# Patient Record
Sex: Female | Born: 1945 | Race: White | Hispanic: No | Smoking: Never smoker
Health system: Southern US, Community
[De-identification: ages and names within clinical notes are randomized; demographics above are authoritative.]

## PROBLEM LIST (undated history)

## (undated) DIAGNOSIS — M545 Low back pain, unspecified: Secondary | ICD-10-CM

## (undated) DIAGNOSIS — K219 Gastro-esophageal reflux disease without esophagitis: Secondary | ICD-10-CM

## (undated) DIAGNOSIS — M1990 Unspecified osteoarthritis, unspecified site: Secondary | ICD-10-CM

## (undated) DIAGNOSIS — F419 Anxiety disorder, unspecified: Secondary | ICD-10-CM

## (undated) DIAGNOSIS — F32A Depression, unspecified: Secondary | ICD-10-CM

## (undated) DIAGNOSIS — I499 Cardiac arrhythmia, unspecified: Secondary | ICD-10-CM

## (undated) DIAGNOSIS — G8929 Other chronic pain: Secondary | ICD-10-CM

## (undated) DIAGNOSIS — J189 Pneumonia, unspecified organism: Secondary | ICD-10-CM

## (undated) DIAGNOSIS — Z8489 Family history of other specified conditions: Secondary | ICD-10-CM

## (undated) DIAGNOSIS — E039 Hypothyroidism, unspecified: Secondary | ICD-10-CM

## (undated) DIAGNOSIS — I4891 Unspecified atrial fibrillation: Secondary | ICD-10-CM

## (undated) DIAGNOSIS — Z8719 Personal history of other diseases of the digestive system: Secondary | ICD-10-CM

## (undated) DIAGNOSIS — Z8711 Personal history of peptic ulcer disease: Secondary | ICD-10-CM

## (undated) DIAGNOSIS — I2510 Atherosclerotic heart disease of native coronary artery without angina pectoris: Secondary | ICD-10-CM

## (undated) DIAGNOSIS — R0600 Dyspnea, unspecified: Secondary | ICD-10-CM

## (undated) DIAGNOSIS — I10 Essential (primary) hypertension: Secondary | ICD-10-CM

## (undated) DIAGNOSIS — J42 Unspecified chronic bronchitis: Secondary | ICD-10-CM

## (undated) DIAGNOSIS — G43909 Migraine, unspecified, not intractable, without status migrainosus: Secondary | ICD-10-CM

## (undated) DIAGNOSIS — F329 Major depressive disorder, single episode, unspecified: Secondary | ICD-10-CM

## (undated) DIAGNOSIS — E7800 Pure hypercholesterolemia, unspecified: Secondary | ICD-10-CM

## (undated) HISTORY — PX: CARPAL TUNNEL RELEASE: SHX101

## (undated) HISTORY — PX: TUBAL LIGATION: SHX77

## (undated) HISTORY — PX: TONSILLECTOMY: SUR1361

## (undated) HISTORY — PX: KNEE ARTHROSCOPY: SHX127

## (undated) HISTORY — PX: ANTERIOR CERVICAL DECOMP/DISCECTOMY FUSION: SHX1161

## (undated) HISTORY — PX: LUMBAR DISC SURGERY: SHX700

## (undated) HISTORY — PX: BACK SURGERY: SHX140

## (undated) HISTORY — PX: BLEPHAROPLASTY: SUR158

## (undated) HISTORY — PX: CATARACT EXTRACTION, BILATERAL: SHX1313

---

## 2004-10-30 ENCOUNTER — Ambulatory Visit (HOSPITAL_COMMUNITY): Admission: RE | Admit: 2004-10-30 | Discharge: 2004-10-31 | Payer: Self-pay | Admitting: Neurosurgery

## 2005-01-31 ENCOUNTER — Encounter: Admission: RE | Admit: 2005-01-31 | Discharge: 2005-01-31 | Payer: Self-pay | Admitting: Neurosurgery

## 2005-02-23 ENCOUNTER — Encounter: Payer: Self-pay | Admitting: Neurosurgery

## 2005-02-23 ENCOUNTER — Ambulatory Visit (HOSPITAL_COMMUNITY): Admission: RE | Admit: 2005-02-23 | Discharge: 2005-02-24 | Payer: Self-pay | Admitting: Neurosurgery

## 2005-06-07 ENCOUNTER — Encounter: Admission: RE | Admit: 2005-06-07 | Discharge: 2005-06-07 | Payer: Self-pay | Admitting: Neurosurgery

## 2005-10-23 ENCOUNTER — Encounter: Admission: RE | Admit: 2005-10-23 | Discharge: 2005-10-23 | Payer: Self-pay | Admitting: Neurosurgery

## 2006-05-31 ENCOUNTER — Encounter: Admission: RE | Admit: 2006-05-31 | Discharge: 2006-05-31 | Payer: Self-pay | Admitting: Neurosurgery

## 2006-09-24 ENCOUNTER — Encounter: Admission: RE | Admit: 2006-09-24 | Discharge: 2006-09-24 | Payer: Self-pay | Admitting: Neurosurgery

## 2007-09-11 ENCOUNTER — Ambulatory Visit (HOSPITAL_COMMUNITY): Admission: RE | Admit: 2007-09-11 | Discharge: 2007-09-12 | Payer: Self-pay | Admitting: Neurosurgery

## 2008-01-20 ENCOUNTER — Encounter: Admission: RE | Admit: 2008-01-20 | Discharge: 2008-01-20 | Payer: Self-pay | Admitting: Neurosurgery

## 2010-07-04 NOTE — Op Note (Signed)
NAMEKAPRICE, KAGE                 ACCOUNT NO.:  [id number]   MEDICAL RECORD NO.:  [id number]          PATIENT TYPE:  INP   LOCATION:  3534                         FACILITY:  [facility]   PHYSICIAN:  Donalee Citrin, M.D.        DATE OF BIRTH:  10-29-1945   DATE OF PROCEDURE:  09/11/2007  DATE OF DISCHARGE:                               OPERATIVE REPORT   PREOPERATIVE DIAGNOSES:  Cervical spondylosis with spondylotic  radiculopathy C6-C7, right greater than left.   PROCEDURE:  Anterior cervical diskectomies and fusion at C5-C6, C6-C7  using 8-mm allograft wedge at C5-C6 and a 7-mm allograft wedge at C6-C7,  and a 40-mm Wenger plate with six 84-XL variable angle screws.   SURGEON:  Donalee Citrin, MD   ANESTHESIA:  General endotracheal.   INDICATIONS FOR PROCEDURE:  The patient is a very pleasant 65 year old  female who has had progressive worsening neck and predominantly right  arm and triceps pain has been refractory to all forms of conservative  treatment with physical therapy, antiinflammatories, and Prednisone over  the last several weeks and months.  MRI scan shows severe spondylitic  disease with spondylitic compression in spinal cord at C5-C6 and C6-C7.  The patient failed all conservative treatment.  MRI finding on physical  exam showed the patient to have weakness in her triceps.  The patient  was recommended cervical diskectomy and fusion.  Risks and benefits of  the operation were explained to the patient.  She understood and agreed  to proceed forward.   PROCEDURE IN DETAIL:  The patient was brought to the OR, was induced  under general anesthesia and positioned supine.  The neck was placed in  slight extension.  She was placed in the 5 pounds of Halter traction.  The right side of her neck was prepped and draped in the usual sterile  fashion.  Pressure localized at the appropriate level and a curvilinear  incision was made just off the midline to the anterior border of  sternocleidomastoid.  The superficial layer of the platysma was  dissected out and divided longitudinally.  The avascular plane at the  sternocleidomastoid and strap muscle was developed down to the  prevertebral fascia.  The prevertebral fascia was then dissected with  the Kitners.  Intraoperative x-ray confirmed localization at the  appropriate level.  There was a very large anterior osteophyte  immediately identified at the C5-C6 and C6-C7.  These  were bitten off  with a Leksell rongeur to contour the anterior margin of the vertebral  bodies.  Then, annulotomy with a 15-mm scalpel to mark the disk spaces,  longus colli was reflected laterally, and self-retaining retractor was  placed.  At this point, the anterior osteophytes were further bitten off  with the tooth,  high drill was used to drill down both interspaces  through the posterior annulus and osteophytic complex.  First, working  at C6-C7 under microscopic illumination, the posterior aspect was  further drilled down to the posterior annulus and longitudinal ligaments  using 1-mm Kerrison punch at undersurface of both C6 and C7.  The  vertebral bodies were underbitten further identifying the PLL, which was  teased away with a black nerve hook and removed in piecemeal fashion  with 1- and 2-mm Kerrison punch.  The thecal sac was then marked at  stenosis from a large posterior osteophyte displacing the spinal cord.  These were all very carefully underbitten across with both on the  inferior endplate of C6 and superior endplate of C7 marching across to  both C7 pedicle.  Both of these pedicles were then palpated.  Both the  C6-C7 neuroforamen were decompressed and both C7 nerve roots were widely  patent at the end of diskectomy.  After adequate decompression of the  endplates and underbiting had been achieved, the wound was packed with  Gelfoam and attention taken to C5-6.  In a similar fashion, the disk  space was drilled down to  the posterior annulus and again another  __________osteophyte was encountered in the posterior aspect of C6  vertebral body as well as C5 was then debrided aggressively.  The PLL  was removed in a piecemeal fashion decompressing the central canal and  over proximal C6 nerve root.  Both of these pedicles were identified.  Neuroforamen were decompressed and scored with angled nerve hook and  noted to be widely decompressed.  Then, after copious irrigation and  excellent hemostasis being maintained.  Endplates were scraped.  A size  8 graft was inserted at C5-C6 and size 7 graft was inserted at C6-C7.  A  40-mm Wenger plate was then placed.  All 6 variable angled screws were  placed, it had excellent purchase and locking mechanism was engaged.  The wound was then copiously irrigated, meticulous hemostasis was  maintained.  __________ Vicryl.  The skin was closed with a running 4-0  subcuticular.  Benzoin and Steri-strips were applied.  The patient went  to the recovery room in stable condition.  At the end of the case,  sponge, instruments, and needle counts were correct.           ______________________________  Donalee Citrin, M.D.     GC/MEDQ  D:  09/11/2007  T:  09/12/2007  Job:  [id number]

## 2010-07-07 NOTE — Op Note (Signed)
[name], [name]                 ACCOUNT NO.:  [id number]   MEDICAL RECORD NO.:  [id number]          PATIENT TYPE:  AMB   LOCATION:  SDS                          FACILITY:  [facility]   PHYSICIAN:  Donalee Citrin, M.D.        DATE OF BIRTH:  16-Dec-1945   DATE OF PROCEDURE:  10/30/2004  DATE OF DISCHARGE:                                 OPERATIVE REPORT   PREOPERATIVE DIAGNOSIS:  Right S1 radiculopathy from large ruptured disk, L5-  S1 right.   POSTOPERATIVE DIAGNOSIS:  Right S1 radiculopathy from large ruptured disk,  L5-S1 right.   OPERATION PERFORMED:  Lumbar laminectomy with microdiskectomy, L5-S1, right  with microscopic dissection of the right S1 nerve root and microscopic  diskectomy.   SURGEON:  Donalee Citrin, M.D.   ASSISTANT:  Kathaleen Maser. Pool, M.D.   ANESTHESIA:  General endotracheal.   INDICATIONS FOR PROCEDURE:  Patient is a very pleasant 65 year old female  who has had progressively worsening back and right leg pain radiating down  the outside of the foot and bottom of foot with numbness and tingling in the  same distribution.  The patient had weakness on plantar flexion  preoperatively and imaging showed large ruptured disk with free fragment  migrating caudally behind the S1 vertebral body compressing  the right S1  nerve root.  The patient was recommended laminectomy and microdiskectomy.  The risks and benefits were extensively discussed with the patient who  agreed to proceed forward.   DESCRIPTION OF PROCEDURE:  The patient was brought to the operating room and  was induced under general endotracheal anesthesia, placed prone on the  Wilson frame.  Back was prepped and draped in the usual sterile fashion.  Preop x-ray localized the L5-S1 disk space.  After infiltration of 10 mL  lidocaine with epinephrine, a midline incision was made.  Bovie  electrocautery was used to take down subcutaneous tissue.  Subperiosteal  dissection carried out lamina of L5 and S1 on the right  side.  Intraoperative x-ray confirmed localization of L5-S1 disk space and the  inferior aspect of lamina of L5 and medial facet complex and superior aspect  of S1 was removed in piecemeal fashion with 3 mm Kerrison punch.  Then the  ligament was noted to be markedly hypertrophied.  This was also removed in  piecemeal fashion exposing the thecal sac.  Then the operating microscope  was draped and brought in the field.  Under microscopic illumination,  __________ lateral thecal sac was identified and teased away from the  lateral ligament with a 4 Penfield and a blunt nerve root.  This was then  also underbitten with a 2 and 3 mm Kerrison punch and while I was  underbiting the lateral facet complex and lateral gutter, a small synovial  cyst was identified and removed in piecemeal fashion.  Then the S1 nerve  root was identified.  Then using a blunt nerve root and a 4 Penfield, it was  teased away and off of the disk space and free fragment was immediately  expressed and teased away with a blunt  nerve hook and pituitary rongeurs.  After the free fragment was removed, the disk space was identified. Epidural  veins coagulated.  Annulotomy was extended with 11 blade scalpel.  Pituitary  rongeurs were used to adequately clean out disk space using down going  Epstein curette, upbiting pituitaries and __________  Disk space was again  cleaned out both centrally and laterally.  Then the superior and inferior  aspects of the disk space were explored. No further stenosis or fragments  were appreciated. The lateral gutter was underbitten to create more of an  area for the S1 nerve root to rest.  Then the wound was copiously irrigated  and meticulous hemostasis was maintained.  Gelfoam was overlaid atop the  dura.  Muscle and fascia reapproximated in layers with interrupted  Vicryl.  __________  4-0 subcuticular placed on the skin. Benzoin and Steri-  Strips applied.  The patient was then transferred to  the recovery room in  stable condition. At end of case all sponge, needle and instrument counts  were correct.           ______________________________  Donalee Citrin, M.D.     GC/MEDQ  D:  10/30/2004  T:  10/30/2004  Job:  [id number]

## 2010-07-07 NOTE — Op Note (Signed)
Erin [name], SILER                 ACCOUNT NO.:  [id number]   MEDICAL RECORD NO.:  [id number]          PATIENT TYPE:  OIB   LOCATION:  3007                         FACILITY:  [facility]   PHYSICIAN:  Donalee Citrin, M.D.        DATE OF BIRTH:  October 05, 1945   DATE OF PROCEDURE:  02/23/2005  DATE OF DISCHARGE:  02/24/2005                                 OPERATIVE REPORT   PREOPERATIVE DIAGNOSIS:  Recurrent S1 radiculopathy from a large ruptured  disk at L5-S1 on the right.   PROCEDURE:  Redo right-sided L5-S1 laminectomy and microdiskectomy with  microdissection of the right S1 nerve root.   SURGEON:  Donalee Citrin, M.D.   ANESTHESIA:  General endotracheal.   HISTORY OF PRESENT ILLNESS:  The patient is a very pleasant 65 year old  female who underwent lumbar laminectomy approximately three months ago and  over the last few weeks has had recurrent severe right-sided recurrent  radiculopathy down the outside __________ and bottom of the foot with  numbness and tingling in the same distribution.  Preoperative x-ray showed a  very large recurrent disk herniation causing severe spinal stenosis on the  right side at L5-S1.  The patient was recommended a reexploration and redo  laminectomy and diskectomy, the risks, benefits and limitations, she  understands and agrees to proceed forward.   The patient was brought into the OR, was induced under general anesthesia,  was positioned prone and placed on the Wilson frame.  The back was prepped  and draped in the usual sterile fashion.  No preoperative x-ray was taken.  The old incision was opened up and Bovie electrocautery was used to dissect  through the scar tissue.  Subperiosteal dissection was carried out with the  scar tissue dissected off the residual lamina of the L5 and S1 disk space.  Intraoperative x-ray confirmed localization of the probe one disk space  above this at L4-5, so attention was taken to the scar tissue at the disk  space below.  The  plane underneath the L5 lamina was developed with a 4  Penfield.  Using a 2 and 3 mm Kerrison punch, some additional L5 laminectomy  was performed to identify normal dura.  Then the lateral facet complex was  dissected free with general dissectors and a 4 Penfield as well as the  superior aspect of the S1 lamina.  Again, the S1 laminotomy was extended and  the medial facetectomy was also extended.  This exposed normal dura at the  S1 level as well as the L5 level and then the operating microscope was  draped, brought in to provide __________ illumination and the S1 pedicle was  identified and the S1 nerve was identified and dissected off the pedicle.  A  very large disk recurrent disk herniation densely stuck to the undersurface  of the dura was identified and under microscopic illumination, the plane on  the undersurface of the dura was developed over this recurrent disk  herniation and was very difficult to dissect free, so the lateral disk space  was entered with an 11 blade scalpel  and pituitary rongeurs to get  underneath the disk space, underneath the scar tissue, and several large  fragments of disk were removed.  Several free fragments were dissected off  of the undersurface of the thecal sac at this time and the disk space was  cleaned out.  The undersurface of the S1 nerve root was cleaned out at the  end of the diskectomy and some scar tissue was freed up and the S1 nerve was  easily mobilizable and the thecal sac was well-decompressed.  The wound was  then copiously irrigated and meticulous hemostasis was maintained.  Gelfoam  was overload on top  of the dura.  The muscle and fascia were reapproximated with interrupted  Vicryls and the skin was closed with a running 4-0 subcuticular and Benzoin  and Steri-Strips applied.  The patient went to the recovery room in stable  condition.  At the end of the case all needle and sponge counts were  correct.            ______________________________  Donalee Citrin, M.D.     GC/MEDQ  D:  02/23/2005  T:  02/24/2005  Job:  [id number]

## 2010-11-17 LAB — CBC
HCT: 39
Hemoglobin: 13.5
MCHC: 34.5
MCV: 97.7
Platelets: 138 — ABNORMAL LOW
RBC: 4
RDW: 13.3
WBC: 4.3

## 2010-11-17 LAB — BASIC METABOLIC PANEL
BUN: 17
CO2: 30
Calcium: 9.1
Chloride: 102
Creatinine, Ser: 0.75
GFR calc Af Amer: >60
GFR calc non Af Amer: >60
Glucose, Bld: 85
Potassium: 3.8
Sodium: 138

## 2010-11-17 LAB — DIFFERENTIAL
Basophils Absolute: 0
Basophils Relative: 1
Eosinophils Absolute: 0.3
Eosinophils Relative: 7 — ABNORMAL HIGH
Lymphocytes Relative: 34
Lymphs Abs: 1.5
Monocytes Absolute: 0.5
Monocytes Relative: 11
Neutro Abs: 2.1
Neutrophils Relative %: 48

## 2016-04-18 ENCOUNTER — Other Ambulatory Visit: Payer: Self-pay | Admitting: Neurosurgery

## 2016-04-19 HISTORY — PX: POSTERIOR LUMBAR FUSION: SHX6036

## 2016-04-23 ENCOUNTER — Encounter (HOSPITAL_COMMUNITY)
Admission: RE | Admit: 2016-04-23 | Discharge: 2016-04-23 | Disposition: A | Discharge status: Home / Self Care | Payer: Medicare HMO | Source: Ambulatory Visit | Attending: Neurosurgery | Admitting: Neurosurgery

## 2016-04-23 ENCOUNTER — Encounter (HOSPITAL_COMMUNITY): Payer: Self-pay

## 2016-04-23 ENCOUNTER — Other Ambulatory Visit (HOSPITAL_COMMUNITY): Payer: Self-pay | Admitting: *Deleted

## 2016-04-23 HISTORY — DX: Hypothyroidism, unspecified: E03.9

## 2016-04-23 HISTORY — DX: Essential (primary) hypertension: I10

## 2016-04-23 HISTORY — DX: Gastro-esophageal reflux disease without esophagitis: K21.9

## 2016-04-23 LAB — CBC
HCT: 40.9 % (ref 36.0–46.0)
Hemoglobin: 13.7 g/dL (ref 12.0–15.0)
MCH: 29.1 pg (ref 26.0–34.0)
MCHC: 33.5 g/dL (ref 30.0–36.0)
MCV: 87 fL (ref 78.0–100.0)
Platelets: 178 10*3/uL (ref 150–400)
RBC: 4.7 MIL/uL (ref 3.87–5.11)
RDW: 13.8 % (ref 11.5–15.5)
WBC: 9.7 10*3/uL (ref 4.0–10.5)

## 2016-04-23 LAB — SURGICAL PCR SCREEN
MRSA, PCR: NEGATIVE
Staphylococcus aureus: POSITIVE — AB

## 2016-04-23 LAB — BASIC METABOLIC PANEL
Anion gap: 8 (ref 5–15)
BUN: 16 mg/dL (ref 6–20)
CO2: 31 mmol/L (ref 22–32)
Calcium: 9.9 mg/dL (ref 8.9–10.3)
Chloride: 94 mmol/L — ABNORMAL LOW (ref 101–111)
Creatinine, Ser: 0.77 mg/dL (ref 0.44–1.00)
GFR calc Af Amer: >60 mL/min (ref >60)
GFR calc non Af Amer: >60 mL/min (ref >60)
Glucose, Bld: 106 mg/dL — ABNORMAL HIGH (ref 65–99)
Potassium: 3.8 mmol/L (ref 3.5–5.1)
Sodium: 133 mmol/L — ABNORMAL LOW (ref 135–145)

## 2016-04-23 LAB — TYPE AND SCREEN
ABO/RH(D): O POS
Antibody Screen: NEGATIVE

## 2016-04-23 LAB — ABO/RH: ABO/RH(D): O POS

## 2016-04-23 NOTE — Pre-Procedure Instructions (Addendum)
Erin [name]  04/23/2016    Your procedure is scheduled on Wednesday, April 25, 2016    Report to [facility] Entrance "A" Admitting Office.   Call [phone] morning of surgery  between 7-8 am for time to arrive if dr has not instructed   Call this number if you have problems the morning of surgery: [phone]   Questions prior to day of surgery, please call [phone] between 8 & 4 PM.   Remember:  Do not eat food or drink liquids after midnight Tuesday, 04/24/16.  Take these medicines the morning of surgery with A SIP OF WATER: Levothyroxine (Synthroid), Rabeprazole (Aciphex) - if needed, Hydrocodone - if needed, may use eye drops  Stop Multivitamins/supplements as of today.also Do not use Aspirin products or NSAIDS (Ibuprofen, Aleve, etc.) starting NOW   Do not wear jewelry, make-up or nail polish.  Do not wear lotions, powders, perfumes or deodorant.  Do not shave 48 hours prior to surgery.    Do not bring valuables to the hospital.  [facility] is not responsible for any belongings or valuables.  Contacts, dentures or bridgework may not be worn into surgery.  Leave your suitcase in the car.  After surgery it may be brought to your room.  For patients admitted to the hospital, discharge time will be determined by your treatment team.  Special instructions:  [place] - Preparing for Surgery  Before surgery, you can play an important role.  Because skin is not sterile, your skin needs to be as free of germs as possible.  You can reduce the number of germs on you skin by washing with CHG (chlorahexidine gluconate) soap before surgery.  CHG is an antiseptic cleaner which kills germs and bonds with the skin to continue killing germs even after washing.  Please DO NOT use if you have an allergy to CHG or antibacterial soaps.  If your skin becomes reddened/irritated stop using the CHG and inform your nurse when you arrive at Short Stay.  Do not shave (including  legs and underarms) for at least 48 hours prior to the first CHG shower.  You may shave your face.  Please follow these instructions carefully:   1.  Shower with CHG Soap the night before surgery and the                    morning of Surgery.  2.  If you choose to wash your hair, wash your hair first as usual with your       normal shampoo.  3.  After you shampoo, rinse your hair and body thoroughly to remove the shampoo.  4.  Use CHG as you would any other liquid soap.  You can apply chg directly       to the skin and wash gently with scrungie or a clean washcloth.  5.  Apply the CHG Soap to your body ONLY FROM THE NECK DOWN.        Do not use on open wounds or open sores.  Avoid contact with your eyes, ears, mouth and genitals (private parts).  Wash genitals (private parts) with your normal soap.  6.  Wash thoroughly, paying special attention to the area where your surgery        will be performed.  7.  Thoroughly rinse your body with warm water from the neck down.  8.  DO NOT shower/wash with your normal soap after using and rinsing off  the CHG Soap.  9.  Pat yourself dry with a clean towel.            10.  Wear clean pajamas.            11.  Place clean sheets on your bed the night of your first shower and do not        sleep with pets.  Day of Surgery  Do not apply any lotions/deodorants the morning of surgery.  Please wear clean clothes to the hospital.   Please read over the fact sheets that you were given.

## 2016-04-24 NOTE — Progress Notes (Signed)
Anesthesia Chart Review: Patient is a 71 year old female scheduled for L4-5, L5-S1 PLIF on 04/25/16 by Dr. Wynetta Emery.  History includes never smoker, hypertension, GERD, migraines, hypothyroidism, cervical discectomy 2009, back surgery 2007, tonsillectomy. BMI is consistent with mild obesity.   PCP is listed as Dr. Caffie Damme at [facility].  BP (!) 132/96 Comment: taken manually Purnell Shoemaker RN  Pulse 89   Temp 36.5 C   Resp 20   Ht 5\' 3"  (1.6 m)   Wt 171 lb 11.2 oz (77.9 kg)   SpO2 96%   BMI 30.42 kg/m   Meds include Fioricet, levothyroxine, magnesium, quinapril, AcipHex, Zocor, Maxide-25.  EKG 04/23/16: SR with PACs, possible left atrial enlargement, inferior infarct, age undetermined. There is no comparison tracing in [employer] or [employer]. [facility] faxed over a tracing from 06/09/10, but if is dark and lead aVF is difficult to read, otherwise I don't think the other leads have significantly changes. PACs are new.   Nuclear stress test 12/20/11 [facility]): Impression: Myocardial perfusion imaging is normal. Overall left ventricular systolic function was normal without regional wall motion abnormalities. The LVEF was 71%. Resting ECG SR, incomplete RBBB.  Echo 01/10/2012 [facility]): Conclusion: Normal study. Normal cardiac chamber sizes and function. LVEF 65-70%. Normal valve anatomy and function. No pericardial effusion or intracardiac mass. Normal thoracic aorta and aortic arch.   Preoperative labs noted. Cr 0.77. CBC WNL. Glucose 106. T&S done.  No CV symptoms documented at PAT Unremarkable cardiac testing in late 2013. No known CAD/MI/CHF history. No known DM. If no acute changes then I anticipate that she can proceed as planned. Discussed with anesthesiologist Dr. Aleene Davidson.  Velna Ochs [facility] Short Stay Center/Anesthesiology Phone [phone] 04/24/2016 2:08 PM

## 2016-04-25 ENCOUNTER — Encounter (HOSPITAL_COMMUNITY)
Admission: RE | Disposition: A | Discharge status: Home Health | Payer: Self-pay | Source: Ambulatory Visit | Attending: Neurosurgery

## 2016-04-25 ENCOUNTER — Inpatient Hospital Stay (HOSPITAL_COMMUNITY): Payer: Medicare HMO | Admitting: Vascular Surgery

## 2016-04-25 ENCOUNTER — Encounter (HOSPITAL_COMMUNITY): Payer: Self-pay | Admitting: *Deleted

## 2016-04-25 ENCOUNTER — Inpatient Hospital Stay (HOSPITAL_COMMUNITY)
Admission: RE | Admit: 2016-04-25 | Discharge: 2016-04-27 | DRG: 455 | Disposition: A | Discharge status: Home Health | Payer: Medicare HMO | Source: Ambulatory Visit | Attending: Neurosurgery | Admitting: Neurosurgery

## 2016-04-25 ENCOUNTER — Inpatient Hospital Stay (HOSPITAL_COMMUNITY): Payer: Medicare HMO | Admitting: Certified Registered"

## 2016-04-25 ENCOUNTER — Inpatient Hospital Stay (HOSPITAL_COMMUNITY): Payer: Medicare HMO

## 2016-04-25 DIAGNOSIS — K219 Gastro-esophageal reflux disease without esophagitis: Secondary | ICD-10-CM | POA: Diagnosis present

## 2016-04-25 DIAGNOSIS — M48062 Spinal stenosis, lumbar region with neurogenic claudication: Secondary | ICD-10-CM | POA: Diagnosis present

## 2016-04-25 DIAGNOSIS — R402143 Coma scale, eyes open, spontaneous, at hospital admission: Secondary | ICD-10-CM | POA: Diagnosis present

## 2016-04-25 DIAGNOSIS — M4807 Spinal stenosis, lumbosacral region: Secondary | ICD-10-CM | POA: Diagnosis present

## 2016-04-25 DIAGNOSIS — I10 Essential (primary) hypertension: Secondary | ICD-10-CM | POA: Diagnosis present

## 2016-04-25 DIAGNOSIS — M549 Dorsalgia, unspecified: Secondary | ICD-10-CM | POA: Diagnosis present

## 2016-04-25 DIAGNOSIS — R402253 Coma scale, best verbal response, oriented, at hospital admission: Secondary | ICD-10-CM | POA: Diagnosis present

## 2016-04-25 DIAGNOSIS — R402363 Coma scale, best motor response, obeys commands, at hospital admission: Secondary | ICD-10-CM | POA: Diagnosis present

## 2016-04-25 DIAGNOSIS — Z885 Allergy status to narcotic agent status: Secondary | ICD-10-CM | POA: Diagnosis not present

## 2016-04-25 DIAGNOSIS — M48061 Spinal stenosis, lumbar region without neurogenic claudication: Secondary | ICD-10-CM | POA: Diagnosis present

## 2016-04-25 DIAGNOSIS — E039 Hypothyroidism, unspecified: Secondary | ICD-10-CM | POA: Diagnosis present

## 2016-04-25 DIAGNOSIS — M5136 Other intervertebral disc degeneration, lumbar region: Secondary | ICD-10-CM | POA: Diagnosis present

## 2016-04-25 DIAGNOSIS — M5127 Other intervertebral disc displacement, lumbosacral region: Secondary | ICD-10-CM | POA: Diagnosis present

## 2016-04-25 DIAGNOSIS — Z419 Encounter for procedure for purposes other than remedying health state, unspecified: Secondary | ICD-10-CM

## 2016-04-25 SURGERY — POSTERIOR LUMBAR FUSION 2 LEVEL
Anesthesia: General | Site: Back

## 2016-04-25 MED ORDER — FENTANYL CITRATE (PF) 100 MCG/2ML IJ SOLN
INTRAMUSCULAR | Status: AC
  Filled 2016-04-25: qty 4

## 2016-04-25 MED ORDER — PROPOFOL 10 MG/ML IV BOLUS
INTRAVENOUS | Status: DC | PRN
  Administered 2016-04-25: 150 mg via INTRAVENOUS

## 2016-04-25 MED ORDER — LIDOCAINE-EPINEPHRINE 2 %-1:100000 IJ SOLN
INTRAMUSCULAR | Status: DC | PRN
  Administered 2016-04-25: 10 mL via INTRADERMAL

## 2016-04-25 MED ORDER — LIDOCAINE-EPINEPHRINE 2 %-1:100000 IJ SOLN
INTRAMUSCULAR | Status: AC
  Filled 2016-04-25: qty 1

## 2016-04-25 MED ORDER — DOCUSATE SODIUM 100 MG PO CAPS
100.0000 mg | ORAL_CAPSULE | Freq: Two times a day (BID) | ORAL | Status: DC
  Administered 2016-04-25 – 2016-04-27 (×4): 100 mg via ORAL
  Filled 2016-04-25 (×4): qty 1

## 2016-04-25 MED ORDER — ONDANSETRON HCL 4 MG/2ML IJ SOLN: 4.0000 mg | Freq: Once | INTRAMUSCULAR | Status: DC | PRN

## 2016-04-25 MED ORDER — LIDOCAINE 2% (20 MG/ML) 5 ML SYRINGE
INTRAMUSCULAR | Status: DC | PRN
  Administered 2016-04-25: 60 mg via INTRAVENOUS

## 2016-04-25 MED ORDER — TRIAMTERENE-HCTZ 37.5-25 MG PO TABS
1.0000 | ORAL_TABLET | Freq: Every day | ORAL | Status: DC
  Administered 2016-04-25 – 2016-04-27 (×3): 1 via ORAL
  Filled 2016-04-25 (×3): qty 1

## 2016-04-25 MED ORDER — PANTOPRAZOLE SODIUM 40 MG IV SOLR
40.0000 mg | Freq: Every day | INTRAVENOUS | Status: DC
  Administered 2016-04-25: 40 mg via INTRAVENOUS
  Filled 2016-04-25: qty 40

## 2016-04-25 MED ORDER — BUTALBITAL-APAP-CAFFEINE 50-325-40 MG PO TABS: 1.0000 | ORAL_TABLET | Freq: Two times a day (BID) | ORAL | Status: DC | PRN

## 2016-04-25 MED ORDER — PHENOL 1.4 % MT LIQD: 1.0000 | OROMUCOSAL | Status: DC | PRN

## 2016-04-25 MED ORDER — PROPOFOL 10 MG/ML IV BOLUS
INTRAVENOUS | Status: AC
  Filled 2016-04-25: qty 20

## 2016-04-25 MED ORDER — CEFAZOLIN SODIUM 1 G IJ SOLR
INTRAMUSCULAR | Status: AC
  Filled 2016-04-25: qty 20

## 2016-04-25 MED ORDER — ONDANSETRON HCL 4 MG/2ML IJ SOLN
INTRAMUSCULAR | Status: DC | PRN
  Administered 2016-04-25: 4 mg via INTRAVENOUS

## 2016-04-25 MED ORDER — MENTHOL 3 MG MT LOZG: 1.0000 | LOZENGE | OROMUCOSAL | Status: DC | PRN

## 2016-04-25 MED ORDER — THROMBIN 5000 UNITS EX SOLR
CUTANEOUS | Status: AC
  Filled 2016-04-25: qty 5000

## 2016-04-25 MED ORDER — FENTANYL CITRATE (PF) 100 MCG/2ML IJ SOLN
25.0000 ug | INTRAMUSCULAR | Status: DC | PRN
  Administered 2016-04-25 (×5): 25 ug via INTRAVENOUS

## 2016-04-25 MED ORDER — BUPIVACAINE LIPOSOME 1.3 % IJ SUSP
20.0000 mL | INTRAMUSCULAR | Status: AC
  Administered 2016-04-25: 20 mL
  Filled 2016-04-25: qty 20

## 2016-04-25 MED ORDER — SODIUM CHLORIDE 0.9% FLUSH: 3.0000 mL | INTRAVENOUS | Status: DC | PRN

## 2016-04-25 MED ORDER — MIDAZOLAM HCL 2 MG/2ML IJ SOLN
INTRAMUSCULAR | Status: AC
  Filled 2016-04-25: qty 2

## 2016-04-25 MED ORDER — CYCLOSPORINE 0.05 % OP EMUL
1.0000 [drp] | Freq: Two times a day (BID) | OPHTHALMIC | Status: DC
  Administered 2016-04-25 – 2016-04-27 (×4): 1 [drp] via OPHTHALMIC
  Filled 2016-04-25 (×5): qty 1

## 2016-04-25 MED ORDER — HYDROMORPHONE HCL 1 MG/ML IJ SOLN
1.0000 mg | INTRAMUSCULAR | Status: DC | PRN
  Administered 2016-04-25 – 2016-04-26 (×6): 1 mg via INTRAVENOUS
  Filled 2016-04-25 (×6): qty 1

## 2016-04-25 MED ORDER — SODIUM CHLORIDE 0.9 % IR SOLN
Status: DC | PRN
  Administered 2016-04-25: 14:00:00

## 2016-04-25 MED ORDER — SODIUM CHLORIDE 0.9% FLUSH
3.0000 mL | Freq: Two times a day (BID) | INTRAVENOUS | Status: DC
  Administered 2016-04-26 (×2): 3 mL via INTRAVENOUS

## 2016-04-25 MED ORDER — PHENYLEPHRINE 40 MCG/ML (10ML) SYRINGE FOR IV PUSH (FOR BLOOD PRESSURE SUPPORT)
PREFILLED_SYRINGE | INTRAVENOUS | Status: AC
  Filled 2016-04-25: qty 10

## 2016-04-25 MED ORDER — HYDROCODONE-ACETAMINOPHEN 10-325 MG PO TABS: 1.0000 | ORAL_TABLET | Freq: Four times a day (QID) | ORAL | Status: DC | PRN

## 2016-04-25 MED ORDER — MIDAZOLAM HCL 5 MG/5ML IJ SOLN
INTRAMUSCULAR | Status: DC | PRN
  Administered 2016-04-25: 2 mg via INTRAVENOUS

## 2016-04-25 MED ORDER — ACETAMINOPHEN 325 MG PO TABS
650.0000 mg | ORAL_TABLET | ORAL | Status: DC | PRN
  Administered 2016-04-27: 650 mg via ORAL
  Filled 2016-04-25: qty 2

## 2016-04-25 MED ORDER — SODIUM CHLORIDE 0.9 % IV SOLN
250.0000 mL | INTRAVENOUS | Status: DC
  Administered 2016-04-27: 250 mL via INTRAVENOUS

## 2016-04-25 MED ORDER — CEFAZOLIN SODIUM 1 G IJ SOLR
INTRAMUSCULAR | Status: DC | PRN
  Administered 2016-04-25 (×2): 2 g via INTRAMUSCULAR

## 2016-04-25 MED ORDER — LISINOPRIL 5 MG PO TABS
5.0000 mg | ORAL_TABLET | Freq: Every day | ORAL | Status: DC
  Administered 2016-04-25 – 2016-04-27 (×3): 5 mg via ORAL
  Filled 2016-04-25 (×3): qty 1

## 2016-04-25 MED ORDER — VITAMIN D 50 MCG (2000 UT) PO CAPS: 2000.0000 [IU] | ORAL_CAPSULE | Freq: Every day | ORAL | Status: DC

## 2016-04-25 MED ORDER — PHENYLEPHRINE HCL 10 MG/ML IJ SOLN
INTRAVENOUS | Status: DC | PRN
  Administered 2016-04-25: 50 ug/min via INTRAVENOUS

## 2016-04-25 MED ORDER — VITAMIN D 1000 UNITS PO TABS
2000.0000 [IU] | ORAL_TABLET | Freq: Every day | ORAL | Status: DC
  Administered 2016-04-26 – 2016-04-27 (×2): 2000 [IU] via ORAL
  Filled 2016-04-25 (×2): qty 2

## 2016-04-25 MED ORDER — ALUM & MAG HYDROXIDE-SIMETH 200-200-20 MG/5ML PO SUSP: 30.0000 mL | Freq: Four times a day (QID) | ORAL | Status: DC | PRN

## 2016-04-25 MED ORDER — ONDANSETRON HCL 4 MG/2ML IJ SOLN
4.0000 mg | Freq: Four times a day (QID) | INTRAMUSCULAR | Status: DC | PRN
  Administered 2016-04-26: 4 mg via INTRAVENOUS
  Filled 2016-04-25: qty 2

## 2016-04-25 MED ORDER — EPHEDRINE 5 MG/ML INJ
INTRAVENOUS | Status: AC
  Filled 2016-04-25: qty 10

## 2016-04-25 MED ORDER — MAGNESIUM 250 MG PO TABS: 250.0000 mg | ORAL_TABLET | Freq: Every day | ORAL | Status: DC

## 2016-04-25 MED ORDER — LEVOTHYROXINE SODIUM 112 MCG PO TABS
112.0000 ug | ORAL_TABLET | Freq: Every day | ORAL | Status: DC
  Administered 2016-04-26 – 2016-04-27 (×2): 112 ug via ORAL
  Filled 2016-04-25 (×2): qty 1

## 2016-04-25 MED ORDER — FENTANYL CITRATE (PF) 100 MCG/2ML IJ SOLN
50.0000 ug | INTRAMUSCULAR | Status: DC | PRN
  Administered 2016-04-25 (×2): 50 ug via INTRAVENOUS
  Filled 2016-04-25: qty 2

## 2016-04-25 MED ORDER — ONDANSETRON HCL 4 MG/2ML IJ SOLN
INTRAMUSCULAR | Status: AC
  Filled 2016-04-25: qty 2

## 2016-04-25 MED ORDER — FENTANYL CITRATE (PF) 100 MCG/2ML IJ SOLN
INTRAMUSCULAR | Status: AC
  Filled 2016-04-25: qty 2

## 2016-04-25 MED ORDER — OXYCODONE HCL 5 MG PO TABS
5.0000 mg | ORAL_TABLET | ORAL | Status: DC | PRN
  Administered 2016-04-25: 5 mg via ORAL
  Administered 2016-04-26 (×2): 10 mg via ORAL
  Filled 2016-04-25: qty 2
  Filled 2016-04-25: qty 1
  Filled 2016-04-25: qty 2

## 2016-04-25 MED ORDER — MAGNESIUM OXIDE 400 (241.3 MG) MG PO TABS
200.0000 mg | ORAL_TABLET | Freq: Every day | ORAL | Status: DC
  Administered 2016-04-25 – 2016-04-27 (×3): 200 mg via ORAL
  Filled 2016-04-25 (×3): qty 1

## 2016-04-25 MED ORDER — MIDAZOLAM HCL 2 MG/2ML IJ SOLN
0.5000 mg | INTRAMUSCULAR | Status: DC | PRN
  Administered 2016-04-25 (×2): 1 mg via INTRAVENOUS
  Filled 2016-04-25: qty 2

## 2016-04-25 MED ORDER — CEFAZOLIN SODIUM-DEXTROSE 2-4 GM/100ML-% IV SOLN
2.0000 g | Freq: Three times a day (TID) | INTRAVENOUS | Status: DC
  Administered 2016-04-25 – 2016-04-27 (×6): 2 g via INTRAVENOUS
  Filled 2016-04-25 (×8): qty 100

## 2016-04-25 MED ORDER — ROCURONIUM BROMIDE 50 MG/5ML IV SOSY
PREFILLED_SYRINGE | INTRAVENOUS | Status: AC
  Filled 2016-04-25: qty 5

## 2016-04-25 MED ORDER — SUGAMMADEX SODIUM 200 MG/2ML IV SOLN
INTRAVENOUS | Status: DC | PRN
  Administered 2016-04-25: 100 mg via INTRAVENOUS

## 2016-04-25 MED ORDER — VANCOMYCIN HCL 1000 MG IV SOLR
INTRAVENOUS | Status: DC | PRN
  Administered 2016-04-25: 1000 mg via TOPICAL

## 2016-04-25 MED ORDER — THROMBIN 20000 UNITS EX SOLR
CUTANEOUS | Status: AC
  Filled 2016-04-25: qty 20000

## 2016-04-25 MED ORDER — 0.9 % SODIUM CHLORIDE (POUR BTL) OPTIME
TOPICAL | Status: DC | PRN
  Administered 2016-04-25: 1000 mL

## 2016-04-25 MED ORDER — SUGAMMADEX SODIUM 500 MG/5ML IV SOLN
INTRAVENOUS | Status: AC
  Filled 2016-04-25: qty 5

## 2016-04-25 MED ORDER — PANTOPRAZOLE SODIUM 40 MG PO TBEC: 40.0000 mg | DELAYED_RELEASE_TABLET | Freq: Every day | ORAL | Status: DC

## 2016-04-25 MED ORDER — ACETAMINOPHEN 650 MG RE SUPP: 650.0000 mg | RECTAL | Status: DC | PRN

## 2016-04-25 MED ORDER — FENTANYL CITRATE (PF) 100 MCG/2ML IJ SOLN
INTRAMUSCULAR | Status: DC | PRN
Start: 2016-04-25 — End: 2016-04-25
  Administered 2016-04-25 (×2): 50 ug via INTRAVENOUS
  Administered 2016-04-25 (×2): 100 ug via INTRAVENOUS

## 2016-04-25 MED ORDER — ADULT MULTIVITAMIN W/MINERALS CH
1.0000 | ORAL_TABLET | Freq: Every day | ORAL | Status: DC
  Administered 2016-04-26 – 2016-04-27 (×2): 1 via ORAL
  Filled 2016-04-25 (×3): qty 1

## 2016-04-25 MED ORDER — THROMBIN 20000 UNITS EX SOLR
CUTANEOUS | Status: DC | PRN
  Administered 2016-04-25 (×2): via TOPICAL

## 2016-04-25 MED ORDER — ROCURONIUM BROMIDE 10 MG/ML (PF) SYRINGE
PREFILLED_SYRINGE | INTRAVENOUS | Status: DC | PRN
  Administered 2016-04-25: 50 mg via INTRAVENOUS
  Administered 2016-04-25: 20 mg via INTRAVENOUS

## 2016-04-25 MED ORDER — SIMVASTATIN 40 MG PO TABS
40.0000 mg | ORAL_TABLET | Freq: Every day | ORAL | Status: DC
  Administered 2016-04-25 – 2016-04-26 (×2): 40 mg via ORAL
  Filled 2016-04-25 (×2): qty 1

## 2016-04-25 MED ORDER — CYCLOBENZAPRINE HCL 10 MG PO TABS
10.0000 mg | ORAL_TABLET | Freq: Three times a day (TID) | ORAL | Status: DC | PRN
  Administered 2016-04-25 – 2016-04-26 (×2): 10 mg via ORAL
  Filled 2016-04-25 (×2): qty 1

## 2016-04-25 MED ORDER — LACTATED RINGERS IV SOLN
INTRAVENOUS | Status: DC
  Administered 2016-04-25 (×3): via INTRAVENOUS

## 2016-04-25 MED ORDER — LIDOCAINE 2% (20 MG/ML) 5 ML SYRINGE
INTRAMUSCULAR | Status: AC
  Filled 2016-04-25: qty 5

## 2016-04-25 MED ORDER — VANCOMYCIN HCL 1000 MG IV SOLR
INTRAVENOUS | Status: AC
  Filled 2016-04-25: qty 1000

## 2016-04-25 MED ORDER — BUPIVACAINE-EPINEPHRINE (PF) 0.5% -1:200000 IJ SOLN
INTRAMUSCULAR | Status: AC
  Filled 2016-04-25: qty 30

## 2016-04-25 MED ORDER — ONDANSETRON HCL 4 MG PO TABS: 4.0000 mg | ORAL_TABLET | Freq: Four times a day (QID) | ORAL | Status: DC | PRN

## 2016-04-25 SURGICAL SUPPLY — 85 items
ADH SKN CLS APL DERMABOND .7 (GAUZE/BANDAGES/DRESSINGS) ×1
APL SKNCLS STERI-STRIP NONHPOA (GAUZE/BANDAGES/DRESSINGS) ×1
BAG DECANTER FOR FLEXI CONT (MISCELLANEOUS) ×3 IMPLANT
BENZOIN TINCTURE PRP APPL 2/3 (GAUZE/BANDAGES/DRESSINGS) ×3 IMPLANT
BLADE CLIPPER SURG (BLADE) IMPLANT
BLADE SURG 11 STRL SS (BLADE) ×3 IMPLANT
BONE VIVIGEN FORMABLE 5.4CC (Bone Implant) ×3 IMPLANT
BUR CUTTER 7.0 ROUND (BURR) ×3 IMPLANT
BUR MATCHSTICK NEURO 3.0 LAGG (BURR) ×3 IMPLANT
CAGE RISE 11-17-15 10X22 (Cage) ×4 IMPLANT
CANISTER SUCT 3000ML PPV (MISCELLANEOUS) ×3 IMPLANT
CAP LOCKING THREADED (Cap) ×12 IMPLANT
CARTRIDGE OIL MAESTRO DRILL (MISCELLANEOUS) ×1 IMPLANT
CLOSURE WOUND 1/2 X4 (GAUZE/BANDAGES/DRESSINGS) ×1
CONT SPEC 4OZ CLIKSEAL STRL BL (MISCELLANEOUS) ×3 IMPLANT
COVER BACK TABLE 60X90IN (DRAPES) ×3 IMPLANT
CROSSLINK SPINAL FUSION (Cage) ×2 IMPLANT
DECANTER SPIKE VIAL GLASS SM (MISCELLANEOUS) ×3 IMPLANT
DERMABOND ADVANCED (GAUZE/BANDAGES/DRESSINGS) ×2
DERMABOND ADVANCED .7 DNX12 (GAUZE/BANDAGES/DRESSINGS) ×1 IMPLANT
DIFFUSER DRILL AIR PNEUMATIC (MISCELLANEOUS) ×5 IMPLANT
DRAPE C-ARM 42X72 X-RAY (DRAPES) ×3 IMPLANT
DRAPE C-ARMOR (DRAPES) ×3 IMPLANT
DRAPE HALF SHEET 40X57 (DRAPES) IMPLANT
DRAPE LAPAROTOMY 100X72X124 (DRAPES) ×3 IMPLANT
DRAPE POUCH INSTRU U-SHP 10X18 (DRAPES) ×3 IMPLANT
DRAPE SURG 17X23 STRL (DRAPES) ×3 IMPLANT
DRSG OPSITE POSTOP 3X4 (GAUZE/BANDAGES/DRESSINGS) ×2 IMPLANT
DRSG OPSITE POSTOP 4X8 (GAUZE/BANDAGES/DRESSINGS) ×2 IMPLANT
DURAPREP 26ML APPLICATOR (WOUND CARE) ×3 IMPLANT
ELECT BLADE 4.0 EZ CLEAN MEGAD (MISCELLANEOUS) ×3
ELECT REM PT RETURN 9FT ADLT (ELECTROSURGICAL) ×3
ELECTRODE BLDE 4.0 EZ CLN MEGD (MISCELLANEOUS) IMPLANT
ELECTRODE REM PT RTRN 9FT ADLT (ELECTROSURGICAL) ×1 IMPLANT
EVACUATOR 3/16  PVC DRAIN (DRAIN) ×2
EVACUATOR 3/16 PVC DRAIN (DRAIN) ×1 IMPLANT
GAUZE SPONGE 4X4 12PLY STRL (GAUZE/BANDAGES/DRESSINGS) ×3 IMPLANT
GAUZE SPONGE 4X4 16PLY XRAY LF (GAUZE/BANDAGES/DRESSINGS) ×2 IMPLANT
GLOVE BIO SURGEON STRL SZ8 (GLOVE) ×8 IMPLANT
GLOVE BIO SURGEON STRL SZ8.5 (GLOVE) ×2 IMPLANT
GLOVE BIOGEL PI IND STRL 7.0 (GLOVE) IMPLANT
GLOVE BIOGEL PI INDICATOR 7.0 (GLOVE) ×6
GLOVE ECLIPSE 7.0 STRL STRAW (GLOVE) ×2 IMPLANT
GLOVE EXAM NITRILE LRG STRL (GLOVE) IMPLANT
GLOVE EXAM NITRILE XL STR (GLOVE) IMPLANT
GLOVE EXAM NITRILE XS STR PU (GLOVE) IMPLANT
GLOVE INDICATOR 8.5 STRL (GLOVE) ×6 IMPLANT
GOWN STRL REUS W/ TWL LRG LVL3 (GOWN DISPOSABLE) IMPLANT
GOWN STRL REUS W/ TWL XL LVL3 (GOWN DISPOSABLE) ×2 IMPLANT
GOWN STRL REUS W/TWL 2XL LVL3 (GOWN DISPOSABLE) IMPLANT
GOWN STRL REUS W/TWL LRG LVL3 (GOWN DISPOSABLE) ×6
GOWN STRL REUS W/TWL XL LVL3 (GOWN DISPOSABLE) ×6
GRAFT BNE MATRIX VG FRMBL MD 5 (Bone Implant) IMPLANT
IMPL SPINE RISE 8-15-10-26M (Neuro Prosthesis/Implant) IMPLANT
IMPLANT SPINE RISE 8-15-10-26M (Neuro Prosthesis/Implant) ×6 IMPLANT
KIT BASIN OR (CUSTOM PROCEDURE TRAY) ×3 IMPLANT
KIT INFUSE SMALL (Orthopedic Implant) ×2 IMPLANT
KIT ROOM TURNOVER OR (KITS) ×3 IMPLANT
NDL HYPO 21X1.5 SAFETY (NEEDLE) ×1 IMPLANT
NDL HYPO 25X1 1.5 SAFETY (NEEDLE) ×1 IMPLANT
NEEDLE HYPO 21X1.5 SAFETY (NEEDLE) ×3 IMPLANT
NEEDLE HYPO 25X1 1.5 SAFETY (NEEDLE) ×3 IMPLANT
NS IRRIG 1000ML POUR BTL (IV SOLUTION) ×3 IMPLANT
OIL CARTRIDGE MAESTRO DRILL (MISCELLANEOUS) ×6
PACK LAMINECTOMY NEURO (CUSTOM PROCEDURE TRAY) ×3 IMPLANT
PAD ARMBOARD 7.5X6 YLW CONV (MISCELLANEOUS) ×9 IMPLANT
ROD 65MM SPINAL (Rod) ×4 IMPLANT
ROD SPNL 5.5 CREO TI 65 (Rod) IMPLANT
SCREW AMP MODULAR CREO 6.5X45 (Screw) ×4 IMPLANT
SCREW CREO 6.5X40 (Screw) ×8 IMPLANT
SCREW PA THRD CREO TULIP 5.5X4 (Head) ×12 IMPLANT
SPONGE LAP 4X18 X RAY DECT (DISPOSABLE) IMPLANT
SPONGE SURGIFOAM ABS GEL 100 (HEMOSTASIS) ×5 IMPLANT
STRIP BIOACTIVE 20CC 25X100X8 (Miscellaneous) ×2 IMPLANT
STRIP CLOSURE SKIN 1/2X4 (GAUZE/BANDAGES/DRESSINGS) ×3 IMPLANT
SUT VIC AB 0 CT1 18XCR BRD8 (SUTURE) ×1 IMPLANT
SUT VIC AB 0 CT1 8-18 (SUTURE) ×3
SUT VIC AB 2-0 CT1 18 (SUTURE) ×3 IMPLANT
SUT VIC AB 4-0 PS2 27 (SUTURE) ×3 IMPLANT
SYR 20CC LL (SYRINGE) ×3 IMPLANT
SYR CONTROL 10ML LL (SYRINGE) ×2 IMPLANT
TOWEL GREEN STERILE (TOWEL DISPOSABLE) ×2 IMPLANT
TOWEL GREEN STERILE FF (TOWEL DISPOSABLE) ×3 IMPLANT
TRAY FOLEY W/METER SILVER 16FR (SET/KITS/TRAYS/PACK) ×3 IMPLANT
WATER STERILE IRR 1000ML POUR (IV SOLUTION) ×3 IMPLANT

## 2016-04-25 NOTE — Progress Notes (Signed)
Pt requesting pain meds, understands she will not be able to sign consent once pain/sedation meds received.  Pt requesting that son, Tawanna Cooler, will sign consent for her so she can receive pain meds.  Pt does verbalize understanding of surgical procedure at this time, awaiting for Dr. Wynetta Emery to sign orders.

## 2016-04-25 NOTE — Anesthesia Preprocedure Evaluation (Signed)
Anesthesia Evaluation  Patient identified by MRN, date of birth, ID band Patient awake    Reviewed: Allergy & Precautions, NPO status , Patient's Chart, lab work & pertinent test results  Airway Mallampati: II  TM Distance: >3 FB Neck ROM: Full    Dental  (+) Teeth Intact, Dental Advisory Given   Pulmonary    breath sounds clear to auscultation       Cardiovascular hypertension,  Rhythm:Regular Rate:Normal     Neuro/Psych    GI/Hepatic   Endo/Other    Renal/GU      Musculoskeletal   Abdominal   Peds  Hematology   Anesthesia Other Findings   Reproductive/Obstetrics                             Anesthesia Physical Anesthesia Plan  ASA: III  Anesthesia Plan: General   Post-op Pain Management:    Induction: Intravenous  Airway Management Planned: Oral ETT  Additional Equipment:   Intra-op Plan:   Post-operative Plan: Extubation in OR  Informed Consent: I have reviewed the patients History and Physical, chart, labs and discussed the procedure including the risks, benefits and alternatives for the proposed anesthesia with the patient or authorized representative who has indicated his/her understanding and acceptance.   Dental advisory given  Plan Discussed with: CRNA and Anesthesiologist  Anesthesia Plan Comments:         Anesthesia Quick Evaluation  

## 2016-04-25 NOTE — Progress Notes (Signed)
Dr Wynetta Emery in to see pt. Ms Krizan is able to verbalize type of surgery. Son at bedside is signing consent with pt voicing ok for him to sign.

## 2016-04-25 NOTE — Transfer of Care (Signed)
Immediate Anesthesia Transfer of Care Note  Patient: Erin [name]  Procedure(s) Performed: Procedure(s) with comments: POSTERIOR LUMBAR INTERBODY FUSION - LUMBAR FOUR-LUMBAR FOUR - LUMBAR FIVE-SACRAL ONE (N/A) - POSTERIOR LUMBAR INTERBODY FUSION - LUMBAR FOUR-LUMBAR FOUR - LUMBAR FIVE-SACRAL ONE  Patient Location: PACU  Anesthesia Type:General  Level of Consciousness: awake, oriented and patient cooperative  Airway & Oxygen Therapy: Patient Spontanous Breathing and Patient connected to nasal cannula oxygen  Post-op Assessment: Report given to RN, Post -op Vital signs reviewed and stable and Patient moving all extremities  Post vital signs: Reviewed and stable  Last Vitals:  Vitals:   04/25/16 1255 04/25/16 1300  BP:  (!) 167/90  Pulse: 90 96  Resp: 19 17  Temp:      Last Pain:  Vitals:   04/25/16 1300  TempSrc:   PainSc: 7       Patients Stated Pain Goal: 5 (04/25/16 1222)  Complications: No apparent anesthesia complications

## 2016-04-25 NOTE — H&P (Signed)
Erin [name] is an 71 y.o. female.   Chief Complaint: Back pain Bilateral leg pain HPI: Patient is very pleasant 71 year old female seen back and bilateral leg pain with neurogenic claudication. Scattered progressively worse over the last several weeks and months. Radius in both legs only able to walk very short distances. Workup revealed severe spinal stenosis at L4-5 and L5-S1 due to patient's failure conservative treatment imaging findings and progression of clinical syndrome I recommended decompressive laminectomy and fusion L4-5 L5-S1. I extensively reviewed the risks and benefits of the operation with the patient as well as perioperative course expectations of outcome and alternatives of surgery and she understands and agrees to proceed forward.  Past Medical History:  Diagnosis Date  . GERD (gastroesophageal reflux disease)    occ  . Headache    migraines hx  . Hypertension   . Hypothyroidism     Past Surgical History:  Procedure Laterality Date  . BACK SURGERY  07/2005   x2  . CARPAL TUNNEL RELEASE Bilateral 80's  . CERVICAL DISCECTOMY  2009  . TONSILLECTOMY      History reviewed. No pertinent family history. Social History:  reports that she has never smoked. She has never used smokeless tobacco. She reports that she does not drink alcohol or use drugs.  Allergies:  Allergies  Allergen Reactions  . Codeine Nausea And Vomiting    In large quantities     Medications Prior to Admission  Medication Sig Dispense Refill  . Cholecalciferol (VITAMIN D) 2000 units CAPS Take 2,000 Units by mouth daily after breakfast.    . cycloSPORINE (RESTASIS) 0.05 % ophthalmic emulsion Place 1 drop into both eyes 2 (two) times daily.    Marland Kitchen HYDROcodone-acetaminophen (NORCO) 10-325 MG tablet Take 1 tablet by mouth every 6 (six) hours as needed (for pain.).    Marland Kitchen levothyroxine (SYNTHROID, LEVOTHROID) 112 MCG tablet Take 112 mcg by mouth daily before breakfast.    . Magnesium 250 MG TABS Take 250  mg by mouth daily after breakfast.    . Multiple Vitamin (MULTIVITAMIN WITH MINERALS) TABS tablet Take 1 tablet by mouth daily.    . quinapril (ACCUPRIL) 10 MG tablet Take 10 mg by mouth daily.    . RABEprazole (ACIPHEX) 20 MG tablet Take 20 mg by mouth 2 (two) times daily as needed (scheduled every morning & at night if needed).    . simvastatin (ZOCOR) 40 MG tablet Take 40 mg by mouth at bedtime.    . triamterene-hydrochlorothiazide (MAXZIDE-25) 37.5-25 MG tablet Take 1 tablet by mouth daily.    . butalbital-acetaminophen-caffeine (FIORICET, ESGIC) 50-325-40 MG tablet Take 1 tablet by mouth 2 (two) times daily as needed for migraine.      No results found for this or any previous visit (from the past 48 hour(s)). No results found.  Review of Systems  Musculoskeletal: Positive for back pain, joint pain and myalgias.  Neurological: Positive for tingling and sensory change.    Blood pressure (!) 167/90, pulse 96, temperature 97.9 F (36.6 C), temperature source Oral, resp. rate 17, height 5\' 3"  (1.6 m), weight 77.9 kg (171 lb 11.2 oz), SpO2 100 %. Physical Exam  Constitutional: She is oriented to person, place, and time. She appears well-developed.  HENT:  Head: Normocephalic.  Eyes: Pupils are equal, round, and reactive to light.  Neck: Normal range of motion.  Respiratory: Effort normal.  GI: Soft.  Neurological: She is alert and oriented to person, place, and time. She has normal strength. GCS  eye subscore is 4. GCS verbal subscore is 5. GCS motor subscore is 6.  Iliopsoas, quads hamstrings EHL, AT,PF  Skin: Skin is warm and dry.     Assessment/Plan 71 year old female presents for an L4 To S1 fusion  Rutger Salton P, MD 04/25/2016, 1:08 PM

## 2016-04-25 NOTE — Op Note (Signed)
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   postoperative diagnosis: Same  Procedure: #1 decompressive lumbar laminectomy L4-5 and excess and requiring more work to would be needed with a standard interbody fusion with complete facetectomies radical foraminotomies of the L4 and L5 nerve roots. Redo decompressive lumbar laminectomy L5-S1 in excess and requiring more work to would be needed with a standard interbody fusion.  #2 posterior lumbar interbody fusion L4-5 L5-S1 utilizing the globus expandable cage system packed with locally harvested autograft mixed with vivgen and BMP  #3 posterior lateral arthrodesis L4-S1 utilizing locally harvested autograft mixed with vivgen and BMP  #4 pedicle screw fixation L4-S1 using the globus Creole amp pedicle screw set  Surgeon: Jillyn Hidden Yerachmiel Spinney  Asst.: Tressie Stalker  Anesthesia: Gen.  BL: Mental  History of present illness: Patient is very pleasant 71 year female whose had long-standing issues with her back in the previous 2 discectomies at L5-S1 on the right and did very well for many years however over the last several weeks and months of progress worsening back and bilateral leg pain and neurogenic claudication and significant progressive disability. Workup has revealed severe spinal stenosis at L4-5 with virtual complete block of CSF flow and marked joint disease L5-S1 and recurrent disc herniation. Due to patient's failure conservative treatment imaging findings and progressive clinical syndrome I recommended decompression stabilization procedure at L4-5 and L5-S1. I extensively went over the risks and benefits of the operation with the patient as well as perioperative course expectations of outcome and alternatives of surgery and she understood and agreed to proceed forward.  Operative procedure: Patient brought into the or was induced under general anesthesia  positioned prone the Wilson frame her back was prepped and draped in routine sterile fashion her old incision was opened up and extended slightly cephalad caudally Bovie light car was used to 10 subcutaneous tissues and subperiosteal dissections care lamina of L4, L5, and S1 bilaterally. TPs at L4-5 and 1 were also dissected free. Interoperative x-ray confirmed good condition. Level so sent decompression was initiated with removal of the spinous process at L4 scar tissues dissected off of the residual laminotomy defect on the right at L5 and the spinous process was removed facet joints were drilled down a high-speed drill laminotomy was begun with a 3 and 4 mm Kerrison punch. L4-5 in areas where never been operated before performed complete medial facetectomies radical foraminotomies of the L4 and L5 nerve roots. There was marked hourglass compression of thecal sac at this level due to marked facet arthropathy diastases of the facet joints marked overgrowth of the ligament and ligamentous hypertrophy. All this was freed up to gain access to the lateral margins disc space aggressively under biting the superior tickling facet at L5. Then I marched inferiorly first on the left side where she had not had surgery performed complete medial facetectomies radical foraminotomy under biting the superior tickling facet of S1 gain access to the lateral aspect the disc space. Then working the scar tissue free the scar tissue removed the residual of the lamina and facet complex on the right at L5-S1. Expose the disc space and then working at L4-5 initially with sequential distraction with 12 distractor inserted a cleaned of disc space on the left with panel shavers not curettes and then selected and 1117 expandable peek cage packed with the autograft mix and expanded opening up the disc space decompressing the L4 foramen even more. Then cleaning out  the right side and a similar fashion also discussed was removed endplates were  prepared local autograft BMP in the mix was packed centrally and the contralateral cage was inserted. S1 this disc was noted markedly collapsed C utilizing sequential distraction starting and 50 Worked My Way up to 9 and selected an 8-15 expandable 10 lordotic cage. The operation been achieved and cleaned out inserted at the lateral cage packed the autograft mix and BMP centrally and inserted the lateral lateral cage. After all the cages been confirmed to be in good position. I then drilled pilot holes and utilizing all cannulated pedicles at L4-5 and S1 in routine sterile fashion. Probed the pedicles tapped with a 55 6 5 x 45 screws inserted L4 6 540s at L5 and S1. All screws purchased. The wound scope was irrigated meticulous in space was maintained I aggressively decorticated the TPs lateral gutters or lace the local autograft mixed with BMP posterior laterally I also added somekinex posterior laterally. Then sized up a couple rods anchored everything in place and placed a cross-link injected X Burrell in the fascia speckled vancomycin the wound inspected the foramina to confirm patency no migration of graft material closed in layers with after Vicryl and a running 4 subcuticular. Dermabond benzo and Steri-Strips and sterile dressing was applied patient recovered in stable condition. At the end the case all needle counts sponge counts were correct.

## 2016-04-25 NOTE — Anesthesia Procedure Notes (Signed)
Procedure Name: Intubation Date/Time: 04/25/2016 1:26 PM Performed by: Melina Copa, Dayn Barich R Pre-anesthesia Checklist: Patient identified, Emergency Drugs available, Suction available and Patient being monitored Patient Re-evaluated:Patient Re-evaluated prior to inductionOxygen Delivery Method: Circle System Utilized Preoxygenation: Pre-oxygenation with 100% oxygen Intubation Type: IV induction Ventilation: Mask ventilation without difficulty Laryngoscope Size: Mac and 3 Grade View: Grade I Tube type: Oral Tube size: 7.0 mm Number of attempts: 1 Airway Equipment and Method: Stylet Placement Confirmation: ETT inserted through vocal cords under direct vision,  positive ETCO2 and breath sounds checked- equal and bilateral Secured at: 20 cm Tube secured with: Tape Dental Injury: Teeth and Oropharynx as per pre-operative assessment

## 2016-04-25 NOTE — Progress Notes (Signed)
Paged Dr Wynetta Emery to second sign orders.

## 2016-04-25 NOTE — Progress Notes (Signed)
Dr Wynetta Emery paged to second sign orders

## 2016-04-26 MED ORDER — METHOCARBAMOL 1000 MG/10ML IJ SOLN
1000.0000 mg | Freq: Four times a day (QID) | INTRAVENOUS | Status: DC | PRN
  Administered 2016-04-26 – 2016-04-27 (×2): 1000 mg via INTRAVENOUS
  Filled 2016-04-26 (×5): qty 10

## 2016-04-26 MED ORDER — OXYCODONE HCL 5 MG PO TABS
15.0000 mg | ORAL_TABLET | ORAL | Status: DC | PRN
  Administered 2016-04-26 – 2016-04-27 (×4): 15 mg via ORAL
  Administered 2016-04-27: 10 mg via ORAL
  Filled 2016-04-26 (×5): qty 3

## 2016-04-26 MED ORDER — PANTOPRAZOLE SODIUM 40 MG PO TBEC
40.0000 mg | DELAYED_RELEASE_TABLET | Freq: Every day | ORAL | Status: DC
  Administered 2016-04-26: 40 mg via ORAL
  Filled 2016-04-26: qty 1

## 2016-04-26 MED ORDER — DEXAMETHASONE SODIUM PHOSPHATE 10 MG/ML IJ SOLN
10.0000 mg | Freq: Four times a day (QID) | INTRAMUSCULAR | Status: AC
  Administered 2016-04-26 – 2016-04-27 (×4): 10 mg via INTRAVENOUS
  Filled 2016-04-26 (×4): qty 1

## 2016-04-26 MED ORDER — SENNA 8.6 MG PO TABS
2.0000 | ORAL_TABLET | Freq: Every day | ORAL | Status: DC
  Administered 2016-04-26: 17.2 mg via ORAL
  Filled 2016-04-26: qty 2

## 2016-04-26 MED ORDER — SENNOSIDES-DOCUSATE SODIUM 8.6-50 MG PO TABS: 2.0000 | ORAL_TABLET | Freq: Every day | ORAL | Status: DC

## 2016-04-26 MED FILL — Thrombin For Soln 20000 Unit: CUTANEOUS | Qty: 1 | Status: AC

## 2016-04-26 NOTE — Progress Notes (Signed)
Brief OT Note  Patient Details Name: Erin [name] MRN: [id number] DOB: 04/12/1945   OT evaluation completed. OT recommending SNF. Full OT note to follow.   Evern Bio Demeshia Sherburne 04/26/2016, 5:37 PM  Sherryl Manges OTR/L [phone]

## 2016-04-26 NOTE — Progress Notes (Signed)
Subjective: Patient reports Pain a lot of muscle spasm uncontrolled by medications overnight  Objective: Vital signs in last 24 hours: Temp:  [97.6 F (36.4 C)-99.2 F (37.3 C)] 98.5 F (36.9 C) (03/08 0535) Pulse Rate:  [78-104] 101 (03/08 0535) Resp:  [12-23] 18 (03/08 0535) BP: (105-170)/(57-111) 105/57 (03/08 0535) SpO2:  [94 %-100 %] 100 % (03/08 0535) Weight:  [77.9 kg (171 lb 11.2 oz)-84.1 kg (185 lb 6.4 oz)] 84.1 kg (185 lb 6.4 oz) (03/07 1954)  Intake/Output from previous day: 03/07 0701 - 03/08 0700 In: 2100 [I.V.:2100] Out: 1225 [Urine:525; Drains:450; Blood:250] Intake/Output this shift: No intake/output data recorded.  Strength 5 out of 5 wound clean dry and intact  Lab Results:  Recent Labs  04/23/16 0945  WBC 9.7  HGB 13.7  HCT 40.9  PLT 178   BMET  Recent Labs  04/23/16 0945  NA 133*  K 3.8  CL 94*  CO2 31  GLUCOSE 106*  BUN 16  CREATININE 0.77  CALCIUM 9.9    Studies/Results: Dg Lumbar Spine 2-3 Views  Result Date: 04/25/2016 CLINICAL DATA:  Posterior lumbar interbody fusion at L4-5 EXAM: LUMBAR SPINE - 2-3 VIEW COMPARISON:  None FLUOROSCOPY TIME:  37 seconds FINDINGS: Three intraoperative fluoroscopic spot images are provided. Posterior tissue retractors. Bilateral pedicle screws at L4, L5 and S1 with interbody disc spacers at L4-5 and L5-S1. IMPRESSION: Posterior lumbar interbody fusion at L4-S1. Electronically Signed   By: Elige Ko   On: 04/25/2016 17:08   Dg C-arm 1-60 Min  Result Date: 04/25/2016 CLINICAL DATA:  Postop lumbar spine fusion EXAM: DG C-ARM 61-120 MIN COMPARISON:  Lumbar spine films of 04/17/2016 FINDINGS: C-arm fluoroscopy was provided during posterior fusion from L4-S1. Fluoroscopy time of 37 seconds was recorded. IMPRESSION: C-arm fluoroscopy provided during posterior lumbar fusion from L4-S1. Electronically Signed   By: Dwyane Dee M.D.   On: 04/25/2016 17:07    Assessment/Plan:  mobilize today with physical  occupational therapy I had IV Robaxin and a couple doses of Decadron   LOS: 1 day     Erin [name] P 04/26/2016, 9:00 AM

## 2016-04-26 NOTE — Evaluation (Signed)
Physical Therapy Evaluation Patient Details Name: Erin [name] MRN: [id number] DOB: [birth year] Today's Date: 04/26/2016   History of Present Illness  Pt is a 71 y.o. female s/p a L4-5, L5-S1 PLIF on 04/25/16. PMH significant for HTN, hypothyroidism, and migraines. PSH significant for previous back surgery,   Clinical Impression  Pt presents with decreased strength in B LE which is inhibiting her from being able to independently get out of bed and ambulate. Previously used a cane for ambulation, now requires rolling walker and min assist for ambulation and standing balance. Pt required cueing for maintaining back precautions throughout treatment session. Continued skilled PT is recommended to address above impairments. Pt is requesting d/c to [facility] in [place]; PT recommends d/c to SNF to help patient reach previous level of function and increase safety and independence.     Follow Up Recommendations SNF (Pt request [facility] in [place]. )    Engineer, agricultural with 5" wheels    Recommendations for Other Services       Precautions / Restrictions Precautions Precautions: Back Precaution Booklet Issued: Yes (comment) Restrictions Weight Bearing Restrictions: No      Mobility  Bed Mobility Overal bed mobility: Needs Assistance Bed Mobility: Rolling;Sidelying to Sit;Sit to Sidelying Rolling: Supervision (cues for maintaining back precautions) Sidelying to sit: Min assist (for trunk support and cues to maintain back precautions)     Sit to sidelying: Min assist (pt required assistance to bring legs onto bed. ) General bed mobility comments: Pt required cues for maintaining back precautions   Transfers Overall transfer level: Needs assistance Equipment used: Rolling walker (2 wheeled) Transfers: Sit to/from Stand Sit to Stand: Min assist         General transfer comment: pt required cues for proper hand placement as well as maintaining  back precautions by not bending. Was able to initiate movement but required min assist for the last 50% of the movement.   Ambulation/Gait Ambulation/Gait assistance: Min guard Ambulation Distance (Feet): 80 Feet Assistive device: Rolling walker (2 wheeled) Gait Pattern/deviations: Step-through pattern;Decreased step length - right;Decreased step length - left;Decreased stride length     General Gait Details: overall decreased gait speed   Stairs            Wheelchair Mobility    Modified Rankin (Stroke Patients Only)       Balance Overall balance assessment: Needs assistance Sitting-balance support: Bilateral upper extremity supported;Feet supported Sitting balance-Leahy Scale: Fair Sitting balance - Comments: pt required UE support during LE strength testing in order to maintain balance   Standing balance support: Bilateral upper extremity supported;During functional activity Standing balance-Leahy Scale: Fair                               Pertinent Vitals/Pain Pain Assessment: 0-10 Pain Score: 6  Pain Location: operative site  Pain Descriptors / Indicators: Operative site guarding Pain Intervention(s): Limited activity within patient's tolerance;Monitored during session    Home Living Family/patient expects to be discharged to:: Skilled nursing facility Living Arrangements: Alone                    Prior Function Level of Independence: Independent with assistive device(s)         Comments: patient used a cane for ambulation previous to admission.      Hand Dominance        Extremity/Trunk Assessment   Upper Extremity  Assessment Upper Extremity Assessment: Defer to OT evaluation    Lower Extremity Assessment Lower Extremity Assessment: RLE deficits/detail;LLE deficits/detail RLE Deficits / Details: Decreased strength: knee extensors 4/5, knee flexors 4/5, DF 4+/5  LLE Deficits / Details: Decreased strength: knee extensors 4/5,  knee flexors 4/5, DF 4+/5     Cervical / Trunk Assessment Cervical / Trunk Assessment: Kyphotic  Communication   Communication: No difficulties  Cognition Arousal/Alertness: Awake/alert Behavior During Therapy: WFL for tasks assessed/performed Overall Cognitive Status: Within Functional Limits for tasks assessed                      General Comments      Exercises     Assessment/Plan    PT Assessment Patient needs continued PT services  PT Problem List Decreased strength;Decreased balance;Decreased mobility;Decreased activity tolerance;Decreased knowledge of use of DME;Decreased knowledge of precautions       PT Treatment Interventions DME instruction;Gait training;Functional mobility training;Therapeutic exercise;Balance training;Patient/family education    PT Goals (Current goals can be found in the Care Plan section)  Acute Rehab PT Goals Patient Stated Goal: to go to rehab and then go back home. PT Goal Formulation: With patient Time For Goal Achievement: 05/02/16 Potential to Achieve Goals: Good    Frequency Min 5X/week   Barriers to discharge Decreased caregiver support      Co-evaluation               End of Session Equipment Utilized During Treatment: Gait belt Activity Tolerance: Patient tolerated treatment well Patient left: in bed;with call bell/phone within reach;with bed alarm set   PT Visit Diagnosis: Other abnormalities of gait and mobility (R26.89)         Time: 9604-5409 PT Time Calculation (min) (ACUTE ONLY): 25 min   Charges:   PT Evaluation $PT Eval Moderate Complexity: 1 Procedure PT Treatments $Gait Training: 8-22 mins   PT G CodesMilana Kidney, SPT 04/26/2016

## 2016-04-26 NOTE — Care Management Note (Signed)
Case Management Note  Patient Details  Name: Erin [name] MRN: [id number] Date of Birth: 1945-03-02  Subjective/Objective:            Patient was admitted for a PLIF. Lives at home alone. CM will follow for discharge needs pending PT/OT evals and physician orders.         Action/Plan:   Expected Discharge Date:                  Expected Discharge Plan:     In-House Referral:     Discharge planning Services     Post Acute Care Choice:    Choice offered to:     DME Arranged:    DME Agency:     HH Arranged:    HH Agency:     Status of Service:     If discussed at [employer] of Stay Meetings, dates discussed:    Additional Comments:  Anda Kraft, RN 04/26/2016, 9:08 AM

## 2016-04-26 NOTE — Progress Notes (Signed)
Patient arrived to unit 5 c bed 18 from [facility]. Assisted to bed by nursing staff.Patient alert and oriented  X 4 .Oriented to nursing unit ,call bell ,and fall risk policy.Patient complaining of pain 10/10 in back had pain medication last in [facility] at 1928. Explained to patient and family that this nurse would get patient pain medication as soon as orders were reviewed by pharmacy. Patient daughter at bedside upset due to having to wait for pharmacy to review medications.Explained to patient and family for safety reasons pharmacist must check to make sure medications ordered can be given safely.

## 2016-04-27 MED ORDER — OXYCODONE HCL 15 MG PO TABS: 15.0000 mg | ORAL_TABLET | ORAL | 0 refills | Status: DC | PRN

## 2016-04-27 MED FILL — Sodium Chloride IV Soln 0.9%: INTRAVENOUS | Qty: 1000 | Status: AC

## 2016-04-27 MED FILL — Heparin Sodium (Porcine) Inj 1000 Unit/ML: INTRAMUSCULAR | Qty: 30 | Status: AC

## 2016-04-27 NOTE — Clinical Social Work Placement (Signed)
   CLINICAL SOCIAL WORK PLACEMENT  NOTE  Date:  04/27/2016  Patient Details  Name: Erin [name] MRN: [id number] Date of Birth: 10-19-1945  Clinical Social Work is seeking post-discharge placement for this patient at the Skilled  Nursing Facility level of care (*CSW will initial, date and re-position this form in  chart as items are completed):  Yes   Patient/family provided with [facility] Clinical Social Work Department's list of facilities offering this level of care within the geographic area requested by the patient (or if unable, by the patient's family).  Yes   Patient/family informed of their freedom to choose among providers that offer the needed level of care, that participate in Medicare, Medicaid or managed care program needed by the patient, have an available bed and are willing to accept the patient.  Yes   Patient/family informed of [facility]'s ownership interest in [facility] and [facility], as well as of the fact that they are under no obligation to receive care at these facilities.  PASRR submitted to EDS on 04/27/16     PASRR number received on 04/27/16     Existing PASRR number confirmed on       FL2 transmitted to all facilities in geographic area requested by pt/family on 04/27/16     FL2 transmitted to all facilities within larger geographic area on       Patient informed that his/her managed care company has contracts with or will negotiate with certain facilities, including the following:        Yes   Patient/family informed of bed offers received.  Patient chooses bed at [facility] and [facility]     Physician recommends and patient chooses bed at      Patient to be transferred to   on  .  Patient to be transferred to facility by       Patient family notified on   of transfer.  Name of family member notified:        PHYSICIAN       Additional Comment:    _______________________________________________ Dominic Pea,  LCSW 04/27/2016, 3:07 PM

## 2016-04-27 NOTE — Evaluation (Signed)
Occupational Therapy Evaluation - Late Entry Patient Details Name: Erin [name] MRN: [id number] DOB: [birth year] Today's Date: 04/27/2016    History of Present Illness Pt is a 71 y.o. female s/p a L4-5, L5-S1 PLIF on 04/25/16. PMH significant for HTN, hypothyroidism, and migraines. PSH significant for previous back surgery,    Clinical Impression   PTA Pt independent in ADL and mobility with cane. Pt currently mod A for LB ADL and min guard for mobility with RW. Pt educated on back precautions and verbally began education for AE. Pt will require SNF level therapy upon dc to return to PLOF and maximize safety and independence in ADL while maintaining back precautions. OT needs can be met at next venue.     Follow Up Recommendations  SNF;Supervision/Assistance - 24 hour    Equipment Recommendations  Other (comment) (defer to next venue)    Recommendations for Other Services       Precautions / Restrictions Precautions Precautions: Back Precaution Booklet Issued: Yes (comment) Precaution Comments: Pt educated in BLT precautions using acronym Restrictions Weight Bearing Restrictions: No      Mobility Bed Mobility Overal bed mobility: Needs Assistance Bed Mobility: Rolling;Sit to Sidelying Rolling: Supervision       Sit to sidelying: Min assist (assist to bring BLE into bed) General bed mobility comments: Pt required cues for maintaining back precautions   Transfers Overall transfer level: Needs assistance Equipment used: Rolling walker (2 wheeled) Transfers: Sit to/from Stand Sit to Stand: Min guard         General transfer comment: vc for safe hand placement    Balance Overall balance assessment: Needs assistance Sitting-balance support: Bilateral upper extremity supported;Feet supported Sitting balance-Leahy Scale: Fair Sitting balance - Comments: sitting EOB   Standing balance support: No upper extremity supported;During functional activity Standing  balance-Leahy Scale: Fair Standing balance comment: sink level grooming                            ADL Overall ADL's : Needs assistance/impaired Eating/Feeding: Modified independent;Sitting   Grooming: Wash/dry face;Oral care;Wash/dry hands;Min guard;Cueing for compensatory techniques Grooming Details (indicate cue type and reason): educated on cup method and setting up on right side to prevent twisting Upper Body Bathing: Minimal assistance   Lower Body Bathing: Moderate assistance   Upper Body Dressing : Moderate assistance   Lower Body Dressing: Moderate assistance   Toilet Transfer: Min guard;BSC;Ambulation (BSC over toilet) Toilet Transfer Details (indicate cue type and reason): vc for hand placement, and educated on standing to flush instead of twisting   Toileting - Clothing Manipulation Details (indicate cue type and reason): hospital gown     Functional mobility during ADLs: Min guard;Rolling walker General ADL Comments: Pt breifly educated verbally on AE  to assist with LB ADL     Vision Patient Visual Report: No change from baseline Vision Assessment?: No apparent visual deficits     Perception     Praxis      Pertinent Vitals/Pain Pain Assessment: 0-10 Pain Score: 6  Pain Location: operative site  Pain Descriptors / Indicators: Operative site guarding Pain Intervention(s): Monitored during session;Repositioned     Hand Dominance Right   Extremity/Trunk Assessment Upper Extremity Assessment Upper Extremity Assessment: Overall WFL for tasks assessed   Lower Extremity Assessment Lower Extremity Assessment: Defer to PT evaluation   Cervical / Trunk Assessment Cervical / Trunk Assessment: Other exceptions Cervical / Trunk Exceptions: s/p surgery   Communication Communication  Communication: No difficulties   Cognition Arousal/Alertness: Awake/alert Behavior During Therapy: WFL for tasks assessed/performed Overall Cognitive Status: Within  Functional Limits for tasks assessed                     General Comments       Exercises       Shoulder Instructions      Home Living Family/patient expects to be discharged to:: Skilled nursing facility Living Arrangements: Alone                                      Prior Functioning/Environment Level of Independence: Independent with assistive device(s)        Comments: patient used a cane for ambulation previous to admission.         OT Problem List: Decreased range of motion;Decreased activity tolerance;Impaired balance (sitting and/or standing);Decreased safety awareness;Decreased knowledge of use of DME or AE;Decreased knowledge of precautions;Pain      OT Treatment/Interventions:      OT Goals(Current goals can be found in the care plan section) Acute Rehab OT Goals Patient Stated Goal: to go to rehab and then go back home. OT Goal Formulation: With patient Time For Goal Achievement: 05/04/16 Potential to Achieve Goals: Good  OT Frequency:     Barriers to D/C:            Co-evaluation              End of Session Equipment Utilized During Treatment: Gait belt;Rolling walker Nurse Communication: Mobility status;Precautions  Activity Tolerance: Patient tolerated treatment well Patient left: in bed;with call bell/phone within reach;Other (comment) (with PT coming in for session)  OT Visit Diagnosis: Unsteadiness on feet (R26.81);Pain Pain - Right/Left:  (central) Pain - part of body:  (back)                ADL either performed or assessed with clinical judgement  Time: 1107-1140 OT Time Calculation (min): 33 min Charges:    G-Codes:     Hulda Humphrey OTR/L [place] 04/27/2016, 7:34 AM

## 2016-04-27 NOTE — Care Management Note (Signed)
Case Management Note  Patient Details  Name: Erin [name] MRN: [id number] Date of Birth: Dec 09, 1945  Subjective/Objective:  Pt admitted on 04/25/16 s/p L4-5, L5-S1 PLIF.  PTA, pt independent and lives at home.                    Action/Plan: PT recommending HHPT; OT recommends SNF.  Pt anticipates dc to SNF, as her MD does not want her navigating stairs at home.  She has no bathroom or bedroom on ground floor.  CSW consulted to facilitate dc to SNF; hopeful for insurance authorization.  Pt states she does not have a backup plan, as son also has stairs, and sister unable to assist her.    Expected Discharge Date:  04/27/16               Expected Discharge Plan:  Skilled Nursing Facility  In-House Referral:  Clinical Social Work  Discharge planning Services  CM Consult  Post Acute Care Choice:    Choice offered to:     DME Arranged:    DME Agency:     HH Arranged:    HH Agency:     Status of Service:  Completed, signed off  If discussed at [employer], dates discussed:    Additional Comments: 04/27/16 1615 Notified by CSW that insurance has approved SNF stay for pt.  Plan dc to SNF later via PTAR transport, per CSW arrangements.    Glennon Mac, RN 04/27/2016, 4:16 PM

## 2016-04-27 NOTE — Progress Notes (Signed)
Physical Therapy Treatment Patient Details Name: Erin [name] MRN: [id number] DOB: 1946/01/18 Today's Date: 04/27/2016    History of Present Illness Pt is a 71 y.o. female s/p a L4-5, L5-S1 PLIF on 04/25/16. PMH significant for HTN, hypothyroidism, and migraines. PSH significant for previous back surgery,     PT Comments    Pt progressing well with mobility, she ambulated 250' with RW with supervision. She is independent with bed mobility and transfers. She demonstrates good understanding of HEP. She declined stair training, stated her surgeon doesn't want her to do stairs due to fall risk. From a mobility standpoint, she is at a supervision level for mobility.    Follow Up Recommendations  HHPT with supervision for mobility.  Pt is expecting DC to SNF. Pt request [facility] in [place].  Pt lives alone, but stated she can bring in help.  She stated surgeon doesn't want her going up steps to her bedroom at home bc he doesn't want her to fall.      Equipment Recommendations  Rolling walker with 5" wheels    Recommendations for Other Services       Precautions / Restrictions Precautions Precautions: Back Precaution Booklet Issued: Yes (comment) Precaution Comments: reviewed precautions Restrictions Weight Bearing Restrictions: No    Mobility  Bed Mobility Overal bed mobility: Modified Independent Bed Mobility: Rolling;Sidelying to Sit Rolling: Modified independent (Device/Increase time) Sidelying to sit: Modified independent (Device/Increase time)     Sit to sidelying:  (assist to bring BLE into bed) General bed mobility comments: used bedrail, good log roll technique  Transfers Overall transfer level: Modified independent Equipment used: Rolling walker (2 wheeled) Transfers: Sit to/from Stand Sit to Stand: Modified independent (Device/Increase time)         General transfer comment: good hand placement  Ambulation/Gait Ambulation/Gait assistance:  Supervision Ambulation Distance (Feet): 250 Feet Assistive device: Rolling walker (2 wheeled) Gait Pattern/deviations: Step-through pattern;Decreased stride length   Gait velocity interpretation: Below normal speed for age/gender General Gait Details: steady, no LOB   Stairs- pt declined stair training, stated surgeon doesn't want her to do stairs 2* risk of falls            Wheelchair Mobility    Modified Rankin (Stroke Patients Only)       Balance Overall balance assessment: Needs assistance Sitting-balance support: Bilateral upper extremity supported;Feet supported Sitting balance-Leahy Scale: Fair Sitting balance - Comments: sitting EOB   Standing balance support: No upper extremity supported;During functional activity Standing balance-Leahy Scale: Fair Standing balance comment: sink level grooming                    Cognition Arousal/Alertness: Awake/alert Behavior During Therapy: WFL for tasks assessed/performed Overall Cognitive Status: Within Functional Limits for tasks assessed                      Exercises      General Comments        Pertinent Vitals/Pain Pain Assessment: 0-10 Pain Score: 3  Pain Location: operative site  Pain Descriptors / Indicators: Sore Pain Intervention(s): Limited activity within patient's tolerance;Monitored during session;Premedicated before session    Home Living Family/patient expects to be discharged to:: Skilled nursing facility Living Arrangements: Alone                  Prior Function Level of Independence: Independent with assistive device(s)      Comments: patient used a cane for ambulation previous to admission.  PT Goals (current goals can now be found in the care plan section) Acute Rehab PT Goals Patient Stated Goal: to work in her garden PT Goal Formulation: With patient Time For Goal Achievement: 05/02/16 Potential to Achieve Goals: Good Progress towards PT goals: Progressing  toward goals;Goals met and updated - see care plan    Frequency    Min 5X/week      PT Plan Current plan remains appropriate    Co-evaluation             End of Session Equipment Utilized During Treatment: Gait belt Activity Tolerance: Patient tolerated treatment well Patient left: in bed;with call bell/phone within reach Nurse Communication: Mobility status PT Visit Diagnosis: Other abnormalities of gait and mobility (R26.89);Pain Pain - part of body:  (back)     Time: 3888-2800 PT Time Calculation (min) (ACUTE ONLY): 21 min  Charges:  $Gait Training: 8-22 mins                    G Codes:       Erin [name] 04/27/2016, 9:46 AM [phone]

## 2016-04-27 NOTE — Clinical Social Work Note (Signed)
Clinical Social Work Assessment  Patient Details  Name: Erin [name] MRN: [id number] Date of Birth: [birth year]  Date of referral:  04/27/16               Reason for consult:  Discharge Planning                Permission sought to share information with:  Family Supports, Customer service manager Permission granted to share information::  Yes, Verbal Permission Granted  Name::     Quinita Kostelecky  Agency::     Relationship::  Son  Contact Information:  [phone]  Housing/Transportation Living arrangements for the past 2 months:  [place] of Information:    Patient Interpreter Needed:  None Criminal Activity/Legal Involvement Pertinent to Current Situation/Hospitalization:  No - Comment as needed Significant Relationships:  Adult Children Lives with:  Self Do you feel safe going back to the place where you live?  No Need for family participation in patient care:  No (Coment)  Care giving concerns:  No caregiving concerns identified.    Social Worker assessment / plan:  CSW and RNCM met with pt to address consult for new SNF. CSW introduced herself and explained role of social work. P/T is recommending HHPT. However, pt stated MD recommends SNF due to pt stairs in the home. CSW explained discharging to SNF with a managed Medicare. CSW also discussed other options if insurance auth for [facility] is not approved. Pt agreeable to SNF for STR and has already spoke with and confirmed a bed with [facility] in [place].   CSW sent clinicals to [facility] and accepted offer in the hub. Spoke with Crystal at facility who requested insurance authorization for [facility]. CSW spoke with Alyse Low at [facility], who stated she would have a [employer] worker call back for the status. CSW will continue to follow.   Employment status:  Retired Forensic scientist:   [employer]) PT Recommendations:  [place] / Referral to community  resources:  [place]  Patient/Family's Response to care:  Pt thankful and appreciative of CSW support.  Patient/Family's Understanding of and Emotional Response to Diagnosis, Current Treatment, and Prognosis:  Pt is looking forward to STR at [facility] and feels she will benefit from rehab before returning home.   Emotional Assessment Appearance:  Appears stated age Attitude/Demeanor/Rapport:   (Appropriate) Affect (typically observed):  Pleasant Orientation:  Oriented to Situation, Oriented to  Time, Oriented to Place, Oriented to Self Alcohol / Substance use:  Other Psych involvement (Current and /or in the community):  No (Comment)  Discharge Needs  Concerns to be addressed:  Care Coordination Readmission within the last 30 days:  No Current discharge risk:  Dependent with Mobility Barriers to Discharge:  Continued Medical Work up   [employer], LCSW 04/27/2016, 2:54 PM

## 2016-04-27 NOTE — NC FL2 (Signed)
[place] MEDICAID FL2 LEVEL OF CARE SCREENING TOOL     IDENTIFICATION  Patient Name: DEANNA HRITZ Birthdate: [birth year] Sex: female Admission Date (Current Location): 04/25/2016  [facility] and [place] Number:  Producer, television/film/video and Address:  [facility] [place], [address] [place], [place] [address]      Provider Number: [id number]  Attending Physician Name and Address:  Donalee Citrin, MD  Relative Name and Phone Number:       Current Level of Care: Hospital Recommended Level of Care: Skilled Nursing Facility Prior Approval Number:    Date Approved/Denied:   PASRR Number:    Discharge Plan: SNF    Current Diagnoses: Patient Active Problem List   Diagnosis Date Noted  . Spinal stenosis at L4-L5 level 04/25/2016    Orientation RESPIRATION BLADDER Height & Weight     Self, Time, Situation, Place  Normal Continent Weight: 185 lb 6.4 oz (84.1 kg) Height:  5\' 3"  (160 cm)  BEHAVIORAL SYMPTOMS/MOOD NEUROLOGICAL BOWEL NUTRITION STATUS      Continent Diet (Regular; thin fluids)  AMBULATORY STATUS COMMUNICATION OF NEEDS Skin   Limited Assist Verbally Normal (Surgical incision)                       Personal Care Assistance Level of Assistance  Bathing, Feeding, Dressing Bathing Assistance: Limited assistance Feeding assistance: Limited assistance Dressing Assistance: Limited assistance     Functional Limitations Info  Sight, Hearing, Speech Sight Info: Adequate Hearing Info: Adequate Speech Info: Adequate    SPECIAL CARE FACTORS FREQUENCY  PT (By licensed PT), OT (By licensed OT)     PT Frequency: 5 OT Frequency: 5            Contractures Contractures Info: Not present    Additional Factors Info  Code Status, Allergies Code Status Info: Full Allergies Info: Codeine           Current Medications (04/27/2016):  This is the current hospital active medication list Current Facility-Administered Medications  Medication Dose  Route Frequency Provider Last Rate Last Dose  . 0.9 %  sodium chloride infusion  250 mL Intravenous Continuous Donalee Citrin, MD 10 mL/hr at 04/27/16 0644 250 mL at 04/27/16 0644  . acetaminophen (TYLENOL) tablet 650 mg  650 mg Oral Q4H PRN Donalee Citrin, MD       Or  . acetaminophen (TYLENOL) suppository 650 mg  650 mg Rectal Q4H PRN Donalee Citrin, MD      . alum & mag hydroxide-simeth (MAALOX/MYLANTA) 200-200-20 MG/5ML suspension 30 mL  30 mL Oral Q6H PRN Donalee Citrin, MD      . butalbital-acetaminophen-caffeine (FIORICET, ESGIC) [phone] MG per tablet 1 tablet  1 tablet Oral BID PRN Donalee Citrin, MD      . ceFAZolin (ANCEF) IVPB 2g/100 mL premix  2 g Intravenous Q8H Donalee Citrin, MD   2 g at 04/27/16 2330  . cholecalciferol (VITAMIN D) tablet 2,000 Units  2,000 Units Oral QPC breakfast Donalee Citrin, MD   2,000 Units at 04/27/16 0900  . cyclobenzaprine (FLEXERIL) tablet 10 mg  10 mg Oral TID PRN Donalee Citrin, MD   10 mg at 04/26/16 0519  . cycloSPORINE (RESTASIS) 0.05 % ophthalmic emulsion 1 drop  1 drop Both Eyes BID Donalee Citrin, MD   1 drop at 04/27/16 1000  . docusate sodium (COLACE) capsule 100 mg  100 mg Oral BID Donalee Citrin, MD   100 mg at 04/27/16 1000  .  HYDROmorphone (DILAUDID) injection 1 mg  1 mg Intravenous Q2H PRN Donalee Citrin, MD   1 mg at 04/26/16 1610  . lactated ringers infusion   Intravenous Continuous Kipp Brood, MD 10 mL/hr at 04/27/16 0001    . levothyroxine (SYNTHROID, LEVOTHROID) tablet 112 mcg  112 mcg Oral QAC breakfast Donalee Citrin, MD   112 mcg at 04/27/16 0800  . lisinopril (PRINIVIL,ZESTRIL) tablet 5 mg  5 mg Oral Daily Donalee Citrin, MD   5 mg at 04/27/16 1000  . magnesium oxide (MAG-OX) tablet 200 mg  200 mg Oral Daily Donalee Citrin, MD   200 mg at 04/27/16 1001  . menthol-cetylpyridinium (CEPACOL) lozenge 3 mg  1 lozenge Oral PRN Donalee Citrin, MD       Or  . phenol (CHLORASEPTIC) mouth spray 1 spray  1 spray Mouth/Throat PRN Donalee Citrin, MD      . methocarbamol (ROBAXIN) 1,000 mg in dextrose 5 % 50 mL IVPB   1,000 mg Intravenous Q6H PRN Donalee Citrin, MD   1,000 mg at 04/27/16 1047  . multivitamin with minerals tablet 1 tablet  1 tablet Oral Daily Donalee Citrin, MD   1 tablet at 04/27/16 1000  . ondansetron (ZOFRAN) tablet 4 mg  4 mg Oral Q6H PRN Donalee Citrin, MD       Or  . ondansetron [facility]) injection 4 mg  4 mg Intravenous Q6H PRN Donalee Citrin, MD   4 mg at 04/26/16 0716  . oxyCODONE (Oxy IR/ROXICODONE) immediate release tablet 15 mg  15 mg Oral Q3H PRN Donalee Citrin, MD   10 mg at 04/27/16 0552  . pantoprazole (PROTONIX) EC tablet 40 mg  40 mg Oral QHS Donalee Citrin, MD   40 mg at 04/26/16 2134  . senna (SENOKOT) tablet 17.2 mg  2 tablet Oral QHS Donalee Citrin, MD   17.2 mg at 04/26/16 2248  . simvastatin (ZOCOR) tablet 40 mg  40 mg Oral QHS Donalee Citrin, MD   40 mg at 04/26/16 2134  . sodium chloride flush (NS) 0.9 % injection 3 mL  3 mL Intravenous Q12H Donalee Citrin, MD   3 mL at 04/26/16 2200  . sodium chloride flush (NS) 0.9 % injection 3 mL  3 mL Intravenous PRN Donalee Citrin, MD      . triamterene-hydrochlorothiazide [facility]) 37.5-25 MG per tablet 1 tablet  1 tablet Oral Daily Donalee Citrin, MD   1 tablet at 04/27/16 1000     Discharge Medications: Please see discharge summary for a list of discharge medications.  Relevant Imaging Results:  Relevant Lab Results:   Additional Information SSN: [id number]  Dominic Pea, LCSW

## 2016-04-27 NOTE — Clinical Social Work Placement (Signed)
   CLINICAL SOCIAL WORK PLACEMENT  NOTE  Date:  04/27/2016  Patient Details  Name: Erin [name] MRN: [id number] Date of Birth: [birth year]  Clinical Social Work is seeking post-discharge placement for this patient at the Skilled  Nursing Facility level of care (*CSW will initial, date and re-position this form in  chart as items are completed):  Yes   Patient/family provided with [place] Clinical Social Work Department's list of facilities offering this level of care within the geographic area requested by the patient (or if unable, by the patient's family).  Yes   Patient/family informed of their freedom to choose among providers that offer the needed level of care, that participate in Medicare, Medicaid or managed care program needed by the patient, have an available bed and are willing to accept the patient.  Yes   Patient/family informed of [facility]'s ownership interest in [facility] and [facility], as well as of the fact that they are under no obligation to receive care at these facilities.  PASRR submitted to EDS on 04/27/16     PASRR number received on 04/27/16     Existing PASRR number confirmed on       FL2 transmitted to all facilities in geographic area requested by pt/family on 04/27/16     FL2 transmitted to all facilities within larger geographic area on       Patient informed that his/her managed care company has contracts with or will negotiate with certain facilities, including the following:        Yes   Patient/family informed of bed offers received.  Patient [facility] bed at [facility] and [facility]     Physician recommends and patient chooses bed at      Patient to be [facility] to [facility] on 04/27/16.  Patient to be transferred to facility by PTAR     Patient family notified on 04/27/16 of transfer.  Name of family member notified:  Bevelyn Buckles     PHYSICIAN Please sign FL2     Additional Comment:   Pt is ready for discharge today and will go to [facility]. Pt and son-[name] are aware and agreeable to discharge plan. PASRR is [id number]. Insurance auth# is [id number]. Facility aware of auth and provided room and report. CSW sent clinicals and confirmed insurance auth with Crystal (admissions) at [facility]. Room and report put in treatment team sticky note. PTAR called for transportation. Pt, RN, facility, and son aware of d/c today. CSW is signing off as no further needs identified.  _______________________________________________ Dominic Pea, LCSW 04/27/2016, 5:14 PM

## 2016-04-27 NOTE — Discharge Summary (Signed)
  Physician Discharge Summary  Patient ID: Erin [name] MRN: [id number] DOB/AGE: 73/29/1947 73 y.o.  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 At the posterior to 2 people patient is stable to be transferred to rehabilitation facility she is awake alert neurologically intact incision clean dry and intact.  Consults: Significant Diagnostic Studies: Treatments: L4-5 L5 1 plIF Discharge Exam: Blood pressure (!) 102/58, pulse 67, temperature 97.9 F (36.6 C), temperature source Oral, resp. rate 18, height 5\' 3"  (1.6 m), weight 84.1 kg (185 lb 6.4 oz), SpO2 94 %. Strength out of 5 wound clean dry and intact  Disposition: Skilled nursing facility  Allergies as of 04/27/2016      Reactions   Codeine Nausea And Vomiting   In large quantities       Medication List    TAKE these medications   butalbital-acetaminophen-caffeine 50-325-40 MG tablet Commonly known as:  FIORICET, ESGIC Take 1 tablet by mouth 2 (two) times daily as needed for migraine.   cycloSPORINE 0.05 % ophthalmic emulsion Commonly known as:  RESTASIS Place 1 drop into both eyes 2 (two) times daily.   HYDROcodone-acetaminophen 10-325 MG tablet Commonly known as:  NORCO Take 1 tablet by mouth every 6 (six) hours as needed (for pain.).   levothyroxine 112 MCG tablet Commonly known as:  SYNTHROID, LEVOTHROID Take 112 mcg by mouth daily before breakfast.   Magnesium 250 MG Tabs Take 250 mg by mouth daily after breakfast.    multivitamin with minerals Tabs tablet Take 1 tablet by mouth daily.   oxyCODONE 15 MG immediate release tablet Commonly known as:  ROXICODONE Take 1 tablet (15 mg total) by mouth every 3 (three) hours as needed for moderate pain.   quinapril 10 MG tablet Commonly known as:  ACCUPRIL Take 10 mg by mouth daily.   RABEprazole 20 MG tablet Commonly known as:  ACIPHEX Take 20 mg by mouth 2 (two) times daily as needed (scheduled every morning & at night if needed).   simvastatin 40 MG tablet Commonly known as:  ZOCOR Take 40 mg by mouth at bedtime.   triamterene-hydrochlorothiazide 37.5-25 MG tablet Commonly known as:  MAXZIDE-25 Take 1 tablet by mouth daily.   Vitamin D 2000 units Caps Take 2,000 Units by mouth daily after breakfast.      Follow-up Information    Kwasi Joung P, MD Follow up in 2 day(s).   Specialty:  Neurosurgery Contact information: [address] [place] [address] [phone]           Signed: Mariam Dollar 04/27/2016, 6:18 AM

## 2016-04-27 NOTE — Progress Notes (Signed)
Patient ID: Erin [name], female   DOB: Nov 10, 1945, 71 y.o.   MRN: [id number] Patient feels much better this morning significant improvement back and leg pain very well managed at this point.  Strength 5 out of 5 wound clean dry and intact  Will immobilize throughout the day work with physical therapy but I do think she is stable to transfer to rehabilitation assessment should a bed Available.

## 2016-04-27 NOTE — Progress Notes (Signed)
Patient discharged to rehabilitation facility.  IV access discontinued. Discharge information given and report called to facility. Patient to transfer from unit via stretcher with PTAR. Patient questions asked and answered.  Lawson Radar

## 2016-05-07 NOTE — Anesthesia Postprocedure Evaluation (Addendum)
Anesthesia Post Note  Patient: Erin [name]  Procedure(s) Performed: Procedure(s) (LRB): POSTERIOR LUMBAR INTERBODY FUSION - LUMBAR FOUR-LUMBAR FOUR - LUMBAR FIVE-SACRAL ONE (N/A)  Patient location during evaluation: PACU Anesthesia Type: General Level of consciousness: awake, awake and alert and oriented Pain management: pain level controlled Vital Signs Assessment: post-procedure vital signs reviewed and stable Respiratory status: spontaneous breathing, nonlabored ventilation and respiratory function stable Cardiovascular status: blood pressure returned to baseline Postop Assessment: no headache Anesthetic complications: no       Last Vitals:  Vitals:   04/27/16 1437 04/27/16 1748  BP: 130/68 (!) 142/67  Pulse: 88 (!) 112  Resp: 18 18  Temp: 36.8 C     Last Pain:  Vitals:   04/27/16 1748  TempSrc: Oral  PainSc:                  Sadi Arave COKER

## 2016-05-12 ENCOUNTER — Emergency Department (HOSPITAL_BASED_OUTPATIENT_CLINIC_OR_DEPARTMENT_OTHER)
Admission: EM | Admit: 2016-05-12 | Discharge: 2016-05-12 | Disposition: A | Discharge status: Home / Self Care | Payer: Medicare HMO | Attending: Emergency Medicine | Admitting: Emergency Medicine

## 2016-05-12 ENCOUNTER — Encounter (HOSPITAL_BASED_OUTPATIENT_CLINIC_OR_DEPARTMENT_OTHER): Payer: Self-pay | Admitting: Emergency Medicine

## 2016-05-12 DIAGNOSIS — T7840XA Allergy, unspecified, initial encounter: Secondary | ICD-10-CM | POA: Insufficient documentation

## 2016-05-12 DIAGNOSIS — Z79899 Other long term (current) drug therapy: Secondary | ICD-10-CM | POA: Diagnosis not present

## 2016-05-12 DIAGNOSIS — E039 Hypothyroidism, unspecified: Secondary | ICD-10-CM | POA: Insufficient documentation

## 2016-05-12 DIAGNOSIS — I10 Essential (primary) hypertension: Secondary | ICD-10-CM | POA: Insufficient documentation

## 2016-05-12 MED ORDER — FAMOTIDINE 20 MG PO TABS
40.0000 mg | ORAL_TABLET | Freq: Once | ORAL | Status: AC
  Administered 2016-05-12: 40 mg via ORAL
  Filled 2016-05-12: qty 2

## 2016-05-12 NOTE — ED Notes (Signed)
Patient is resting comfortably. 

## 2016-05-12 NOTE — ED Provider Notes (Addendum)
[facility] Note   CSN: [id number] Arrival date & time: 05/12/16  1227     History   Chief Complaint Chief Complaint  Patient presents with  . Allergic Reaction    HPI Erin [name] is a 71 y.o. female.  71 yo F with a chief complaint of an allergic reaction. Family is unsure what the cause of this was. She has easily started tramadol last night. Otherwise denies any new medications. Recently had surgery about a month ago. Patient woke up this morning with severe facial swelling diffuse hives. Denied shortness of breath denied sensation that her throat was closing up on her denied vomiting or diarrhea denied feeling should pass out. She went to her family physician's office who gave her a 8 mg shot of Decadron and a 50 mg shot of Benadryl. Wrote her a prescription for a steroid taper as well as a prescription for Zantac.  She had some resolution and was sent here for further observation.   The history is provided by the patient.  Allergic Reaction  Presenting symptoms: rash   Presenting symptoms: no wheezing   Severity:  Severe Duration:  2 days Prior allergic episodes:  No prior episodes Relieved by:  Antihistamines and steroids Worsened by:  Nothing Ineffective treatments:  None tried Illness  This is a new problem. The current episode started 3 to 5 hours ago. The problem occurs constantly. The problem has been rapidly improving. Associated symptoms include shortness of breath. Pertinent negatives include no chest pain and no headaches. Nothing aggravates the symptoms. Nothing relieves the symptoms. She has tried nothing for the symptoms. The treatment provided no relief.    Past Medical History:  Diagnosis Date  . GERD (gastroesophageal reflux disease)    occ  . Headache    migraines hx  . Hypertension   . Hypothyroidism     Patient Active Problem List   Diagnosis Date Noted  . Spinal stenosis at L4-L5 level 04/25/2016    Past Surgical  History:  Procedure Laterality Date  . BACK SURGERY  07/2005   x2  . CARPAL TUNNEL RELEASE Bilateral 80's  . CERVICAL DISCECTOMY  2009  . TONSILLECTOMY      OB History    No data available       Home Medications    Prior to Admission medications   Medication Sig Start Date End Date Taking? Authorizing Provider  butalbital-acetaminophen-caffeine (FIORICET, ESGIC) 50-325-40 MG tablet Take 1 tablet by mouth 2 (two) times daily as needed for migraine.   Yes Historical Provider, MD  Cholecalciferol (VITAMIN D) 2000 units CAPS Take 2,000 Units by mouth daily after breakfast.   Yes Historical Provider, MD  cycloSPORINE (RESTASIS) 0.05 % ophthalmic emulsion Place 1 drop into both eyes 2 (two) times daily.   Yes Historical Provider, MD  HYDROcodone-acetaminophen (NORCO) 10-325 MG tablet Take 1 tablet by mouth every 6 (six) hours as needed (for pain.).   Yes Historical Provider, MD  levothyroxine (SYNTHROID, LEVOTHROID) 112 MCG tablet Take 112 mcg by mouth daily before breakfast.   Yes Historical Provider, MD  Magnesium 250 MG TABS Take 250 mg by mouth daily after breakfast.   Yes Historical Provider, MD  Multiple Vitamin (MULTIVITAMIN WITH MINERALS) TABS tablet Take 1 tablet by mouth daily.   Yes Historical Provider, MD  quinapril (ACCUPRIL) 10 MG tablet Take 10 mg by mouth daily.   Yes Historical Provider, MD  RABEprazole (ACIPHEX) 20 MG tablet Take 20 mg by mouth 2 (  two) times daily as needed (scheduled every morning & at night if needed).   Yes Historical Provider, MD  simvastatin (ZOCOR) 40 MG tablet Take 40 mg by mouth at bedtime.   Yes Historical Provider, MD  triamterene-hydrochlorothiazide (MAXZIDE-25) 37.5-25 MG tablet Take 1 tablet by mouth daily.   Yes Historical Provider, MD  oxyCODONE (ROXICODONE) 15 MG immediate release tablet Take 1 tablet (15 mg total) by mouth every 3 (three) hours as needed for moderate pain. 04/27/16   Donalee Citrin, MD    Family History No family history on  file.  Social History Social History  Substance Use Topics  . Smoking status: Never Smoker  . Smokeless tobacco: Never Used  . Alcohol use No     Allergies   Codeine   Review of Systems Review of Systems  Constitutional: Negative for chills and fever.  HENT: Positive for facial swelling. Negative for congestion and rhinorrhea.   Eyes: Negative for redness and visual disturbance.  Respiratory: Positive for shortness of breath. Negative for wheezing.   Cardiovascular: Negative for chest pain and palpitations.  Gastrointestinal: Negative for nausea and vomiting.  Genitourinary: Negative for dysuria and urgency.  Musculoskeletal: Negative for arthralgias and myalgias.  Skin: Positive for rash. Negative for pallor and wound.  Neurological: Negative for dizziness and headaches.     Physical Exam Updated Vital Signs BP (!) 149/88 (BP Location: Left Arm)   Pulse 94   Temp 98.7 F (37.1 C) (Oral)   Resp (!) 24   Ht 5\' 3"  (1.6 m)   Wt 170 lb (77.1 kg)   SpO2 96%   BMI 30.11 kg/m   Physical Exam  Constitutional: She is oriented to person, place, and time. She appears well-developed and well-nourished. No distress.  HENT:  Head: Normocephalic and atraumatic.  Marked facial swelling. Trace hives to the left anterior neck.  Eyes: EOM are normal. Pupils are equal, round, and reactive to light.  Neck: Normal range of motion. Neck supple.  Cardiovascular: Normal rate and regular rhythm.  Exam reveals no gallop and no friction rub.   No murmur heard. Pulmonary/Chest: Effort normal. She has no wheezes. She has no rales.  Abdominal: Soft. She exhibits no distension and no mass. There is no tenderness. There is no guarding.  Musculoskeletal: She exhibits no edema or tenderness.  Neurological: She is alert and oriented to person, place, and time.  Skin: Skin is warm and dry. She is not diaphoretic.  Psychiatric: She has a normal mood and affect. Her behavior is normal.  Nursing  note and vitals reviewed.    ED Treatments / Results  Labs (all labs ordered are listed, but only abnormal results are displayed) Labs Reviewed - No data to display  EKG  EKG Interpretation None       Radiology No results found.  Procedures Procedures (including critical care time)  Medications Ordered in ED Medications  famotidine (PEPCID) tablet 40 mg (40 mg Oral Given 05/12/16 1327)     Initial Impression / Assessment and Plan / ED Course  I have reviewed the triage vital signs and the nursing notes.  Pertinent labs & imaging results that were available during my care of the patient were reviewed by me and considered in my medical decision making (see chart for details).     71 yo F With a chief complaint of an allergic reaction. Per the family this has severely improved over the past couple hours. I will continue the observed the patient in the  ED. Give Pepcid.  Continuing to improve while in the ED.  Will d/c home.    3:00 PM:  I have discussed the diagnosis/risks/treatment options with the patient and family and believe the pt to be eligible for discharge home to follow-up with PCP. We also discussed returning to the ED immediately if new or worsening sx occur. We discussed the sx which are most concerning (e.g., sudden worsening sob, weakness, throat swelling) that necessitate immediate return. Medications administered to the patient during their visit and any new prescriptions provided to the patient are listed below.  Medications given during this visit Medications  famotidine (PEPCID) tablet 40 mg (40 mg Oral Given 05/12/16 1327)     The patient appears reasonably screen and/or stabilized for discharge and I doubt any other medical condition or other [facility] requiring further screening, evaluation, or treatment in the ED at this time prior to discharge.    Final Clinical Impressions(s) / ED Diagnoses   Final diagnoses:  Allergic reaction, initial encounter     New Prescriptions New Prescriptions   No medications on file     Melene Plan, DO 05/12/16 1500    Melene Plan, DO 05/12/16 1501

## 2016-05-12 NOTE — ED Notes (Signed)
ED Provider at bedside. 

## 2016-05-12 NOTE — ED Notes (Signed)
Family at bedside. 

## 2016-05-12 NOTE — ED Triage Notes (Signed)
Pt present s with allergive reaction to unknown agent upon waking this morning.  Pt has severe facial edema and hives.  Pt seen at [facility] medical this morning and tx with IM sterioids and benadryl.  Children of pt state pt's facial swelling "is about half of what it was this morning" and that this morning, the pt was so swollen she could hardly talk.  Pt denies SOB at present.

## 2016-05-12 NOTE — Discharge Instructions (Signed)
Take benadryl and zantac regularly for the next couple of days.  Return for sudden worsening.  If you have trouble breathing use you epi pen.  Come to the ED if you have to use your pen.

## 2016-05-24 ENCOUNTER — Encounter (HOSPITAL_BASED_OUTPATIENT_CLINIC_OR_DEPARTMENT_OTHER): Payer: Self-pay | Admitting: Emergency Medicine

## 2016-05-24 ENCOUNTER — Emergency Department (HOSPITAL_BASED_OUTPATIENT_CLINIC_OR_DEPARTMENT_OTHER): Payer: Medicare HMO

## 2016-05-24 ENCOUNTER — Inpatient Hospital Stay (HOSPITAL_BASED_OUTPATIENT_CLINIC_OR_DEPARTMENT_OTHER)
Admission: EM | Admit: 2016-05-24 | Discharge: 2016-06-07 | DRG: 233 | Disposition: A | Discharge status: Skilled Nursing Facility | Payer: Medicare HMO | Attending: Thoracic Surgery (Cardiothoracic Vascular Surgery) | Admitting: Thoracic Surgery (Cardiothoracic Vascular Surgery)

## 2016-05-24 DIAGNOSIS — Z8249 Family history of ischemic heart disease and other diseases of the circulatory system: Secondary | ICD-10-CM | POA: Diagnosis not present

## 2016-05-24 DIAGNOSIS — G8929 Other chronic pain: Secondary | ICD-10-CM | POA: Diagnosis not present

## 2016-05-24 DIAGNOSIS — I248 Other forms of acute ischemic heart disease: Secondary | ICD-10-CM | POA: Diagnosis not present

## 2016-05-24 DIAGNOSIS — D696 Thrombocytopenia, unspecified: Secondary | ICD-10-CM | POA: Diagnosis not present

## 2016-05-24 DIAGNOSIS — I480 Paroxysmal atrial fibrillation: Secondary | ICD-10-CM | POA: Diagnosis not present

## 2016-05-24 DIAGNOSIS — Y92410 Unspecified street and highway as the place of occurrence of the external cause: Secondary | ICD-10-CM | POA: Diagnosis not present

## 2016-05-24 DIAGNOSIS — I4891 Unspecified atrial fibrillation: Secondary | ICD-10-CM | POA: Diagnosis present

## 2016-05-24 DIAGNOSIS — I2584 Coronary atherosclerosis due to calcified coronary lesion: Secondary | ICD-10-CM | POA: Diagnosis present

## 2016-05-24 DIAGNOSIS — E871 Hypo-osmolality and hyponatremia: Secondary | ICD-10-CM | POA: Diagnosis not present

## 2016-05-24 DIAGNOSIS — Z981 Arthrodesis status: Secondary | ICD-10-CM

## 2016-05-24 DIAGNOSIS — IMO0001 Reserved for inherently not codable concepts without codable children: Secondary | ICD-10-CM | POA: Diagnosis present

## 2016-05-24 DIAGNOSIS — M545 Low back pain, unspecified: Secondary | ICD-10-CM

## 2016-05-24 DIAGNOSIS — J9811 Atelectasis: Secondary | ICD-10-CM | POA: Diagnosis not present

## 2016-05-24 DIAGNOSIS — R202 Paresthesia of skin: Secondary | ICD-10-CM | POA: Diagnosis present

## 2016-05-24 DIAGNOSIS — R7989 Other specified abnormal findings of blood chemistry: Secondary | ICD-10-CM | POA: Diagnosis not present

## 2016-05-24 DIAGNOSIS — J939 Pneumothorax, unspecified: Secondary | ICD-10-CM

## 2016-05-24 DIAGNOSIS — I2510 Atherosclerotic heart disease of native coronary artery without angina pectoris: Principal | ICD-10-CM | POA: Diagnosis present

## 2016-05-24 DIAGNOSIS — I10 Essential (primary) hypertension: Secondary | ICD-10-CM | POA: Diagnosis present

## 2016-05-24 DIAGNOSIS — Z888 Allergy status to other drugs, medicaments and biological substances status: Secondary | ICD-10-CM

## 2016-05-24 DIAGNOSIS — G4730 Sleep apnea, unspecified: Secondary | ICD-10-CM | POA: Diagnosis present

## 2016-05-24 DIAGNOSIS — M978XXA Periprosthetic fracture around other internal prosthetic joint, initial encounter: Secondary | ICD-10-CM | POA: Diagnosis not present

## 2016-05-24 DIAGNOSIS — I7100 Dissection of unspecified site of aorta: Secondary | ICD-10-CM | POA: Diagnosis not present

## 2016-05-24 DIAGNOSIS — R000 Tachycardia, unspecified: Secondary | ICD-10-CM | POA: Diagnosis present

## 2016-05-24 DIAGNOSIS — M48061 Spinal stenosis, lumbar region without neurogenic claudication: Secondary | ICD-10-CM

## 2016-05-24 DIAGNOSIS — Z8711 Personal history of peptic ulcer disease: Secondary | ICD-10-CM

## 2016-05-24 DIAGNOSIS — J42 Unspecified chronic bronchitis: Secondary | ICD-10-CM | POA: Diagnosis not present

## 2016-05-24 DIAGNOSIS — I340 Nonrheumatic mitral (valve) insufficiency: Secondary | ICD-10-CM | POA: Diagnosis present

## 2016-05-24 DIAGNOSIS — R001 Bradycardia, unspecified: Secondary | ICD-10-CM | POA: Diagnosis not present

## 2016-05-24 DIAGNOSIS — Y792 Prosthetic and other implants, materials and accessory orthopedic devices associated with adverse incidents: Secondary | ICD-10-CM | POA: Diagnosis present

## 2016-05-24 DIAGNOSIS — Z951 Presence of aortocoronary bypass graft: Secondary | ICD-10-CM

## 2016-05-24 DIAGNOSIS — E663 Overweight: Secondary | ICD-10-CM | POA: Diagnosis present

## 2016-05-24 DIAGNOSIS — I459 Conduction disorder, unspecified: Secondary | ICD-10-CM | POA: Diagnosis present

## 2016-05-24 DIAGNOSIS — K219 Gastro-esophageal reflux disease without esophagitis: Secondary | ICD-10-CM | POA: Diagnosis not present

## 2016-05-24 DIAGNOSIS — G43909 Migraine, unspecified, not intractable, without status migrainosus: Secondary | ICD-10-CM | POA: Diagnosis present

## 2016-05-24 DIAGNOSIS — E669 Obesity, unspecified: Secondary | ICD-10-CM | POA: Diagnosis present

## 2016-05-24 DIAGNOSIS — F419 Anxiety disorder, unspecified: Secondary | ICD-10-CM | POA: Diagnosis present

## 2016-05-24 DIAGNOSIS — D62 Acute posthemorrhagic anemia: Secondary | ICD-10-CM | POA: Diagnosis not present

## 2016-05-24 DIAGNOSIS — E785 Hyperlipidemia, unspecified: Secondary | ICD-10-CM | POA: Diagnosis not present

## 2016-05-24 DIAGNOSIS — E8770 Fluid overload, unspecified: Secondary | ICD-10-CM | POA: Diagnosis not present

## 2016-05-24 DIAGNOSIS — E039 Hypothyroidism, unspecified: Secondary | ICD-10-CM | POA: Diagnosis present

## 2016-05-24 DIAGNOSIS — Z6829 Body mass index (BMI) 29.0-29.9, adult: Secondary | ICD-10-CM

## 2016-05-24 DIAGNOSIS — T849XXA Unspecified complication of internal orthopedic prosthetic device, implant and graft, initial encounter: Secondary | ICD-10-CM | POA: Diagnosis not present

## 2016-05-24 DIAGNOSIS — I700 Atherosclerosis of aorta: Secondary | ICD-10-CM | POA: Diagnosis present

## 2016-05-24 DIAGNOSIS — K5900 Constipation, unspecified: Secondary | ICD-10-CM | POA: Diagnosis not present

## 2016-05-24 DIAGNOSIS — I482 Chronic atrial fibrillation: Secondary | ICD-10-CM | POA: Diagnosis not present

## 2016-05-24 DIAGNOSIS — Z885 Allergy status to narcotic agent status: Secondary | ICD-10-CM

## 2016-05-24 DIAGNOSIS — E876 Hypokalemia: Secondary | ICD-10-CM

## 2016-05-24 DIAGNOSIS — E7800 Pure hypercholesterolemia, unspecified: Secondary | ICD-10-CM | POA: Diagnosis present

## 2016-05-24 DIAGNOSIS — M479 Spondylosis, unspecified: Secondary | ICD-10-CM | POA: Diagnosis present

## 2016-05-24 DIAGNOSIS — M170 Bilateral primary osteoarthritis of knee: Secondary | ICD-10-CM | POA: Diagnosis present

## 2016-05-24 DIAGNOSIS — I25110 Atherosclerotic heart disease of native coronary artery with unstable angina pectoris: Secondary | ICD-10-CM | POA: Diagnosis not present

## 2016-05-24 DIAGNOSIS — I25119 Atherosclerotic heart disease of native coronary artery with unspecified angina pectoris: Secondary | ICD-10-CM | POA: Diagnosis not present

## 2016-05-24 DIAGNOSIS — Z79899 Other long term (current) drug therapy: Secondary | ICD-10-CM

## 2016-05-24 HISTORY — DX: Unspecified osteoarthritis, unspecified site: M19.90

## 2016-05-24 HISTORY — DX: Migraine, unspecified, not intractable, without status migrainosus: G43.909

## 2016-05-24 HISTORY — DX: Family history of other specified conditions: Z84.89

## 2016-05-24 HISTORY — DX: Personal history of other diseases of the digestive system: Z87.19

## 2016-05-24 HISTORY — DX: Pure hypercholesterolemia, unspecified: E78.00

## 2016-05-24 HISTORY — DX: Personal history of peptic ulcer disease: Z87.11

## 2016-05-24 HISTORY — DX: Pneumonia, unspecified organism: J18.9

## 2016-05-24 HISTORY — DX: Dyspnea, unspecified: R06.00

## 2016-05-24 HISTORY — DX: Unspecified chronic bronchitis: J42

## 2016-05-24 HISTORY — DX: Low back pain: M54.5

## 2016-05-24 HISTORY — DX: Low back pain, unspecified: M54.50

## 2016-05-24 HISTORY — DX: Other chronic pain: G89.29

## 2016-05-24 LAB — CBC WITH DIFFERENTIAL/PLATELET
Basophils Absolute: 0 10*3/uL (ref 0.0–0.1)
Basophils Relative: 0 %
Eosinophils Absolute: 0.1 10*3/uL (ref 0.0–0.7)
Eosinophils Relative: 1 %
HCT: 31 % — ABNORMAL LOW (ref 36.0–46.0)
Hemoglobin: 9.8 g/dL — ABNORMAL LOW (ref 12.0–15.0)
Lymphocytes Relative: 19 %
Lymphs Abs: 2.1 10*3/uL (ref 0.7–4.0)
MCH: 27.1 pg (ref 26.0–34.0)
MCHC: 31.6 g/dL (ref 30.0–36.0)
MCV: 85.9 fL (ref 78.0–100.0)
Monocytes Absolute: 0.8 10*3/uL (ref 0.1–1.0)
Monocytes Relative: 7 %
Neutro Abs: 7.9 10*3/uL — ABNORMAL HIGH (ref 1.7–7.7)
Neutrophils Relative %: 73 %
Platelets: 327 10*3/uL (ref 150–400)
RBC: 3.61 MIL/uL — ABNORMAL LOW (ref 3.87–5.11)
RDW: 15.8 % — ABNORMAL HIGH (ref 11.5–15.5)
WBC: 10.9 10*3/uL — ABNORMAL HIGH (ref 4.0–10.5)

## 2016-05-24 LAB — COMPREHENSIVE METABOLIC PANEL
ALT: 16 U/L (ref 14–54)
AST: 22 U/L (ref 15–41)
Albumin: 3.4 g/dL — ABNORMAL LOW (ref 3.5–5.0)
Alkaline Phosphatase: 129 U/L — ABNORMAL HIGH (ref 38–126)
Anion gap: 12 (ref 5–15)
BUN: 18 mg/dL (ref 6–20)
CO2: 24 mmol/L (ref 22–32)
Calcium: 8.8 mg/dL — ABNORMAL LOW (ref 8.9–10.3)
Chloride: 98 mmol/L — ABNORMAL LOW (ref 101–111)
Creatinine, Ser: 1 mg/dL (ref 0.44–1.00)
GFR calc Af Amer: >60 mL/min (ref >60)
GFR calc non Af Amer: 55 mL/min — ABNORMAL LOW (ref >60)
Glucose, Bld: 191 mg/dL — ABNORMAL HIGH (ref 65–99)
Potassium: 2.7 mmol/L — CL (ref 3.5–5.1)
Sodium: 134 mmol/L — ABNORMAL LOW (ref 135–145)
Total Bilirubin: 0.3 mg/dL (ref 0.3–1.2)
Total Protein: 6.3 g/dL — ABNORMAL LOW (ref 6.5–8.1)

## 2016-05-24 LAB — URINALYSIS, MICROSCOPIC (REFLEX): RBC / HPF: NONE SEEN RBC/hpf (ref 0–5)

## 2016-05-24 LAB — URINALYSIS, ROUTINE W REFLEX MICROSCOPIC
Bilirubin Urine: NEGATIVE
Glucose, UA: NEGATIVE mg/dL
Hgb urine dipstick: NEGATIVE
Ketones, ur: NEGATIVE mg/dL
Nitrite: NEGATIVE
Protein, ur: NEGATIVE mg/dL
Specific Gravity, Urine: 1.009 (ref 1.005–1.030)
pH: 7 (ref 5.0–8.0)

## 2016-05-24 LAB — TROPONIN I
Troponin I: 0.06 ng/mL (ref <0.03)
Troponin I: 0.07 ng/mL (ref <0.03)
Troponin I: 0.06 ng/mL (ref <0.03)

## 2016-05-24 LAB — MRSA PCR SCREENING: MRSA by PCR: NEGATIVE

## 2016-05-24 LAB — TSH: TSH: 2.748 u[IU]/mL (ref 0.350–4.500)

## 2016-05-24 LAB — MAGNESIUM: Magnesium: 1.9 mg/dL (ref 1.7–2.4)

## 2016-05-24 MED ORDER — LEVOTHYROXINE SODIUM 112 MCG PO TABS
112.0000 ug | ORAL_TABLET | Freq: Every day | ORAL | Status: DC
  Administered 2016-05-25 – 2016-06-07 (×13): 112 ug via ORAL
  Filled 2016-05-24 (×14): qty 1

## 2016-05-24 MED ORDER — MAGNESIUM OXIDE 400 (241.3 MG) MG PO TABS
200.0000 mg | ORAL_TABLET | Freq: Every day | ORAL | Status: DC
  Administered 2016-05-25 – 2016-06-01 (×7): 200 mg via ORAL
  Administered 2016-06-02: 11:00:00 via ORAL
  Administered 2016-06-03 – 2016-06-07 (×5): 200 mg via ORAL
  Filled 2016-05-24 (×14): qty 1

## 2016-05-24 MED ORDER — CYCLOSPORINE 0.05 % OP EMUL
1.0000 [drp] | Freq: Two times a day (BID) | OPHTHALMIC | Status: DC
Start: 2016-05-24 — End: 2016-06-07
  Administered 2016-05-25 – 2016-06-02 (×15): 1 [drp] via OPHTHALMIC
  Administered 2016-06-02: 16:00:00 via OPHTHALMIC
  Administered 2016-06-03 – 2016-06-07 (×9): 1 [drp] via OPHTHALMIC
  Filled 2016-05-24 (×29): qty 1

## 2016-05-24 MED ORDER — POTASSIUM CHLORIDE CRYS ER 20 MEQ PO TBCR
40.0000 meq | EXTENDED_RELEASE_TABLET | Freq: Once | ORAL | Status: AC
  Administered 2016-05-24: 40 meq via ORAL
  Filled 2016-05-24: qty 2

## 2016-05-24 MED ORDER — SODIUM CHLORIDE 0.9 % IV BOLUS (SEPSIS)
500.0000 mL | Freq: Once | INTRAVENOUS | Status: AC
  Administered 2016-05-24: 500 mL via INTRAVENOUS

## 2016-05-24 MED ORDER — DILTIAZEM HCL 25 MG/5ML IV SOLN
10.0000 mg | Freq: Once | INTRAVENOUS | Status: AC
  Administered 2016-05-24: 10 mg via INTRAVENOUS
  Filled 2016-05-24: qty 5

## 2016-05-24 MED ORDER — FENTANYL CITRATE (PF) 100 MCG/2ML IJ SOLN
25.0000 ug | Freq: Once | INTRAMUSCULAR | Status: AC
Start: 2016-05-24 — End: 2016-05-24
  Administered 2016-05-24: 25 ug via INTRAVENOUS
  Filled 2016-05-24: qty 2

## 2016-05-24 MED ORDER — PANTOPRAZOLE SODIUM 40 MG PO TBEC
40.0000 mg | DELAYED_RELEASE_TABLET | Freq: Every day | ORAL | Status: DC
  Administered 2016-05-24 – 2016-05-29 (×6): 40 mg via ORAL
  Filled 2016-05-24 (×7): qty 1

## 2016-05-24 MED ORDER — SIMVASTATIN 40 MG PO TABS
40.0000 mg | ORAL_TABLET | Freq: Every day | ORAL | Status: DC
  Administered 2016-05-24: 40 mg via ORAL
  Filled 2016-05-24: qty 1

## 2016-05-24 MED ORDER — FENTANYL CITRATE (PF) 100 MCG/2ML IJ SOLN
25.0000 ug | Freq: Once | INTRAMUSCULAR | Status: AC
  Administered 2016-05-24: 25 ug via INTRAVENOUS

## 2016-05-24 MED ORDER — POTASSIUM CHLORIDE CRYS ER 20 MEQ PO TBCR: 40.0000 meq | EXTENDED_RELEASE_TABLET | Freq: Once | ORAL | Status: DC

## 2016-05-24 MED ORDER — MAGNESIUM 200 MG PO TABS
250.0000 mg | ORAL_TABLET | Freq: Every day | ORAL | Status: DC
  Filled 2016-05-24: qty 2

## 2016-05-24 MED ORDER — FENTANYL CITRATE (PF) 100 MCG/2ML IJ SOLN
25.0000 ug | Freq: Once | INTRAMUSCULAR | Status: AC
  Administered 2016-05-24: 25 ug via INTRAVENOUS
  Filled 2016-05-24: qty 2

## 2016-05-24 MED ORDER — FENTANYL CITRATE (PF) 100 MCG/2ML IJ SOLN
25.0000 ug | INTRAMUSCULAR | Status: DC | PRN
  Administered 2016-05-25 – 2016-05-28 (×20): 25 ug via INTRAVENOUS
  Filled 2016-05-24 (×19): qty 2

## 2016-05-24 MED ORDER — TRIAMTERENE-HCTZ 37.5-25 MG PO TABS
1.0000 | ORAL_TABLET | Freq: Every day | ORAL | Status: DC
  Administered 2016-05-25 – 2016-05-26 (×2): 1 via ORAL
  Filled 2016-05-24 (×2): qty 1

## 2016-05-24 MED ORDER — ADULT MULTIVITAMIN W/MINERALS CH
1.0000 | ORAL_TABLET | Freq: Every day | ORAL | Status: DC
  Administered 2016-05-27 – 2016-06-06 (×3): 1 via ORAL
  Filled 2016-05-24 (×12): qty 1

## 2016-05-24 MED ORDER — FENTANYL CITRATE (PF) 100 MCG/2ML IJ SOLN
INTRAMUSCULAR | Status: AC
  Filled 2016-05-24: qty 2

## 2016-05-24 MED ORDER — DILTIAZEM HCL 100 MG IV SOLR
5.0000 mg/h | INTRAVENOUS | Status: DC
  Administered 2016-05-24: 5 mg/h via INTRAVENOUS
  Filled 2016-05-24 (×2): qty 100

## 2016-05-24 MED ORDER — POTASSIUM CHLORIDE 10 MEQ/100ML IV SOLN
10.0000 meq | INTRAVENOUS | Status: AC
  Administered 2016-05-24 (×2): 10 meq via INTRAVENOUS
  Filled 2016-05-24 (×2): qty 100
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
## 2016-05-24 NOTE — ED Notes (Signed)
Pt on monitor 

## 2016-05-25 ENCOUNTER — Inpatient Hospital Stay (HOSPITAL_COMMUNITY): Payer: Medicare HMO

## 2016-05-25 ENCOUNTER — Encounter (HOSPITAL_COMMUNITY): Payer: Self-pay | Admitting: Cardiology

## 2016-05-25 DIAGNOSIS — M545 Low back pain, unspecified: Secondary | ICD-10-CM

## 2016-05-25 DIAGNOSIS — R7989 Other specified abnormal findings of blood chemistry: Secondary | ICD-10-CM

## 2016-05-25 DIAGNOSIS — I10 Essential (primary) hypertension: Secondary | ICD-10-CM

## 2016-05-25 DIAGNOSIS — E785 Hyperlipidemia, unspecified: Secondary | ICD-10-CM

## 2016-05-25 DIAGNOSIS — E876 Hypokalemia: Secondary | ICD-10-CM

## 2016-05-25 DIAGNOSIS — I4891 Unspecified atrial fibrillation: Secondary | ICD-10-CM

## 2016-05-25 LAB — D-DIMER, QUANTITATIVE: D-Dimer, Quant: 1.31 ug/mL-FEU — ABNORMAL HIGH (ref 0.00–0.50)

## 2016-05-25 LAB — PROTIME-INR
INR: 1.06
Prothrombin Time: 13.8 seconds (ref 11.4–15.2)

## 2016-05-25 LAB — BASIC METABOLIC PANEL
Anion gap: 10 (ref 5–15)
BUN: 10 mg/dL (ref 6–20)
CO2: 28 mmol/L (ref 22–32)
Calcium: 8.6 mg/dL — ABNORMAL LOW (ref 8.9–10.3)
Chloride: 100 mmol/L — ABNORMAL LOW (ref 101–111)
Creatinine, Ser: 0.79 mg/dL (ref 0.44–1.00)
GFR calc Af Amer: >60 mL/min (ref >60)
GFR calc non Af Amer: >60 mL/min (ref >60)
Glucose, Bld: 108 mg/dL — ABNORMAL HIGH (ref 65–99)
Potassium: 3.5 mmol/L (ref 3.5–5.1)
Sodium: 138 mmol/L (ref 135–145)

## 2016-05-25 LAB — TROPONIN I
Troponin I: 0.06 ng/mL (ref <0.03)
Troponin I: 0.05 ng/mL (ref <0.03)

## 2016-05-25 LAB — HEPARIN LEVEL (UNFRACTIONATED): Heparin Unfractionated: 0.58 IU/mL (ref 0.30–0.70)

## 2016-05-25 MED ORDER — SIMVASTATIN 40 MG PO TABS: 40.0000 mg | ORAL_TABLET | Freq: Every day | ORAL | Status: DC

## 2016-05-25 MED ORDER — METOPROLOL TARTRATE 25 MG PO TABS
25.0000 mg | ORAL_TABLET | Freq: Two times a day (BID) | ORAL | Status: DC
  Administered 2016-05-25: 25 mg via ORAL
  Filled 2016-05-25: qty 1

## 2016-05-25 MED ORDER — HEPARIN (PORCINE) IN NACL 100-0.45 UNIT/ML-% IJ SOLN
1150.0000 [IU]/h | INTRAMUSCULAR | Status: DC
  Administered 2016-05-25 – 2016-05-28 (×3): 1000 [IU]/h via INTRAVENOUS
  Filled 2016-05-25 (×4): qty 250

## 2016-05-25 MED ORDER — DILTIAZEM HCL ER COATED BEADS 120 MG PO CP24
120.0000 mg | ORAL_CAPSULE | Freq: Every day | ORAL | Status: DC
  Administered 2016-05-25 – 2016-05-29 (×5): 120 mg via ORAL
  Filled 2016-05-25 (×7): qty 1

## 2016-05-25 MED ORDER — ACETAMINOPHEN 650 MG RE SUPP: 650.0000 mg | Freq: Four times a day (QID) | RECTAL | Status: DC | PRN

## 2016-05-25 MED ORDER — ONDANSETRON HCL 4 MG/2ML IJ SOLN
4.0000 mg | Freq: Four times a day (QID) | INTRAMUSCULAR | Status: DC | PRN
  Administered 2016-05-25 – 2016-05-27 (×3): 4 mg via INTRAVENOUS
  Filled 2016-05-25 (×3): qty 2

## 2016-05-25 MED ORDER — ATORVASTATIN CALCIUM 20 MG PO TABS
20.0000 mg | ORAL_TABLET | Freq: Every day | ORAL | Status: DC
  Administered 2016-05-25 – 2016-05-28 (×4): 20 mg via ORAL
  Filled 2016-05-25 (×4): qty 1

## 2016-05-25 MED ORDER — DILTIAZEM HCL-DEXTROSE 100-5 MG/100ML-% IV SOLN (PREMIX)
5.0000 mg/h | INTRAVENOUS | Status: DC
  Administered 2016-05-25: 15 mg/h via INTRAVENOUS
  Administered 2016-05-25: 5 mg/h via INTRAVENOUS
  Administered 2016-05-25: 10 mg/h via INTRAVENOUS
  Filled 2016-05-25: qty 100

## 2016-05-25 MED ORDER — IOPAMIDOL (ISOVUE-370) INJECTION 76%
INTRAVENOUS | Status: AC
  Administered 2016-05-25: 80 mL
  Filled 2016-05-25: qty 100

## 2016-05-25 MED ORDER — METOPROLOL TARTRATE 12.5 MG HALF TABLET
12.5000 mg | ORAL_TABLET | Freq: Two times a day (BID) | ORAL | Status: DC
  Administered 2016-05-25 – 2016-05-26 (×2): 12.5 mg via ORAL
  Filled 2016-05-25 (×2): qty 1

## 2016-05-25 MED ORDER — HEPARIN BOLUS VIA INFUSION
4000.0000 [IU] | Freq: Once | INTRAVENOUS | Status: AC
  Administered 2016-05-25: 4000 [IU] via INTRAVENOUS
  Filled 2016-05-25: qty 4000

## 2016-05-25 MED ORDER — POTASSIUM CHLORIDE CRYS ER 20 MEQ PO TBCR
40.0000 meq | EXTENDED_RELEASE_TABLET | Freq: Once | ORAL | Status: AC
Start: 2016-05-25 — End: 2016-05-25
  Administered 2016-05-25: 40 meq via ORAL
  Filled 2016-05-25: qty 2

## 2016-05-25 MED ORDER — ACETAMINOPHEN 325 MG PO TABS
650.0000 mg | ORAL_TABLET | Freq: Four times a day (QID) | ORAL | Status: DC | PRN
  Administered 2016-05-25 – 2016-05-28 (×8): 650 mg via ORAL
  Filled 2016-05-25 (×10): qty 2
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
## 2016-05-25 NOTE — Progress Notes (Signed)
CCMD notified RN that patient converted to NSR at 0016.
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
## 2016-05-26 ENCOUNTER — Inpatient Hospital Stay (HOSPITAL_COMMUNITY): Payer: Medicare HMO

## 2016-05-26 DIAGNOSIS — R7989 Other specified abnormal findings of blood chemistry: Secondary | ICD-10-CM

## 2016-05-26 LAB — CBC
HCT: 28.2 % — ABNORMAL LOW (ref 36.0–46.0)
Hemoglobin: 8.5 g/dL — ABNORMAL LOW (ref 12.0–15.0)
MCH: 25.9 pg — ABNORMAL LOW (ref 26.0–34.0)
MCHC: 30.1 g/dL (ref 30.0–36.0)
MCV: 86 fL (ref 78.0–100.0)
Platelets: 219 10*3/uL (ref 150–400)
RBC: 3.28 MIL/uL — ABNORMAL LOW (ref 3.87–5.11)
RDW: 16.3 % — ABNORMAL HIGH (ref 11.5–15.5)
WBC: 8.2 10*3/uL (ref 4.0–10.5)

## 2016-05-26 LAB — BASIC METABOLIC PANEL
Anion gap: 9 (ref 5–15)
BUN: 8 mg/dL (ref 6–20)
CO2: 27 mmol/L (ref 22–32)
Calcium: 8.8 mg/dL — ABNORMAL LOW (ref 8.9–10.3)
Chloride: 99 mmol/L — ABNORMAL LOW (ref 101–111)
Creatinine, Ser: 0.78 mg/dL (ref 0.44–1.00)
GFR calc Af Amer: >60 mL/min (ref >60)
GFR calc non Af Amer: >60 mL/min (ref >60)
Glucose, Bld: 86 mg/dL (ref 65–99)
Potassium: 3.9 mmol/L (ref 3.5–5.1)
Sodium: 135 mmol/L (ref 135–145)

## 2016-05-26 LAB — HEPARIN LEVEL (UNFRACTIONATED): Heparin Unfractionated: 0.58 IU/mL (ref 0.30–0.70)

## 2016-05-26 MED ORDER — AMIODARONE HCL 200 MG PO TABS
200.0000 mg | ORAL_TABLET | Freq: Two times a day (BID) | ORAL | Status: DC
  Administered 2016-05-26 – 2016-05-29 (×8): 200 mg via ORAL
  Filled 2016-05-26 (×8): qty 1

## 2016-05-26 MED ORDER — DOCUSATE SODIUM 100 MG PO CAPS
100.0000 mg | ORAL_CAPSULE | Freq: Every day | ORAL | Status: DC | PRN
Start: 2016-05-26 — End: 2016-05-28
  Administered 2016-05-26 – 2016-05-27 (×2): 100 mg via ORAL
  Filled 2016-05-26 (×2): qty 1

## 2016-05-26 MED ORDER — ALUM & MAG HYDROXIDE-SIMETH 200-200-20 MG/5ML PO SUSP
15.0000 mL | ORAL | Status: DC | PRN
  Administered 2016-05-26 – 2016-05-28 (×2): 15 mL via ORAL
  Filled 2016-05-26 (×2): qty 30

## 2016-05-26 MED ORDER — METOPROLOL TARTRATE 25 MG PO TABS
25.0000 mg | ORAL_TABLET | Freq: Two times a day (BID) | ORAL | Status: DC
  Administered 2016-05-26 – 2016-05-29 (×7): 25 mg via ORAL
  Filled 2016-05-26 (×7): qty 1

## 2016-05-26 MED ORDER — BUTALBITAL-APAP-CAFFEINE 50-325-40 MG PO TABS: 1.0000 | ORAL_TABLET | Freq: Four times a day (QID) | ORAL | Status: DC | PRN

## 2016-05-26 NOTE — Progress Notes (Signed)
*  PRELIMINARY RESULTS* Vascular Ultrasound Lower extremity venous duplex has been completed.  Preliminary findings: No evidence of DVT or baker's cyst.  Farrel Demark, RDMS, RVT  05/26/2016, 10:36 AM
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
## 2016-05-26 NOTE — Progress Notes (Signed)
Pt seen and examined.  No issues overnight. Back pain well controlled Denies new or worsening of sx Denies bowel or bladder dysfunction  EXAM: Temp:  [97.7 F (36.5 C)-100.1 F (37.8 C)] 98.7 F (37.1 C) (04/07 0740) Pulse Rate:  [62-92] 74 (04/07 0459) Resp:  [15-32] 20 (04/06 2337) BP: (101-133)/(70-83) 118/70 (04/07 0740) SpO2:  [96 %-98 %] 96 % (04/07 0459) Weight:  [77.1 kg (169 lb 14.4 oz)] 77.1 kg (169 lb 14.4 oz) (04/07 0459) Intake/Output      04/06 0701 - 04/07 0700 04/07 0701 - 04/08 0700   P.O. 420    I.V. (mL/kg) 284.7 (3.7)    Total Intake(mL/kg) 704.7 (9.1)    Urine (mL/kg/hr) 1600 (0.9)    Stool 0 (0)    Total Output 1600     Net -895.3          Urine Occurrence 6 x    Stool Occurrence 1 x     Awake and alert Follows commands throughout MAE well Sensation grossly intact to LT  Stable Continue current care

## 2016-05-26 NOTE — Progress Notes (Addendum)
Subjective:  She had a CT angiogram yesterday that did not show any significant pulmonary emboli.  She does have significant calcification of all 3 of her coronary arteries as well as her aorta.  She has had some continued paroysmal atrial fibrillation noted on monitoring.    Objective:  Vital Signs in the last 24 hours: BP 118/70 (BP Location: Left Arm)   Pulse 74   Temp 98.7 F (37.1 C) (Oral)   Resp 20   Ht 5\' 4"  (1.626 m)   Wt 77.1 kg (169 lb 14.4 oz)   SpO2 96%   BMI 29.16 kg/m   Physical Exam: Obese white female currently in no acute distress Lungs:  Clear Cardiac:  Regular rhythm, normal S1 and S2, no S3 Abdomen:  Soft, nontender, no masses Extremities:  No edema present  Intake/Output from previous day: 04/06 0701 - 04/07 0700 In: 704.7 [P.O.:420; I.V.:284.7] Out: 1600 [Urine:1600]  Weight Filed Weights   05/24/16 1329 05/25/16 0438 05/26/16 0459  Weight: 77.1 kg (170 lb) 77.9 kg (171 lb 12.8 oz) 77.1 kg (169 lb 14.4 oz)    Lab Results: Basic Metabolic Panel:  Recent Labs  03/49/17 0408 05/26/16 0408  NA 138 135  K 3.5 3.9  CL 100* 99*  CO2 28 27  GLUCOSE 108* 86  BUN 10 8  CREATININE 0.79 0.78   CBC:  Recent Labs  05/24/16 1433 05/26/16 0408  WBC 10.9* 8.2  NEUTROABS 7.9*  --   HGB 9.8* 8.5*  HCT 31.0* 28.2*  MCV 85.9 86.0  PLT 327 219   Cardiac Panel (last 3 results)  Recent Labs  05/24/16 2141 05/25/16 0408 05/25/16 0847  TROPONINI 0.06* 0.06* 0.05*    Telemetry: Sinus rhythm with PACs, occasional episodes of paroxysmal atrial fibrillation are noted even today.    Assessment/Plan:  1.  Paroxysmal atrial fibrillation with recurrence 2.  Acute back fracture likely need of surgery 3.  Three-vessel coronary artery disease as manifested by coronary calcifications 4.  Recent onset of dyspnea 5.  Anemia  Recommendations:  She is continuing to have some paroxysms of atrial fibrillation.  I think he would be best to put her on  prophylactic amiodarone prior to surgery.  I will go ahead and initiate this.     Darden Palmer  MD [facility]  05/26/2016, 10:36 AM
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
## 2016-05-26 NOTE — Progress Notes (Signed)
CCMD notified RN that Pt had 7 beats of SVT. RN went to check on patient. Pt alert and denies symptoms. Will continue to monitor.

## 2016-05-27 ENCOUNTER — Inpatient Hospital Stay (HOSPITAL_COMMUNITY): Payer: Medicare HMO

## 2016-05-27 DIAGNOSIS — I4891 Unspecified atrial fibrillation: Secondary | ICD-10-CM

## 2016-05-27 LAB — CBC
HCT: 29 % — ABNORMAL LOW (ref 36.0–46.0)
Hemoglobin: 8.8 g/dL — ABNORMAL LOW (ref 12.0–15.0)
MCH: 25.7 pg — ABNORMAL LOW (ref 26.0–34.0)
MCHC: 30.3 g/dL (ref 30.0–36.0)
MCV: 84.5 fL (ref 78.0–100.0)
Platelets: 197 10*3/uL (ref 150–400)
RBC: 3.43 MIL/uL — ABNORMAL LOW (ref 3.87–5.11)
RDW: 16.1 % — ABNORMAL HIGH (ref 11.5–15.5)
WBC: 7.3 10*3/uL (ref 4.0–10.5)

## 2016-05-27 LAB — ECHOCARDIOGRAM COMPLETE
Height: 64 in
Weight: 2702.4 oz

## 2016-05-27 LAB — GLUCOSE, CAPILLARY: Glucose-Capillary: 103 mg/dL — ABNORMAL HIGH (ref 65–99)

## 2016-05-27 LAB — HEPARIN LEVEL (UNFRACTIONATED): Heparin Unfractionated: 0.46 IU/mL (ref 0.30–0.70)

## 2016-05-27 MED ORDER — BISACODYL 10 MG RE SUPP
10.0000 mg | Freq: Every day | RECTAL | Status: DC | PRN
  Filled 2016-05-27: qty 1
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
## 2016-05-27 NOTE — Progress Notes (Signed)
Pt seen and examined.  No issues overnight. Reports pain has been improving Tolerating po Denies new sx Feeling well this morning  EXAM: Temp:  [98.4 F (36.9 C)-99.5 F (37.5 C)] 99.5 F (37.5 C) (04/08 0258) Pulse Rate:  [71-91] 73 (04/08 0258) Resp:  [19-23] 23 (04/08 0258) BP: (118-141)/(62-83) 127/62 (04/08 0258) SpO2:  [95 %-100 %] 100 % (04/08 0258) Weight:  [76.6 kg (168 lb 14.4 oz)] 76.6 kg (168 lb 14.4 oz) (04/08 0258) Intake/Output      04/07 0701 - 04/08 0700 04/08 0701 - 04/09 0700   P.O. 720    I.V. (mL/kg) 40 (0.5)    Total Intake(mL/kg) 760 (9.9)    Urine (mL/kg/hr)     Stool     Total Output       Net +760          Urine Occurrence 2 x     Awake and alert Follows commands throughout Strength appropriate Sensation grossly intact to LT  Stable.  Continue current care Dr Wynetta Emery to see pt tomorrow to discuss surgery once given Cardiology clearance

## 2016-05-27 NOTE — Plan of Care (Signed)
Problem: Pain Managment: Goal: General experience of comfort will improve Outcome: Progressing Patient has PRN scheduled pain medication. Patient uses her call bell to let RN know when she is in need .

## 2016-05-28 DIAGNOSIS — I25119 Atherosclerotic heart disease of native coronary artery with unspecified angina pectoris: Secondary | ICD-10-CM

## 2016-05-28 LAB — CBC
HCT: 28.6 % — ABNORMAL LOW (ref 36.0–46.0)
Hemoglobin: 8.9 g/dL — ABNORMAL LOW (ref 12.0–15.0)
MCH: 26.4 pg (ref 26.0–34.0)
MCHC: 31.1 g/dL (ref 30.0–36.0)
MCV: 84.9 fL (ref 78.0–100.0)
Platelets: 176 10*3/uL (ref 150–400)
RBC: 3.37 MIL/uL — ABNORMAL LOW (ref 3.87–5.11)
RDW: 16.2 % — ABNORMAL HIGH (ref 11.5–15.5)
WBC: 7.5 10*3/uL (ref 4.0–10.5)

## 2016-05-28 LAB — MAGNESIUM: Magnesium: 1.9 mg/dL (ref 1.7–2.4)

## 2016-05-28 LAB — BASIC METABOLIC PANEL
Anion gap: 10 (ref 5–15)
BUN: 9 mg/dL (ref 6–20)
CO2: 27 mmol/L (ref 22–32)
Calcium: 9 mg/dL (ref 8.9–10.3)
Chloride: 97 mmol/L — ABNORMAL LOW (ref 101–111)
Creatinine, Ser: 0.84 mg/dL (ref 0.44–1.00)
GFR calc Af Amer: >60 mL/min (ref >60)
GFR calc non Af Amer: >60 mL/min (ref >60)
Glucose, Bld: 108 mg/dL — ABNORMAL HIGH (ref 65–99)
Potassium: 3.9 mmol/L (ref 3.5–5.1)
Sodium: 134 mmol/L — ABNORMAL LOW (ref 135–145)

## 2016-05-28 LAB — HEPARIN LEVEL (UNFRACTIONATED): Heparin Unfractionated: 0.34 IU/mL (ref 0.30–0.70)

## 2016-05-28 MED ORDER — ASPIRIN 81 MG PO CHEW
81.0000 mg | CHEWABLE_TABLET | ORAL | Status: AC
  Administered 2016-05-29: 81 mg via ORAL
  Filled 2016-05-28: qty 1

## 2016-05-28 MED ORDER — SODIUM CHLORIDE 0.9 % WEIGHT BASED INFUSION
1.0000 mL/kg/h | INTRAVENOUS | Status: DC
  Administered 2016-05-29: 1 mL/kg/h via INTRAVENOUS

## 2016-05-28 MED ORDER — DOCUSATE SODIUM 100 MG PO CAPS
100.0000 mg | ORAL_CAPSULE | Freq: Every day | ORAL | Status: DC
  Administered 2016-05-28 – 2016-05-29 (×2): 100 mg via ORAL
  Filled 2016-05-28 (×2): qty 1

## 2016-05-28 MED ORDER — SODIUM CHLORIDE 0.9% FLUSH: 3.0000 mL | INTRAVENOUS | Status: DC | PRN

## 2016-05-28 MED ORDER — SODIUM CHLORIDE 0.9 % IV SOLN: 250.0000 mL | INTRAVENOUS | Status: DC | PRN

## 2016-05-28 MED ORDER — HYDROCODONE-ACETAMINOPHEN 5-325 MG PO TABS
1.0000 | ORAL_TABLET | Freq: Four times a day (QID) | ORAL | Status: DC | PRN
  Administered 2016-05-28 – 2016-05-29 (×4): 2 via ORAL
  Filled 2016-05-28 (×4): qty 2

## 2016-05-28 MED ORDER — ZOLPIDEM TARTRATE 5 MG PO TABS
5.0000 mg | ORAL_TABLET | Freq: Every evening | ORAL | Status: DC | PRN
  Administered 2016-05-28 – 2016-05-29 (×2): 5 mg via ORAL
  Filled 2016-05-28 (×2): qty 1

## 2016-05-28 MED ORDER — SODIUM CHLORIDE 0.9 % WEIGHT BASED INFUSION
3.0000 mL/kg/h | INTRAVENOUS | Status: DC
  Administered 2016-05-29: 3 mL/kg/h via INTRAVENOUS

## 2016-05-28 MED ORDER — SODIUM CHLORIDE 0.9% FLUSH
3.0000 mL | Freq: Two times a day (BID) | INTRAVENOUS | Status: DC
  Administered 2016-05-29: 3 mL via INTRAVENOUS
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
## 2016-05-28 NOTE — Progress Notes (Signed)
Patient ID: Erin [name], female   DOB: 06-10-1945, 71 y.o.   MRN: [id number] Patient well pain manageable  Awake alert neurologically nonfocal  Await determination on cardiology clearance plan possible surgical stabilization on Wednesday pending clearance from cardiology.

## 2016-05-28 NOTE — Progress Notes (Signed)
ANTICOAGULATION CONSULT NOTE  Pharmacy Consult for heparin Indication: atrial fibrillation  Allergies  Allergen Reactions  . Ace Inhibitors Swelling    ACE stopped after pt seen in ED with facial swelling- allergy testing pending  . Codeine Nausea And Vomiting    In large quantities     Patient Measurements: Height: 5\' 4"  (162.6 cm) Weight: 168 lb 6.4 oz (76.4 kg) IBW/kg (Calculated) : 54.7 Heparin Dosing Weight: 71.2 kg  Vital Signs: Temp: 99.1 F (37.3 C) (04/09 0804) Temp Source: Oral (04/09 0804) BP: 112/74 (04/09 0804) Pulse Rate: 77 (04/09 0804)  Labs:  Recent Labs  05/25/16 1035  05/26/16 0408 05/27/16 0504 05/28/16 0424  HGB  --   < > 8.5* 8.8* 8.9*  HCT  --   --  28.2* 29.0* 28.6*  PLT  --   --  219 197 176  LABPROT 13.8  --   --   --   --   INR 1.06  --   --   --   --   HEPARINUNFRC  --   < > 0.58 0.46 0.34  CREATININE  --   --  0.78  --  0.84  < > = values in this interval not displayed.  Estimated Creatinine Clearance: 60.6 mL/min (by C-G formula based on SCr of 0.84 mg/dL).   Medical History: Past Medical History:  Diagnosis Date  . Arthritis    "all my back is eat up w/it; knees too" (05/24/2016)  . Chronic bronchitis (HCC)   . Chronic lower back pain   . Dyspnea    "since OR 04/2016" (05/24/2016)  . Family history of adverse reaction to anesthesia    "daughter gets PONV" (05/24/2016)  . GERD (gastroesophageal reflux disease)    occ  . High cholesterol   . History of stomach ulcers   . Hypertension   . Hypothyroidism   . Migraine    "usually have one monthly; nothing since 04/25/2016)  . Pneumonia ~ 2002    Assessment: 71 yo female on IV heparin gtt for new onset AFib > converted on own to NSR.  -HL 0.34 on 1000 units/hr, cath 4/10 prior to her back surgery 4/11. hgb 8.9, pltc 176K, stable. No bleeding noted per chart.  Goal of Therapy:  Heparin level 0.3-0.7 units/ml Monitor platelets by anticoagulation protocol: Yes  Plan:  -Heparin  at 1000 units/hr -Daily heparin level, CBC -Monitor for s/sx bleeding  Bayard Hugger, PharmD, BCPS  Clinical Pharmacist  Pager: [phone]   05/28/16 8:49 AM

## 2016-05-28 NOTE — Care Management Important Message (Signed)
Important Message  Patient Details  Name: Erin [name] MRN: [id number] Date of Birth: 08-19-1945   Medicare Important Message Given:  Yes    [name] 05/28/2016, 4:38 PM
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
## 2016-05-29 ENCOUNTER — Encounter (HOSPITAL_COMMUNITY): Payer: Self-pay | Admitting: Cardiovascular Disease

## 2016-05-29 ENCOUNTER — Other Ambulatory Visit: Payer: Self-pay | Admitting: *Deleted

## 2016-05-29 ENCOUNTER — Encounter (HOSPITAL_COMMUNITY)
Admission: EM | Disposition: A | Discharge status: Skilled Nursing Facility | Payer: Self-pay | Source: Home / Self Care | Attending: Thoracic Surgery (Cardiothoracic Vascular Surgery)

## 2016-05-29 ENCOUNTER — Other Ambulatory Visit: Payer: Self-pay | Admitting: Neurosurgery

## 2016-05-29 DIAGNOSIS — I25110 Atherosclerotic heart disease of native coronary artery with unstable angina pectoris: Secondary | ICD-10-CM

## 2016-05-29 DIAGNOSIS — I2510 Atherosclerotic heart disease of native coronary artery without angina pectoris: Principal | ICD-10-CM

## 2016-05-29 DIAGNOSIS — I482 Chronic atrial fibrillation: Secondary | ICD-10-CM

## 2016-05-29 HISTORY — PX: LEFT HEART CATH AND CORONARY ANGIOGRAPHY: CATH118249

## 2016-05-29 LAB — CBC
HCT: 27.3 % — ABNORMAL LOW (ref 36.0–46.0)
Hemoglobin: 8.4 g/dL — ABNORMAL LOW (ref 12.0–15.0)
MCH: 26 pg (ref 26.0–34.0)
MCHC: 30.8 g/dL (ref 30.0–36.0)
MCV: 84.5 fL (ref 78.0–100.0)
Platelets: 162 10*3/uL (ref 150–400)
RBC: 3.23 MIL/uL — ABNORMAL LOW (ref 3.87–5.11)
RDW: 16.1 % — ABNORMAL HIGH (ref 11.5–15.5)
WBC: 6.3 10*3/uL (ref 4.0–10.5)

## 2016-05-29 LAB — BASIC METABOLIC PANEL
Anion gap: 9 (ref 5–15)
BUN: 7 mg/dL (ref 6–20)
CO2: 25 mmol/L (ref 22–32)
Calcium: 8.8 mg/dL — ABNORMAL LOW (ref 8.9–10.3)
Chloride: 99 mmol/L — ABNORMAL LOW (ref 101–111)
Creatinine, Ser: 0.74 mg/dL (ref 0.44–1.00)
GFR calc Af Amer: >60 mL/min (ref >60)
GFR calc non Af Amer: >60 mL/min (ref >60)
Glucose, Bld: 104 mg/dL — ABNORMAL HIGH (ref 65–99)
Potassium: 4.2 mmol/L (ref 3.5–5.1)
Sodium: 133 mmol/L — ABNORMAL LOW (ref 135–145)

## 2016-05-29 LAB — HEPARIN LEVEL (UNFRACTIONATED): Heparin Unfractionated: 0.29 IU/mL — ABNORMAL LOW (ref 0.30–0.70)

## 2016-05-29 SURGERY — LEFT HEART CATH AND CORONARY ANGIOGRAPHY
Anesthesia: LOCAL

## 2016-05-29 MED ORDER — NITROGLYCERIN IN D5W 200-5 MCG/ML-% IV SOLN
2.0000 ug/min | INTRAVENOUS | Status: AC
Start: 2016-05-30 — End: 2016-05-30
  Administered 2016-05-30: 10 ug/min via INTRAVENOUS
  Filled 2016-05-29: qty 250

## 2016-05-29 MED ORDER — SODIUM CHLORIDE 0.9 % IV SOLN
INTRAVENOUS | Status: AC
  Administered 2016-05-30: .8 [IU]/h via INTRAVENOUS
  Filled 2016-05-29: qty 2.5

## 2016-05-29 MED ORDER — EPINEPHRINE PF 1 MG/ML IJ SOLN
0.0000 ug/min | INTRAVENOUS | Status: DC
  Filled 2016-05-29: qty 4

## 2016-05-29 MED ORDER — HEPARIN SODIUM (PORCINE) 1000 UNIT/ML IJ SOLN
INTRAMUSCULAR | Status: AC
  Filled 2016-05-29: qty 1

## 2016-05-29 MED ORDER — MIDAZOLAM HCL 2 MG/2ML IJ SOLN
INTRAMUSCULAR | Status: DC | PRN
  Administered 2016-05-29: 2 mg via INTRAVENOUS

## 2016-05-29 MED ORDER — HEPARIN (PORCINE) IN NACL 2-0.9 UNIT/ML-% IJ SOLN
INTRAMUSCULAR | Status: DC | PRN
Start: 2016-05-29 — End: 2016-05-29
  Administered 2016-05-29: 1000 mL via INTRA_ARTERIAL

## 2016-05-29 MED ORDER — SODIUM CHLORIDE 0.9 % IV SOLN
INTRAVENOUS | Status: DC
  Filled 2016-05-29: qty 30

## 2016-05-29 MED ORDER — DEXMEDETOMIDINE HCL IN NACL 400 MCG/100ML IV SOLN
0.1000 ug/kg/h | INTRAVENOUS | Status: AC
  Administered 2016-05-30: .3 ug/kg/h via INTRAVENOUS
  Filled 2016-05-29: qty 100

## 2016-05-29 MED ORDER — BISACODYL 5 MG PO TBEC: 5.0000 mg | DELAYED_RELEASE_TABLET | Freq: Once | ORAL | Status: DC

## 2016-05-29 MED ORDER — CHLORHEXIDINE GLUCONATE CLOTH 2 % EX PADS
6.0000 | MEDICATED_PAD | Freq: Once | CUTANEOUS | Status: AC
Start: 2016-05-29 — End: 2016-05-30
  Administered 2016-05-30: 6 via TOPICAL

## 2016-05-29 MED ORDER — DIAZEPAM 2 MG PO TABS
2.0000 mg | ORAL_TABLET | Freq: Once | ORAL | Status: AC
  Administered 2016-05-30: 2 mg via ORAL
  Filled 2016-05-29: qty 1

## 2016-05-29 MED ORDER — DIAZEPAM 5 MG PO TABS: 5.0000 mg | ORAL_TABLET | Freq: Four times a day (QID) | ORAL | Status: DC | PRN

## 2016-05-29 MED ORDER — TEMAZEPAM 15 MG PO CAPS: 15.0000 mg | ORAL_CAPSULE | Freq: Once | ORAL | Status: DC | PRN

## 2016-05-29 MED ORDER — FENTANYL CITRATE (PF) 100 MCG/2ML IJ SOLN
INTRAMUSCULAR | Status: DC | PRN
  Administered 2016-05-29: 50 ug via INTRAVENOUS

## 2016-05-29 MED ORDER — SODIUM CHLORIDE 0.9% FLUSH: 3.0000 mL | Freq: Two times a day (BID) | INTRAVENOUS | Status: DC

## 2016-05-29 MED ORDER — DEXTROSE 5 % IV SOLN
1.5000 g | INTRAVENOUS | Status: AC
  Administered 2016-05-30: 1500 mg via INTRAVENOUS
  Administered 2016-05-30: 750 mg via INTRAVENOUS
  Filled 2016-05-29: qty 1.5

## 2016-05-29 MED ORDER — OXYCODONE HCL ER 10 MG PO T12A
10.0000 mg | EXTENDED_RELEASE_TABLET | Freq: Two times a day (BID) | ORAL | Status: DC
  Administered 2016-05-29: 10 mg via ORAL
  Filled 2016-05-29: qty 1

## 2016-05-29 MED ORDER — HEPARIN (PORCINE) IN NACL 100-0.45 UNIT/ML-% IJ SOLN
1150.0000 [IU]/h | INTRAMUSCULAR | Status: DC
  Administered 2016-05-29: 1150 [IU]/h via INTRAVENOUS
  Filled 2016-05-29: qty 250

## 2016-05-29 MED ORDER — ACETAMINOPHEN 325 MG PO TABS: 650.0000 mg | ORAL_TABLET | ORAL | Status: DC | PRN

## 2016-05-29 MED ORDER — ATORVASTATIN CALCIUM 80 MG PO TABS
80.0000 mg | ORAL_TABLET | Freq: Every day | ORAL | Status: DC
  Administered 2016-05-29 – 2016-06-06 (×8): 80 mg via ORAL
  Filled 2016-05-29 (×8): qty 1

## 2016-05-29 MED ORDER — SODIUM CHLORIDE 0.9 % IV SOLN
INTRAVENOUS | Status: DC
  Administered 2016-05-29: 21:00:00 via INTRAVENOUS

## 2016-05-29 MED ORDER — VERAPAMIL HCL 2.5 MG/ML IV SOLN
INTRAVENOUS | Status: DC | PRN
  Administered 2016-05-29: 10 mL via INTRA_ARTERIAL

## 2016-05-29 MED ORDER — CHLORHEXIDINE GLUCONATE 0.12 % MT SOLN
15.0000 mL | Freq: Once | OROMUCOSAL | Status: AC
  Administered 2016-05-30: 15 mL via OROMUCOSAL
  Filled 2016-05-29: qty 15

## 2016-05-29 MED ORDER — SODIUM CHLORIDE 0.9 % IV SOLN
30.0000 ug/min | INTRAVENOUS | Status: AC
  Administered 2016-05-30: 20 ug/min via INTRAVENOUS
  Filled 2016-05-29: qty 2

## 2016-05-29 MED ORDER — TRANEXAMIC ACID (OHS) PUMP PRIME SOLUTION
2.0000 mg/kg | INTRAVENOUS | Status: DC
  Filled 2016-05-29: qty 1.55

## 2016-05-29 MED ORDER — IOPAMIDOL (ISOVUE-370) INJECTION 76%
INTRAVENOUS | Status: AC
  Filled 2016-05-29: qty 100

## 2016-05-29 MED ORDER — POTASSIUM CHLORIDE 2 MEQ/ML IV SOLN
80.0000 meq | INTRAVENOUS | Status: DC
  Filled 2016-05-29: qty 40

## 2016-05-29 MED ORDER — VERAPAMIL HCL 2.5 MG/ML IV SOLN
INTRAVENOUS | Status: AC
  Filled 2016-05-29: qty 2

## 2016-05-29 MED ORDER — SODIUM CHLORIDE 0.9 % IV SOLN
1250.0000 mg | INTRAVENOUS | Status: AC
  Administered 2016-05-30: 1250 mg via INTRAVENOUS
  Filled 2016-05-29: qty 1250

## 2016-05-29 MED ORDER — TRANEXAMIC ACID (OHS) BOLUS VIA INFUSION
15.0000 mg/kg | INTRAVENOUS | Status: AC
  Administered 2016-05-30: 1159.5 mg via INTRAVENOUS
  Filled 2016-05-29: qty 1160

## 2016-05-29 MED ORDER — TRANEXAMIC ACID 1000 MG/10ML IV SOLN
1.5000 mg/kg/h | INTRAVENOUS | Status: AC
  Administered 2016-05-30: 1.5 mg/kg/h via INTRAVENOUS
  Filled 2016-05-29: qty 25

## 2016-05-29 MED ORDER — ONDANSETRON HCL 4 MG/2ML IJ SOLN: 4.0000 mg | Freq: Four times a day (QID) | INTRAMUSCULAR | Status: DC | PRN

## 2016-05-29 MED ORDER — CHLORHEXIDINE GLUCONATE CLOTH 2 % EX PADS
6.0000 | MEDICATED_PAD | Freq: Once | CUTANEOUS | Status: AC
  Administered 2016-05-29: 6 via TOPICAL

## 2016-05-29 MED ORDER — FENTANYL CITRATE (PF) 100 MCG/2ML IJ SOLN
INTRAMUSCULAR | Status: AC
  Filled 2016-05-29: qty 2

## 2016-05-29 MED ORDER — SODIUM CHLORIDE 0.9 % IV SOLN
INTRAVENOUS | Status: DC
  Administered 2016-05-29: 14:00:00 via INTRAVENOUS

## 2016-05-29 MED ORDER — METOPROLOL TARTRATE 12.5 MG HALF TABLET
12.5000 mg | ORAL_TABLET | Freq: Once | ORAL | Status: AC
  Administered 2016-05-30: 12.5 mg via ORAL
  Filled 2016-05-29: qty 1

## 2016-05-29 MED ORDER — LIDOCAINE HCL (PF) 1 % IJ SOLN
INTRAMUSCULAR | Status: AC
  Filled 2016-05-29: qty 30

## 2016-05-29 MED ORDER — DEXTROSE 5 % IV SOLN
750.0000 mg | INTRAVENOUS | Status: DC
  Filled 2016-05-29: qty 750

## 2016-05-29 MED ORDER — IOPAMIDOL (ISOVUE-370) INJECTION 76%
INTRAVENOUS | Status: DC | PRN
  Administered 2016-05-29: 85 mL via INTRA_ARTERIAL

## 2016-05-29 MED ORDER — PLASMA-LYTE 148 IV SOLN
INTRAVENOUS | Status: AC
  Administered 2016-05-30: 500 mL
  Filled 2016-05-29: qty 2.5

## 2016-05-29 MED ORDER — ALPRAZOLAM 0.25 MG PO TABS: 0.2500 mg | ORAL_TABLET | ORAL | Status: DC | PRN

## 2016-05-29 MED ORDER — SODIUM CHLORIDE 0.9 % IV SOLN: INTRAVENOUS | Status: AC

## 2016-05-29 MED ORDER — MIDAZOLAM HCL 2 MG/2ML IJ SOLN
INTRAMUSCULAR | Status: AC
  Filled 2016-05-29: qty 2

## 2016-05-29 MED ORDER — DOPAMINE-DEXTROSE 3.2-5 MG/ML-% IV SOLN
0.0000 ug/kg/min | INTRAVENOUS | Status: DC
  Filled 2016-05-29: qty 250

## 2016-05-29 MED ORDER — HEPARIN (PORCINE) IN NACL 2-0.9 UNIT/ML-% IJ SOLN
INTRAMUSCULAR | Status: AC
  Filled 2016-05-29: qty 1000

## 2016-05-29 MED ORDER — SODIUM CHLORIDE 0.9% FLUSH: 3.0000 mL | INTRAVENOUS | Status: DC | PRN

## 2016-05-29 MED ORDER — MAGNESIUM SULFATE 50 % IJ SOLN
40.0000 meq | INTRAMUSCULAR | Status: DC
Start: 2016-05-30 — End: 2016-05-30
  Filled 2016-05-29: qty 10

## 2016-05-29 MED ORDER — ISOSORBIDE MONONITRATE ER 30 MG PO TB24
30.0000 mg | ORAL_TABLET | Freq: Every day | ORAL | Status: DC
  Administered 2016-05-29: 30 mg via ORAL
  Filled 2016-05-29: qty 1

## 2016-05-29 MED ORDER — HEPARIN SODIUM (PORCINE) 1000 UNIT/ML IJ SOLN
INTRAMUSCULAR | Status: DC | PRN
  Administered 2016-05-29: 3500 [IU] via INTRAVENOUS

## 2016-05-29 MED ORDER — LIDOCAINE HCL (PF) 1 % IJ SOLN
INTRAMUSCULAR | Status: DC | PRN
  Administered 2016-05-29: 2 mL via INTRADERMAL

## 2016-05-29 MED ORDER — SODIUM CHLORIDE 0.9 % IV SOLN: 250.0000 mL | INTRAVENOUS | Status: DC | PRN

## 2016-05-29 SURGICAL SUPPLY — 11 items
CATH INFINITI 5FR ANG PIGTAIL (CATHETERS) ×1 IMPLANT
CATH OPTITORQUE TIG 4.0 5F (CATHETERS) ×1 IMPLANT
DEVICE RAD COMP TR BAND LRG (VASCULAR PRODUCTS) ×1 IMPLANT
GLIDESHEATH SLEND SS 6F .021 (SHEATH) ×1 IMPLANT
GUIDEWIRE INQWIRE 1.5J.035X260 (WIRE) IMPLANT
INQWIRE 1.5J .035X260CM (WIRE) ×2
KIT HEART LEFT (KITS) ×2 IMPLANT
PACK CARDIAC CATHETERIZATION (CUSTOM PROCEDURE TRAY) ×2 IMPLANT
SYR MEDRAD MARK V 150ML (SYRINGE) ×2 IMPLANT
TRANSDUCER W/STOPCOCK (MISCELLANEOUS) ×2 IMPLANT
TUBING CIL FLEX 10 FLL-RA (TUBING) ×2 IMPLANT
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
## 2016-05-29 NOTE — Progress Notes (Signed)
ANTICOAGULATION CONSULT NOTE  Pharmacy Consult for heparin Indication: atrial fibrillation  Allergies  Allergen Reactions  . Ace Inhibitors Swelling    ACE stopped after pt seen in ED with facial swelling- allergy testing pending  . Codeine Nausea And Vomiting    In large quantities     Patient Measurements: Height: 5\' 4"  (162.6 cm) Weight: 170 lb 8 oz (77.3 kg) IBW/kg (Calculated) : 54.7 Heparin Dosing Weight: 71.2 kg  Vital Signs: Temp: 98.9 F (37.2 C) (04/10 0434) Temp Source: Oral (04/10 0434) BP: 139/65 (04/10 0434) Pulse Rate: 71 (04/10 0434)  Labs:  Recent Labs  05/27/16 0504 05/28/16 0424 05/29/16 0509  HGB 8.8* 8.9* 8.4*  HCT 29.0* 28.6* 27.3*  PLT 197 176 162  HEPARINUNFRC 0.46 0.34 0.29*  CREATININE  --  0.84  --     Estimated Creatinine Clearance: 60.9 mL/min (by C-G formula based on SCr of 0.84 mg/dL).   Assessment: 71 yo female on IV heparin gtt for new onset AFib > converted on own to NSR.  Heparin level down to slightly subtherapeutic (0.29) on 1000 units/hr, plan for cath today. Hgb low but stable. No issues with line or bleeding reported per RN.  Goal of Therapy:  Heparin level 0.3-0.7 units/ml Monitor platelets by anticoagulation protocol: Yes  Plan:  Increase heparin to 1150 units/hr F/u post cath  Christoper Fabian, PharmD, BCPS Clinical pharmacist, pager [phone] 05/29/16 6:12 AM
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
## 2016-05-29 NOTE — Progress Notes (Signed)
Paged Dr. Dorris Fetch. Unable to reach regarding doppler prior to CABG.
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
## 2016-05-29 NOTE — Progress Notes (Signed)
Received call from Dr Dorris Fetch (CT surg). Pt has 3-vessel disease requiring CABG. Surgery with Dr Wynetta Emery cancelled for tomorrow. Dr Wynetta Emery and OR notified.

## 2016-05-29 NOTE — Progress Notes (Signed)
ANTICOAGULATION CONSULT NOTE  Pharmacy Consult for heparin Indication: atrial fibrillation  Allergies  Allergen Reactions  . Ace Inhibitors Swelling    ACE stopped after pt seen in ED with facial swelling- allergy testing pending  . Codeine Nausea And Vomiting    Patient Measurements: Height: 5\' 4"  (162.6 cm) Weight: 170 lb 8 oz (77.3 kg) IBW/kg (Calculated) : 54.7 Heparin Dosing Weight: 71.2 kg  Vital Signs: Temp: 98.2 F (36.8 C) (04/10 1413) Temp Source: Oral (04/10 1413) BP: 141/71 (04/10 1413) Pulse Rate: 60 (04/10 1413)  Labs:  Recent Labs  05/27/16 0504 05/28/16 0424 05/29/16 0509  HGB 8.8* 8.9* 8.4*  HCT 29.0* 28.6* 27.3*  PLT 197 176 162  HEPARINUNFRC 0.46 0.34 0.29*  CREATININE  --  0.84 0.74    Estimated Creatinine Clearance: 63.9 mL/min (by C-G formula based on SCr of 0.74 mg/dL).   Medical History: Past Medical History:  Diagnosis Date  . Arthritis    "all my back is eat up w/it; knees too" (05/24/2016)  . Chronic bronchitis (HCC)   . Chronic lower back pain   . Dyspnea    "since OR 04/2016" (05/24/2016)  . Family history of adverse reaction to anesthesia    "daughter gets PONV" (05/24/2016)  . GERD (gastroesophageal reflux disease)    occ  . High cholesterol   . History of stomach ulcers   . Hypertension   . Hypothyroidism   . Migraine    "usually have one monthly; nothing since 04/25/2016)  . Pneumonia ~ 2002    Assessment: 71 yo female on IV heparin gtt for ACS and new onset AFib. Pending cardiac clearance for spinal surgery 4/11. Unfortunately Cath today revealed severe multivessel CAD, cardiology recommended CVTS consult for CABG prior to back surgery. Plan to restart heparin 8 hrs after sheath removal (1330). Previously on 1150 units/hr  Originally neurosurgery ordered heparin to stop at 0300 on 4/11 for back surgery, which likely need to be rescheduled now.  Goal of Therapy:  Heparin level 0.3-0.7 units/ml Monitor platelets by  anticoagulation protocol: Yes  Plan:  -restart IV heparin 1150 units/hr at 2130 with no bolus -f/u AM heparin level -Monitor for s/sx bleeding -f/u plan for spinal surgery   Bayard Hugger, PharmD, BCPS  Clinical Pharmacist  Pager: [phone]   05/29/16 3:08 PM

## 2016-05-29 NOTE — Progress Notes (Signed)
Patient ID: Erin [name], female   DOB: [birth year], 71 y.o.   MRN: [id number] Patient doing well scheduled for preoperative cardiac catheterization we'll plan surgery tomorrow we'll need to stop heparin at 3 AM for possible 11:00 start time patient will need to be made nothing by mouth after midnight.

## 2016-05-29 NOTE — Anesthesia Preprocedure Evaluation (Addendum)
Anesthesia Evaluation  Patient identified by MRN, date of birth, ID band Patient awake    Reviewed: Allergy & Precautions, NPO status , Patient's Chart, lab work & pertinent test results  Airway Mallampati: II  TM Distance: >3 FB Neck ROM: Full    Dental  (+) Teeth Intact, Dental Advisory Given   Pulmonary shortness of breath, pneumonia,    breath sounds clear to auscultation       Cardiovascular hypertension, Pt. on medications + CAD  + dysrhythmias Atrial Fibrillation  Rhythm:Regular Rate:Normal     Neuro/Psych    GI/Hepatic GERD  ,  Endo/Other  Hypothyroidism   Renal/GU      Musculoskeletal  (+) Arthritis ,   Abdominal   Peds  Hematology   Anesthesia Other Findings   Reproductive/Obstetrics                          Anesthesia Physical Anesthesia Plan  ASA: III  Anesthesia Plan: General   Post-op Pain Management:    Induction: Intravenous  Airway Management Planned: Oral ETT  Additional Equipment: PA Cath, Ultrasound Guidance Line Placement, TEE, Arterial line and CVP  Intra-op Plan:   Post-operative Plan: Post-operative intubation/ventilation  Informed Consent: I have reviewed the patients History and Physical, chart, labs and discussed the procedure including the risks, benefits and alternatives for the proposed anesthesia with the patient or authorized representative who has indicated his/her understanding and acceptance.   Dental advisory given  Plan Discussed with: CRNA and Surgeon  Anesthesia Plan Comments:       Anesthesia Quick Evaluation
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
## 2016-05-30 ENCOUNTER — Encounter (HOSPITAL_COMMUNITY)
Admission: EM | Disposition: A | Discharge status: Skilled Nursing Facility | Payer: Self-pay | Source: Home / Self Care | Attending: Thoracic Surgery (Cardiothoracic Vascular Surgery)

## 2016-05-30 ENCOUNTER — Encounter (HOSPITAL_COMMUNITY): Payer: Self-pay | Admitting: Certified Registered Nurse Anesthetist

## 2016-05-30 ENCOUNTER — Inpatient Hospital Stay (HOSPITAL_COMMUNITY): Payer: Medicare HMO | Admitting: Certified Registered Nurse Anesthetist

## 2016-05-30 ENCOUNTER — Inpatient Hospital Stay (HOSPITAL_COMMUNITY): Payer: Medicare HMO

## 2016-05-30 ENCOUNTER — Inpatient Hospital Stay (HOSPITAL_COMMUNITY)
Admission: EM | Disposition: A | Discharge status: Skilled Nursing Facility | Payer: Self-pay | Source: Home / Self Care | Attending: Thoracic Surgery (Cardiothoracic Vascular Surgery)

## 2016-05-30 DIAGNOSIS — I2510 Atherosclerotic heart disease of native coronary artery without angina pectoris: Secondary | ICD-10-CM | POA: Diagnosis present

## 2016-05-30 HISTORY — PX: CORONARY ARTERY BYPASS GRAFT: SHX141

## 2016-05-30 LAB — POCT I-STAT 3, ART BLOOD GAS (G3+)
Acid-Base Excess: 6 mmol/L — ABNORMAL HIGH (ref 0.0–2.0)
Acid-Base Excess: 3 mmol/L — ABNORMAL HIGH (ref 0.0–2.0)
Acid-Base Excess: 1 mmol/L (ref 0.0–2.0)
Acid-Base Excess: 1 mmol/L (ref 0.0–2.0)
Acid-base deficit: 3 mmol/L — ABNORMAL HIGH (ref 0.0–2.0)
Acid-base deficit: 4 mmol/L — ABNORMAL HIGH (ref 0.0–2.0)
Bicarbonate: 29.8 mmol/L — ABNORMAL HIGH (ref 20.0–28.0)
Bicarbonate: 26.5 mmol/L (ref 20.0–28.0)
Bicarbonate: 24.6 mmol/L (ref 20.0–28.0)
Bicarbonate: 26.4 mmol/L (ref 20.0–28.0)
Bicarbonate: 22.7 mmol/L (ref 20.0–28.0)
Bicarbonate: 22 mmol/L (ref 20.0–28.0)
O2 Saturation: 100 %
O2 Saturation: 100 %
O2 Saturation: 99 %
O2 Saturation: 99 %
O2 Saturation: 95 %
O2 Saturation: 96 %
Patient temperature: 37.6
Patient temperature: 37.3
Patient temperature: 36.7
TCO2: 31 mmol/L (ref 0–100)
TCO2: 27 mmol/L (ref 0–100)
TCO2: 26 mmol/L (ref 0–100)
TCO2: 28 mmol/L (ref 0–100)
TCO2: 24 mmol/L (ref 0–100)
TCO2: 23 mmol/L (ref 0–100)
pCO2 arterial: 41.1 mmHg (ref 32.0–48.0)
pCO2 arterial: 33.2 mmHg (ref 32.0–48.0)
pCO2 arterial: 33.4 mmHg (ref 32.0–48.0)
pCO2 arterial: 44.5 mmHg (ref 32.0–48.0)
pCO2 arterial: 43.5 mmHg (ref 32.0–48.0)
pCO2 arterial: 44.4 mmHg (ref 32.0–48.0)
pH, Arterial: 7.468 — ABNORMAL HIGH (ref 7.350–7.450)
pH, Arterial: 7.51 — ABNORMAL HIGH (ref 7.350–7.450)
pH, Arterial: 7.476 — ABNORMAL HIGH (ref 7.350–7.450)
pH, Arterial: 7.383 (ref 7.350–7.450)
pH, Arterial: 7.326 — ABNORMAL LOW (ref 7.350–7.450)
pH, Arterial: 7.301 — ABNORMAL LOW (ref 7.350–7.450)
pO2, Arterial: 388 mmHg — ABNORMAL HIGH (ref 83.0–108.0)
pO2, Arterial: 511 mmHg — ABNORMAL HIGH (ref 83.0–108.0)
pO2, Arterial: 112 mmHg — ABNORMAL HIGH (ref 83.0–108.0)
pO2, Arterial: 140 mmHg — ABNORMAL HIGH (ref 83.0–108.0)
pO2, Arterial: 82 mmHg — ABNORMAL LOW (ref 83.0–108.0)
pO2, Arterial: 92 mmHg (ref 83.0–108.0)

## 2016-05-30 LAB — BASIC METABOLIC PANEL
Anion gap: 8 (ref 5–15)
BUN: 8 mg/dL (ref 6–20)
CO2: 25 mmol/L (ref 22–32)
Calcium: 8.5 mg/dL — ABNORMAL LOW (ref 8.9–10.3)
Chloride: 101 mmol/L (ref 101–111)
Creatinine, Ser: 0.7 mg/dL (ref 0.44–1.00)
GFR calc Af Amer: >60 mL/min (ref >60)
GFR calc non Af Amer: >60 mL/min (ref >60)
Glucose, Bld: 101 mg/dL — ABNORMAL HIGH (ref 65–99)
Potassium: 4 mmol/L (ref 3.5–5.1)
Sodium: 134 mmol/L — ABNORMAL LOW (ref 135–145)

## 2016-05-30 LAB — URINALYSIS, ROUTINE W REFLEX MICROSCOPIC
Bilirubin Urine: NEGATIVE
Glucose, UA: NEGATIVE mg/dL
Hgb urine dipstick: NEGATIVE
Ketones, ur: NEGATIVE mg/dL
Leukocytes, UA: NEGATIVE
Nitrite: NEGATIVE
Protein, ur: NEGATIVE mg/dL
Specific Gravity, Urine: 1.02 (ref 1.005–1.030)
pH: 6 (ref 5.0–8.0)

## 2016-05-30 LAB — CBC
HCT: 24.4 % — ABNORMAL LOW (ref 36.0–46.0)
HCT: 30.9 % — ABNORMAL LOW (ref 36.0–46.0)
HCT: 31.7 % — ABNORMAL LOW (ref 36.0–46.0)
Hemoglobin: 7.5 g/dL — ABNORMAL LOW (ref 12.0–15.0)
Hemoglobin: 10.4 g/dL — ABNORMAL LOW (ref 12.0–15.0)
Hemoglobin: 10.4 g/dL — ABNORMAL LOW (ref 12.0–15.0)
MCH: 26 pg (ref 26.0–34.0)
MCH: 28.2 pg (ref 26.0–34.0)
MCH: 27.4 pg (ref 26.0–34.0)
MCHC: 30.7 g/dL (ref 30.0–36.0)
MCHC: 33.7 g/dL (ref 30.0–36.0)
MCHC: 32.8 g/dL (ref 30.0–36.0)
MCV: 84.4 fL (ref 78.0–100.0)
MCV: 83.7 fL (ref 78.0–100.0)
MCV: 83.4 fL (ref 78.0–100.0)
Platelets: 149 10*3/uL — ABNORMAL LOW (ref 150–400)
Platelets: 79 10*3/uL — ABNORMAL LOW (ref 150–400)
Platelets: 81 10*3/uL — ABNORMAL LOW (ref 150–400)
RBC: 2.89 MIL/uL — ABNORMAL LOW (ref 3.87–5.11)
RBC: 3.69 MIL/uL — ABNORMAL LOW (ref 3.87–5.11)
RBC: 3.8 MIL/uL — ABNORMAL LOW (ref 3.87–5.11)
RDW: 16.4 % — ABNORMAL HIGH (ref 11.5–15.5)
RDW: 15.4 % (ref 11.5–15.5)
RDW: 15.6 % — ABNORMAL HIGH (ref 11.5–15.5)
WBC: 6 10*3/uL (ref 4.0–10.5)
WBC: 11.7 10*3/uL — ABNORMAL HIGH (ref 4.0–10.5)
WBC: 15.3 10*3/uL — ABNORMAL HIGH (ref 4.0–10.5)

## 2016-05-30 LAB — GLUCOSE, CAPILLARY
Glucose-Capillary: 84 mg/dL (ref 65–99)
Glucose-Capillary: 113 mg/dL — ABNORMAL HIGH (ref 65–99)
Glucose-Capillary: 121 mg/dL — ABNORMAL HIGH (ref 65–99)
Glucose-Capillary: 131 mg/dL — ABNORMAL HIGH (ref 65–99)
Glucose-Capillary: 137 mg/dL — ABNORMAL HIGH (ref 65–99)
Glucose-Capillary: 143 mg/dL — ABNORMAL HIGH (ref 65–99)
Glucose-Capillary: 133 mg/dL — ABNORMAL HIGH (ref 65–99)

## 2016-05-30 LAB — APTT
aPTT: 72 seconds — ABNORMAL HIGH (ref 24–36)
aPTT: 30 seconds (ref 24–36)

## 2016-05-30 LAB — POCT I-STAT, CHEM 8
BUN: 9 mg/dL (ref 6–20)
BUN: 7 mg/dL (ref 6–20)
BUN: 9 mg/dL (ref 6–20)
BUN: 7 mg/dL (ref 6–20)
BUN: 8 mg/dL (ref 6–20)
BUN: 8 mg/dL (ref 6–20)
BUN: 12 mg/dL (ref 6–20)
Calcium, Ion: 1.25 mmol/L (ref 1.15–1.40)
Calcium, Ion: 0.98 mmol/L — ABNORMAL LOW (ref 1.15–1.40)
Calcium, Ion: 1.2 mmol/L (ref 1.15–1.40)
Calcium, Ion: 1.1 mmol/L — ABNORMAL LOW (ref 1.15–1.40)
Calcium, Ion: 1.08 mmol/L — ABNORMAL LOW (ref 1.15–1.40)
Calcium, Ion: 1.1 mmol/L — ABNORMAL LOW (ref 1.15–1.40)
Calcium, Ion: 1.18 mmol/L (ref 1.15–1.40)
Chloride: 98 mmol/L — ABNORMAL LOW (ref 101–111)
Chloride: 98 mmol/L — ABNORMAL LOW (ref 101–111)
Chloride: 99 mmol/L — ABNORMAL LOW (ref 101–111)
Chloride: 98 mmol/L — ABNORMAL LOW (ref 101–111)
Chloride: 98 mmol/L — ABNORMAL LOW (ref 101–111)
Chloride: 99 mmol/L — ABNORMAL LOW (ref 101–111)
Chloride: 101 mmol/L (ref 101–111)
Creatinine, Ser: 0.5 mg/dL (ref 0.44–1.00)
Creatinine, Ser: 0.4 mg/dL — ABNORMAL LOW (ref 0.44–1.00)
Creatinine, Ser: 0.5 mg/dL (ref 0.44–1.00)
Creatinine, Ser: 0.4 mg/dL — ABNORMAL LOW (ref 0.44–1.00)
Creatinine, Ser: 0.4 mg/dL — ABNORMAL LOW (ref 0.44–1.00)
Creatinine, Ser: 0.6 mg/dL (ref 0.44–1.00)
Creatinine, Ser: 0.7 mg/dL (ref 0.44–1.00)
Glucose, Bld: 99 mg/dL (ref 65–99)
Glucose, Bld: 108 mg/dL — ABNORMAL HIGH (ref 65–99)
Glucose, Bld: 125 mg/dL — ABNORMAL HIGH (ref 65–99)
Glucose, Bld: 122 mg/dL — ABNORMAL HIGH (ref 65–99)
Glucose, Bld: 155 mg/dL — ABNORMAL HIGH (ref 65–99)
Glucose, Bld: 155 mg/dL — ABNORMAL HIGH (ref 65–99)
Glucose, Bld: 152 mg/dL — ABNORMAL HIGH (ref 65–99)
HCT: 20 % — ABNORMAL LOW (ref 36.0–46.0)
HCT: 17 % — ABNORMAL LOW (ref 36.0–46.0)
HCT: 20 % — ABNORMAL LOW (ref 36.0–46.0)
HCT: 18 % — ABNORMAL LOW (ref 36.0–46.0)
HCT: 20 % — ABNORMAL LOW (ref 36.0–46.0)
HCT: 21 % — ABNORMAL LOW (ref 36.0–46.0)
HCT: 30 % — ABNORMAL LOW (ref 36.0–46.0)
Hemoglobin: 6.8 g/dL — CL (ref 12.0–15.0)
Hemoglobin: 5.8 g/dL — CL (ref 12.0–15.0)
Hemoglobin: 6.8 g/dL — CL (ref 12.0–15.0)
Hemoglobin: 6.1 g/dL — CL (ref 12.0–15.0)
Hemoglobin: 6.8 g/dL — CL (ref 12.0–15.0)
Hemoglobin: 7.1 g/dL — ABNORMAL LOW (ref 12.0–15.0)
Hemoglobin: 10.2 g/dL — ABNORMAL LOW (ref 12.0–15.0)
Potassium: 4 mmol/L (ref 3.5–5.1)
Potassium: 4.4 mmol/L (ref 3.5–5.1)
Potassium: 4.7 mmol/L (ref 3.5–5.1)
Potassium: 5.4 mmol/L — ABNORMAL HIGH (ref 3.5–5.1)
Potassium: 4.9 mmol/L (ref 3.5–5.1)
Potassium: 5 mmol/L (ref 3.5–5.1)
Potassium: 5.1 mmol/L (ref 3.5–5.1)
Sodium: 134 mmol/L — ABNORMAL LOW (ref 135–145)
Sodium: 133 mmol/L — ABNORMAL LOW (ref 135–145)
Sodium: 134 mmol/L — ABNORMAL LOW (ref 135–145)
Sodium: 131 mmol/L — ABNORMAL LOW (ref 135–145)
Sodium: 132 mmol/L — ABNORMAL LOW (ref 135–145)
Sodium: 135 mmol/L (ref 135–145)
Sodium: 136 mmol/L (ref 135–145)
TCO2: 30 mmol/L (ref 0–100)
TCO2: 28 mmol/L (ref 0–100)
TCO2: 28 mmol/L (ref 0–100)
TCO2: 28 mmol/L (ref 0–100)
TCO2: 30 mmol/L (ref 0–100)
TCO2: 25 mmol/L (ref 0–100)
TCO2: 27 mmol/L (ref 0–100)

## 2016-05-30 LAB — POCT I-STAT 4, (NA,K, GLUC, HGB,HCT)
Glucose, Bld: 115 mg/dL — ABNORMAL HIGH (ref 65–99)
HCT: 26 % — ABNORMAL LOW (ref 36.0–46.0)
Hemoglobin: 8.8 g/dL — ABNORMAL LOW (ref 12.0–15.0)
Potassium: 4.8 mmol/L (ref 3.5–5.1)
Sodium: 137 mmol/L (ref 135–145)

## 2016-05-30 LAB — BLOOD GAS, ARTERIAL
Acid-Base Excess: 0.1 mmol/L (ref 0.0–2.0)
Bicarbonate: 23.9 mmol/L (ref 20.0–28.0)
Drawn by: 365271
FIO2: 21
O2 Saturation: 94.1 %
Patient temperature: 98.6
pCO2 arterial: 36.5 mmHg (ref 32.0–48.0)
pH, Arterial: 7.431 (ref 7.350–7.450)
pO2, Arterial: 71.1 mmHg — ABNORMAL LOW (ref 83.0–108.0)

## 2016-05-30 LAB — PLATELET COUNT: Platelets: 91 10*3/uL — ABNORMAL LOW (ref 150–400)

## 2016-05-30 LAB — PROTIME-INR
INR: 1.07
INR: 1.28
Prothrombin Time: 14 seconds (ref 11.4–15.2)
Prothrombin Time: 16.1 seconds — ABNORMAL HIGH (ref 11.4–15.2)

## 2016-05-30 LAB — CREATININE, SERUM
Creatinine, Ser: 0.77 mg/dL (ref 0.44–1.00)
GFR calc Af Amer: >60 mL/min (ref >60)
GFR calc non Af Amer: >60 mL/min (ref >60)

## 2016-05-30 LAB — SURGICAL PCR SCREEN
MRSA, PCR: NEGATIVE
Staphylococcus aureus: NEGATIVE

## 2016-05-30 LAB — PREPARE RBC (CROSSMATCH)

## 2016-05-30 LAB — MAGNESIUM: Magnesium: 2.8 mg/dL — ABNORMAL HIGH (ref 1.7–2.4)

## 2016-05-30 LAB — HEPARIN LEVEL (UNFRACTIONATED): Heparin Unfractionated: 0.26 IU/mL — ABNORMAL LOW (ref 0.30–0.70)

## 2016-05-30 LAB — HEMOGLOBIN AND HEMATOCRIT, BLOOD
HCT: 21.5 % — ABNORMAL LOW (ref 36.0–46.0)
Hemoglobin: 6.9 g/dL — CL (ref 12.0–15.0)

## 2016-05-30 SURGERY — POSTERIOR LUMBAR FUSION 2 WITH HARDWARE REMOVAL
Anesthesia: General | Site: Back

## 2016-05-30 SURGERY — CORONARY ARTERY BYPASS GRAFTING (CABG)
Anesthesia: General | Site: Chest

## 2016-05-30 MED ORDER — PROPOFOL 10 MG/ML IV BOLUS
INTRAVENOUS | Status: DC | PRN
  Administered 2016-05-30 (×2): 20 mg via INTRAVENOUS
  Administered 2016-05-30: 80 mg via INTRAVENOUS

## 2016-05-30 MED ORDER — SODIUM CHLORIDE 0.9 % IV SOLN
INTRAVENOUS | Status: DC | PRN
  Administered 2016-05-30: 14:00:00 via INTRAVENOUS

## 2016-05-30 MED ORDER — LACTATED RINGERS IV SOLN
INTRAVENOUS | Status: DC | PRN
  Administered 2016-05-30 (×2): via INTRAVENOUS

## 2016-05-30 MED ORDER — LIDOCAINE 2% (20 MG/ML) 5 ML SYRINGE
INTRAMUSCULAR | Status: DC | PRN
Start: 2016-05-30 — End: 2016-05-30
  Administered 2016-05-30: 100 mg via INTRAVENOUS

## 2016-05-30 MED ORDER — ALBUMIN HUMAN 5 % IV SOLN
12.5000 g | Freq: Once | INTRAVENOUS | Status: AC
  Administered 2016-05-30: 12.5 g via INTRAVENOUS

## 2016-05-30 MED ORDER — MIDAZOLAM HCL 10 MG/2ML IJ SOLN
INTRAMUSCULAR | Status: AC
  Filled 2016-05-30: qty 2

## 2016-05-30 MED ORDER — METOPROLOL TARTRATE 12.5 MG HALF TABLET
12.5000 mg | ORAL_TABLET | Freq: Two times a day (BID) | ORAL | Status: DC
  Administered 2016-05-31 – 2016-06-01 (×4): 12.5 mg via ORAL
  Filled 2016-05-30 (×5): qty 1

## 2016-05-30 MED ORDER — SODIUM CHLORIDE 0.9% FLUSH
3.0000 mL | Freq: Two times a day (BID) | INTRAVENOUS | Status: DC
Start: 2016-05-31 — End: 2016-06-03
  Administered 2016-05-31 – 2016-06-03 (×4): 3 mL via INTRAVENOUS

## 2016-05-30 MED ORDER — PHENYLEPHRINE 40 MCG/ML (10ML) SYRINGE FOR IV PUSH (FOR BLOOD PRESSURE SUPPORT)
PREFILLED_SYRINGE | INTRAVENOUS | Status: AC
  Filled 2016-05-30: qty 10

## 2016-05-30 MED ORDER — HEPARIN SODIUM (PORCINE) 1000 UNIT/ML IJ SOLN
INTRAMUSCULAR | Status: AC
  Filled 2016-05-30: qty 1

## 2016-05-30 MED ORDER — LACTATED RINGERS IV SOLN
INTRAVENOUS | Status: DC
  Administered 2016-05-30: 15:00:00 via INTRAVENOUS

## 2016-05-30 MED ORDER — ARTIFICIAL TEARS OP OINT
TOPICAL_OINTMENT | OPHTHALMIC | Status: AC
  Filled 2016-05-30: qty 3.5

## 2016-05-30 MED ORDER — LIDOCAINE 2% (20 MG/ML) 5 ML SYRINGE
INTRAMUSCULAR | Status: AC
  Filled 2016-05-30: qty 5

## 2016-05-30 MED ORDER — NITROGLYCERIN IN D5W 200-5 MCG/ML-% IV SOLN
0.0000 ug/min | INTRAVENOUS | Status: DC
  Administered 2016-05-30: 20 ug/min via INTRAVENOUS
  Administered 2016-05-31: 100 ug/min via INTRAVENOUS
  Filled 2016-05-30: qty 250

## 2016-05-30 MED ORDER — FENTANYL CITRATE (PF) 250 MCG/5ML IJ SOLN
INTRAMUSCULAR | Status: AC
  Filled 2016-05-30: qty 25

## 2016-05-30 MED ORDER — LACTATED RINGERS IV SOLN
INTRAVENOUS | Status: DC | PRN
  Administered 2016-05-30 (×2): via INTRAVENOUS

## 2016-05-30 MED ORDER — LACTATED RINGERS IV SOLN
INTRAVENOUS | Status: DC | PRN
  Administered 2016-05-30: 08:00:00 via INTRAVENOUS

## 2016-05-30 MED ORDER — MORPHINE SULFATE (PF) 4 MG/ML IV SOLN
2.0000 mg | INTRAVENOUS | Status: DC | PRN
  Administered 2016-05-30 (×2): 2 mg via INTRAVENOUS
  Administered 2016-05-30 – 2016-05-31 (×3): 4 mg via INTRAVENOUS
  Administered 2016-05-31 – 2016-06-01 (×3): 2 mg via INTRAVENOUS
  Filled 2016-05-30 (×7): qty 1

## 2016-05-30 MED ORDER — ROCURONIUM BROMIDE 50 MG/5ML IV SOSY
PREFILLED_SYRINGE | INTRAVENOUS | Status: AC
  Filled 2016-05-30: qty 5

## 2016-05-30 MED ORDER — ACETAMINOPHEN 160 MG/5ML PO SOLN: 650.0000 mg | Freq: Once | ORAL | Status: AC

## 2016-05-30 MED ORDER — LACTATED RINGERS IV SOLN: 500.0000 mL | Freq: Once | INTRAVENOUS | Status: DC | PRN

## 2016-05-30 MED ORDER — FENTANYL CITRATE (PF) 250 MCG/5ML IJ SOLN
INTRAMUSCULAR | Status: DC | PRN
  Administered 2016-05-30 (×2): 100 ug via INTRAVENOUS
  Administered 2016-05-30: 150 ug via INTRAVENOUS
  Administered 2016-05-30: 50 ug via INTRAVENOUS
  Administered 2016-05-30 (×2): 100 ug via INTRAVENOUS
  Administered 2016-05-30 (×2): 150 ug via INTRAVENOUS
  Administered 2016-05-30: 250 ug via INTRAVENOUS
  Administered 2016-05-30: 150 ug via INTRAVENOUS
  Administered 2016-05-30: 50 ug via INTRAVENOUS
  Administered 2016-05-30: 150 ug via INTRAVENOUS

## 2016-05-30 MED ORDER — DOCUSATE SODIUM 100 MG PO CAPS
200.0000 mg | ORAL_CAPSULE | Freq: Every day | ORAL | Status: DC
  Administered 2016-05-31 – 2016-06-06 (×7): 200 mg via ORAL
  Filled 2016-05-30 (×8): qty 2

## 2016-05-30 MED ORDER — EPHEDRINE 5 MG/ML INJ
INTRAVENOUS | Status: AC
  Filled 2016-05-30: qty 10

## 2016-05-30 MED ORDER — MIDAZOLAM HCL 2 MG/2ML IJ SOLN: 2.0000 mg | INTRAMUSCULAR | Status: DC | PRN

## 2016-05-30 MED ORDER — PHENYLEPHRINE 40 MCG/ML (10ML) SYRINGE FOR IV PUSH (FOR BLOOD PRESSURE SUPPORT)
PREFILLED_SYRINGE | INTRAVENOUS | Status: DC | PRN
  Administered 2016-05-30: 40 ug via INTRAVENOUS
  Administered 2016-05-30: 80 ug via INTRAVENOUS
  Administered 2016-05-30: 40 ug via INTRAVENOUS

## 2016-05-30 MED ORDER — BISACODYL 10 MG RE SUPP: 10.0000 mg | Freq: Every day | RECTAL | Status: DC

## 2016-05-30 MED ORDER — ALBUMIN HUMAN 5 % IV SOLN
250.0000 mL | INTRAVENOUS | Status: AC | PRN
  Administered 2016-05-30: 250 mL via INTRAVENOUS

## 2016-05-30 MED ORDER — PROPOFOL 10 MG/ML IV BOLUS
INTRAVENOUS | Status: AC
  Filled 2016-05-30: qty 20

## 2016-05-30 MED ORDER — METOPROLOL TARTRATE 5 MG/5ML IV SOLN
2.5000 mg | INTRAVENOUS | Status: DC | PRN
  Administered 2016-05-30 (×2): 2.5 mg via INTRAVENOUS
  Administered 2016-06-02: 5 mg via INTRAVENOUS
  Filled 2016-05-30: qty 5

## 2016-05-30 MED ORDER — VANCOMYCIN HCL IN DEXTROSE 1-5 GM/200ML-% IV SOLN
1000.0000 mg | Freq: Once | INTRAVENOUS | Status: AC
  Administered 2016-05-30: 1000 mg via INTRAVENOUS
  Filled 2016-05-30: qty 200

## 2016-05-30 MED ORDER — ALBUMIN HUMAN 5 % IV SOLN
INTRAVENOUS | Status: DC | PRN
  Administered 2016-05-30: 14:00:00 via INTRAVENOUS

## 2016-05-30 MED ORDER — SODIUM CHLORIDE 0.9% FLUSH: 3.0000 mL | INTRAVENOUS | Status: DC | PRN

## 2016-05-30 MED ORDER — SODIUM CHLORIDE 0.9 % IV SOLN: 250.0000 mL | INTRAVENOUS | Status: DC

## 2016-05-30 MED ORDER — MAGNESIUM SULFATE 4 GM/100ML IV SOLN
4.0000 g | Freq: Once | INTRAVENOUS | Status: AC
  Administered 2016-05-30: 4 g via INTRAVENOUS
  Filled 2016-05-30: qty 100

## 2016-05-30 MED ORDER — MIDAZOLAM HCL 5 MG/5ML IJ SOLN
INTRAMUSCULAR | Status: DC | PRN
Start: 2016-05-30 — End: 2016-05-30
  Administered 2016-05-30: 1 mg via INTRAVENOUS
  Administered 2016-05-30: 6 mg via INTRAVENOUS
  Administered 2016-05-30: 3 mg via INTRAVENOUS

## 2016-05-30 MED ORDER — CHLORHEXIDINE GLUCONATE 0.12 % MT SOLN
15.0000 mL | OROMUCOSAL | Status: AC
  Administered 2016-05-30: 15 mL via OROMUCOSAL

## 2016-05-30 MED ORDER — PHENYLEPHRINE HCL 10 MG/ML IJ SOLN
INTRAVENOUS | Status: DC | PRN
  Administered 2016-05-30: 20 ug/min via INTRAVENOUS

## 2016-05-30 MED ORDER — SODIUM CHLORIDE 0.9 % IV SOLN
0.0000 ug/min | INTRAVENOUS | Status: DC
  Administered 2016-05-30: 10 ug/min via INTRAVENOUS
  Filled 2016-05-30: qty 2

## 2016-05-30 MED ORDER — KETOROLAC TROMETHAMINE 15 MG/ML IJ SOLN
15.0000 mg | Freq: Four times a day (QID) | INTRAMUSCULAR | Status: AC | PRN
  Administered 2016-05-30 – 2016-05-31 (×4): 15 mg via INTRAVENOUS
  Filled 2016-05-30 (×3): qty 1

## 2016-05-30 MED ORDER — ASPIRIN 81 MG PO CHEW: 324.0000 mg | CHEWABLE_TABLET | Freq: Every day | ORAL | Status: DC

## 2016-05-30 MED ORDER — DEXTROSE 5 % IV SOLN
1.5000 g | Freq: Two times a day (BID) | INTRAVENOUS | Status: AC
  Administered 2016-05-31 – 2016-06-01 (×4): 1.5 g via INTRAVENOUS
  Filled 2016-05-30 (×4): qty 1.5

## 2016-05-30 MED ORDER — SODIUM CHLORIDE 0.9 % IV SOLN
30.0000 meq | Freq: Once | INTRAVENOUS | Status: DC
  Filled 2016-05-30: qty 15

## 2016-05-30 MED ORDER — ACETAMINOPHEN 650 MG RE SUPP
650.0000 mg | Freq: Once | RECTAL | Status: AC
  Administered 2016-05-30: 650 mg via RECTAL

## 2016-05-30 MED ORDER — BISACODYL 5 MG PO TBEC
10.0000 mg | DELAYED_RELEASE_TABLET | Freq: Every day | ORAL | Status: DC
  Administered 2016-05-31 – 2016-06-06 (×7): 10 mg via ORAL
  Filled 2016-05-30 (×8): qty 2

## 2016-05-30 MED ORDER — SODIUM CHLORIDE 0.9 % IJ SOLN
OROMUCOSAL | Status: DC | PRN
  Administered 2016-05-30 (×3): 4 mL via TOPICAL

## 2016-05-30 MED ORDER — ORAL CARE MOUTH RINSE
15.0000 mL | Freq: Four times a day (QID) | OROMUCOSAL | Status: DC
  Administered 2016-05-31 (×2): 15 mL via OROMUCOSAL

## 2016-05-30 MED ORDER — ASPIRIN EC 325 MG PO TBEC
325.0000 mg | DELAYED_RELEASE_TABLET | Freq: Every day | ORAL | Status: DC
  Filled 2016-05-30: qty 1

## 2016-05-30 MED ORDER — ONDANSETRON HCL 4 MG/2ML IJ SOLN
4.0000 mg | Freq: Four times a day (QID) | INTRAMUSCULAR | Status: DC | PRN
  Administered 2016-05-31 – 2016-06-01 (×4): 4 mg via INTRAVENOUS
  Filled 2016-05-30 (×4): qty 2

## 2016-05-30 MED ORDER — CHLORHEXIDINE GLUCONATE 0.12% ORAL RINSE (MEDLINE KIT)
15.0000 mL | Freq: Two times a day (BID) | OROMUCOSAL | Status: DC
  Administered 2016-05-30 – 2016-05-31 (×2): 15 mL via OROMUCOSAL

## 2016-05-30 MED ORDER — PANTOPRAZOLE SODIUM 40 MG PO TBEC
40.0000 mg | DELAYED_RELEASE_TABLET | Freq: Every day | ORAL | Status: DC
  Administered 2016-06-01 – 2016-06-07 (×7): 40 mg via ORAL
  Filled 2016-05-30 (×7): qty 1

## 2016-05-30 MED ORDER — INSULIN REGULAR BOLUS VIA INFUSION
0.0000 [IU] | Freq: Three times a day (TID) | INTRAVENOUS | Status: DC
  Filled 2016-05-30: qty 10

## 2016-05-30 MED ORDER — SODIUM CHLORIDE 0.9 % IV SOLN
INTRAVENOUS | Status: DC
  Administered 2016-05-30: 15:00:00 via INTRAVENOUS

## 2016-05-30 MED ORDER — HEMOSTATIC AGENTS (NO CHARGE) OPTIME
TOPICAL | Status: DC | PRN
  Administered 2016-05-30: 1 via TOPICAL

## 2016-05-30 MED ORDER — ROCURONIUM BROMIDE 10 MG/ML (PF) SYRINGE
PREFILLED_SYRINGE | INTRAVENOUS | Status: DC | PRN
  Administered 2016-05-30 (×4): 50 mg via INTRAVENOUS

## 2016-05-30 MED ORDER — ACETAMINOPHEN 500 MG PO TABS
1000.0000 mg | ORAL_TABLET | Freq: Four times a day (QID) | ORAL | Status: AC
  Administered 2016-05-31 – 2016-06-04 (×18): 1000 mg via ORAL
  Filled 2016-05-30 (×19): qty 2

## 2016-05-30 MED ORDER — SODIUM CHLORIDE 0.9 % IV SOLN: Freq: Once | INTRAVENOUS | Status: DC

## 2016-05-30 MED ORDER — SODIUM CHLORIDE 0.9 % IV SOLN
INTRAVENOUS | Status: DC
  Administered 2016-05-30: 1.7 [IU]/h via INTRAVENOUS
  Administered 2016-05-30: 0.8 [IU]/h via INTRAVENOUS
  Filled 2016-05-30 (×2): qty 2.5

## 2016-05-30 MED ORDER — ARTIFICIAL TEARS OP OINT
TOPICAL_OINTMENT | OPHTHALMIC | Status: DC | PRN
  Administered 2016-05-30: 1 via OPHTHALMIC

## 2016-05-30 MED ORDER — PROTAMINE SULFATE 10 MG/ML IV SOLN
INTRAVENOUS | Status: DC | PRN
  Administered 2016-05-30: 220 mg via INTRAVENOUS

## 2016-05-30 MED ORDER — ACETAMINOPHEN 160 MG/5ML PO SOLN: 1000.0000 mg | Freq: Four times a day (QID) | ORAL | Status: DC

## 2016-05-30 MED ORDER — METOPROLOL TARTRATE 25 MG/10 ML ORAL SUSPENSION
12.5000 mg | Freq: Two times a day (BID) | ORAL | Status: DC
  Administered 2016-05-30: 12.5 mg
  Filled 2016-05-30: qty 5

## 2016-05-30 MED ORDER — PROTAMINE SULFATE 10 MG/ML IV SOLN
INTRAVENOUS | Status: AC
  Filled 2016-05-30: qty 25

## 2016-05-30 MED ORDER — TRAMADOL HCL 50 MG PO TABS
50.0000 mg | ORAL_TABLET | ORAL | Status: DC | PRN
  Administered 2016-06-01 – 2016-06-04 (×5): 50 mg via ORAL
  Filled 2016-05-30 (×5): qty 1

## 2016-05-30 MED ORDER — HEPARIN SODIUM (PORCINE) 1000 UNIT/ML IJ SOLN
INTRAMUSCULAR | Status: DC | PRN
  Administered 2016-05-30: 2000 [IU] via INTRAVENOUS
  Administered 2016-05-30: 22000 [IU] via INTRAVENOUS

## 2016-05-30 MED ORDER — 0.9 % SODIUM CHLORIDE (POUR BTL) OPTIME
TOPICAL | Status: DC | PRN
  Administered 2016-05-30: 6000 mL

## 2016-05-30 MED ORDER — MORPHINE SULFATE (PF) 4 MG/ML IV SOLN
1.0000 mg | INTRAVENOUS | Status: AC | PRN
  Administered 2016-05-30: 1 mg via INTRAVENOUS
  Filled 2016-05-30 (×2): qty 1

## 2016-05-30 MED ORDER — SODIUM CHLORIDE 0.9 % IV SOLN
0.0000 ug/kg/h | INTRAVENOUS | Status: DC
  Administered 2016-05-30: 0.1 ug/kg/h via INTRAVENOUS
  Administered 2016-05-30: 0.5 ug/kg/h via INTRAVENOUS
  Filled 2016-05-30: qty 2

## 2016-05-30 MED ORDER — KETOROLAC TROMETHAMINE 15 MG/ML IJ SOLN
INTRAMUSCULAR | Status: AC
  Filled 2016-05-30: qty 1

## 2016-05-30 MED ORDER — FENTANYL CITRATE (PF) 250 MCG/5ML IJ SOLN
INTRAMUSCULAR | Status: AC
  Filled 2016-05-30: qty 5

## 2016-05-30 MED ORDER — NITROGLYCERIN 0.2 MG/ML ON CALL CATH LAB
INTRAVENOUS | Status: DC | PRN
  Administered 2016-05-30: 20 ug via INTRAVENOUS
  Administered 2016-05-30: 40 ug via INTRAVENOUS
  Administered 2016-05-30: 20 ug via INTRAVENOUS

## 2016-05-30 MED ORDER — FAMOTIDINE IN NACL 20-0.9 MG/50ML-% IV SOLN
20.0000 mg | Freq: Two times a day (BID) | INTRAVENOUS | Status: AC
  Administered 2016-05-30: 20 mg via INTRAVENOUS

## 2016-05-30 MED ORDER — SODIUM CHLORIDE 0.45 % IV SOLN: INTRAVENOUS | Status: DC | PRN

## 2016-05-30 MED FILL — Heparin Sodium (Porcine) Inj 1000 Unit/ML: INTRAMUSCULAR | Qty: 30 | Status: AC

## 2016-05-30 MED FILL — Potassium Chloride Inj 2 mEq/ML: INTRAVENOUS | Qty: 40 | Status: AC

## 2016-05-30 MED FILL — Magnesium Sulfate Inj 50%: INTRAMUSCULAR | Qty: 10 | Status: AC

## 2016-05-30 SURGICAL SUPPLY — 95 items
ADH SKN CLS APL DERMABOND .7 (GAUZE/BANDAGES/DRESSINGS) ×1
ARTICLIP LAA PROCLIP II 40 (Clip) ×2 IMPLANT
ARTICLIP LAA PROCLIP II 40MM (Clip) ×1 IMPLANT
BAG DECANTER FOR FLEXI CONT (MISCELLANEOUS) ×3 IMPLANT
BANDAGE ACE 4X5 VEL STRL LF (GAUZE/BANDAGES/DRESSINGS) ×5 IMPLANT
BANDAGE ACE 6X5 VEL STRL LF (GAUZE/BANDAGES/DRESSINGS) ×5 IMPLANT
BASKET HEART  (ORDER IN 25'S) (MISCELLANEOUS) ×1
BASKET HEART (ORDER IN 25'S) (MISCELLANEOUS) ×1
BASKET HEART (ORDER IN 25S) (MISCELLANEOUS) ×1 IMPLANT
BLADE STERNUM SYSTEM 6 (BLADE) ×3 IMPLANT
BLADE SURG 11 STRL SS (BLADE) ×2 IMPLANT
BNDG GAUZE ELAST 4 BULKY (GAUZE/BANDAGES/DRESSINGS) ×5 IMPLANT
CANISTER SUCT 3000ML PPV (MISCELLANEOUS) ×3 IMPLANT
CANNULA EZ GLIDE AORTIC 21FR (CANNULA) ×3 IMPLANT
CANNULA VESSEL 3MM BLUNT TIP (CANNULA) ×2 IMPLANT
CATH CPB KIT HENDRICKSON (MISCELLANEOUS) ×3 IMPLANT
CATH ROBINSON RED A/P 18FR (CATHETERS) ×3 IMPLANT
CATH THORACIC 36FR (CATHETERS) ×3 IMPLANT
CATH THORACIC 36FR RT ANG (CATHETERS) ×3 IMPLANT
CLIP FOGARTY SPRING 6M (CLIP) ×2 IMPLANT
CLIP TI MEDIUM 24 (CLIP) IMPLANT
CLIP TI WIDE RED SMALL 24 (CLIP) ×4 IMPLANT
CRADLE DONUT ADULT HEAD (MISCELLANEOUS) ×3 IMPLANT
DERMABOND ADVANCED (GAUZE/BANDAGES/DRESSINGS) ×2
DERMABOND ADVANCED .7 DNX12 (GAUZE/BANDAGES/DRESSINGS) IMPLANT
DEVICE ATRICLIP LAA PRCLPII 40 (Clip) IMPLANT
DRAPE CARDIOVASCULAR INCISE (DRAPES) ×3
DRAPE SLUSH/WARMER DISC (DRAPES) ×3 IMPLANT
DRAPE SRG 135X102X78XABS (DRAPES) ×1 IMPLANT
DRSG COVADERM 4X14 (GAUZE/BANDAGES/DRESSINGS) ×3 IMPLANT
ELECT REM PT RETURN 9FT ADLT (ELECTROSURGICAL) ×6
ELECTRODE REM PT RTRN 9FT ADLT (ELECTROSURGICAL) ×2 IMPLANT
FELT TEFLON 1X6 (MISCELLANEOUS) ×4 IMPLANT
GAUZE SPONGE 4X4 12PLY STRL (GAUZE/BANDAGES/DRESSINGS) ×8 IMPLANT
GLOVE BIO SURGEON STRL SZ 6.5 (GLOVE) ×1 IMPLANT
GLOVE BIO SURGEONS STRL SZ 6.5 (GLOVE) ×1
GLOVE BIOGEL PI IND STRL 6.5 (GLOVE) IMPLANT
GLOVE BIOGEL PI INDICATOR 6.5 (GLOVE) ×10
GLOVE SURG SIGNA 7.5 PF LTX (GLOVE) ×9 IMPLANT
GOWN STRL REUS W/ TWL LRG LVL3 (GOWN DISPOSABLE) ×4 IMPLANT
GOWN STRL REUS W/ TWL XL LVL3 (GOWN DISPOSABLE) ×2 IMPLANT
GOWN STRL REUS W/TWL LRG LVL3 (GOWN DISPOSABLE) ×24
GOWN STRL REUS W/TWL XL LVL3 (GOWN DISPOSABLE) ×6
HEMOSTAT POWDER SURGIFOAM 1G (HEMOSTASIS) ×9 IMPLANT
HEMOSTAT SURGICEL 2X14 (HEMOSTASIS) ×3 IMPLANT
INSERT FOGARTY XLG (MISCELLANEOUS) IMPLANT
KIT BASIN OR (CUSTOM PROCEDURE TRAY) ×3 IMPLANT
KIT ROOM TURNOVER OR (KITS) ×3 IMPLANT
KIT SUCTION CATH 14FR (SUCTIONS) ×6 IMPLANT
KIT VASOVIEW HEMOPRO VH 3000 (KITS) ×3 IMPLANT
MARKER GRAFT CORONARY BYPASS (MISCELLANEOUS) ×11 IMPLANT
NS IRRIG 1000ML POUR BTL (IV SOLUTION) ×17 IMPLANT
PACK OPEN HEART (CUSTOM PROCEDURE TRAY) ×3 IMPLANT
PAD ARMBOARD 7.5X6 YLW CONV (MISCELLANEOUS) ×6 IMPLANT
PAD ELECT DEFIB RADIOL ZOLL (MISCELLANEOUS) ×3 IMPLANT
PENCIL BUTTON HOLSTER BLD 10FT (ELECTRODE) ×3 IMPLANT
PUNCH AORTIC ROTATE 4.0MM (MISCELLANEOUS) IMPLANT
PUNCH AORTIC ROTATE 4.5MM 8IN (MISCELLANEOUS) ×2 IMPLANT
PUNCH AORTIC ROTATE 5MM 8IN (MISCELLANEOUS) IMPLANT
SET CARDIOPLEGIA MPS 5001102 (MISCELLANEOUS) ×2 IMPLANT
SUT BONE WAX W31G (SUTURE) ×3 IMPLANT
SUT ETHILON 3 0 FSL (SUTURE) ×6 IMPLANT
SUT MNCRL AB 4-0 PS2 18 (SUTURE) ×6 IMPLANT
SUT PROLENE 3 0 SH DA (SUTURE) ×3 IMPLANT
SUT PROLENE 4 0 RB 1 (SUTURE)
SUT PROLENE 4 0 SH DA (SUTURE) IMPLANT
SUT PROLENE 4-0 RB1 .5 CRCL 36 (SUTURE) IMPLANT
SUT PROLENE 6 0 C 1 30 (SUTURE) ×12 IMPLANT
SUT PROLENE 7 0 BV1 MDA (SUTURE) ×7 IMPLANT
SUT PROLENE 8 0 BV175 6 (SUTURE) IMPLANT
SUT SILK  1 MH (SUTURE) ×4
SUT SILK 1 MH (SUTURE) IMPLANT
SUT STEEL 6MS V (SUTURE) ×3 IMPLANT
SUT STEEL STERNAL CCS#1 18IN (SUTURE) IMPLANT
SUT STEEL SZ 6 DBL 3X14 BALL (SUTURE) ×3 IMPLANT
SUT VIC AB 1 CTX 36 (SUTURE) ×6
SUT VIC AB 1 CTX36XBRD ANBCTR (SUTURE) ×2 IMPLANT
SUT VIC AB 2-0 CT1 27 (SUTURE) ×6
SUT VIC AB 2-0 CT1 TAPERPNT 27 (SUTURE) IMPLANT
SUT VIC AB 2-0 CTX 27 (SUTURE) IMPLANT
SUT VIC AB 3-0 SH 27 (SUTURE)
SUT VIC AB 3-0 SH 27X BRD (SUTURE) IMPLANT
SUT VIC AB 3-0 X1 27 (SUTURE) IMPLANT
SUT VICRYL 4-0 PS2 18IN ABS (SUTURE) IMPLANT
SUTURE E-PAK OPEN HEART (SUTURE) ×3 IMPLANT
SYSTEM SAHARA CHEST DRAIN ATS (WOUND CARE) ×3 IMPLANT
TOWEL GREEN STERILE (TOWEL DISPOSABLE) ×6 IMPLANT
TOWEL GREEN STERILE FF (TOWEL DISPOSABLE) ×6 IMPLANT
TOWEL OR 17X24 6PK STRL BLUE (TOWEL DISPOSABLE) ×4 IMPLANT
TOWEL OR 17X26 10 PK STRL BLUE (TOWEL DISPOSABLE) ×4 IMPLANT
TRAY FOLEY SILVER 16FR TEMP (SET/KITS/TRAYS/PACK) ×3 IMPLANT
TUBE FEEDING 8FR 16IN STR KANG (MISCELLANEOUS) ×3 IMPLANT
TUBING INSUFFLATION (TUBING) ×3 IMPLANT
UNDERPAD 30X30 (UNDERPADS AND DIAPERS) ×3 IMPLANT
WATER STERILE IRR 1000ML POUR (IV SOLUTION) ×6 IMPLANT

## 2016-05-30 NOTE — Anesthesia Procedure Notes (Addendum)
Central Venous Catheter Insertion Performed by: Lewie Loron, anesthesiologist Patient location: Pre-op. Preanesthetic checklist: patient identified, IV checked, site marked, risks and benefits discussed, surgical consent, monitors and equipment checked, pre-op evaluation, timeout performed and anesthesia consent Position: Trendelenburg Lidocaine 1% used for infiltration and patient sedated Hand hygiene performed , maximum sterile barriers used  and Seldinger technique used Catheter size: 9 Fr Total catheter length 10. Central line was placed.Sheath introducer Swan type:thermodilution PA Cath depth:45 Procedure performed using ultrasound guided technique. Ultrasound Notes:anatomy identified, needle tip was noted to be adjacent to the nerve/plexus identified, no ultrasound evidence of intravascular and/or intraneural injection and image(s) printed for medical record Attempts: 2 Following insertion, line sutured and dressing applied. Post procedure assessment: blood return through all ports, free fluid flow and no air  Patient tolerated the procedure well with no immediate complications.
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
## 2016-05-30 NOTE — Anesthesia Procedure Notes (Signed)
Procedure Name: Intubation Date/Time: 05/30/2016 8:50 AM Performed by: Rise Patience T Pre-anesthesia Checklist: Patient identified, Emergency Drugs available, Suction available and Patient being monitored Patient Re-evaluated:Patient Re-evaluated prior to inductionOxygen Delivery Method: Circle System Utilized Preoxygenation: Pre-oxygenation with 100% oxygen Intubation Type: IV induction Ventilation: Mask ventilation without difficulty and Oral airway inserted - appropriate to patient size Laryngoscope Size: Hyacinth Meeker and 2 Grade View: Grade I Tube type: Oral Tube size: 8.0 mm Number of attempts: 1 Airway Equipment and Method: Stylet and Oral airway Placement Confirmation: ETT inserted through vocal cords under direct vision,  positive ETCO2 and breath sounds checked- equal and bilateral Secured at: 22 cm Tube secured with: Tape Dental Injury: Teeth and Oropharynx as per pre-operative assessment

## 2016-05-30 NOTE — Anesthesia Postprocedure Evaluation (Signed)
Anesthesia Post Note  Patient: Erin [name]  Procedure(s) Performed: Procedure(s) (LRB): CORONARY ARTERY BYPASS GRAFTING (CABG), ON PUMP, TIMES FOUR, USING LEFT INTERNAL MAMMARY ARTERY AND ENDOSCOPICALLY HARVESTED BILATERAL GREATER SAPHENOUS VEINS WITH CLIPPING OF LEFT ATRIAL APPENDAGE (N/A)  Patient location during evaluation: SICU Anesthesia Type: General Level of consciousness: patient remains intubated per anesthesia plan Pain management: pain level controlled Vital Signs Assessment: post-procedure vital signs reviewed and stable Respiratory status: patient remains intubated per anesthesia plan Cardiovascular status: stable Anesthetic complications: no       Last Vitals:  Vitals:   05/30/16 1600 05/30/16 1700  BP:    Pulse: 80 80  Resp: 12 (!) 23  Temp: 37.7 C 37.6 C    Last Pain:  Vitals:   05/30/16 1500  TempSrc: Core (Comment)  PainSc:                  Erin [name]

## 2016-05-30 NOTE — Progress Notes (Signed)
  Echocardiogram Echocardiogram Transesophageal has been performed.  Erin [name] 05/30/2016, 9:44 AM

## 2016-05-30 NOTE — Brief Op Note (Addendum)
05/24/2016 - 05/30/2016  1:28 PM  PATIENT:  Erin [name]  70 y.o. female  PRE-OPERATIVE DIAGNOSIS: 1. ACS 2. CAD 3. Atrial fibrillation   POST-OPERATIVE DIAGNOSIS:   1. ACS 2. CAD 3. Atrial fibrillation    PROCEDURE:  MEDIAN STERNOTOMY for  CORONARY ARTERY BYPASS GRAFTING (CABG) x 4 (LIMA to LAD, SVG to RAMUS INTERMEDIATE, and SVG SEQUENTIALLY to CIRCUMFLEX and PDA)  USING LEFT INTERNAL MAMMARY ARTERY AND ENDOSCOPICALLY HARVESTED BILATERAL GREATER SAPHENOUS VEINS WITH CLIPPING OF LEFT ATRIAL APPENDAGE (size 40)  SURGEON:  Surgeon(s) and Role:    * Loreli Slot, MD - Primary  PHYSICIAN ASSISTANT: Doree Fudge PA-C  ANESTHESIA:   general  EBL:  Total I/O In: 1000 [I.V.:1000] Out: 830 [Urine:830]  BLOOD ADMINISTERED:Three CC PRBC  DRAINS: Chest tubes placed in the mediastinal and pleural spaces   COUNTS CORRECT:  YES  DICTATION: .Dragon Dictation  PLAN OF CARE: Admit to inpatient   PATIENT DISPOSITION:  ICU - intubated and hemodynamically stable.   Delay start of Pharmacological VTE agent (>24hrs) due to surgical blood loss or risk of bleeding: yes  BASELINE WEIGHT: 79 kg

## 2016-05-30 NOTE — OR Nursing (Signed)
Forty-five minute call to CVICU charge nurse at 1335. Spoke to [place].

## 2016-05-30 NOTE — Procedures (Signed)
Extubation Procedure Note  Patient Details:   Name: KIKUKO TRESNER DOB: 03/16/1945 MRN: [id number]   Airway Documentation:     Evaluation  O2 sats: stable throughout Complications: No apparent complications Patient did tolerate procedure well. Bilateral Breath Sounds: Clear, Diminished   Yes   Patient performed NIF -20 and FVC . Patient extubated to 4L [place] and was able to verbalize name.  Hiram Comber 05/30/2016, 8:46 PM

## 2016-05-30 NOTE — Interval H&P Note (Signed)
History and Physical Interval Note:  05/30/2016 7:54 AM  Erin [name]  has presented today for surgery, with the diagnosis of Three vessel disease  The various methods of treatment have been discussed with the patient and family. After consideration of risks, benefits and other options for treatment, the patient has consented to  Procedure(s): CORONARY ARTERY BYPASS GRAFTING (CABG) with left atrial clip (N/A) as a surgical intervention .  The patient's history has been reviewed, patient examined, no change in status, stable for surgery.  I have reviewed the patient's chart and labs.  Questions were answered to the patient's satisfaction.     Loreli Slot

## 2016-05-30 NOTE — Transfer of Care (Signed)
Immediate Anesthesia Transfer of Care Note  Patient: ADDINE MAURITZ  Procedure(s) Performed: Procedure(s) with comments: CORONARY ARTERY BYPASS GRAFTING (CABG), ON PUMP, TIMES FOUR, USING LEFT INTERNAL MAMMARY ARTERY AND ENDOSCOPICALLY HARVESTED BILATERAL GREATER SAPHENOUS VEINS WITH CLIPPING OF LEFT ATRIAL APPENDAGE (N/A) - LIMA to LAD, SVG to RAMUS INTERMEDIATE, and SVG SEQUENTIALLY to CIRCUMFLEX and PDA  Patient Location: SICU  Anesthesia Type:General  Level of Consciousness: sedated, unresponsive and Patient remains intubated per anesthesia plan  Airway & Oxygen Therapy: Patient remains intubated per anesthesia plan and Patient placed on Ventilator (see vital sign flow sheet for setting)  Post-op Assessment: Report given to RN and Post -op Vital signs reviewed and stable  Post vital signs: Reviewed and stable  Last Vitals:  Vitals:   05/30/16 0432 05/30/16 0437  BP:  130/81  Pulse: 81 72  Resp:  (!) 25  Temp:      Last Pain:  Vitals:   05/30/16 0011  TempSrc: Oral  PainSc:       Patients Stated Pain Goal: 2 (05/29/16 0428)  Complications: No apparent anesthesia complications

## 2016-05-30 NOTE — Progress Notes (Signed)
ANTICOAGULATION CONSULT NOTE - Follow Up Consult  Pharmacy Consult for heparin Indication: atrial fibrillation  Labs:  Recent Labs  05/28/16 0424 05/29/16 0509 05/30/16 0413  HGB 8.9* 8.4*  --   HCT 28.6* 27.3*  --   PLT 176 162  --   APTT  --   --  72*  LABPROT  --   --  14.0  INR  --   --  1.07  HEPARINUNFRC 0.34 0.29* 0.26*  CREATININE 0.84 0.74  --     Assessment/Plan:  71yo female slightly subtherapeutic on heparin after resumed though going to OR this am. Will continue gtt at current rate until off for OR and f/u after surgery.   Vernard Gambles, PharmD, BCPS  05/30/2016,5:17 AM

## 2016-05-30 NOTE — OR Nursing (Signed)
Twenty minute call to CVICU charge nurse at 1400.
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
## 2016-05-30 NOTE — Anesthesia Postprocedure Evaluation (Deleted)
Anesthesia Post Note  Patient: Erin [name]  Procedure(s) Performed: Procedure(s) (LRB): CORONARY ARTERY BYPASS GRAFTING (CABG), ON PUMP, TIMES FOUR, USING LEFT INTERNAL MAMMARY ARTERY AND ENDOSCOPICALLY HARVESTED BILATERAL GREATER SAPHENOUS VEINS WITH CLIPPING OF LEFT ATRIAL APPENDAGE (N/A)  Patient location during evaluation: PACU Anesthesia Type: General Level of consciousness: awake Pain management: pain level controlled Vital Signs Assessment: post-procedure vital signs reviewed and stable Respiratory status: spontaneous breathing Cardiovascular status: stable Anesthetic complications: no       Last Vitals:  Vitals:   05/30/16 1600 05/30/16 1700  BP:    Pulse: 80 80  Resp: 12 (!) 23  Temp: 37.7 C 37.6 C    Last Pain:  Vitals:   05/30/16 1500  TempSrc: Core (Comment)  PainSc:                  Anamari Galeas

## 2016-05-30 NOTE — Progress Notes (Signed)
Patient in OR 8 am for CABG, care will be taken over by CVTS team.

## 2016-05-30 NOTE — Progress Notes (Signed)
Patient ID: Erin [name], female   DOB: April 04, 1945, 71 y.o.   MRN: [id number] EVENING ROUNDS NOTE :     [address]       [place] [address]             [phone]                 Day of Surgery Procedure(s) (LRB): CORONARY ARTERY BYPASS GRAFTING (CABG), ON PUMP, TIMES FOUR, USING LEFT INTERNAL MAMMARY ARTERY AND ENDOSCOPICALLY HARVESTED BILATERAL GREATER SAPHENOUS VEINS WITH CLIPPING OF LEFT ATRIAL APPENDAGE (N/A)  Total Length of Stay:  LOS: 6 days  BP 135/90   Pulse 91   Temp 98.2 F (36.8 C)   Resp (!) 28   Ht 5\' 4"  (1.626 m)   Wt 175 lb (79.4 kg)   SpO2 98%   BMI 30.04 kg/m   .Intake/Output      04/11 0701 - 04/12 0700   P.O.    I.V. (mL/kg) 3199 (40.3)   Blood 611   NG/GT 60   IV Piggyback 900   Total Intake(mL/kg) 4770 (60.1)   Urine (mL/kg/hr) 1765 (1.6)   Blood 600 (0.5)   Chest Tube 270 (0.2)   Total Output 2635   Net +2135         . sodium chloride    . [START ON 05/31/2016] sodium chloride    . sodium chloride 10 mL/hr at 05/30/16 1600  . dexmedetomidine (PRECEDEX) IV infusion 0.101 mcg/kg/hr (05/30/16 2100)  . insulin (NOVOLIN-R) infusion 1.7 Units/hr (05/30/16 2107)  . lactated ringers 20 mL/hr at 05/30/16 2100  . lactated ringers 10 mL/hr at 05/30/16 2100  . nitroGLYCERIN 100 mcg/min (05/30/16 2100)  . phenylephrine (NEO-SYNEPHRINE) Adult infusion Stopped (05/30/16 1510)     Lab Results  Component Value Date   WBC 11.7 (H) 05/30/2016   HGB 10.4 (L) 05/30/2016   HCT 30.9 (L) 05/30/2016   PLT 79 (L) 05/30/2016   GLUCOSE 115 (H) 05/30/2016   ALT 16 05/24/2016   AST 22 05/24/2016   NA 137 05/30/2016   K 4.8 05/30/2016   CL 99 (L) 05/30/2016   CREATININE 0.60 05/30/2016   BUN 8 05/30/2016   CO2 25 05/30/2016   TSH 2.748 05/24/2016   INR 1.28 05/30/2016   Just extubated  Stable   Delight Ovens MD  Beeper [phone] Office [phone] 05/30/2016 9:16 PM

## 2016-05-31 ENCOUNTER — Inpatient Hospital Stay (HOSPITAL_COMMUNITY): Payer: Medicare HMO

## 2016-05-31 ENCOUNTER — Encounter (HOSPITAL_COMMUNITY): Payer: Self-pay | Admitting: Thoracic Surgery (Cardiothoracic Vascular Surgery)

## 2016-05-31 LAB — POCT I-STAT, CHEM 8
BUN: 11 mg/dL (ref 6–20)
BUN: 11 mg/dL (ref 6–20)
Calcium, Ion: 1.23 mmol/L (ref 1.15–1.40)
Calcium, Ion: 1.27 mmol/L (ref 1.15–1.40)
Chloride: 100 mmol/L — ABNORMAL LOW (ref 101–111)
Chloride: 98 mmol/L — ABNORMAL LOW (ref 101–111)
Creatinine, Ser: 0.8 mg/dL (ref 0.44–1.00)
Creatinine, Ser: 0.8 mg/dL (ref 0.44–1.00)
Glucose, Bld: 133 mg/dL — ABNORMAL HIGH (ref 65–99)
Glucose, Bld: 137 mg/dL — ABNORMAL HIGH (ref 65–99)
HCT: 27 % — ABNORMAL LOW (ref 36.0–46.0)
HCT: 28 % — ABNORMAL LOW (ref 36.0–46.0)
Hemoglobin: 9.2 g/dL — ABNORMAL LOW (ref 12.0–15.0)
Hemoglobin: 9.5 g/dL — ABNORMAL LOW (ref 12.0–15.0)
Potassium: 6.1 mmol/L — ABNORMAL HIGH (ref 3.5–5.1)
Potassium: 4.9 mmol/L (ref 3.5–5.1)
Sodium: 132 mmol/L — ABNORMAL LOW (ref 135–145)
Sodium: 131 mmol/L — ABNORMAL LOW (ref 135–145)
TCO2: 24 mmol/L (ref 0–100)
TCO2: 24 mmol/L (ref 0–100)

## 2016-05-31 LAB — CBC
HCT: 28.5 % — ABNORMAL LOW (ref 36.0–46.0)
HCT: 30.3 % — ABNORMAL LOW (ref 36.0–46.0)
Hemoglobin: 9.3 g/dL — ABNORMAL LOW (ref 12.0–15.0)
Hemoglobin: 9.7 g/dL — ABNORMAL LOW (ref 12.0–15.0)
MCH: 27.1 pg (ref 26.0–34.0)
MCH: 27.1 pg (ref 26.0–34.0)
MCHC: 32.6 g/dL (ref 30.0–36.0)
MCHC: 32 g/dL (ref 30.0–36.0)
MCV: 83.1 fL (ref 78.0–100.0)
MCV: 84.6 fL (ref 78.0–100.0)
Platelets: 77 10*3/uL — ABNORMAL LOW (ref 150–400)
Platelets: 80 10*3/uL — ABNORMAL LOW (ref 150–400)
RBC: 3.43 MIL/uL — ABNORMAL LOW (ref 3.87–5.11)
RBC: 3.58 MIL/uL — ABNORMAL LOW (ref 3.87–5.11)
RDW: 15.7 % — ABNORMAL HIGH (ref 11.5–15.5)
RDW: 16.2 % — ABNORMAL HIGH (ref 11.5–15.5)
WBC: 13.6 10*3/uL — ABNORMAL HIGH (ref 4.0–10.5)
WBC: 11.5 10*3/uL — ABNORMAL HIGH (ref 4.0–10.5)

## 2016-05-31 LAB — GLUCOSE, CAPILLARY
Glucose-Capillary: 102 mg/dL — ABNORMAL HIGH (ref 65–99)
Glucose-Capillary: 121 mg/dL — ABNORMAL HIGH (ref 65–99)
Glucose-Capillary: 121 mg/dL — ABNORMAL HIGH (ref 65–99)
Glucose-Capillary: 126 mg/dL — ABNORMAL HIGH (ref 65–99)
Glucose-Capillary: 140 mg/dL — ABNORMAL HIGH (ref 65–99)
Glucose-Capillary: 154 mg/dL — ABNORMAL HIGH (ref 65–99)
Glucose-Capillary: 154 mg/dL — ABNORMAL HIGH (ref 65–99)
Glucose-Capillary: 165 mg/dL — ABNORMAL HIGH (ref 65–99)
Glucose-Capillary: 183 mg/dL — ABNORMAL HIGH (ref 65–99)
Glucose-Capillary: 60 mg/dL — ABNORMAL LOW (ref 65–99)
Glucose-Capillary: 62 mg/dL — ABNORMAL LOW (ref 65–99)
Glucose-Capillary: 85 mg/dL (ref 65–99)
Glucose-Capillary: 86 mg/dL (ref 65–99)
Glucose-Capillary: 95 mg/dL (ref 65–99)
Glucose-Capillary: 99 mg/dL (ref 65–99)

## 2016-05-31 LAB — BASIC METABOLIC PANEL
Anion gap: 9 (ref 5–15)
BUN: 10 mg/dL (ref 6–20)
CO2: 23 mmol/L (ref 22–32)
Calcium: 8.1 mg/dL — ABNORMAL LOW (ref 8.9–10.3)
Chloride: 103 mmol/L (ref 101–111)
Creatinine, Ser: 0.74 mg/dL (ref 0.44–1.00)
GFR calc Af Amer: >60 mL/min (ref >60)
GFR calc non Af Amer: >60 mL/min (ref >60)
Glucose, Bld: 94 mg/dL (ref 65–99)
Potassium: 4.1 mmol/L (ref 3.5–5.1)
Sodium: 135 mmol/L (ref 135–145)

## 2016-05-31 LAB — HEMOGLOBIN A1C
Hgb A1c MFr Bld: 5.9 % — ABNORMAL HIGH (ref 4.8–5.6)
Mean Plasma Glucose: 123 mg/dL

## 2016-05-31 LAB — MAGNESIUM
Magnesium: 2.5 mg/dL — ABNORMAL HIGH (ref 1.7–2.4)
Magnesium: 2.2 mg/dL (ref 1.7–2.4)

## 2016-05-31 LAB — CREATININE, SERUM
Creatinine, Ser: 0.89 mg/dL (ref 0.44–1.00)
GFR calc Af Amer: >60 mL/min (ref >60)
GFR calc non Af Amer: >60 mL/min (ref >60)

## 2016-05-31 MED ORDER — ENOXAPARIN SODIUM 40 MG/0.4ML ~~LOC~~ SOLN: 40.0000 mg | Freq: Every day | SUBCUTANEOUS | Status: DC

## 2016-05-31 MED ORDER — FUROSEMIDE 10 MG/ML IJ SOLN
40.0000 mg | Freq: Once | INTRAMUSCULAR | Status: AC
  Administered 2016-05-31: 40 mg via INTRAVENOUS
  Filled 2016-05-31: qty 4

## 2016-05-31 MED ORDER — INSULIN ASPART 100 UNIT/ML ~~LOC~~ SOLN
0.0000 [IU] | SUBCUTANEOUS | Status: DC
  Administered 2016-05-31: 2 [IU] via SUBCUTANEOUS

## 2016-05-31 MED ORDER — SODIUM CHLORIDE 0.9 % IV SOLN
30.0000 meq | Freq: Once | INTRAVENOUS | Status: AC
  Administered 2016-05-31: 30 meq via INTRAVENOUS
  Filled 2016-05-31: qty 15

## 2016-05-31 MED ORDER — OXYCODONE HCL 5 MG PO TABS
5.0000 mg | ORAL_TABLET | ORAL | Status: DC | PRN
  Administered 2016-05-31 – 2016-06-02 (×11): 10 mg via ORAL
  Administered 2016-06-02: 5 mg via ORAL
  Administered 2016-06-02: 10 mg via ORAL
  Administered 2016-06-03 – 2016-06-05 (×4): 5 mg via ORAL
  Administered 2016-06-05: 10 mg via ORAL
  Administered 2016-06-05: 5 mg via ORAL
  Administered 2016-06-06 – 2016-06-07 (×3): 10 mg via ORAL
  Filled 2016-05-31 (×4): qty 2
  Filled 2016-05-31: qty 1
  Filled 2016-05-31 (×3): qty 2
  Filled 2016-05-31 (×4): qty 1
  Filled 2016-05-31 (×9): qty 2
  Filled 2016-05-31: qty 1

## 2016-05-31 MED ORDER — INSULIN ASPART 100 UNIT/ML ~~LOC~~ SOLN: 0.0000 [IU] | SUBCUTANEOUS | Status: DC

## 2016-05-31 MED FILL — Electrolyte-R (PH 7.4) Solution: INTRAVENOUS | Qty: 3000 | Status: AC

## 2016-05-31 MED FILL — Lidocaine HCl IV Inj 20 MG/ML: INTRAVENOUS | Qty: 10 | Status: AC

## 2016-05-31 MED FILL — Heparin Sodium (Porcine) Inj 1000 Unit/ML: INTRAMUSCULAR | Qty: 30 | Status: AC

## 2016-05-31 MED FILL — Mannitol IV Soln 20%: INTRAVENOUS | Qty: 500 | Status: AC

## 2016-05-31 MED FILL — Sodium Chloride IV Soln 0.9%: INTRAVENOUS | Qty: 2000 | Status: AC

## 2016-05-31 MED FILL — Sodium Bicarbonate IV Soln 8.4%: INTRAVENOUS | Qty: 100 | Status: AC

## 2016-05-31 NOTE — Plan of Care (Signed)
Problem: Activity: Goal: Risk for activity intolerance will decrease Outcome: Progressing Pt to be up OOB in AM  Problem: Bowel/Gastric: Goal: Gastrointestinal status for postoperative course will improve Outcome: Progressing Pt tolerating clears  Problem: Cardiac: Goal: Hemodynamic stability will improve Outcome: Progressing Within parameters Goal: Ability to maintain an adequate cardiac output will improve Outcome: Progressing Within parameters

## 2016-05-31 NOTE — Progress Notes (Signed)
T[facility] BRIEF SICU PROGRESS NOTE  1 Day Post-Op  S/P Procedure(s) (LRB): CORONARY ARTERY BYPASS GRAFTING (CABG), ON PUMP, TIMES FOUR, USING LEFT INTERNAL MAMMARY ARTERY AND ENDOSCOPICALLY HARVESTED BILATERAL GREATER SAPHENOUS VEINS WITH CLIPPING OF LEFT ATRIAL APPENDAGE (N/A)   Stable day NSR w/ stable BP Breathing comfortably w/ O2 sats 100% on 2 L/min UOP adequate  Plan: Continue current plan  Purcell Nails, MD 05/31/2016 7:47 PM
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
## 2016-05-31 NOTE — Progress Notes (Signed)
1 Day Post-Op Procedure(s) (LRB): CORONARY ARTERY BYPASS GRAFTING (CABG), ON PUMP, TIMES FOUR, USING LEFT INTERNAL MAMMARY ARTERY AND ENDOSCOPICALLY HARVESTED BILATERAL GREATER SAPHENOUS VEINS WITH CLIPPING OF LEFT ATRIAL APPENDAGE (N/A) Subjective: Some pain, denies nausea Asking fo rstronger pain med than tramadol, was taking Norco at home with only mild nausea  Objective: Vital signs in last 24 hours: Temp:  [97.2 F (36.2 C)-99.9 F (37.7 C)] 97.4 F (36.3 C) (04/12 0825) Pulse Rate:  [30-93] 80 (04/12 0800) Cardiac Rhythm: Normal sinus rhythm;Atrial paced (04/12 0800) Resp:  [12-30] 22 (04/12 0800) BP: (90-135)/(56-90) 117/70 (04/12 0800) SpO2:  [79 %-100 %] 100 % (04/12 0800) Arterial Line BP: (113-185)/(52-91) 162/66 (04/12 0800) FiO2 (%):  [40 %-50 %] 40 % (04/11 1915) Weight:  [184 lb 8.4 oz (83.7 kg)] 184 lb 8.4 oz (83.7 kg) (04/12 0500)  Hemodynamic parameters for last 24 hours: PAP: (28-49)/(13-27) 36/24 CO:  [2.6 L/min-5.9 L/min] 4.2 L/min CI:  [1.4 L/min/m2-3.2 L/min/m2] 2.3 L/min/m2  Intake/Output from previous day: 04/11 0701 - 04/12 0700 In: 5722.7 [P.O.:150; I.V.:3751.7; Blood:611; NG/GT:60; IV Piggyback:1150] Out: 3185 [Urine:2025; Blood:600; Chest Tube:560] Intake/Output this shift: Total I/O In: 30 [I.V.:30] Out: 45 [Urine:15; Chest Tube:30]  General appearance: alert, cooperative and no distress Neurologic: intact Heart: regular rate and rhythm Lungs: diminished breath sounds bibasilar Abdomen: normal findings: soft, non-tender  Lab Results:  Recent Labs  05/30/16 2110 05/31/16 0400  WBC 15.3* 13.6*  HGB 10.4* 9.3*  HCT 31.7* 28.5*  PLT 81* 77*   BMET:  Recent Labs  05/30/16 0413  05/30/16 2109 05/30/16 2110 05/31/16 0400  NA 134*  < > 136  --  135  K 4.0  < > 5.1  --  4.1  CL 101  < > 101  --  103  CO2 25  --   --   --  23  GLUCOSE 101*  < > 152*  --  94  BUN 8  < > 12  --  10  CREATININE 0.70  < > 0.70 0.77 0.74  CALCIUM 8.5*   --   --   --  8.1*  < > = values in this interval not displayed.  PT/INR:  Recent Labs  05/30/16 1513  LABPROT 16.1*  INR 1.28   ABG    Component Value Date/Time   PHART 7.301 (L) 05/30/2016 2139   HCO3 22.0 05/30/2016 2139   TCO2 23 05/30/2016 2139   ACIDBASEDEF 4.0 (H) 05/30/2016 2139   O2SAT 96.0 05/30/2016 2139   CBG (last 3)   Recent Labs  05/31/16 0509 05/31/16 0624 05/31/16 0824  GLUCAP 86 99 102*    Assessment/Plan: S/P Procedure(s) (LRB): CORONARY ARTERY BYPASS GRAFTING (CABG), ON PUMP, TIMES FOUR, USING LEFT INTERNAL MAMMARY ARTERY AND ENDOSCOPICALLY HARVESTED BILATERAL GREATER SAPHENOUS VEINS WITH CLIPPING OF LEFT ATRIAL APPENDAGE (N/A) -  CV- good hemodynamics- dc swan and a line  ASA, beta blocker, statin, restart ACE-I in a day or two  RESP_ atelectasis- IS  RENAL- creatinine and lytes OK, diurese  HEME- anemia required multiple units of PRBC intraoperatively, hgb OK this AM  Thrombocytopenia- no heparin or lovenox for now  ENDO- CBG well controlled, transition off insulin drip  Dc CT  SCD for DVT prophylaxis  Mobilize    LOS: 7 days    Loreli Slot 05/31/2016

## 2016-06-01 ENCOUNTER — Inpatient Hospital Stay (HOSPITAL_COMMUNITY): Payer: Medicare HMO

## 2016-06-01 LAB — CBC
HCT: 30.8 % — ABNORMAL LOW (ref 36.0–46.0)
Hemoglobin: 9.7 g/dL — ABNORMAL LOW (ref 12.0–15.0)
MCH: 26.7 pg (ref 26.0–34.0)
MCHC: 31.5 g/dL (ref 30.0–36.0)
MCV: 84.8 fL (ref 78.0–100.0)
Platelets: 93 10*3/uL — ABNORMAL LOW (ref 150–400)
RBC: 3.63 MIL/uL — ABNORMAL LOW (ref 3.87–5.11)
RDW: 16.4 % — ABNORMAL HIGH (ref 11.5–15.5)
WBC: 11.6 10*3/uL — ABNORMAL HIGH (ref 4.0–10.5)

## 2016-06-01 LAB — BASIC METABOLIC PANEL
Anion gap: 8 (ref 5–15)
BUN: 9 mg/dL (ref 6–20)
CO2: 23 mmol/L (ref 22–32)
Calcium: 8.4 mg/dL — ABNORMAL LOW (ref 8.9–10.3)
Chloride: 99 mmol/L — ABNORMAL LOW (ref 101–111)
Creatinine, Ser: 0.74 mg/dL (ref 0.44–1.00)
GFR calc Af Amer: >60 mL/min (ref >60)
GFR calc non Af Amer: >60 mL/min (ref >60)
Glucose, Bld: 118 mg/dL — ABNORMAL HIGH (ref 65–99)
Potassium: 4.6 mmol/L (ref 3.5–5.1)
Sodium: 130 mmol/L — ABNORMAL LOW (ref 135–145)

## 2016-06-01 LAB — GLUCOSE, CAPILLARY
Glucose-Capillary: 126 mg/dL — ABNORMAL HIGH (ref 65–99)
Glucose-Capillary: 107 mg/dL — ABNORMAL HIGH (ref 65–99)
Glucose-Capillary: 94 mg/dL (ref 65–99)
Glucose-Capillary: 110 mg/dL — ABNORMAL HIGH (ref 65–99)

## 2016-06-01 MED ORDER — INSULIN ASPART 100 UNIT/ML ~~LOC~~ SOLN
0.0000 [IU] | Freq: Three times a day (TID) | SUBCUTANEOUS | Status: DC
  Administered 2016-06-03: 3 [IU] via SUBCUTANEOUS

## 2016-06-01 MED ORDER — ASPIRIN EC 81 MG PO TBEC
81.0000 mg | DELAYED_RELEASE_TABLET | Freq: Every day | ORAL | Status: DC
Start: 2016-06-01 — End: 2016-06-07
  Administered 2016-06-01 – 2016-06-07 (×7): 81 mg via ORAL
  Filled 2016-06-01 (×7): qty 1

## 2016-06-01 MED ORDER — FUROSEMIDE 10 MG/ML IJ SOLN
40.0000 mg | Freq: Once | INTRAMUSCULAR | Status: AC
  Administered 2016-06-01: 40 mg via INTRAVENOUS
  Filled 2016-06-01: qty 4

## 2016-06-01 NOTE — Plan of Care (Signed)
Problem: Activity: Goal: Risk for activity intolerance will decrease Outcome: Progressing Pt up oob x2 4/12  Problem: Bowel/Gastric: Goal: Gastrointestinal status for postoperative course will improve Outcome: Progressing Passing flatus per pt  Problem: Cardiac: Goal: Hemodynamic stability will improve Outcome: Progressing Within parameters on no gtt's Goal: Ability to maintain an adequate cardiac output will improve Outcome: Progressing Within parameters
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
## 2016-06-01 NOTE — Progress Notes (Signed)
      [address]       [place] [address]             [phone]      Resting comfortably  Ambulated twice around unit  BP 111/72   Pulse 74   Temp 98.1 F (36.7 C) (Oral)   Resp (!) 21   Ht 5\' 4"  (1.626 m)   Wt 189 lb 9.5 oz (86 kg)   SpO2 99%   BMI 32.54 kg/m    Intake/Output Summary (Last 24 hours) at 06/01/16 1732 Last data filed at 06/01/16 1600  Gross per 24 hour  Intake              710 ml  Output             1500 ml  Net             -790 ml     Does not want to take ASA due to ulcers in past, will change to 81 mg enteric coated- should be able to tolerate that  Viviann Spare C. Dorris Fetch, MD [facility] [phone]
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
## 2016-06-01 NOTE — Progress Notes (Signed)
2 Days Post-Op Procedure(s) (LRB): CORONARY ARTERY BYPASS GRAFTING (CABG), ON PUMP, TIMES FOUR, USING LEFT INTERNAL MAMMARY ARTERY AND ENDOSCOPICALLY HARVESTED BILATERAL GREATER SAPHENOUS VEINS WITH CLIPPING OF LEFT ATRIAL APPENDAGE (N/A) Subjective: c/o back pain  Objective: Vital signs in last 24 hours: Temp:  [97.4 F (36.3 C)-98.5 F (36.9 C)] 98.5 F (36.9 C) (04/13 0300) Pulse Rate:  [69-83] 69 (04/13 0700) Cardiac Rhythm: Normal sinus rhythm;Atrial paced (04/13 0728) Resp:  [10-29] 15 (04/13 0700) BP: (110-168)/(62-113) 119/69 (04/13 0700) SpO2:  [97 %-100 %] 100 % (04/13 0700) Arterial Line BP: (162-164)/(66-76) 164/76 (04/12 0900) Weight:  [189 lb 9.5 oz (86 kg)] 189 lb 9.5 oz (86 kg) (04/13 0500)  Hemodynamic parameters for last 24 hours: PAP: (36-42)/(24-27) 42/27 CO:  [4.2 L/min] 4.2 L/min CI:  [2.3 L/min/m2] 2.3 L/min/m2  Intake/Output from previous day: 04/12 0701 - 04/13 0700 In: 1525 [P.O.:720; I.V.:440; IV Piggyback:365] Out: 1020 [Urine:900; Chest Tube:120] Intake/Output this shift: No intake/output data recorded.  General appearance: alert, cooperative and no distress Neurologic: intact Heart: regular rate and rhythm Lungs: diminished breath sounds bibasilar Abdomen: normal findings: soft, non-tender  Lab Results:  Recent Labs  05/31/16 1622  05/31/16 1802 06/01/16 0443  WBC 11.5*  --   --  11.6*  HGB 9.7*  < > 9.5* 9.7*  HCT 30.3*  < > 28.0* 30.8*  PLT 80*  --   --  93*  < > = values in this interval not displayed. BMET:  Recent Labs  05/31/16 0400  05/31/16 1802 06/01/16 0443  NA 135  < > 131* 130*  K 4.1  < > 4.9 4.6  CL 103  < > 98* 99*  CO2 23  --   --  23  GLUCOSE 94  < > 137* 118*  BUN 10  < > 11 9  CREATININE 0.74  < > 0.80 0.74  CALCIUM 8.1*  --   --  8.4*  < > = values in this interval not displayed.  PT/INR:  Recent Labs  05/30/16 1513  LABPROT 16.1*  INR 1.28   ABG    Component Value Date/Time   PHART 7.301 (L)  05/30/2016 2139   HCO3 22.0 05/30/2016 2139   TCO2 24 05/31/2016 1802   ACIDBASEDEF 4.0 (H) 05/30/2016 2139   O2SAT 96.0 05/30/2016 2139   CBG (last 3)   Recent Labs  05/31/16 2148 05/31/16 2321 06/01/16 0404  GLUCAP 165* 183* 126*    Assessment/Plan: S/P Procedure(s) (LRB): CORONARY ARTERY BYPASS GRAFTING (CABG), ON PUMP, TIMES FOUR, USING LEFT INTERNAL MAMMARY ARTERY AND ENDOSCOPICALLY HARVESTED BILATERAL GREATER SAPHENOUS VEINS WITH CLIPPING OF LEFT ATRIAL APPENDAGE (N/A) -  CV- stable, maintaining SR  RESP- IS for atelectasis  RENAL- creatinine Ok, hyponatremia  IV diuresis again today  ENDO- CBG low overnight- change to [facility] and HS  SCD for DVT prophylaxis  Mobility- ambulated 300' this AM!   LOS: 8 days    Loreli Slot 06/01/2016

## 2016-06-02 ENCOUNTER — Inpatient Hospital Stay (HOSPITAL_COMMUNITY): Payer: Medicare HMO

## 2016-06-02 LAB — TYPE AND SCREEN
ABO/RH(D): O POS
Antibody Screen: NEGATIVE
Unit division: 0
Unit division: 0
Unit division: 0
Unit division: 0
Unit division: 0
Unit division: 0

## 2016-06-02 LAB — CBC
HCT: 32.1 % — ABNORMAL LOW (ref 36.0–46.0)
Hemoglobin: 10 g/dL — ABNORMAL LOW (ref 12.0–15.0)
MCH: 26.8 pg (ref 26.0–34.0)
MCHC: 31.2 g/dL (ref 30.0–36.0)
MCV: 86.1 fL (ref 78.0–100.0)
Platelets: 112 10*3/uL — ABNORMAL LOW (ref 150–400)
RBC: 3.73 MIL/uL — ABNORMAL LOW (ref 3.87–5.11)
RDW: 16.4 % — ABNORMAL HIGH (ref 11.5–15.5)
WBC: 11.1 10*3/uL — ABNORMAL HIGH (ref 4.0–10.5)

## 2016-06-02 LAB — BPAM RBC
Blood Product Expiration Date: 201805012359
Blood Product Expiration Date: 201805032359
Blood Product Expiration Date: 201805032359
Blood Product Expiration Date: 201805032359
Blood Product Expiration Date: 201805032359
Blood Product Expiration Date: 201805032359
ISSUE DATE / TIME: 201804110800
ISSUE DATE / TIME: 201804110800
ISSUE DATE / TIME: 201804110800
ISSUE DATE / TIME: 201804110800
Unit Type and Rh: 5100
Unit Type and Rh: 5100
Unit Type and Rh: 5100
Unit Type and Rh: 5100
Unit Type and Rh: 5100
Unit Type and Rh: 5100

## 2016-06-02 LAB — GLUCOSE, CAPILLARY
Glucose-Capillary: 110 mg/dL — ABNORMAL HIGH (ref 65–99)
Glucose-Capillary: 106 mg/dL — ABNORMAL HIGH (ref 65–99)
Glucose-Capillary: 89 mg/dL (ref 65–99)
Glucose-Capillary: 127 mg/dL — ABNORMAL HIGH (ref 65–99)
Glucose-Capillary: 88 mg/dL (ref 65–99)

## 2016-06-02 LAB — BASIC METABOLIC PANEL
Anion gap: 8 (ref 5–15)
BUN: 9 mg/dL (ref 6–20)
CO2: 29 mmol/L (ref 22–32)
Calcium: 8.8 mg/dL — ABNORMAL LOW (ref 8.9–10.3)
Chloride: 96 mmol/L — ABNORMAL LOW (ref 101–111)
Creatinine, Ser: 0.76 mg/dL (ref 0.44–1.00)
GFR calc Af Amer: >60 mL/min (ref >60)
GFR calc non Af Amer: >60 mL/min (ref >60)
Glucose, Bld: 105 mg/dL — ABNORMAL HIGH (ref 65–99)
Potassium: 5 mmol/L (ref 3.5–5.1)
Sodium: 133 mmol/L — ABNORMAL LOW (ref 135–145)

## 2016-06-02 MED ORDER — METOPROLOL TARTRATE 25 MG/10 ML ORAL SUSPENSION
25.0000 mg | Freq: Two times a day (BID) | ORAL | Status: DC
  Filled 2016-06-02: qty 10

## 2016-06-02 MED ORDER — AMIODARONE HCL IN DEXTROSE 360-4.14 MG/200ML-% IV SOLN
INTRAVENOUS | Status: AC
  Filled 2016-06-02: qty 200

## 2016-06-02 MED ORDER — METOPROLOL TARTRATE 25 MG PO TABS
25.0000 mg | ORAL_TABLET | Freq: Two times a day (BID) | ORAL | Status: DC
  Administered 2016-06-02 – 2016-06-03 (×4): 25 mg via ORAL
  Filled 2016-06-02 (×4): qty 1

## 2016-06-02 MED ORDER — AMIODARONE HCL IN DEXTROSE 360-4.14 MG/200ML-% IV SOLN
30.0000 mg/h | INTRAVENOUS | Status: AC
  Administered 2016-06-02 – 2016-06-03 (×2): 30 mg/h via INTRAVENOUS
  Filled 2016-06-02 (×2): qty 200

## 2016-06-02 MED ORDER — AMIODARONE LOAD VIA INFUSION
150.0000 mg | Freq: Once | INTRAVENOUS | Status: AC
  Administered 2016-06-02: 150 mg via INTRAVENOUS
  Filled 2016-06-02: qty 83.34

## 2016-06-02 MED ORDER — FUROSEMIDE 40 MG PO TABS
40.0000 mg | ORAL_TABLET | Freq: Every day | ORAL | Status: DC
  Administered 2016-06-02 – 2016-06-03 (×2): 40 mg via ORAL
  Filled 2016-06-02 (×2): qty 1

## 2016-06-02 MED ORDER — AMIODARONE HCL IN DEXTROSE 360-4.14 MG/200ML-% IV SOLN
60.0000 mg/h | INTRAVENOUS | Status: AC
  Administered 2016-06-02 (×2): 60 mg/h via INTRAVENOUS
  Filled 2016-06-02: qty 200

## 2016-06-02 NOTE — Progress Notes (Signed)
3 Days Post-Op Procedure(s) (LRB): CORONARY ARTERY BYPASS GRAFTING (CABG), ON PUMP, TIMES FOUR, USING LEFT INTERNAL MAMMARY ARTERY AND ENDOSCOPICALLY HARVESTED BILATERAL GREATER SAPHENOUS VEINS WITH CLIPPING OF LEFT ATRIAL APPENDAGE (N/A) Subjective: Does not feel as well this AM Went into atrial fib while up Chest incision hurting more this AM  Objective: Vital signs in last 24 hours: Temp:  [96.9 F (36.1 C)-98.3 F (36.8 C)] 98.2 F (36.8 C) (04/14 0351) Pulse Rate:  [25-131] 102 (04/14 0724) Cardiac Rhythm: Normal sinus rhythm (04/14 0400) Resp:  [16-29] 16 (04/14 0724) BP: (102-154)/(59-96) 120/76 (04/14 0724) SpO2:  [91 %-100 %] 99 % (04/14 0724) Weight:  [193 lb 2 oz (87.6 kg)] 193 lb 2 oz (87.6 kg) (04/14 0500)  Hemodynamic parameters for last 24 hours:    Intake/Output from previous day: 04/13 0701 - 04/14 0700 In: 400 [P.O.:400] Out: 2050 [Urine:2050] Intake/Output this shift: No intake/output data recorded.  General appearance: alert, cooperative and anxious Neurologic: intact Heart: irregularly irregular rhythm Lungs: diminished breath sounds bibasilar Abdomen: normal findings: soft, non-tender  Lab Results:  Recent Labs  06/01/16 0443 06/02/16 0140  WBC 11.6* 11.1*  HGB 9.7* 10.0*  HCT 30.8* 32.1*  PLT 93* 112*   BMET:  Recent Labs  06/01/16 0443 06/02/16 0140  NA 130* 133*  K 4.6 5.0  CL 99* 96*  CO2 23 29  GLUCOSE 118* 105*  BUN 9 9  CREATININE 0.74 0.76  CALCIUM 8.4* 8.8*    PT/INR:  Recent Labs  05/30/16 1513  LABPROT 16.1*  INR 1.28   ABG    Component Value Date/Time   PHART 7.301 (L) 05/30/2016 2139   HCO3 22.0 05/30/2016 2139   TCO2 24 05/31/2016 1802   ACIDBASEDEF 4.0 (H) 05/30/2016 2139   O2SAT 96.0 05/30/2016 2139   CBG (last 3)   Recent Labs  06/01/16 0843 06/01/16 1211 06/01/16 2115  GLUCAP 107* 94 110*    Assessment/Plan: S/P Procedure(s) (LRB): CORONARY ARTERY BYPASS GRAFTING (CABG), ON PUMP, TIMES  FOUR, USING LEFT INTERNAL MAMMARY ARTERY AND ENDOSCOPICALLY HARVESTED BILATERAL GREATER SAPHENOUS VEINS WITH CLIPPING OF LEFT ATRIAL APPENDAGE (N/A) -  CV- in atrial fib, not surprising as she was in it on arrival to ED. Has left atrial clip in place  D/w Dr. Elease Hashimoto- will give IV amiodarone  Increase metoprolol  RESP- continue IS  RENAL- creatinine and lytes OK  PO lasix  ENDO- CBG well controlled  SCD + enoxaparin for DVT prophylaxis  Mobilize as tolerated   LOS: 9 days    Loreli Slot 06/02/2016

## 2016-06-02 NOTE — Progress Notes (Signed)
Patient in Afib/Flutter with HR fluctuating as high as 130's. BP 130-120's/76-80's. Metoprolol 5 mg given IV. Patient warm and dry. C/o chest incisional pain and shortness of breath. Patient medicated with Oxycodone 10 mg for pian. O2 sat 98% on O2 @ 2L [place].

## 2016-06-02 NOTE — Plan of Care (Signed)
Problem: Activity: Goal: Risk for activity intolerance will decrease Outcome: Progressing Pt walked x3  Problem: Bowel/Gastric: Goal: Gastrointestinal status for postoperative course will improve Outcome: Progressing Passing flatus per pt  Problem: Cardiac: Goal: Hemodynamic stability will improve Outcome: Progressing Within parameters Goal: Ability to maintain an adequate cardiac output will improve Outcome: Progressing Within parameters

## 2016-06-02 NOTE — Progress Notes (Signed)
      [address]       [place] [address]             [phone]      Still very anxious about atrial fib this AM  BP 121/71   Pulse 72   Temp 97.9 F (36.6 C) (Oral)   Resp 17   Ht 5\' 4"  (1.626 m)   Wt 193 lb 2 oz (87.6 kg)   SpO2 93%   BMI 33.15 kg/m   Back in SR on amiodarone  Jb Dulworth C. Dorris Fetch, MD [facility] [phone]

## 2016-06-02 NOTE — Progress Notes (Addendum)
Pt now in NSR.  Melodye Ped

## 2016-06-02 NOTE — Progress Notes (Signed)
   06/02/16 0530  Mobility  Activity Ambulate in hall  Level of Assistance Moderate assist, patient does 50-74%  Assistive Device Front wheel walker  Distance Ambulated (ft) 270 ft  Ambulation Response Tolerated fair  Range of Motion Active;All extremities  pt experienced shortness of breath while ambulating. She stopped to catch her breath twice during her walk. O2 sat as low as 91% on O2@ 2L [place].
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
## 2016-06-03 ENCOUNTER — Inpatient Hospital Stay (HOSPITAL_COMMUNITY): Payer: Medicare HMO

## 2016-06-03 LAB — BASIC METABOLIC PANEL
Anion gap: 5 (ref 5–15)
BUN: 11 mg/dL (ref 6–20)
CO2: 30 mmol/L (ref 22–32)
Calcium: 8.5 mg/dL — ABNORMAL LOW (ref 8.9–10.3)
Chloride: 96 mmol/L — ABNORMAL LOW (ref 101–111)
Creatinine, Ser: 0.78 mg/dL (ref 0.44–1.00)
GFR calc Af Amer: >60 mL/min (ref >60)
GFR calc non Af Amer: >60 mL/min (ref >60)
Glucose, Bld: 116 mg/dL — ABNORMAL HIGH (ref 65–99)
Potassium: 4.9 mmol/L (ref 3.5–5.1)
Sodium: 131 mmol/L — ABNORMAL LOW (ref 135–145)

## 2016-06-03 LAB — GLUCOSE, CAPILLARY
Glucose-Capillary: 120 mg/dL — ABNORMAL HIGH (ref 65–99)
Glucose-Capillary: 118 mg/dL — ABNORMAL HIGH (ref 65–99)
Glucose-Capillary: 151 mg/dL — ABNORMAL HIGH (ref 65–99)

## 2016-06-03 LAB — CBC
HCT: 31.8 % — ABNORMAL LOW (ref 36.0–46.0)
Hemoglobin: 9.9 g/dL — ABNORMAL LOW (ref 12.0–15.0)
MCH: 26.5 pg (ref 26.0–34.0)
MCHC: 31.1 g/dL (ref 30.0–36.0)
MCV: 85.3 fL (ref 78.0–100.0)
Platelets: 133 10*3/uL — ABNORMAL LOW (ref 150–400)
RBC: 3.73 MIL/uL — ABNORMAL LOW (ref 3.87–5.11)
RDW: 16.4 % — ABNORMAL HIGH (ref 11.5–15.5)
WBC: 9.5 10*3/uL (ref 4.0–10.5)

## 2016-06-03 MED ORDER — AMIODARONE HCL 200 MG PO TABS
400.0000 mg | ORAL_TABLET | Freq: Two times a day (BID) | ORAL | Status: DC
  Administered 2016-06-03 – 2016-06-04 (×3): 400 mg via ORAL
  Filled 2016-06-03 (×3): qty 2

## 2016-06-03 MED ORDER — ZOLPIDEM TARTRATE 5 MG PO TABS
5.0000 mg | ORAL_TABLET | Freq: Every evening | ORAL | Status: DC | PRN
  Administered 2016-06-04 – 2016-06-06 (×3): 5 mg via ORAL
  Filled 2016-06-03 (×2): qty 1

## 2016-06-03 MED ORDER — SODIUM CHLORIDE 0.9 % IV SOLN: 250.0000 mL | INTRAVENOUS | Status: DC | PRN

## 2016-06-03 MED ORDER — ALUM & MAG HYDROXIDE-SIMETH 200-200-20 MG/5ML PO SUSP: 15.0000 mL | ORAL | Status: DC | PRN

## 2016-06-03 MED ORDER — MAGNESIUM HYDROXIDE 400 MG/5ML PO SUSP: 30.0000 mL | Freq: Every day | ORAL | Status: DC | PRN

## 2016-06-03 MED ORDER — MOVING RIGHT ALONG BOOK
Freq: Once | Status: AC
  Administered 2016-06-03: 18:00:00
  Filled 2016-06-03: qty 1

## 2016-06-03 MED ORDER — GUAIFENESIN-DM 100-10 MG/5ML PO SYRP: 15.0000 mL | ORAL_SOLUTION | ORAL | Status: DC | PRN

## 2016-06-03 MED ORDER — SODIUM CHLORIDE 0.9% FLUSH
3.0000 mL | Freq: Two times a day (BID) | INTRAVENOUS | Status: DC
  Administered 2016-06-03 – 2016-06-06 (×5): 3 mL via INTRAVENOUS

## 2016-06-03 MED ORDER — SODIUM CHLORIDE 0.9% FLUSH: 3.0000 mL | INTRAVENOUS | Status: DC | PRN
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
## 2016-06-03 NOTE — Plan of Care (Signed)
Problem: Pain Management: Goal: Pain level will decrease Outcome: Progressing Pt complained of chest pain at incision site, pain described as an ache.  Pain medication requested and was effective in reducing pain to a tolerable level.  Pt demonstrated correct use of pillow for splinting.  Pt stated pain improving daily.  Progressing towards goal.

## 2016-06-03 NOTE — Progress Notes (Signed)
Patient w/o complaint since being transferred, sat in chair, walked with assistance from family and walker.  Patient and family reading post heart surgery book.

## 2016-06-03 NOTE — Progress Notes (Signed)
4 Days Post-Op Procedure(s) (LRB): CORONARY ARTERY BYPASS GRAFTING (CABG), ON PUMP, TIMES FOUR, USING LEFT INTERNAL MAMMARY ARTERY AND ENDOSCOPICALLY HARVESTED BILATERAL GREATER SAPHENOUS VEINS WITH CLIPPING OF LEFT ATRIAL APPENDAGE (N/A) Subjective: Feels much better today  Objective: Vital signs in last 24 hours: Temp:  [97.8 F (36.6 C)-98.1 F (36.7 C)] 97.8 F (36.6 C) (04/15 0734) Pulse Rate:  [59-100] 70 (04/15 0800) Cardiac Rhythm: Heart block (04/15 0800) Resp:  [17-18] 18 (04/14 1930) BP: (108-142)/(63-98) 123/76 (04/15 0800) SpO2:  [93 %-100 %] 95 % (04/15 0800) Weight:  [190 lb (86.2 kg)] 190 lb (86.2 kg) (04/15 0500)  Hemodynamic parameters for last 24 hours:    Intake/Output from previous day: 04/14 0701 - 04/15 0700 In: 1304.9 [P.O.:870; I.V.:434.9] Out: 1525 [Urine:1525] Intake/Output this shift: Total I/O In: 16.7 [I.V.:16.7] Out: -   General appearance: alert, cooperative and no distress Neurologic: intact Heart: regular rate and rhythm Lungs: diminished breath sounds bibasilar Abdomen: normal findings: soft, non-tender Wound: clean and dry  Lab Results:  Recent Labs  06/02/16 0140 06/03/16 0250  WBC 11.1* 9.5  HGB 10.0* 9.9*  HCT 32.1* 31.8*  PLT 112* 133*   BMET:  Recent Labs  06/02/16 0140 06/03/16 0250  NA 133* 131*  K 5.0 4.9  CL 96* 96*  CO2 29 30  GLUCOSE 105* 116*  BUN 9 11  CREATININE 0.76 0.78  CALCIUM 8.8* 8.5*    PT/INR: No results for input(s): LABPROT, INR in the last 72 hours. ABG    Component Value Date/Time   PHART 7.301 (L) 05/30/2016 2139   HCO3 22.0 05/30/2016 2139   TCO2 24 05/31/2016 1802   ACIDBASEDEF 4.0 (H) 05/30/2016 2139   O2SAT 96.0 05/30/2016 2139   CBG (last 3)   Recent Labs  06/02/16 1630 06/02/16 2110 06/03/16 0733  GLUCAP 127* 88 120*    Assessment/Plan: S/P Procedure(s) (LRB): CORONARY ARTERY BYPASS GRAFTING (CABG), ON PUMP, TIMES FOUR, USING LEFT INTERNAL MAMMARY ARTERY AND  ENDOSCOPICALLY HARVESTED BILATERAL GREATER SAPHENOUS VEINS WITH CLIPPING OF LEFT ATRIAL APPENDAGE (N/A) Plan for transfer to step-down: see transfer orders  CV- maintaining SR on IV amiodarone- transition to PO  Continue lopressor, statin, ASA  RESP- is for atelectasis  RENAL- mild hyponatremia- follow  ENDO_ CBG well controlled  Mobility- ambulated already this AM   LOS: 10 days    Loreli Slot 06/03/2016

## 2016-06-03 NOTE — Progress Notes (Signed)
Patient admitted to [facility] as transfer from [facility].  VSS, patient without complaint.

## 2016-06-04 LAB — GLUCOSE, CAPILLARY
Glucose-Capillary: 115 mg/dL — ABNORMAL HIGH (ref 65–99)
Glucose-Capillary: 100 mg/dL — ABNORMAL HIGH (ref 65–99)
Glucose-Capillary: 111 mg/dL — ABNORMAL HIGH (ref 65–99)
Glucose-Capillary: 148 mg/dL — ABNORMAL HIGH (ref 65–99)
Glucose-Capillary: 108 mg/dL — ABNORMAL HIGH (ref 65–99)

## 2016-06-04 LAB — CBC
HCT: 32.5 % — ABNORMAL LOW (ref 36.0–46.0)
Hemoglobin: 10.2 g/dL — ABNORMAL LOW (ref 12.0–15.0)
MCH: 26.5 pg (ref 26.0–34.0)
MCHC: 31.4 g/dL (ref 30.0–36.0)
MCV: 84.4 fL (ref 78.0–100.0)
Platelets: 192 10*3/uL (ref 150–400)
RBC: 3.85 MIL/uL — ABNORMAL LOW (ref 3.87–5.11)
RDW: 16.2 % — ABNORMAL HIGH (ref 11.5–15.5)
WBC: 7 10*3/uL (ref 4.0–10.5)

## 2016-06-04 LAB — BASIC METABOLIC PANEL
Anion gap: 10 (ref 5–15)
BUN: 12 mg/dL (ref 6–20)
CO2: 28 mmol/L (ref 22–32)
Calcium: 8.2 mg/dL — ABNORMAL LOW (ref 8.9–10.3)
Chloride: 91 mmol/L — ABNORMAL LOW (ref 101–111)
Creatinine, Ser: 0.67 mg/dL (ref 0.44–1.00)
GFR calc Af Amer: >60 mL/min (ref >60)
GFR calc non Af Amer: >60 mL/min (ref >60)
Glucose, Bld: 105 mg/dL — ABNORMAL HIGH (ref 65–99)
Potassium: 3.3 mmol/L — ABNORMAL LOW (ref 3.5–5.1)
Sodium: 129 mmol/L — ABNORMAL LOW (ref 135–145)

## 2016-06-04 MED ORDER — AMIODARONE LOAD VIA INFUSION
150.0000 mg | Freq: Once | INTRAVENOUS | Status: AC
  Administered 2016-06-04: 150 mg via INTRAVENOUS
  Filled 2016-06-04: qty 83.34

## 2016-06-04 MED ORDER — POTASSIUM CHLORIDE CRYS ER 20 MEQ PO TBCR
40.0000 meq | EXTENDED_RELEASE_TABLET | Freq: Two times a day (BID) | ORAL | Status: DC
  Administered 2016-06-04 – 2016-06-06 (×5): 40 meq via ORAL
  Filled 2016-06-04 (×6): qty 2

## 2016-06-04 MED ORDER — AMIODARONE HCL IN DEXTROSE 360-4.14 MG/200ML-% IV SOLN
30.0000 mg/h | INTRAVENOUS | Status: DC
  Administered 2016-06-04: 30 mg/h via INTRAVENOUS
  Filled 2016-06-04: qty 200

## 2016-06-04 MED ORDER — AMIODARONE HCL IN DEXTROSE 360-4.14 MG/200ML-% IV SOLN
60.0000 mg/h | INTRAVENOUS | Status: AC
  Administered 2016-06-04 (×2): 60 mg/h via INTRAVENOUS
  Filled 2016-06-04 (×2): qty 200

## 2016-06-04 MED ORDER — FUROSEMIDE 40 MG PO TABS
40.0000 mg | ORAL_TABLET | Freq: Two times a day (BID) | ORAL | Status: DC
  Administered 2016-06-04: 40 mg via ORAL
  Filled 2016-06-04 (×2): qty 1

## 2016-06-04 MED ORDER — AMIODARONE LOAD VIA INFUSION
150.0000 mg | Freq: Once | INTRAVENOUS | Status: DC
  Filled 2016-06-04: qty 83.34

## 2016-06-04 MED ORDER — METOPROLOL TARTRATE 25 MG PO TABS
37.5000 mg | ORAL_TABLET | Freq: Two times a day (BID) | ORAL | Status: DC
  Administered 2016-06-04 – 2016-06-07 (×7): 37.5 mg via ORAL
  Filled 2016-06-04 (×7): qty 1

## 2016-06-04 NOTE — Care Management Important Message (Signed)
Important Message  Patient Details  Name: Erin [name] MRN: [id number] Date of Birth: 02-22-1945   Medicare Important Message Given:  Yes    [name] 06/04/2016, 2:25 PM

## 2016-06-04 NOTE — Progress Notes (Signed)
CARDIAC REHAB PHASE I   PRE:  Rate/Rhythm: 118 a fib  BP:  Sitting: 129/85        SaO2: 97 RA  MODE:  Ambulation: 290 ft   POST:  Rate/Rhythm: 124 a fib  BP:  Sitting: 134/82         SaO2: 98 RA  Pt ambulated 290 ft on RA, rolling walker, slow, mostly steady gait, tolerated fairly well. Pt c/o "feeling my heart racing," denies any other complaints, standing rest x4. Pt more aware of postural cues/sternal precautions after working with PT. Encouraged IS, additional ambulation x1 today. Pt to recliner after walk, feet elevated, call bell within reach. Will follow.  [id number] Joylene Grapes, RN, BSN 06/04/2016 11:00 AM

## 2016-06-04 NOTE — Progress Notes (Signed)
Patient has a scant amount of drainage stemming from the top portion of chest incision. Gauze dressing was applied. Will continue to monitor
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
## 2016-06-04 NOTE — Progress Notes (Signed)
Progress Note  Patient Name: Erin [name] Date of Encounter: 06/04/2016  Primary Cardiologist: New   Subjective   Breathing is OK  Feels heart racing  Chest wall hurts    Inpatient Medications    Scheduled Meds: . acetaminophen  1,000 mg Oral Q6H  . amiodarone  400 mg Oral BID  . aspirin EC  81 mg Oral Daily  . atorvastatin  80 mg Oral q1800  . bisacodyl  10 mg Oral Daily   Or  . bisacodyl  10 mg Rectal Daily  . cycloSPORINE  1 drop Both Eyes BID  . docusate sodium  200 mg Oral Daily  . furosemide  40 mg Oral BID  . levothyroxine  112 mcg Oral QAC breakfast  . magnesium oxide  200 mg Oral QPC breakfast  . metoprolol tartrate  37.5 mg Oral BID  . multivitamin with minerals  1 tablet Oral Daily  . pantoprazole  40 mg Oral Daily  . potassium chloride  40 mEq Oral BID  . sodium chloride flush  3 mL Intravenous Q12H   Continuous Infusions:  PRN Meds: sodium chloride, alum & mag hydroxide-simeth, guaiFENesin-dextromethorphan, magnesium hydroxide, morphine injection, ondansetron (ZOFRAN) IV, oxyCODONE, sodium chloride flush, traMADol, zolpidem   Vital Signs    Vitals:   06/03/16 1157 06/03/16 2105 06/04/16 0540 06/04/16 0605  BP: (!) 147/82 123/72  127/84  Pulse: 70 (!) 102  99  Resp: 18 18  18   Temp: 98 F (36.7 C) 98 F (36.7 C)  97.6 F (36.4 C)  TempSrc: Oral Oral  Oral  SpO2: 100% 92%  94%  Weight:   186 lb 11.7 oz (84.7 kg)   Height:        Intake/Output Summary (Last 24 hours) at 06/04/16 0809 Last data filed at 06/03/16 1700  Gross per 24 hour  Intake           771.21 ml  Output                0 ml  Net           771.21 ml   Filed Weights   06/03/16 0500 06/03/16 1142 06/04/16 0540  Weight: 190 lb (86.2 kg) 195 lb 1.7 oz (88.5 kg) 186 lb 11.7 oz (84.7 kg)    Telemetry    Atrial fib with RVR   Hr 100s to 120   - Personally Reviewed  ECG      Physical Exam   GEN: No acute distress.   Neck: Positive  JVD Cardiac:  Irreg irreg  No S3  ,  no murmurs, rubs, or gallops.  Respiratory: Clear to auscultation bilaterally. GI: Soft, nontender, non-distended  MS: 1+ edema; No deformity. Neuro:  Nonfocal  Psych: Normal affect   Labs    Chemistry Recent Labs Lab 06/02/16 0140 06/03/16 0250 06/04/16 0203  NA 133* 131* 129*  K 5.0 4.9 3.3*  CL 96* 96* 91*  CO2 29 30 28   GLUCOSE 105* 116* 105*  BUN 9 11 12   CREATININE 0.76 0.78 0.67  CALCIUM 8.8* 8.5* 8.2*  GFRNONAA >60 >60 >60  GFRAA >60 >60 >60  ANIONGAP 8 5 10      Hematology Recent Labs Lab 06/02/16 0140 06/03/16 0250 06/04/16 0203  WBC 11.1* 9.5 7.0  RBC 3.73* 3.73* 3.85*  HGB 10.0* 9.9* 10.2*  HCT 32.1* 31.8* 32.5*  MCV 86.1 85.3 84.4  MCH 26.8 26.5 26.5  MCHC 31.2 31.1 31.4  RDW 16.4* 16.4* 16.2*  PLT 112* 133* 192    Cardiac EnzymesNo results for input(s): TROPONINI in the last 168 hours. No results for input(s): TROPIPOC in the last 168 hours.   BNPNo results for input(s): BNP, PROBNP in the last 168 hours.   DDimer No results for input(s): DDIMER in the last 168 hours.   Radiology    Dg Chest Port 1 View  Result Date: 06/03/2016 CLINICAL DATA:  Status post CABG and left atrial appendage clipping. EXAM: PORTABLE CHEST 1 VIEW COMPARISON:  06/02/2016 and prior exams FINDINGS: Cardiomegaly, CABG changes and left atrial appendage clip again noted. Mild pulmonary vascular congestion and left lower lung atelectasis have decreased. There is no evidence of pneumothorax. No other interval changes noted. IMPRESSION: Improved mild pulmonary vascular congestion and left lower lung atelectasis. Electronically Signed   By: Harmon Pier M.D.   On: 06/03/2016 07:44    Cardiac Studies     Patient Profile     71 y.o. female s/;p CABG with atrial clipping  Now Post Op with Atrial fib   Assessment & Plan    1  PAF  CHADSVASC 4  Pt back in atrial fibrillation with RVR  I would resume IV amio to get rate/rhythm control   Pt needs to load Pt s/p clpping of L  atrial appendage  Continue diuresis  As this should help maintain SR  FOllow I/O  May benefit from additional IV esp if HR elevated    2  CAD Post op Day # 5 CABG and L atrial appendage clipping    3  HTN  BP OK    4  HL  ON lipitor 80     Signed, Dietrich Pates, MD  06/04/2016, 8:09 AM

## 2016-06-04 NOTE — Progress Notes (Addendum)
[address]       [place] [address]             [phone]      5 Days Post-Op Procedure(s) (LRB): CORONARY ARTERY BYPASS GRAFTING (CABG), ON PUMP, TIMES FOUR, USING LEFT INTERNAL MAMMARY ARTERY AND ENDOSCOPICALLY HARVESTED BILATERAL GREATER SAPHENOUS VEINS WITH CLIPPING OF LEFT ATRIAL APPENDAGE (N/A) Subjective: In afib with RVR in 130's  Objective: Vital signs in last 24 hours: Temp:  [97.6 F (36.4 C)-98 F (36.7 C)] 97.6 F (36.4 C) (04/16 0605) Pulse Rate:  [66-102] 99 (04/16 0605) Cardiac Rhythm: Atrial fibrillation (04/15 2100) Resp:  [18] 18 (04/16 0605) BP: (123-150)/(72-84) 127/84 (04/16 0605) SpO2:  [92 %-100 %] 94 % (04/16 0605) Weight:  [186 lb 11.7 oz (84.7 kg)-195 lb 1.7 oz (88.5 kg)] 186 lb 11.7 oz (84.7 kg) (04/16 0540)  Hemodynamic parameters for last 24 hours:    Intake/Output from previous day: 04/15 0701 - 04/16 0700 In: 787.9 [P.O.:720; I.V.:67.9] Out: -  Intake/Output this shift: No intake/output data recorded.  General appearance: alert, cooperative, no distress and feels tired Heart: irregularly irregular rhythm Lungs: min dim in bases Abdomen: benign Extremities: + pitting edema bil LE's Wound: incis healing well  Lab Results:  Recent Labs  06/03/16 0250 06/04/16 0203  WBC 9.5 7.0  HGB 9.9* 10.2*  HCT 31.8* 32.5*  PLT 133* 192   BMET:  Recent Labs  06/03/16 0250 06/04/16 0203  NA 131* 129*  K 4.9 3.3*  CL 96* 91*  CO2 30 28  GLUCOSE 116* 105*  BUN 11 12  CREATININE 0.78 0.67  CALCIUM 8.5* 8.2*    PT/INR: No results for input(s): LABPROT, INR in the last 72 hours. ABG    Component Value Date/Time   PHART 7.301 (L) 05/30/2016 2139   HCO3 22.0 05/30/2016 2139   TCO2 24 05/31/2016 1802   ACIDBASEDEF 4.0 (H) 05/30/2016 2139   O2SAT 96.0 05/30/2016 2139   CBG (last 3)   Recent Labs  06/03/16 1636 06/03/16 2103 06/04/16 0603  GLUCAP 151* 115* 100*    Meds Scheduled Meds: .  acetaminophen  1,000 mg Oral Q6H  . amiodarone  400 mg Oral BID  . aspirin EC  81 mg Oral Daily  . atorvastatin  80 mg Oral q1800  . bisacodyl  10 mg Oral Daily   Or  . bisacodyl  10 mg Rectal Daily  . cycloSPORINE  1 drop Both Eyes BID  . docusate sodium  200 mg Oral Daily  . furosemide  40 mg Oral Daily  . insulin aspart  0-15 Units Subcutaneous TID WC  . levothyroxine  112 mcg Oral QAC breakfast  . magnesium oxide  200 mg Oral QPC breakfast  . metoprolol tartrate  25 mg Oral BID  . multivitamin with minerals  1 tablet Oral Daily  . pantoprazole  40 mg Oral Daily  . sodium chloride flush  3 mL Intravenous Q12H   Continuous Infusions: PRN Meds:.sodium chloride, alum & mag hydroxide-simeth, guaiFENesin-dextromethorphan, magnesium hydroxide, morphine injection, ondansetron (ZOFRAN) IV, oxyCODONE, sodium chloride flush, traMADol, zolpidem  Xrays Dg Chest Port 1 View  Result Date: 06/03/2016 CLINICAL DATA:  Status post CABG and left atrial appendage clipping. EXAM: PORTABLE CHEST 1 VIEW COMPARISON:  06/02/2016 and prior exams FINDINGS: Cardiomegaly, CABG changes and left atrial appendage clip again noted. Mild pulmonary vascular congestion and left lower lung atelectasis have decreased. There is no evidence of pneumothorax. No other interval  changes noted. IMPRESSION: Improved mild pulmonary vascular congestion and left lower lung atelectasis. Electronically Signed   By: Harmon Pier M.D.   On: 06/03/2016 07:44    Assessment/Plan: S/P Procedure(s) (LRB): CORONARY ARTERY BYPASS GRAFTING (CABG), ON PUMP, TIMES FOUR, USING LEFT INTERNAL MAMMARY ARTERY AND ENDOSCOPICALLY HARVESTED BILATERAL GREATER SAPHENOUS VEINS WITH CLIPPING OF LEFT ATRIAL APPENDAGE (N/A)  1 slow steady progress 2 afib with RVR, on PO , rate is up at least in part d/t deconditioning. Shoule tolerate an increase in beta blocker dose 3 sugars ok, d.c insulin at this time- encourage diet control of CHO's, A1C 5.9 4 I think  she would benefit from BID lasix, need K+ replacement- watch sodium 5 rehab and pulm toilet as able 6 anemia slightly improved-monitor  LOS: 11 days    GOLD,WAYNE E 06/04/2016 Patient seen and examined, agree with above Atrial fib earlier now back in SR  Milan C. Dorris Fetch, MD [facility] [phone]
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
## 2016-06-04 NOTE — Clinical Social Work Note (Signed)
Clinical Social Work Assessment  Patient Details  Name: Erin [name] MRN: [id number] Date of Birth: 1945-08-12  Date of referral:  06/04/16               Reason for consult:  Discharge Planning                Permission sought to share information with:  Family Supports Permission granted to share information::  Yes, Verbal Permission Granted  Name::     Emonee Winkowski  Agency::     Relationship::  son  Contact Information:  [phone]  Housing/Transportation Living arrangements for the past 2 months:  Single Family Home Source of Information:  Patient Patient Interpreter Needed:  None Criminal Activity/Legal Involvement Pertinent to Current Situation/Hospitalization:  No - Comment as needed Significant Relationships:  Adult Children Lives with:  Self Do you feel safe going back to the place where you live?  Yes Need for family participation in patient care:  Yes (Comment)  Care giving concerns:  Patient lives at home but has support from adult son    Facilities manager / plan:  CSW met patient at bedside to offer support and discuss patient discharge needs. Patient stated she has been at a SNF in the past and is agreeable to go back to SNF. Patients son (POA), stated he has reached out to [facility] in [place] for patient to be placed. Son stated he has already spoken to facility and they have bed ready for patient as well as reach out to insurance for authorization from insurance.CSW will follow up with facility to make sure bed is still available for patient.  Employment status:  Retired Forensic scientist:  Medicare PT Recommendations:  [place] / Referral to community resources:  [place]  Patient/Family's Response to care:  Patient verbalized appreciation and understanding for CSW role and involvement in care.  Patient/Family's Understanding of and Emotional Response to Diagnosis, Current Treatment, and Prognosis:   Patient with good understanding of current medical state and limitations around most recent hospitalization. Patient agreeable with SNF placement in hopes of transitioning to a more stable living environment.  Emotional Assessment Appearance:  Appears stated age Attitude/Demeanor/Rapport:  Unable to Assess Affect (typically observed):  Other Orientation:  Oriented to Situation, Oriented to  Time, Oriented to Place, Oriented to Self Alcohol / Substance use:  Not Applicable Psych involvement (Current and /or in the community):  No (Comment)  Discharge Needs  Concerns to be addressed:  No discharge needs identified Readmission within the last 30 days:  No Current discharge risk:  None Barriers to Discharge:  No Barriers Identified   Wende Neighbors, LCSW 06/04/2016, 7:46 PM

## 2016-06-04 NOTE — Progress Notes (Signed)
Patient ID: Erin [name], female   DOB: 10-08-1945, 71 y.o.   MRN: [id number] Patient seems to be doing well with very manageable back pain no leg pain   moves lower extremities well,  ambulating  Continue rehab in recovery from her bypass at some point will need to revise her lumbar fusion however this is non emergent as she is able to mobilize with manageable pain.  Will await determination from cardiology and cardiothoracic surgery with regard to best timing of when she is safe to take back for revision.

## 2016-06-04 NOTE — Clinical Social Work Placement (Signed)
   CLINICAL SOCIAL WORK PLACEMENT  NOTE  Date:  06/04/2016  Patient Details  Name: Erin [name] MRN: [id number] Date of Birth: May 04, 1945  Clinical Social Work is seeking post-discharge placement for this patient at the Skilled  Nursing Facility level of care (*CSW will initial, date and re-position this form in  chart as items are completed):  Yes   Patient/family provided with [place] Clinical Social Work Department's list of facilities offering this level of care within the geographic area requested by the patient (or if unable, by the patient's family).  Yes   Patient/family informed of their freedom to choose among providers that offer the needed level of care, that participate in Medicare, Medicaid or managed care program needed by the patient, have an available bed and are willing to accept the patient.  Yes   Patient/family informed of [place]'s ownership interest in [facility] and [facility], as well as of the fact that they are under no obligation to receive care at these facilities.  PASRR submitted to EDS on       PASRR number received on       Existing PASRR number confirmed on       FL2 transmitted to all facilities in geographic area requested by pt/family on       FL2 transmitted to all facilities within larger geographic area on       Patient informed that his/her managed care company has contracts with or will negotiate with certain facilities, including the following:            Patient/family informed of bed offers received.  Patient chooses bed at       Physician recommends and patient chooses bed at      Patient to be transferred to   on  .  Patient to be transferred to facility by       Patient family notified on   of transfer.  Name of family member notified:        PHYSICIAN Please sign FL2     Additional Comment:    _______________________________________________ Althea Charon, LCSW 06/04/2016, 8:01 PM

## 2016-06-04 NOTE — Progress Notes (Signed)
Orthopedic Tech Progress Note Patient Details:  Erin [name] [birth year] [id number]  Patient ID: Erin [name], female   DOB: 1945-12-16, 71 y.o.   MRN: [id number]   Erin [name] 06/04/2016, 11:22 AMcalled bio-tech for Lumbar brace.

## 2016-06-05 ENCOUNTER — Inpatient Hospital Stay (HOSPITAL_COMMUNITY): Payer: Medicare HMO

## 2016-06-05 LAB — BASIC METABOLIC PANEL
Anion gap: 9 (ref 5–15)
BUN: 9 mg/dL (ref 6–20)
CO2: 30 mmol/L (ref 22–32)
Calcium: 8.3 mg/dL — ABNORMAL LOW (ref 8.9–10.3)
Chloride: 93 mmol/L — ABNORMAL LOW (ref 101–111)
Creatinine, Ser: 0.74 mg/dL (ref 0.44–1.00)
GFR calc Af Amer: >60 mL/min (ref >60)
GFR calc non Af Amer: >60 mL/min (ref >60)
Glucose, Bld: 125 mg/dL — ABNORMAL HIGH (ref 65–99)
Potassium: 3.3 mmol/L — ABNORMAL LOW (ref 3.5–5.1)
Sodium: 132 mmol/L — ABNORMAL LOW (ref 135–145)

## 2016-06-05 LAB — GLUCOSE, CAPILLARY
Glucose-Capillary: 113 mg/dL — ABNORMAL HIGH (ref 65–99)
Glucose-Capillary: 119 mg/dL — ABNORMAL HIGH (ref 65–99)
Glucose-Capillary: 112 mg/dL — ABNORMAL HIGH (ref 65–99)
Glucose-Capillary: 118 mg/dL — ABNORMAL HIGH (ref 65–99)

## 2016-06-05 MED ORDER — FUROSEMIDE 10 MG/ML IJ SOLN
80.0000 mg | Freq: Once | INTRAMUSCULAR | Status: AC
  Administered 2016-06-05: 80 mg via INTRAVENOUS
  Filled 2016-06-05: qty 8

## 2016-06-05 MED ORDER — LISINOPRIL 10 MG PO TABS: 10.0000 mg | ORAL_TABLET | Freq: Every day | ORAL | Status: DC

## 2016-06-05 MED ORDER — TRIAMTERENE-HCTZ 37.5-25 MG PO TABS
1.0000 | ORAL_TABLET | Freq: Every day | ORAL | Status: DC
  Administered 2016-06-05 – 2016-06-07 (×3): 1 via ORAL
  Filled 2016-06-05 (×3): qty 1

## 2016-06-05 MED ORDER — AMIODARONE HCL 200 MG PO TABS
400.0000 mg | ORAL_TABLET | Freq: Two times a day (BID) | ORAL | Status: DC
  Administered 2016-06-05 – 2016-06-07 (×5): 400 mg via ORAL
  Filled 2016-06-05 (×5): qty 2

## 2016-06-05 MED ORDER — FUROSEMIDE 40 MG PO TABS
40.0000 mg | ORAL_TABLET | Freq: Every day | ORAL | Status: DC
  Administered 2016-06-06 – 2016-06-07 (×2): 40 mg via ORAL
  Filled 2016-06-05 (×2): qty 1

## 2016-06-05 MED ORDER — POTASSIUM CHLORIDE CRYS ER 20 MEQ PO TBCR
40.0000 meq | EXTENDED_RELEASE_TABLET | Freq: Once | ORAL | Status: AC
  Administered 2016-06-05: 40 meq via ORAL
  Filled 2016-06-05: qty 2

## 2016-06-05 NOTE — Progress Notes (Addendum)
      [address]       [place] [address]             [phone]        6 Days Post-Op Procedure(s) (LRB): CORONARY ARTERY BYPASS GRAFTING (CABG), ON PUMP, TIMES FOUR, USING LEFT INTERNAL MAMMARY ARTERY AND ENDOSCOPICALLY HARVESTED BILATERAL GREATER SAPHENOUS VEINS WITH CLIPPING OF LEFT ATRIAL APPENDAGE (N/A)  Subjective: Patient sitting in chair, eating breakfast. She has no specific complaints this am.  Objective: Vital signs in last 24 hours: Temp:  [97.5 F (36.4 C)-98.4 F (36.9 C)] 98.4 F (36.9 C) (04/17 0456) Pulse Rate:  [70-80] 80 (04/17 0456) Cardiac Rhythm: Normal sinus rhythm (04/17 0700) Resp:  [18] 18 (04/17 0456) BP: (140-160)/(78-84) 151/84 (04/17 0456) SpO2:  [95 %-97 %] 97 % (04/17 0456) Weight:  [83.5 kg (184 lb)] 83.5 kg (184 lb) (04/17 0504)  Pre op weight 79 kg Current Weight  06/05/16 83.5 kg (184 lb)      Intake/Output from previous day: 04/16 0701 - 04/17 0700 In: 600 [P.O.:600] Out: 2801 [Urine:2800; Stool:1]   Physical Exam:  Cardiovascular: RRR Pulmonary: Crackles Abdomen: Soft, non tender, bowel sounds present. Extremities: Bilateral lower extremity edema. Minor sero sanguinous drainage left thigh wound. Wounds: Clean and dry.  No erythema or signs of infection.  Lab Results: CBC: Recent Labs  06/03/16 0250 06/04/16 0203  WBC 9.5 7.0  HGB 9.9* 10.2*  HCT 31.8* 32.5*  PLT 133* 192   BMET:  Recent Labs  06/04/16 0203 06/05/16 0214  NA 129* 132*  K 3.3* 3.3*  CL 91* 93*  CO2 28 30  GLUCOSE 105* 125*  BUN 12 9  CREATININE 0.67 0.74  CALCIUM 8.2* 8.3*    PT/INR:  Lab Results  Component Value Date   INR 1.28 05/30/2016   INR 1.07 05/30/2016   INR 1.06 05/25/2016   ABG:  INR: Will add last result for INR, ABG once components are confirmed Will add last 4 CBG results once components are confirmed  Assessment/Plan:  1. CV - Previous a fib. SR this am. Hypertensive. On Amiodarone drip,  Lopressor 37.5 mg bid.  Will change Amiodarone to oral and discuss with Dr. Dorris Fetch if should restart Maxzide for better BP control. Had facial swelling and seen in ED so must avoid ACE. 2.  Pulmonary - On room air. No CXR so ordered PA/LAT. Encourage incentive spirometer. 3. Volume Overload - On Lasix 40 mg bid 4.  Acute blood loss anemia - H and H yesterday stable at 10.2 and 32.5 5. Supplement potassium 6. Hypothyroidism-TSH drawn in April 2.748. Continue Levothyroxine. 7. Remove EPW in am 8. Will need SNF when ready for discharge   ZIMMERMAN,DONIELLE MPA-C 06/05/2016,7:45 AM Patient seen and examined, agree with above Will decrease lasix to let us catch up to hypokalemia Restart maxzide Overall looks great  Viviann Spare C. Dorris Fetch, MD [facility] [phone]

## 2016-06-05 NOTE — Progress Notes (Signed)
Progress Note  Patient Name: Erin [name] Date of Encounter: 06/05/2016  Primary Cardiologist: New   Subjective   SOB with walking  Chest wall sore   Inpatient Medications    Scheduled Meds: . aspirin EC  81 mg Oral Daily  . atorvastatin  80 mg Oral q1800  . bisacodyl  10 mg Oral Daily   Or  . bisacodyl  10 mg Rectal Daily  . cycloSPORINE  1 drop Both Eyes BID  . docusate sodium  200 mg Oral Daily  . furosemide  40 mg Oral BID  . levothyroxine  112 mcg Oral QAC breakfast  . magnesium oxide  200 mg Oral QPC breakfast  . metoprolol tartrate  37.5 mg Oral BID  . multivitamin with minerals  1 tablet Oral Daily  . pantoprazole  40 mg Oral Daily  . potassium chloride  40 mEq Oral BID  . sodium chloride flush  3 mL Intravenous Q12H   Continuous Infusions: . amiodarone 30 mg/hr (06/05/16 0615)   PRN Meds: sodium chloride, alum & mag hydroxide-simeth, guaiFENesin-dextromethorphan, magnesium hydroxide, morphine injection, ondansetron (ZOFRAN) IV, oxyCODONE, sodium chloride flush, traMADol, zolpidem   Vital Signs    Vitals:   06/04/16 1330 06/04/16 2034 06/05/16 0456 06/05/16 0504  BP: (!) 160/78 140/78 (!) 151/84   Pulse: 70 74 80   Resp: 18 18 18    Temp: 97.5 F (36.4 C) 98.2 F (36.8 C) 98.4 F (36.9 C)   TempSrc: Oral Oral Oral   SpO2: 95% 95% 97%   Weight:    184 lb (83.5 kg)  Height:        Intake/Output Summary (Last 24 hours) at 06/05/16 0709 Last data filed at 06/05/16 0024  Gross per 24 hour  Intake              600 ml  Output             2801 ml  Net            -2201 ml   Filed Weights   06/03/16 1142 06/04/16 0540 06/05/16 0504  Weight: 195 lb 1.7 oz (88.5 kg) 186 lb 11.7 oz (84.7 kg) 184 lb (83.5 kg)    Telemetry    SR    - Personally Reviewed  ECG      Physical Exam   GEN: No acute distress.   Neck: Positive  JVD Cardiac:  RRR    No S3  , no murmurs, rubs, or gallops.  Respiratory: Clear to auscultation bilaterally. GI: Soft,  nontender, non-distended  MS: Tr+ edema; No deformity. Neuro:  Nonfocal  Psych: Normal affect   Labs    Chemistry  Recent Labs Lab 06/03/16 0250 06/04/16 0203 06/05/16 0214  NA 131* 129* 132*  K 4.9 3.3* 3.3*  CL 96* 91* 93*  CO2 30 28 30   GLUCOSE 116* 105* 125*  BUN 11 12 9   CREATININE 0.78 0.67 0.74  CALCIUM 8.5* 8.2* 8.3*  GFRNONAA >60 >60 >60  GFRAA >60 >60 >60  ANIONGAP 5 10 9      Hematology  Recent Labs Lab 06/02/16 0140 06/03/16 0250 06/04/16 0203  WBC 11.1* 9.5 7.0  RBC 3.73* 3.73* 3.85*  HGB 10.0* 9.9* 10.2*  HCT 32.1* 31.8* 32.5*  MCV 86.1 85.3 84.4  MCH 26.8 26.5 26.5  MCHC 31.2 31.1 31.4  RDW 16.4* 16.4* 16.2*  PLT 112* 133* 192    Cardiac EnzymesNo results for input(s): TROPONINI in the last 168 hours. No results  for input(s): TROPIPOC in the last 168 hours.   BNPNo results for input(s): BNP, PROBNP in the last 168 hours.   DDimer No results for input(s): DDIMER in the last 168 hours.   Radiology    No results found.  Cardiac Studies     Patient Profile     71 y.o. female s/;p CABG with atrial clipping  Now Post Op with Atrial fib   Assessment & Plan    1  PAF  CHADSVASC 4  Pt  Back in SR  Will switch to po amiodarone  Follow    2  CAD Post op Day # 6 CABG and L atrial appendage clipping  Would give IV lasix x 1 and follow output  Replete KCL    3  HTN BP up at times  Diuresis should help    4  HL  On statin        Signed, Dietrich Pates, MD  06/05/2016, 7:09 AM

## 2016-06-05 NOTE — Progress Notes (Signed)
CARDIAC REHAB PHASE I   PRE:  Rate/Rhythm: 87 SR few PACS  BP:  Supine: 142/76  Sitting:   Standing:    SaO2: 92%RA  MODE:  Ambulation: 290 ft   POST:  Rate/Rhythm: 87 SR  BP:  Supine: 159/81  Sitting:   Standing:    SaO2: 94%RA 1010-1042 Assisted to bathroom and then she walked 290 ft on RA with asst x 1. Put back brace on prior to walk. Pt stopped twice to rest. She stayed close to walker and stood upright. Assisted back to bed as pt stated she had been in recliner for hours earlier. Tolerated well. Call bell in reach.   Luetta Nutting, RN BSN  06/05/2016 10:38 AM

## 2016-06-06 LAB — BASIC METABOLIC PANEL
Anion gap: 11 (ref 5–15)
BUN: 6 mg/dL (ref 6–20)
CO2: 29 mmol/L (ref 22–32)
Calcium: 9.1 mg/dL (ref 8.9–10.3)
Chloride: 92 mmol/L — ABNORMAL LOW (ref 101–111)
Creatinine, Ser: 0.88 mg/dL (ref 0.44–1.00)
GFR calc Af Amer: >60 mL/min (ref >60)
GFR calc non Af Amer: >60 mL/min (ref >60)
Glucose, Bld: 180 mg/dL — ABNORMAL HIGH (ref 65–99)
Potassium: 4.3 mmol/L (ref 3.5–5.1)
Sodium: 132 mmol/L — ABNORMAL LOW (ref 135–145)

## 2016-06-06 LAB — GLUCOSE, CAPILLARY
Glucose-Capillary: 119 mg/dL — ABNORMAL HIGH (ref 65–99)
Glucose-Capillary: 134 mg/dL — ABNORMAL HIGH (ref 65–99)
Glucose-Capillary: 137 mg/dL — ABNORMAL HIGH (ref 65–99)
Glucose-Capillary: 139 mg/dL — ABNORMAL HIGH (ref 65–99)

## 2016-06-06 LAB — MAGNESIUM: Magnesium: 1.4 mg/dL — ABNORMAL LOW (ref 1.7–2.4)

## 2016-06-06 MED ORDER — POTASSIUM CHLORIDE CRYS ER 20 MEQ PO TBCR
40.0000 meq | EXTENDED_RELEASE_TABLET | Freq: Once | ORAL | Status: AC
  Administered 2016-06-06: 40 meq via ORAL
  Filled 2016-06-06: qty 2

## 2016-06-06 MED ORDER — FUROSEMIDE 10 MG/ML IJ SOLN
60.0000 mg | Freq: Once | INTRAMUSCULAR | Status: AC
  Administered 2016-06-06: 60 mg via INTRAVENOUS
  Filled 2016-06-06: qty 6

## 2016-06-06 MED ORDER — POTASSIUM CHLORIDE CRYS ER 20 MEQ PO TBCR: 20.0000 meq | EXTENDED_RELEASE_TABLET | Freq: Once | ORAL | Status: DC

## 2016-06-06 MED ORDER — FUROSEMIDE 10 MG/ML IJ SOLN: 80.0000 mg | Freq: Once | INTRAMUSCULAR | Status: DC

## 2016-06-06 MED ORDER — AMLODIPINE BESYLATE 5 MG PO TABS
5.0000 mg | ORAL_TABLET | Freq: Every day | ORAL | Status: DC
  Administered 2016-06-06 – 2016-06-07 (×2): 5 mg via ORAL
  Filled 2016-06-06: qty 1

## 2016-06-06 NOTE — Progress Notes (Signed)
CSW has reached out to [facility] (SNF) to see patients progressing being place. Facility stated they are still trying to get authorization through patient insurance company before being able to take patient. CSW still available for support and discharge need.  Marrianne Mood, MSW,  [employer] [phone]

## 2016-06-06 NOTE — Progress Notes (Signed)
Pacing wires removed per order. Patient instructed on VS q15 x4 and bedrest x 1 hr.

## 2016-06-06 NOTE — Progress Notes (Signed)
Progress Note  Patient Name: Erin [name] Date of Encounter: 06/06/2016  Primary Cardiologist: New   Subjective   Chest sore  Breathing OK at rest  Feeling better    Inpatient Medications    Scheduled Meds: . amiodarone  400 mg Oral BID  . aspirin EC  81 mg Oral Daily  . atorvastatin  80 mg Oral q1800  . bisacodyl  10 mg Oral Daily   Or  . bisacodyl  10 mg Rectal Daily  . cycloSPORINE  1 drop Both Eyes BID  . docusate sodium  200 mg Oral Daily  . furosemide  40 mg Oral Daily  . levothyroxine  112 mcg Oral QAC breakfast  . magnesium oxide  200 mg Oral QPC breakfast  . metoprolol tartrate  37.5 mg Oral BID  . multivitamin with minerals  1 tablet Oral Daily  . pantoprazole  40 mg Oral Daily  . potassium chloride  40 mEq Oral BID  . sodium chloride flush  3 mL Intravenous Q12H  . triamterene-hydrochlorothiazide  1 tablet Oral Daily   Continuous Infusions: . sodium chloride     PRN Meds: sodium chloride, alum & mag hydroxide-simeth, guaiFENesin-dextromethorphan, magnesium hydroxide, morphine injection, ondansetron (ZOFRAN) IV, oxyCODONE, sodium chloride flush, traMADol, zolpidem   Vital Signs    Vitals:   06/05/16 0504 06/05/16 1338 06/05/16 2053 06/06/16 0500  BP:  (!) 144/72 134/80 (!) 148/76  Pulse:  66 86 75  Resp:  18 18 18   Temp:  97.8 F (36.6 C) 99.3 F (37.4 C) 98.5 F (36.9 C)  TempSrc:  Oral Oral Oral  SpO2:  95% 95% 96%  Weight: 184 lb (83.5 kg)   172 lb 12.8 oz (78.4 kg)  Height:        Intake/Output Summary (Last 24 hours) at 06/06/16 0834 Last data filed at 06/06/16 0600  Gross per 24 hour  Intake              720 ml  Output             1800 ml  Net            -1080 ml   Net 2.35 Positive   Filed Weights   06/04/16 0540 06/05/16 0504 06/06/16 0500  Weight: 186 lb 11.7 oz (84.7 kg) 184 lb (83.5 kg) 172 lb 12.8 oz (78.4 kg)    Telemetry       SR   - Personally Reviewed  ECG      Physical Exam   GEN: No acute distress.   Neck:   Neck full   Cardiac:  RRR    No S3  , no murmurs, rubs, or gallops.  Respiratory: rhonchi bilat L greater than R   GI: Soft, nontender, non-distended  MS: 1+ edema; No deformity. Neuro:  Nonfocal  Psych: Normal affect   Labs    Chemistry  Recent Labs Lab 06/03/16 0250 06/04/16 0203 06/05/16 0214  NA 131* 129* 132*  K 4.9 3.3* 3.3*  CL 96* 91* 93*  CO2 30 28 30   GLUCOSE 116* 105* 125*  BUN 11 12 9   CREATININE 0.78 0.67 0.74  CALCIUM 8.5* 8.2* 8.3*  GFRNONAA >60 >60 >60  GFRAA >60 >60 >60  ANIONGAP 5 10 9      Hematology  Recent Labs Lab 06/02/16 0140 06/03/16 0250 06/04/16 0203  WBC 11.1* 9.5 7.0  RBC 3.73* 3.73* 3.85*  HGB 10.0* 9.9* 10.2*  HCT 32.1* 31.8* 32.5*  MCV 86.1 85.3  84.4  MCH 26.8 26.5 26.5  MCHC 31.2 31.1 31.4  RDW 16.4* 16.4* 16.2*  PLT 112* 133* 192    Cardiac EnzymesNo results for input(s): TROPONINI in the last 168 hours. No results for input(s): TROPIPOC in the last 168 hours.   BNPNo results for input(s): BNP, PROBNP in the last 168 hours.   DDimer No results for input(s): DDIMER in the last 168 hours.   Radiology    Dg Chest 2 View  Result Date: 06/05/2016 CLINICAL DATA:  Pneumothorax.  CABG. EXAM: CHEST  2 VIEW COMPARISON:  06/03/2016. FINDINGS: Cardiac enlargement persists. Lung volumes are increased. LEFT atrial appendage clip noted. LEFT effusion is unchanged. Mild LEFT basilar subsegmental atelectasis. No pneumothorax. Cervical fusion. IMPRESSION: Improved aeration.  No pneumothorax. Electronically Signed   By: Elsie Stain M.D.   On: 06/05/2016 09:11    Cardiac Studies     Patient Profile     71 y.o. female s/;p CABG with atrial clipping  Now Post Op with Atrial fib   Assessment & Plan    1  PAF  CHADSVASC 4  Pt  Maintaining SR on po amio  Would continue 400 bid for now  At D/C prob 400 bid for 1 wk then 200 bid.  F/U as outpt    Additional lasix prior to d/c   2  CAD Post op Day # 7 CABG and L atrial appendage  clipping  Will give additional IV lasix x1  later today Renal function is OK  Add additional K    3  HTN   Amlodipine started           Signed, Dietrich Pates, MD  06/06/2016, 8:34 AM
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
## 2016-06-06 NOTE — Progress Notes (Signed)
CARDIAC REHAB PHASE I   PRE:  Rate/Rhythm: 78 SR  BP:  Supine: 138/96  Sitting:   Standing:    SaO2: 97%RA  MODE:  Ambulation: 290 ft   POST:  Rate/Rhythm: 75 SR  BP:  Supine: 155/79  Sitting:   Standing:    SaO2: 97%RA 1330-1350 Pt walked 290 ft on RA with rolling walker with steady gait. Back brace put on prior to walk. Tolerated well. No bathroom and then to bed for pacing wire removal.  Luetta Nutting, RN BSN  06/06/2016 1:48 PM

## 2016-06-06 NOTE — Progress Notes (Addendum)
[address]       [place] [address]             [phone]      7 Days Post-Op Procedure(s) (LRB): CORONARY ARTERY BYPASS GRAFTING (CABG), ON PUMP, TIMES FOUR, USING LEFT INTERNAL MAMMARY ARTERY AND ENDOSCOPICALLY HARVESTED BILATERAL GREATER SAPHENOUS VEINS WITH CLIPPING OF LEFT ATRIAL APPENDAGE (N/A) Subjective: Feels pretty well , still fairly dyspneic with ambulation per the physical therapist  Objective: Vital signs in last 24 hours: Temp:  [97.8 F (36.6 C)-99.3 F (37.4 C)] 98.5 F (36.9 C) (04/18 0500) Pulse Rate:  [66-86] 75 (04/18 0500) Cardiac Rhythm: Normal sinus rhythm (04/18 0700) Resp:  [18] 18 (04/18 0500) BP: (134-148)/(72-80) 148/76 (04/18 0500) SpO2:  [95 %-96 %] 96 % (04/18 0500) Weight:  [172 lb 12.8 oz (78.4 kg)] 172 lb 12.8 oz (78.4 kg) (04/18 0500)  Hemodynamic parameters for last 24 hours:    Intake/Output from previous day: 04/17 0701 - 04/18 0700 In: 960 [P.O.:960] Out: 1800 [Urine:1800] Intake/Output this shift: No intake/output data recorded.  General appearance: alert, cooperative and no distress Heart: regular rate and rhythm Lungs: dim in bases Abdomen: benign Extremities: + LEedema Wound: incis healing well, no erethema  Lab Results:  Recent Labs  06/04/16 0203  WBC 7.0  HGB 10.2*  HCT 32.5*  PLT 192   BMET:  Recent Labs  06/04/16 0203 06/05/16 0214  NA 129* 132*  K 3.3* 3.3*  CL 91* 93*  CO2 28 30  GLUCOSE 105* 125*  BUN 12 9  CREATININE 0.67 0.74  CALCIUM 8.2* 8.3*    PT/INR: No results for input(s): LABPROT, INR in the last 72 hours. ABG    Component Value Date/Time   PHART 7.301 (L) 05/30/2016 2139   HCO3 22.0 05/30/2016 2139   TCO2 24 05/31/2016 1802   ACIDBASEDEF 4.0 (H) 05/30/2016 2139   O2SAT 96.0 05/30/2016 2139   CBG (last 3)   Recent Labs  06/05/16 1652 06/05/16 2047 06/06/16 0651  GLUCAP 112* 118* 119*    Meds Scheduled Meds: . amiodarone  400 mg Oral BID  .  aspirin EC  81 mg Oral Daily  . atorvastatin  80 mg Oral q1800  . bisacodyl  10 mg Oral Daily   Or  . bisacodyl  10 mg Rectal Daily  . cycloSPORINE  1 drop Both Eyes BID  . docusate sodium  200 mg Oral Daily  . furosemide  40 mg Oral Daily  . levothyroxine  112 mcg Oral QAC breakfast  . magnesium oxide  200 mg Oral QPC breakfast  . metoprolol tartrate  37.5 mg Oral BID  . multivitamin with minerals  1 tablet Oral Daily  . pantoprazole  40 mg Oral Daily  . potassium chloride  40 mEq Oral BID  . sodium chloride flush  3 mL Intravenous Q12H  . triamterene-hydrochlorothiazide  1 tablet Oral Daily   Continuous Infusions: . sodium chloride     PRN Meds:.sodium chloride, alum & mag hydroxide-simeth, guaiFENesin-dextromethorphan, magnesium hydroxide, morphine injection, ondansetron (ZOFRAN) IV, oxyCODONE, sodium chloride flush, traMADol, zolpidem  Xrays Dg Chest 2 View  Result Date: 06/05/2016 CLINICAL DATA:  Pneumothorax.  CABG. EXAM: CHEST  2 VIEW COMPARISON:  06/03/2016. FINDINGS: Cardiac enlargement persists. Lung volumes are increased. LEFT atrial appendage clip noted. LEFT effusion is unchanged. Mild LEFT basilar subsegmental atelectasis. No pneumothorax. Cervical fusion. IMPRESSION: Improved aeration.  No pneumothorax. Electronically Signed   By: Dale Brenas.D.  On: 06/05/2016 09:11    Assessment/Plan: S/P Procedure(s) (LRB): CORONARY ARTERY BYPASS GRAFTING (CABG), ON PUMP, TIMES FOUR, USING LEFT INTERNAL MAMMARY ARTERY AND ENDOSCOPICALLY HARVESTED BILATERAL GREATER SAPHENOUS VEINS WITH CLIPPING OF LEFT ATRIAL APPENDAGE (N/A)  1 Sinus rhythm with PAC's, no further afib 2 BP still elevated - cant restart accupril d/t allergy. Will add norvasc 3 no new labs 4 working with physical therapy 5 SW working on SNF placement 6 no new labs 7 sugars controlled 8 cont diuresis, recheck bmet   LOS: 13 days    GOLD,WAYNE E 06/06/2016 Patient seen and examined, agree with  above Continue lasix Ready for SNF when bed available

## 2016-06-07 LAB — GLUCOSE, CAPILLARY: Glucose-Capillary: 128 mg/dL — ABNORMAL HIGH (ref 65–99)

## 2016-06-07 MED ORDER — AMLODIPINE BESYLATE 5 MG PO TABS: 5.0000 mg | ORAL_TABLET | Freq: Every day | ORAL | Status: DC

## 2016-06-07 MED ORDER — HYDROCODONE-ACETAMINOPHEN 10-325 MG PO TABS
1.0000 | ORAL_TABLET | Freq: Four times a day (QID) | ORAL | 0 refills | Status: DC | PRN
Start: 2016-06-07 — End: 2016-08-01

## 2016-06-07 MED ORDER — FUROSEMIDE 40 MG PO TABS: 40.0000 mg | ORAL_TABLET | Freq: Every day | ORAL | Status: DC

## 2016-06-07 MED ORDER — ASPIRIN 81 MG PO TBEC: 81.0000 mg | DELAYED_RELEASE_TABLET | Freq: Every day | ORAL | Status: DC

## 2016-06-07 MED ORDER — METOPROLOL TARTRATE 5 MG/5ML IV SOLN
2.5000 mg | INTRAVENOUS | Status: AC
  Administered 2016-06-07: 2.5 mg via INTRAVENOUS
  Filled 2016-06-07: qty 5

## 2016-06-07 MED ORDER — METOPROLOL TARTRATE 37.5 MG PO TABS: 37.5000 mg | ORAL_TABLET | Freq: Two times a day (BID) | ORAL | Status: DC

## 2016-06-07 MED ORDER — ATORVASTATIN CALCIUM 80 MG PO TABS: 80.0000 mg | ORAL_TABLET | Freq: Every day | ORAL | Status: DC

## 2016-06-07 MED ORDER — APIXABAN 5 MG PO TABS
5.0000 mg | ORAL_TABLET | Freq: Two times a day (BID) | ORAL | Status: DC
  Administered 2016-06-07: 5 mg via ORAL
  Filled 2016-06-07: qty 1

## 2016-06-07 MED ORDER — POTASSIUM CHLORIDE ER 10 MEQ PO TBCR: 10.0000 meq | EXTENDED_RELEASE_TABLET | Freq: Every day | ORAL | Status: DC

## 2016-06-07 MED ORDER — APIXABAN 5 MG PO TABS: 5.0000 mg | ORAL_TABLET | Freq: Two times a day (BID) | ORAL | 1 refills | Status: DC

## 2016-06-07 MED ORDER — AMIODARONE HCL 200 MG PO TABS: 400.0000 mg | ORAL_TABLET | Freq: Two times a day (BID) | ORAL | Status: DC

## 2016-06-07 NOTE — Progress Notes (Addendum)
      [address]       [place] [address]             [phone]        8 Days Post-Op Procedure(s) (LRB): CORONARY ARTERY BYPASS GRAFTING (CABG), ON PUMP, TIMES FOUR, USING LEFT INTERNAL MAMMARY ARTERY AND ENDOSCOPICALLY HARVESTED BILATERAL GREATER SAPHENOUS VEINS WITH CLIPPING OF LEFT ATRIAL APPENDAGE (N/A)  Subjective: Patient without complaints.   Objective: Vital signs in last 24 hours: Temp:  [98.1 F (36.7 C)-98.6 F (37 C)] 98.3 F (36.8 C) (04/19 0217) Pulse Rate:  [70-124] 124 (04/19 0217) Cardiac Rhythm: Atrial fibrillation (04/19 0051) Resp:  [18-20] 20 (04/19 0217) BP: (116-155)/(68-96) 118/85 (04/19 0217) SpO2:  [94 %-97 %] 94 % (04/19 0217)  Pre op weight 79 kg Current Weight  06/06/16 78.4 kg (172 lb 12.8 oz)      Intake/Output from previous day: 04/18 0701 - 04/19 0700 In: 720 [P.O.:720] Out: 5001 [Urine:5000; Stool:1]   Physical Exam:  Cardiovascular: RRR Pulmonary: Slightly diminished at bases Abdomen: Soft, non tender, bowel sounds present. Extremities: Bilateral lower extremity edema.  Wounds: Clean and dry.  No erythema or signs of infection. Lower sternal wound with sero sanguinous oozing. No surrounding erythema.  Lab Results: CBC:No results for input(s): WBC, HGB, HCT, PLT in the last 72 hours. BMET:   Recent Labs  06/05/16 0214 06/06/16 0859  NA 132* 132*  K 3.3* 4.3  CL 93* 92*  CO2 30 29  GLUCOSE 125* 180*  BUN 9 6  CREATININE 0.74 0.88  CALCIUM 8.3* 9.1    PT/INR:  Lab Results  Component Value Date   INR 1.28 05/30/2016   INR 1.07 05/30/2016   INR 1.06 05/25/2016   ABG:  INR: Will add last result for INR, ABG once components are confirmed Will add last 4 CBG results once components are confirmed  Assessment/Plan:  1. CV - PAF (had a fib with RVR HR into 140's . SR this am in the 70's this am. On Amiodarone 400 mg bid, Lopressor 37.5 mg bid, Norvasc 5 mg daily, and Maxzide 37.5/25 mg daily.  May need to increase Lopressor.  Has LA clip but with PAF, will discuss with Dr. Dorris Fetch about anticoagulation. Had facial swelling and seen in ED so must avoid ACE. 2.  Pulmonary - On room air.  Encourage incentive spirometer. 3. Volume Overload - On Lasix 40 mg daily 4.  Acute blood loss anemia - Last H and H  stable at 10.2 and 32.5 5. Hyponatremia-sodium yesterday remained 132. Likely related to diuresis. 6. Hypothyroidism-TSH drawn in April 2.748. Continue Levothyroxine. 7. Regarding lower sternal drainage, no need for antibiotic at this time. Continue daily dressing changes and PRN. 8. Will need SNF when ready for discharge   ZIMMERMAN,DONIELLE MPA-C 06/07/2016,7:33 AM Patient seen and examined, agree with above Since she had a fib preop and continues to have it postop I think it would be safest to put her on apixaban. Can be held without issue to have back surgery  Viviann Spare C. Dorris Fetch, MD [facility] [phone]

## 2016-06-07 NOTE — Progress Notes (Signed)
Notified by Telemetry rhythm change from sinus to atrial fib with rapid ventricular rate, 120s to 140s. Dr. Dorris Fetch called and orders given will continue to monitor.

## 2016-06-07 NOTE — Progress Notes (Signed)
CSW spoke with facility([facility])  this morning and Marcelino Duster stated they have authorization and bed available for patient. Awaiting discharge order from MD once patient is medically ready.   Marrianne Mood, MSW,  [employer] [phone]
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
## 2016-06-07 NOTE — Progress Notes (Signed)
At 3:00 am patient self converted to sinus rhythm . I will hold intervention and continue to monitor.

## 2016-06-07 NOTE — Care Management Important Message (Signed)
Important Message  Patient Details  Name: Erin [name] MRN: [id number] Date of Birth: 19-Jul-1945   Medicare Important Message Given:  Yes    [name] 06/07/2016, 12:08 PM

## 2016-06-07 NOTE — Progress Notes (Addendum)
ANTICOAGULATION CONSULT NOTE  Pharmacy Consult for apixaban Indication: atrial fibrillation   Assessment: 70 yof with new-onset PAF - chads2vasc 4. Back surgery scheduled for 4/11 cancelled for now as patient required CABG. Pharmacy consulted to start apixaban. Not currently on any anticoagulation PTA or inpatient. SCr stable wnl, CBC stable on 4/16. No bleed documented.  Goal of Therapy:  Stroke prevention Monitor platelets by anticoagulation protocol: Yes   Plan:  Apixaban 5mg  PO BID Monitor CBC, s/sx bleeding F/u plans for back surgery, hold apixaban as appropriate   Babs Bertin, PharmD, BCPS Clinical Pharmacist 06/07/2016 11:27 AM

## 2016-06-07 NOTE — Progress Notes (Addendum)
Progress Note  Patient Name: Erin [name] Date of Encounter: 06/07/2016  Primary Cardiologist: New   Subjective   Breathing better  Mild soreness chest    Inpatient Medications    Scheduled Meds: . amiodarone  400 mg Oral BID  . amLODipine  5 mg Oral Daily  . aspirin EC  81 mg Oral Daily  . atorvastatin  80 mg Oral q1800  . bisacodyl  10 mg Oral Daily   Or  . bisacodyl  10 mg Rectal Daily  . cycloSPORINE  1 drop Both Eyes BID  . docusate sodium  200 mg Oral Daily  . furosemide  40 mg Oral Daily  . levothyroxine  112 mcg Oral QAC breakfast  . magnesium oxide  200 mg Oral QPC breakfast  . metoprolol tartrate  37.5 mg Oral BID  . multivitamin with minerals  1 tablet Oral Daily  . pantoprazole  40 mg Oral Daily  . sodium chloride flush  3 mL Intravenous Q12H  . triamterene-hydrochlorothiazide  1 tablet Oral Daily   Continuous Infusions: . sodium chloride     PRN Meds: sodium chloride, alum & mag hydroxide-simeth, guaiFENesin-dextromethorphan, magnesium hydroxide, morphine injection, ondansetron (ZOFRAN) IV, oxyCODONE, sodium chloride flush, traMADol, zolpidem   Vital Signs    Vitals:   06/06/16 1530 06/06/16 1545 06/06/16 2016 06/07/16 0217  BP: 116/78 131/68 132/79 118/85  Pulse: 74 76 86 (!) 124  Resp:   18 20  Temp:   98.6 F (37 C) 98.3 F (36.8 C)  TempSrc:   Oral Oral  SpO2:   96% 94%  Weight:      Height:        Intake/Output Summary (Last 24 hours) at 06/07/16 0744 Last data filed at 06/07/16 0650  Gross per 24 hour  Intake              720 ml  Output             5001 ml  Net            -4281 ml   Net 2.35 Positive   Filed Weights   06/04/16 0540 06/05/16 0504 06/06/16 0500  Weight: 186 lb 11.7 oz (84.7 kg) 184 lb (83.5 kg) 172 lb 12.8 oz (78.4 kg)    Telemetry      Atrial fib last evening  Now SR    - Personally Reviewed  ECG      Physical Exam   GEN: No acute distress.   Neck:  Neck full   Cardiac:  RRR    No S3  , no murmurs,  rubs, or gallops.  Respiratory: REl clear     GI: Soft, nontender, non-distended  MS:Tr   edema; No deformity. Neuro:  Nonfocal  Psych: Normal affect   Labs    Chemistry  Recent Labs Lab 06/04/16 0203 06/05/16 0214 06/06/16 0859  NA 129* 132* 132*  K 3.3* 3.3* 4.3  CL 91* 93* 92*  CO2 28 30 29   GLUCOSE 105* 125* 180*  BUN 12 9 6   CREATININE 0.67 0.74 0.88  CALCIUM 8.2* 8.3* 9.1  GFRNONAA >60 >60 >60  GFRAA >60 >60 >60  ANIONGAP 10 9 11      Hematology  Recent Labs Lab 06/02/16 0140 06/03/16 0250 06/04/16 0203  WBC 11.1* 9.5 7.0  RBC 3.73* 3.73* 3.85*  HGB 10.0* 9.9* 10.2*  HCT 32.1* 31.8* 32.5*  MCV 86.1 85.3 84.4  MCH 26.8 26.5 26.5  MCHC 31.2 31.1 31.4  RDW 16.4* 16.4* 16.2*  PLT 112* 133* 192    Cardiac EnzymesNo results for input(s): TROPONINI in the last 168 hours. No results for input(s): TROPIPOC in the last 168 hours.   BNPNo results for input(s): BNP, PROBNP in the last 168 hours.   DDimer No results for input(s): DDIMER in the last 168 hours.   Radiology    Dg Chest 2 View  Result Date: 06/05/2016 CLINICAL DATA:  Pneumothorax.  CABG. EXAM: CHEST  2 VIEW COMPARISON:  06/03/2016. FINDINGS: Cardiac enlargement persists. Lung volumes are increased. LEFT atrial appendage clip noted. LEFT effusion is unchanged. Mild LEFT basilar subsegmental atelectasis. No pneumothorax. Cervical fusion. IMPRESSION: Improved aeration.  No pneumothorax. Electronically Signed   By: Elsie Stain M.D.   On: 06/05/2016 09:11    Cardiac Studies     Patient Profile     71 y.o. female s/;p CABG with atrial clipping  Now Post Op with Atrial fib   Assessment & Plan    1  PAF  CHADSVASC 4  Pt  Had episode of afib last night  Back in SR  Continue amio 400 bid for 1 wk then 200 bid  Will address as outpt    Pt had clipping Not on anticoagulation    2  CAD Post op Day # 8CABG and L atrial appendage clipping  Pt has diuresed and looks better  Continue oral lasix as outpt      3  HTN  BP is better  4  HL  Continue statin  Will make sure she has appt in clinic  Dietrich Pates             Signed, Dietrich Pates, MD  06/07/2016, 7:44 AM

## 2016-06-07 NOTE — Progress Notes (Signed)
CARDIAC REHAB PHASE I   PRE:  Rate/Rhythm: 80 SR  BP:  Sitting: 141/75        SaO2: 96 RA  MODE:  Ambulation: 290 ft   POST:  Rate/Rhythm: 88 SR  BP:  Sitting: 133/84         SaO2: 98 RA  Pt ambulated 290 ft on RA, rolling walker, assist x1, slow, steady gait, tolerated fairly well with no specific complaints. Pt for discharge to SNF today. Cardiac surgery discharge education completed. Reviewed IS, sternal precautions, activity progression, exercise, heart healthy diet, daily weights, off the beat book, anticoagulation, and phase 2 cardiac rehab. Pt verbalized understanding. Pt agrees to phase 2 cardiac rehab referral, will send to [facility] per pt request. Pt to recliner after walk, feet elevated, call bell within reach.   [phone] Joylene Grapes, RN, BSN 06/07/2016 11:42 AM

## 2016-06-07 NOTE — Clinical Social Work Placement (Addendum)
   CLINICAL SOCIAL WORK PLACEMENT  NOTE  Date:  06/07/2016  Patient Details  Name: MAEBELLE DIMMICK MRN: [id number] Date of Birth: [birth year]  Clinical Social Work is seeking post-discharge placement for this patient at the Skilled  Nursing Facility level of care (*CSW will initial, date and re-position this form in  chart as items are completed):  Yes   Patient/family provided with [facility] Clinical Social Work Department's list of facilities offering this level of care within the geographic area requested by the patient (or if unable, by the patient's family).  Yes   Patient/family informed of their freedom to choose among providers that offer the needed level of care, that participate in Medicare, Medicaid or managed care program needed by the patient, have an available bed and are willing to accept the patient.  Yes   Patient/family informed of [facility]'s ownership interest in [facility] and [facility], as well as of the fact that they are under no obligation to receive care at these facilities.  PASRR submitted to EDS on 06/07/16     PASRR number received on 06/07/16     Existing PASRR number confirmed on       FL2 transmitted to all facilities in geographic area requested by pt/family on 06/07/16     FL2 transmitted to all facilities within larger geographic area on       Patient informed that his/her managed care company has contracts with or will negotiate with certain facilities, including the following:        Yes   Patient/family informed of bed offers received.  Patient chooses bed at [facility]     Physician recommends and patient chooses bed at      Patient to be transferred to [facility] on 06/07/16.  Patient to be transferred to facility by [facility]     Patient family notified on 06/07/16 of transfer.  Name of family member notified:  [employer]     PHYSICIAN       Additional Comment:  Pt is ready for discharge today and will go to  [facility]. Pt and daughter (at bedside) are aware and agreeable to discharge plan. CSW sent clinicals to [facility] and communicated with [facility] (admissions) to make aware of d/c today. Insurance auth rec'd by facility. Room and report put in treatment team sticky note and RN aware. Transportation arranged with [facility] for 2pm pick up, as requested by RN. Med Transportation form on chart. CSW is signing off as no further needs identified. _______________________________________________ Dominic Pea, LCSW 06/07/2016, 1:01 PM

## 2016-06-07 NOTE — Progress Notes (Signed)
Report called to [facility]. All questions answered.

## 2016-06-07 NOTE — Discharge Instructions (Signed)
We ask the SNF to please do the following: °1. Please obtain vital signs at least one time daily °2.Please weigh the patient daily. If he or she continues to gain weight or develops lower extremity edema, contact the office at [phone]. °3. Ambulate patient at least three times daily and please use sternal precautions. ° °Coronary Artery Bypass Grafting, Care After °This sheet gives you information about how to care for yourself after your procedure. Your health care provider may also give you more specific instructions. If you have problems or questions, contact your health care provider. °What can I expect after the procedure? °After the procedure, it is common to have: °· Nausea and a lack of appetite. °· Constipation. °· Weakness and fatigue. °· Depression or irritability. °· Pain or discomfort in your incision areas. °Follow these instructions at home: °Medicines  °· Take over-the-counter and prescription medicines only as told by your health care provider. Do not stop taking medicines or start any new medicines without approval from your health care provider. °· If you were prescribed an antibiotic medicine, take it as told by your health care provider. Do not stop taking the antibiotic even if you start to feel better. °· Do not drive or use heavy machinery while taking prescription pain medicine. °Incision care  °· Follow instructions from your health care provider about how to take care of your incisions. Make sure you: °¨ Wash your hands with soap and water before you change your bandage (dressing). If soap and water are not available, use hand sanitizer. °¨ Change your dressing as told by your health care provider. °¨ Leave stitches (sutures), skin glue, or adhesive strips in place. These skin closures may need to stay in place for 2 weeks or longer. If adhesive strip edges start to loosen and curl up, you may trim the loose edges. Do not remove adhesive strips completely unless your health care  provider tells you to do that. °· Keep incision areas clean, dry, and protected. °· Check your incision areas every day for signs of infection. Check for: °¨ More redness, swelling, or pain. °¨ More fluid or blood. °¨ Warmth. °¨ Pus or a bad smell. °· If incisions were made in your legs: °¨ Avoid crossing your legs. °¨ Avoid sitting for long periods of time. Change positions every 30 minutes. °¨ Raise (elevate) your legs when you are sitting. °Bathing  °· Do not take baths, swim, or use a hot tub until your health care provider approves. °· Only take sponge baths. Pat the incisions dry. Do not rub incisions with a washcloth or towel. °· Ask your health care provider when you can shower. °Eating and drinking  °· Eat foods that are high in fiber, such as raw fruits and vegetables, whole grains, beans, and nuts. Meats should be lean cut. Avoid canned, processed, and fried foods. This can help prevent constipation and is a recommended part of a heart-healthy diet. °· Drink enough fluid to keep your urine clear or pale yellow. °· Limit alcohol intake to no more than 1 drink a day for nonpregnant women and 2 drinks a day for men. One drink equals 12 oz of beer, 5 oz of wine, or 1½ oz of hard liquor. °Activity  °· Rest and limit your activity as told by your health care provider. You may be instructed to: °¨ Stop any activity right away if you have chest pain, shortness of breath, irregular heartbeats, or dizziness. Get help right away if you   have any of these symptoms.  Move around frequently for short periods or take short walks as directed by your health care provider. Gradually increase your activities. You may need physical therapy or cardiac rehabilitation to help strengthen your muscles and build your endurance.  Avoid lifting, pushing, or pulling anything that is heavier than 10 lb (4.5 kg) for at least 6 weeks or as told by your health care provider.  Do not drive until your health care provider  approves.  Ask your health care provider when you may return to work.  Ask your health care provider when you may resume sexual activity. General instructions   Do not use any products that contain nicotine or tobacco, such as cigarettes and e-cigarettes. If you need help quitting, ask your health care provider.  Take 2-3 deep breaths every few hours during the day, while you recover. This helps expand your lungs and prevent complications like pneumonia after surgery.  If you were given a device called an incentive spirometer, use it several times a day to practice deep breathing. Support your chest with a pillow or your arms when you take deep breaths or cough.  Wear compression stockings as told by your health care provider. These stockings help to prevent blood clots and reduce swelling in your legs.  Weigh yourself every day. This helps identify if your body is holding (retaining) fluid that may make your heart and lungs work harder.  Keep all follow-up visits as told by your health care provider. This is important. Contact a health care provider if:  You have more redness, swelling, or pain around any incision.  You have more fluid or blood coming from any incision.  Any incision feels warm to the touch.  You have pus or a bad smell coming from any incision  You have a fever.  You have swelling in your ankles or legs.  You have pain in your legs.  You gain 2 lb (0.9 kg) or more a day.  You are nauseous or you vomit.  You have diarrhea. Get help right away if:  You have chest pain that spreads to your jaw or arms.  You are short of breath.  You have a fast or irregular heartbeat.  You notice a "clicking" in your breastbone (sternum) when you move.  You have numbness or weakness in your arms or legs.  You feel dizzy or light-headed. Summary  After the procedure, it is common to have pain or discomfort in the incision areas.  Do not take baths, swim, or use a  hot tub until your health care provider approves.  Gradually increase your activities. You may need physical therapy or cardiac rehabilitation to help strengthen your muscles and build your endurance.  Weigh yourself every day. This helps identify if your body is holding (retaining) fluid that may make your heart and lungs work harder. This information is not intended to replace advice given to you by your health care provider. Make sure you discuss any questions you have with your health care provider. Document Released: 08/25/2004 Document Revised: 12/26/2015 Document Reviewed: 12/26/2015 Elsevier Interactive Patient Education  2017 [employer].  Information on my medicine - ELIQUIS (apixaban)  This medication education was reviewed with me or my healthcare representative as part of my discharge preparation.  Why was Eliquis prescribed for you? Eliquis was prescribed for you to reduce the risk of a blood clot forming that can cause a stroke if you have a medical condition called atrial  fibrillation (a type of irregular heartbeat).  What do You need to know about Eliquis ? Take your Eliquis TWICE DAILY - one tablet in the morning and one tablet in the evening with or without food. If you have difficulty swallowing the tablet whole please discuss with your pharmacist how to take the medication safely.  Take Eliquis exactly as prescribed by your doctor and DO NOT stop taking Eliquis without talking to the doctor who prescribed the medication.  Stopping may increase your risk of developing a stroke.  Refill your prescription before you run out.  After discharge, you should have regular check-up appointments with your healthcare provider that is prescribing your Eliquis.  In the future your dose may need to be changed if your kidney function or weight changes by a significant amount or as you get older.  What do you do if you miss a dose? If you miss a dose, take it as soon as you  remember on the same day and resume taking twice daily.  Do not take more than one dose of ELIQUIS at the same time to make up a missed dose.  Important Safety Information A possible side effect of Eliquis is bleeding. You should call your healthcare provider right away if you experience any of the following: ? Bleeding from an injury or your nose that does not stop. ? Unusual colored urine (red or dark [place]) or unusual colored stools (red or black). ? Unusual bruising for unknown reasons. ? A serious fall or if you hit your head (even if there is no bleeding).  Some medicines may interact with Eliquis and might increase your risk of bleeding or clotting while on Eliquis. To help avoid this, consult your healthcare provider or pharmacist prior to using any new prescription or non-prescription medications, including herbals, vitamins, non-steroidal anti-inflammatory drugs (NSAIDs) and supplements.  This website has more information on Eliquis (apixaban): http://www.eliquis.com/eliquis/home

## 2016-06-14 ENCOUNTER — Encounter (INDEPENDENT_AMBULATORY_CARE_PROVIDER_SITE_OTHER): Payer: Self-pay

## 2016-06-14 DIAGNOSIS — Z4802 Encounter for removal of sutures: Secondary | ICD-10-CM
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
## 2016-06-20 ENCOUNTER — Ambulatory Visit (INDEPENDENT_AMBULATORY_CARE_PROVIDER_SITE_OTHER): Payer: Medicare HMO | Admitting: Cardiology

## 2016-06-20 ENCOUNTER — Encounter: Payer: Self-pay | Admitting: Cardiology

## 2016-06-20 ENCOUNTER — Ambulatory Visit
Admission: RE | Admit: 2016-06-20 | Discharge: 2016-06-20 | Disposition: A | Discharge status: Home / Self Care | Payer: Medicare HMO | Source: Ambulatory Visit | Attending: Cardiology | Admitting: Cardiology

## 2016-06-20 VITALS — BP 124/84 | HR 75 | Temp 98.5°F | Ht 63.0 in | Wt 173.5 lb

## 2016-06-20 DIAGNOSIS — Z9229 Personal history of other drug therapy: Secondary | ICD-10-CM

## 2016-06-20 DIAGNOSIS — Z951 Presence of aortocoronary bypass graft: Secondary | ICD-10-CM | POA: Diagnosis not present

## 2016-06-20 DIAGNOSIS — R509 Fever, unspecified: Secondary | ICD-10-CM

## 2016-06-20 DIAGNOSIS — I2510 Atherosclerotic heart disease of native coronary artery without angina pectoris: Secondary | ICD-10-CM

## 2016-06-20 DIAGNOSIS — I4891 Unspecified atrial fibrillation: Secondary | ICD-10-CM | POA: Diagnosis not present

## 2016-06-20 DIAGNOSIS — E876 Hypokalemia: Secondary | ICD-10-CM

## 2016-06-20 LAB — CBC WITH DIFFERENTIAL/PLATELET
Basophils Absolute: 0 10*3/uL (ref 0.0–0.2)
Basos: 0 %
EOS (ABSOLUTE): 0 10*3/uL (ref 0.0–0.4)
Eos: 0 %
Hematocrit: 29 % — ABNORMAL LOW (ref 34.0–46.6)
Hemoglobin: 9.9 g/dL — ABNORMAL LOW (ref 11.1–15.9)
Immature Grans (Abs): 0.1 10*3/uL (ref 0.0–0.1)
Immature Granulocytes: 1 %
Lymphocytes Absolute: 0.5 10*3/uL — ABNORMAL LOW (ref 0.7–3.1)
Lymphs: 3 %
MCH: 27 pg (ref 26.6–33.0)
MCHC: 34.1 g/dL (ref 31.5–35.7)
MCV: 79 fL (ref 79–97)
Monocytes Absolute: 1.3 10*3/uL — ABNORMAL HIGH (ref 0.1–0.9)
Monocytes: 7 %
Neutrophils Absolute: 17.1 10*3/uL — ABNORMAL HIGH (ref 1.4–7.0)
Neutrophils: 89 %
Platelets: 147 10*3/uL — ABNORMAL LOW (ref 150–379)
RBC: 3.67 x10E6/uL — ABNORMAL LOW (ref 3.77–5.28)
RDW: 17.5 % — ABNORMAL HIGH (ref 12.3–15.4)
WBC: 19 10*3/uL — ABNORMAL HIGH (ref 3.4–10.8)

## 2016-06-20 LAB — TSH: TSH: 16.57 u[IU]/mL — ABNORMAL HIGH (ref 0.450–4.500)

## 2016-06-20 LAB — BASIC METABOLIC PANEL
BUN/Creatinine Ratio: 16 (ref 12–28)
BUN: 29 mg/dL — ABNORMAL HIGH (ref 8–27)
CO2: 25 mmol/L (ref 18–29)
Calcium: 8.5 mg/dL — ABNORMAL LOW (ref 8.7–10.3)
Chloride: 81 mmol/L — ABNORMAL LOW (ref 96–106)
Creatinine, Ser: 1.83 mg/dL — ABNORMAL HIGH (ref 0.57–1.00)
GFR calc Af Amer: 31 mL/min/{1.73_m2} — ABNORMAL LOW (ref >59)
GFR calc non Af Amer: 27 mL/min/{1.73_m2} — ABNORMAL LOW (ref >59)
Glucose: 146 mg/dL — ABNORMAL HIGH (ref 65–99)
Potassium: 3.3 mmol/L — ABNORMAL LOW (ref 3.5–5.2)
Sodium: 126 mmol/L — ABNORMAL LOW (ref 134–144)

## 2016-06-20 LAB — MAGNESIUM: Magnesium: 1.6 mg/dL (ref 1.6–2.3)

## 2016-06-20 LAB — HEPATIC FUNCTION PANEL
ALT: 18 IU/L (ref 0–32)
AST: 30 IU/L (ref 0–40)
Albumin: 3.1 g/dL — ABNORMAL LOW (ref 3.5–4.8)
Alkaline Phosphatase: 112 IU/L (ref 39–117)
Bilirubin Total: 0.9 mg/dL (ref 0.0–1.2)
Bilirubin, Direct: 0.46 mg/dL — ABNORMAL HIGH (ref 0.00–0.40)
Total Protein: 5.3 g/dL — ABNORMAL LOW (ref 6.0–8.5)

## 2016-06-20 MED ORDER — METOPROLOL TARTRATE 50 MG PO TABS: 50.0000 mg | ORAL_TABLET | Freq: Two times a day (BID) | ORAL | 3 refills | Status: DC

## 2016-06-20 NOTE — Patient Instructions (Addendum)
Medication Instructions:   START TAKING METOPROLOL  50 MG TWICE  A DAY    If you need a refill on your cardiac medications before your next appointment, please call your pharmacy.  Labwork: CBC/W DIFF BMP TSH MAG  U/A AND URINE CULTURE   Testing/Procedures: A chest x-ray takes a picture of the organs and structures inside the chest, including the heart, lungs, and blood vessels. This test can show several things, including, whether the heart is enlarges; whether fluid is building up in the lungs; and whether pacemaker / defibrillator leads are still in place.    Follow-Up: WITH 2 MONTHS DR Tresa Endo    Any Other Special Instructions Will Be Listed Below (If Applicable).                                                                                                                                                 ]

## 2016-06-21 ENCOUNTER — Inpatient Hospital Stay (HOSPITAL_COMMUNITY): Payer: Medicare HMO

## 2016-06-21 ENCOUNTER — Telehealth: Payer: Self-pay | Admitting: Cardiology

## 2016-06-21 ENCOUNTER — Ambulatory Visit (INDEPENDENT_AMBULATORY_CARE_PROVIDER_SITE_OTHER): Payer: Self-pay | Admitting: Surgical

## 2016-06-21 ENCOUNTER — Inpatient Hospital Stay (HOSPITAL_COMMUNITY)
Admission: AD | Admit: 2016-06-21 | Discharge: 2016-08-01 | DRG: 853 | Disposition: A | Discharge status: Skilled Nursing Facility | Payer: Medicare HMO | Source: Ambulatory Visit | Attending: Internal Medicine | Admitting: Internal Medicine

## 2016-06-21 VITALS — BP 126/67 | HR 81 | Temp 101.3°F | Resp 16 | Ht 63.0 in | Wt 173.0 lb

## 2016-06-21 DIAGNOSIS — M545 Low back pain, unspecified: Secondary | ICD-10-CM

## 2016-06-21 DIAGNOSIS — K219 Gastro-esophageal reflux disease without esophagitis: Secondary | ICD-10-CM | POA: Diagnosis present

## 2016-06-21 DIAGNOSIS — E785 Hyperlipidemia, unspecified: Secondary | ICD-10-CM | POA: Diagnosis present

## 2016-06-21 DIAGNOSIS — T40605A Adverse effect of unspecified narcotics, initial encounter: Secondary | ICD-10-CM | POA: Diagnosis not present

## 2016-06-21 DIAGNOSIS — M4626 Osteomyelitis of vertebra, lumbar region: Secondary | ICD-10-CM | POA: Diagnosis present

## 2016-06-21 DIAGNOSIS — R7989 Other specified abnormal findings of blood chemistry: Secondary | ICD-10-CM

## 2016-06-21 DIAGNOSIS — G062 Extradural and subdural abscess, unspecified: Secondary | ICD-10-CM | POA: Diagnosis not present

## 2016-06-21 DIAGNOSIS — Z951 Presence of aortocoronary bypass graft: Secondary | ICD-10-CM | POA: Diagnosis not present

## 2016-06-21 DIAGNOSIS — Z7901 Long term (current) use of anticoagulants: Secondary | ICD-10-CM

## 2016-06-21 DIAGNOSIS — Z8249 Family history of ischemic heart disease and other diseases of the circulatory system: Secondary | ICD-10-CM | POA: Diagnosis not present

## 2016-06-21 DIAGNOSIS — G8918 Other acute postprocedural pain: Secondary | ICD-10-CM | POA: Diagnosis not present

## 2016-06-21 DIAGNOSIS — D696 Thrombocytopenia, unspecified: Secondary | ICD-10-CM | POA: Diagnosis not present

## 2016-06-21 DIAGNOSIS — N12 Tubulo-interstitial nephritis, not specified as acute or chronic: Secondary | ICD-10-CM

## 2016-06-21 DIAGNOSIS — M4646 Discitis, unspecified, lumbar region: Secondary | ICD-10-CM | POA: Diagnosis not present

## 2016-06-21 DIAGNOSIS — I480 Paroxysmal atrial fibrillation: Secondary | ICD-10-CM

## 2016-06-21 DIAGNOSIS — I5033 Acute on chronic diastolic (congestive) heart failure: Secondary | ICD-10-CM | POA: Diagnosis present

## 2016-06-21 DIAGNOSIS — I110 Hypertensive heart disease with heart failure: Secondary | ICD-10-CM | POA: Diagnosis present

## 2016-06-21 DIAGNOSIS — Z885 Allergy status to narcotic agent status: Secondary | ICD-10-CM | POA: Diagnosis not present

## 2016-06-21 DIAGNOSIS — R0600 Dyspnea, unspecified: Secondary | ICD-10-CM

## 2016-06-21 DIAGNOSIS — M4620 Osteomyelitis of vertebra, site unspecified: Secondary | ICD-10-CM

## 2016-06-21 DIAGNOSIS — M4640 Discitis, unspecified, site unspecified: Secondary | ICD-10-CM | POA: Diagnosis present

## 2016-06-21 DIAGNOSIS — I4892 Unspecified atrial flutter: Secondary | ICD-10-CM | POA: Diagnosis not present

## 2016-06-21 DIAGNOSIS — Z978 Presence of other specified devices: Secondary | ICD-10-CM | POA: Diagnosis not present

## 2016-06-21 DIAGNOSIS — D638 Anemia in other chronic diseases classified elsewhere: Secondary | ICD-10-CM | POA: Diagnosis present

## 2016-06-21 DIAGNOSIS — R410 Disorientation, unspecified: Secondary | ICD-10-CM | POA: Diagnosis not present

## 2016-06-21 DIAGNOSIS — G061 Intraspinal abscess and granuloma: Secondary | ICD-10-CM | POA: Diagnosis present

## 2016-06-21 DIAGNOSIS — D62 Acute posthemorrhagic anemia: Secondary | ICD-10-CM

## 2016-06-21 DIAGNOSIS — I340 Nonrheumatic mitral (valve) insufficiency: Secondary | ICD-10-CM | POA: Diagnosis not present

## 2016-06-21 DIAGNOSIS — I248 Other forms of acute ischemic heart disease: Secondary | ICD-10-CM | POA: Diagnosis not present

## 2016-06-21 DIAGNOSIS — M170 Bilateral primary osteoarthritis of knee: Secondary | ICD-10-CM | POA: Diagnosis present

## 2016-06-21 DIAGNOSIS — Z888 Allergy status to other drugs, medicaments and biological substances status: Secondary | ICD-10-CM | POA: Diagnosis not present

## 2016-06-21 DIAGNOSIS — N390 Urinary tract infection, site not specified: Secondary | ICD-10-CM | POA: Diagnosis present

## 2016-06-21 DIAGNOSIS — G43909 Migraine, unspecified, not intractable, without status migrainosus: Secondary | ICD-10-CM | POA: Diagnosis present

## 2016-06-21 DIAGNOSIS — A419 Sepsis, unspecified organism: Secondary | ICD-10-CM

## 2016-06-21 DIAGNOSIS — R001 Bradycardia, unspecified: Secondary | ICD-10-CM | POA: Diagnosis not present

## 2016-06-21 DIAGNOSIS — A4151 Sepsis due to Escherichia coli [E. coli]: Principal | ICD-10-CM | POA: Diagnosis present

## 2016-06-21 DIAGNOSIS — M009 Pyogenic arthritis, unspecified: Secondary | ICD-10-CM | POA: Diagnosis present

## 2016-06-21 DIAGNOSIS — M48061 Spinal stenosis, lumbar region without neurogenic claudication: Secondary | ICD-10-CM | POA: Diagnosis not present

## 2016-06-21 DIAGNOSIS — I4891 Unspecified atrial fibrillation: Secondary | ICD-10-CM | POA: Diagnosis not present

## 2016-06-21 DIAGNOSIS — I2510 Atherosclerotic heart disease of native coronary artery without angina pectoris: Secondary | ICD-10-CM | POA: Diagnosis present

## 2016-06-21 DIAGNOSIS — E7800 Pure hypercholesterolemia, unspecified: Secondary | ICD-10-CM | POA: Diagnosis present

## 2016-06-21 DIAGNOSIS — G4730 Sleep apnea, unspecified: Secondary | ICD-10-CM | POA: Diagnosis present

## 2016-06-21 DIAGNOSIS — E860 Dehydration: Secondary | ICD-10-CM | POA: Diagnosis present

## 2016-06-21 DIAGNOSIS — Z419 Encounter for procedure for purposes other than remedying health state, unspecified: Secondary | ICD-10-CM

## 2016-06-21 DIAGNOSIS — M479 Spondylosis, unspecified: Secondary | ICD-10-CM | POA: Diagnosis present

## 2016-06-21 DIAGNOSIS — E871 Hypo-osmolality and hyponatremia: Secondary | ICD-10-CM | POA: Diagnosis present

## 2016-06-21 DIAGNOSIS — E875 Hyperkalemia: Secondary | ICD-10-CM | POA: Diagnosis not present

## 2016-06-21 DIAGNOSIS — T84038A Mechanical loosening of other internal prosthetic joint, initial encounter: Secondary | ICD-10-CM | POA: Diagnosis present

## 2016-06-21 DIAGNOSIS — E039 Hypothyroidism, unspecified: Secondary | ICD-10-CM | POA: Diagnosis present

## 2016-06-21 DIAGNOSIS — I484 Atypical atrial flutter: Secondary | ICD-10-CM | POA: Diagnosis not present

## 2016-06-21 DIAGNOSIS — G92 Toxic encephalopathy: Secondary | ICD-10-CM | POA: Diagnosis not present

## 2016-06-21 DIAGNOSIS — B001 Herpesviral vesicular dermatitis: Secondary | ICD-10-CM | POA: Diagnosis not present

## 2016-06-21 DIAGNOSIS — R8271 Bacteriuria: Secondary | ICD-10-CM | POA: Diagnosis not present

## 2016-06-21 DIAGNOSIS — M549 Dorsalgia, unspecified: Secondary | ICD-10-CM

## 2016-06-21 DIAGNOSIS — N179 Acute kidney failure, unspecified: Secondary | ICD-10-CM | POA: Diagnosis present

## 2016-06-21 DIAGNOSIS — Z981 Arthrodesis status: Secondary | ICD-10-CM | POA: Diagnosis not present

## 2016-06-21 DIAGNOSIS — R509 Fever, unspecified: Secondary | ICD-10-CM | POA: Diagnosis present

## 2016-06-21 DIAGNOSIS — E876 Hypokalemia: Secondary | ICD-10-CM | POA: Diagnosis not present

## 2016-06-21 DIAGNOSIS — R748 Abnormal levels of other serum enzymes: Secondary | ICD-10-CM | POA: Diagnosis not present

## 2016-06-21 DIAGNOSIS — Z9889 Other specified postprocedural states: Secondary | ICD-10-CM | POA: Diagnosis not present

## 2016-06-21 DIAGNOSIS — J42 Unspecified chronic bronchitis: Secondary | ICD-10-CM | POA: Diagnosis present

## 2016-06-21 DIAGNOSIS — R140 Abdominal distension (gaseous): Secondary | ICD-10-CM | POA: Diagnosis not present

## 2016-06-21 DIAGNOSIS — Z09 Encounter for follow-up examination after completed treatment for conditions other than malignant neoplasm: Secondary | ICD-10-CM

## 2016-06-21 DIAGNOSIS — Z9689 Presence of other specified functional implants: Secondary | ICD-10-CM | POA: Diagnosis not present

## 2016-06-21 DIAGNOSIS — R0602 Shortness of breath: Secondary | ICD-10-CM

## 2016-06-21 DIAGNOSIS — I4819 Other persistent atrial fibrillation: Secondary | ICD-10-CM | POA: Diagnosis present

## 2016-06-21 DIAGNOSIS — R946 Abnormal results of thyroid function studies: Secondary | ICD-10-CM | POA: Diagnosis not present

## 2016-06-21 DIAGNOSIS — T462X5A Adverse effect of other antidysrhythmic drugs, initial encounter: Secondary | ICD-10-CM | POA: Diagnosis not present

## 2016-06-21 DIAGNOSIS — I481 Persistent atrial fibrillation: Secondary | ICD-10-CM | POA: Diagnosis not present

## 2016-06-21 DIAGNOSIS — R58 Hemorrhage, not elsewhere classified: Secondary | ICD-10-CM

## 2016-06-21 DIAGNOSIS — K5900 Constipation, unspecified: Secondary | ICD-10-CM | POA: Diagnosis not present

## 2016-06-21 DIAGNOSIS — D649 Anemia, unspecified: Secondary | ICD-10-CM

## 2016-06-21 DIAGNOSIS — I255 Ischemic cardiomyopathy: Secondary | ICD-10-CM | POA: Diagnosis present

## 2016-06-21 DIAGNOSIS — I483 Typical atrial flutter: Secondary | ICD-10-CM | POA: Diagnosis not present

## 2016-06-21 DIAGNOSIS — T888XXA Other specified complications of surgical and medical care, not elsewhere classified, initial encounter: Secondary | ICD-10-CM

## 2016-06-21 DIAGNOSIS — T461X5A Adverse effect of calcium-channel blockers, initial encounter: Secondary | ICD-10-CM | POA: Diagnosis not present

## 2016-06-21 DIAGNOSIS — T888XXD Other specified complications of surgical and medical care, not elsewhere classified, subsequent encounter: Secondary | ICD-10-CM | POA: Diagnosis not present

## 2016-06-21 DIAGNOSIS — R7881 Bacteremia: Secondary | ICD-10-CM | POA: Diagnosis not present

## 2016-06-21 DIAGNOSIS — Z7982 Long term (current) use of aspirin: Secondary | ICD-10-CM

## 2016-06-21 HISTORY — DX: Atherosclerotic heart disease of native coronary artery without angina pectoris: I25.10

## 2016-06-21 LAB — CBC WITH DIFFERENTIAL/PLATELET
Basophils Absolute: 0 10*3/uL (ref 0.0–0.1)
Basophils Relative: 0 %
Eosinophils Absolute: 0 10*3/uL (ref 0.0–0.7)
Eosinophils Relative: 0 %
HCT: 26.3 % — ABNORMAL LOW (ref 36.0–46.0)
Hemoglobin: 8.6 g/dL — ABNORMAL LOW (ref 12.0–15.0)
Lymphocytes Relative: 3 %
Lymphs Abs: 0.3 10*3/uL — ABNORMAL LOW (ref 0.7–4.0)
MCH: 26.2 pg (ref 26.0–34.0)
MCHC: 32.7 g/dL (ref 30.0–36.0)
MCV: 80.2 fL (ref 78.0–100.0)
Monocytes Absolute: 0.8 10*3/uL (ref 0.1–1.0)
Monocytes Relative: 8 %
Neutro Abs: 9.1 10*3/uL — ABNORMAL HIGH (ref 1.7–7.7)
Neutrophils Relative %: 89 %
Platelets: 105 10*3/uL — ABNORMAL LOW (ref 150–400)
RBC: 3.28 MIL/uL — ABNORMAL LOW (ref 3.87–5.11)
RDW: 16.9 % — ABNORMAL HIGH (ref 11.5–15.5)
WBC: 10.2 10*3/uL (ref 4.0–10.5)

## 2016-06-21 LAB — APTT: aPTT: 42 seconds — ABNORMAL HIGH (ref 24–36)

## 2016-06-21 LAB — URINALYSIS, ROUTINE W REFLEX MICROSCOPIC
Bilirubin Urine: NEGATIVE
Glucose, UA: NEGATIVE mg/dL
Ketones, ur: NEGATIVE mg/dL
Nitrite: NEGATIVE
Protein, ur: 30 mg/dL — AB
Specific Gravity, Urine: 1.008 (ref 1.005–1.030)
Squamous Epithelial / LPF: NONE SEEN
pH: 6 (ref 5.0–8.0)

## 2016-06-21 LAB — COMPREHENSIVE METABOLIC PANEL
ALT: 19 U/L (ref 14–54)
AST: 31 U/L (ref 15–41)
Albumin: 2.1 g/dL — ABNORMAL LOW (ref 3.5–5.0)
Alkaline Phosphatase: 95 U/L (ref 38–126)
Anion gap: 11 (ref 5–15)
BUN: 33 mg/dL — ABNORMAL HIGH (ref 6–20)
CO2: 25 mmol/L (ref 22–32)
Calcium: 7.8 mg/dL — ABNORMAL LOW (ref 8.9–10.3)
Chloride: 89 mmol/L — ABNORMAL LOW (ref 101–111)
Creatinine, Ser: 1.6 mg/dL — ABNORMAL HIGH (ref 0.44–1.00)
GFR calc Af Amer: 36 mL/min — ABNORMAL LOW (ref >60)
GFR calc non Af Amer: 31 mL/min — ABNORMAL LOW (ref >60)
Glucose, Bld: 133 mg/dL — ABNORMAL HIGH (ref 65–99)
Potassium: 3.2 mmol/L — ABNORMAL LOW (ref 3.5–5.1)
Sodium: 125 mmol/L — ABNORMAL LOW (ref 135–145)
Total Bilirubin: 0.7 mg/dL (ref 0.3–1.2)
Total Protein: 5.2 g/dL — ABNORMAL LOW (ref 6.5–8.1)

## 2016-06-21 LAB — PROCALCITONIN: Procalcitonin: 72.01 ng/mL

## 2016-06-21 LAB — LACTIC ACID, PLASMA: Lactic Acid, Venous: 1.2 mmol/L (ref 0.5–1.9)

## 2016-06-21 LAB — PROTIME-INR
INR: 2.17
Prothrombin Time: 24.5 seconds — ABNORMAL HIGH (ref 11.4–15.2)

## 2016-06-21 LAB — T4, FREE: Free T4: 1.63 ng/dL — ABNORMAL HIGH (ref 0.61–1.12)

## 2016-06-21 MED ORDER — SODIUM CHLORIDE 0.9 % IV SOLN
1500.0000 mg | Freq: Once | INTRAVENOUS | Status: AC
  Administered 2016-06-21: 1500 mg via INTRAVENOUS
  Filled 2016-06-21: qty 1500

## 2016-06-21 MED ORDER — VITAMIN D 1000 UNITS PO TABS
2000.0000 [IU] | ORAL_TABLET | Freq: Every day | ORAL | Status: DC
  Administered 2016-06-22 – 2016-08-01 (×36): 2000 [IU] via ORAL
  Filled 2016-06-21 (×40): qty 2

## 2016-06-21 MED ORDER — ATORVASTATIN CALCIUM 80 MG PO TABS
80.0000 mg | ORAL_TABLET | Freq: Every day | ORAL | Status: DC
  Administered 2016-06-21 – 2016-07-31 (×38): 80 mg via ORAL
  Filled 2016-06-21 (×39): qty 1

## 2016-06-21 MED ORDER — SODIUM CHLORIDE 0.9 % IV SOLN
INTRAVENOUS | Status: AC
  Administered 2016-06-21: 19:00:00 via INTRAVENOUS

## 2016-06-21 MED ORDER — PIPERACILLIN-TAZOBACTAM 3.375 G IVPB 30 MIN
3.3750 g | Freq: Once | INTRAVENOUS | Status: AC
  Administered 2016-06-21: 3.375 g via INTRAVENOUS
  Filled 2016-06-21: qty 50

## 2016-06-21 MED ORDER — METOPROLOL TARTRATE 25 MG PO TABS
37.5000 mg | ORAL_TABLET | Freq: Two times a day (BID) | ORAL | Status: DC
  Administered 2016-06-21 – 2016-07-01 (×17): 37.5 mg via ORAL
  Filled 2016-06-21 (×20): qty 2

## 2016-06-21 MED ORDER — ACETAMINOPHEN 650 MG RE SUPP
650.0000 mg | Freq: Four times a day (QID) | RECTAL | Status: DC | PRN
  Administered 2016-06-22: 650 mg via RECTAL
  Filled 2016-06-21: qty 1

## 2016-06-21 MED ORDER — ONDANSETRON HCL 4 MG PO TABS
4.0000 mg | ORAL_TABLET | Freq: Four times a day (QID) | ORAL | Status: DC | PRN
  Administered 2016-06-27 – 2016-07-29 (×6): 4 mg via ORAL
  Filled 2016-06-21 (×6): qty 1

## 2016-06-21 MED ORDER — AMIODARONE HCL 200 MG PO TABS: 200.0000 mg | ORAL_TABLET | Freq: Every day | ORAL | Status: DC

## 2016-06-21 MED ORDER — BUTALBITAL-APAP-CAFFEINE 50-325-40 MG PO TABS: 1.0000 | ORAL_TABLET | Freq: Two times a day (BID) | ORAL | Status: DC | PRN

## 2016-06-21 MED ORDER — SODIUM CHLORIDE 0.9% FLUSH
3.0000 mL | Freq: Two times a day (BID) | INTRAVENOUS | Status: DC
  Administered 2016-06-22 – 2016-07-31 (×23): 3 mL via INTRAVENOUS

## 2016-06-21 MED ORDER — FUROSEMIDE 40 MG PO TABS: 40.0000 mg | ORAL_TABLET | ORAL | Status: DC

## 2016-06-21 MED ORDER — CYCLOSPORINE 0.05 % OP EMUL
1.0000 [drp] | Freq: Two times a day (BID) | OPHTHALMIC | Status: DC
  Administered 2016-06-21 – 2016-08-01 (×78): 1 [drp] via OPHTHALMIC
  Filled 2016-06-21 (×85): qty 1

## 2016-06-21 MED ORDER — APIXABAN 2.5 MG PO TABS
2.5000 mg | ORAL_TABLET | Freq: Two times a day (BID) | ORAL | Status: DC
  Administered 2016-06-21 – 2016-06-23 (×4): 2.5 mg via ORAL
  Filled 2016-06-21 (×5): qty 1

## 2016-06-21 MED ORDER — LEVOTHYROXINE SODIUM 112 MCG PO TABS
112.0000 ug | ORAL_TABLET | Freq: Every day | ORAL | Status: DC
  Administered 2016-06-22 – 2016-08-01 (×41): 112 ug via ORAL
  Filled 2016-06-21 (×42): qty 1

## 2016-06-21 MED ORDER — ENOXAPARIN SODIUM 40 MG/0.4ML ~~LOC~~ SOLN: 40.0000 mg | SUBCUTANEOUS | Status: DC

## 2016-06-21 MED ORDER — POTASSIUM CHLORIDE CRYS ER 20 MEQ PO TBCR
40.0000 meq | EXTENDED_RELEASE_TABLET | Freq: Once | ORAL | Status: AC
  Administered 2016-06-21: 40 meq via ORAL
  Filled 2016-06-21: qty 2

## 2016-06-21 MED ORDER — SODIUM CHLORIDE 0.9 % IV BOLUS (SEPSIS)
1000.0000 mL | Freq: Once | INTRAVENOUS | Status: AC
  Administered 2016-06-21: 1000 mL via INTRAVENOUS

## 2016-06-21 MED ORDER — ASPIRIN EC 81 MG PO TBEC
81.0000 mg | DELAYED_RELEASE_TABLET | Freq: Every day | ORAL | Status: DC
  Administered 2016-06-21 – 2016-08-01 (×42): 81 mg via ORAL
  Filled 2016-06-21 (×42): qty 1

## 2016-06-21 MED ORDER — ONDANSETRON HCL 4 MG/2ML IJ SOLN
4.0000 mg | Freq: Four times a day (QID) | INTRAMUSCULAR | Status: DC | PRN
  Administered 2016-07-05 – 2016-07-19 (×5): 4 mg via INTRAVENOUS
  Filled 2016-06-21 (×5): qty 2

## 2016-06-21 MED ORDER — ADULT MULTIVITAMIN W/MINERALS CH
1.0000 | ORAL_TABLET | Freq: Every day | ORAL | Status: DC
  Administered 2016-06-22 – 2016-07-26 (×9): 1 via ORAL
  Filled 2016-06-21 (×27): qty 1

## 2016-06-21 MED ORDER — MAGNESIUM OXIDE 400 (241.3 MG) MG PO TABS
200.0000 mg | ORAL_TABLET | Freq: Every day | ORAL | Status: DC
  Administered 2016-06-22 – 2016-06-26 (×5): 200 mg via ORAL
  Filled 2016-06-21 (×5): qty 1

## 2016-06-21 MED ORDER — VANCOMYCIN HCL IN DEXTROSE 1-5 GM/200ML-% IV SOLN: 1000.0000 mg | Freq: Once | INTRAVENOUS | Status: DC

## 2016-06-21 MED ORDER — HYDROCODONE-ACETAMINOPHEN 10-325 MG PO TABS
1.0000 | ORAL_TABLET | Freq: Four times a day (QID) | ORAL | Status: DC | PRN
  Administered 2016-06-21 – 2016-06-24 (×8): 1 via ORAL
  Filled 2016-06-21 (×8): qty 1

## 2016-06-21 MED ORDER — ZOLPIDEM TARTRATE 5 MG PO TABS
5.0000 mg | ORAL_TABLET | Freq: Every evening | ORAL | Status: DC | PRN
  Administered 2016-06-22 – 2016-07-10 (×17): 5 mg via ORAL
  Filled 2016-06-21 (×17): qty 1

## 2016-06-21 MED ORDER — PANTOPRAZOLE SODIUM 40 MG PO TBEC
40.0000 mg | DELAYED_RELEASE_TABLET | Freq: Every day | ORAL | Status: DC
  Administered 2016-06-21 – 2016-06-26 (×6): 40 mg via ORAL
  Filled 2016-06-21 (×6): qty 1

## 2016-06-21 MED ORDER — APIXABAN 5 MG PO TABS: 5.0000 mg | ORAL_TABLET | Freq: Two times a day (BID) | ORAL | Status: DC

## 2016-06-21 MED ORDER — PIPERACILLIN-TAZOBACTAM 3.375 G IVPB
3.3750 g | Freq: Three times a day (TID) | INTRAVENOUS | Status: DC
  Administered 2016-06-22 – 2016-06-25 (×11): 3.375 g via INTRAVENOUS
  Filled 2016-06-21 (×12): qty 50

## 2016-06-21 MED ORDER — ACETAMINOPHEN 325 MG PO TABS
650.0000 mg | ORAL_TABLET | Freq: Four times a day (QID) | ORAL | Status: DC | PRN
  Administered 2016-06-21 – 2016-08-01 (×36): 650 mg via ORAL
  Filled 2016-06-21 (×37): qty 2

## 2016-06-21 MED ORDER — LORAZEPAM 1 MG PO TABS
1.0000 mg | ORAL_TABLET | Freq: Once | ORAL | Status: AC
  Administered 2016-06-21: 1 mg via ORAL
  Filled 2016-06-21: qty 1

## 2016-06-21 MED ORDER — VANCOMYCIN HCL IN DEXTROSE 750-5 MG/150ML-% IV SOLN
750.0000 mg | INTRAVENOUS | Status: DC
  Administered 2016-06-22 – 2016-06-24 (×3): 750 mg via INTRAVENOUS
  Filled 2016-06-21 (×3): qty 150

## 2016-06-21 MED ORDER — POTASSIUM CHLORIDE ER 10 MEQ PO TBCR: EXTENDED_RELEASE_TABLET | ORAL | Status: DC

## 2016-06-21 NOTE — Telephone Encounter (Signed)
F/U  Erin [name] states he is returning a call from [facility] regarding test results (labs) for his mother.

## 2016-06-21 NOTE — Progress Notes (Signed)
Pt left for MRI.

## 2016-06-21 NOTE — Patient Instructions (Signed)
Admission to hospital
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
## 2016-06-21 NOTE — Telephone Encounter (Signed)
-----   Message from Leone Brand, NP sent at 06/20/2016  4:10 PM EDT ----- She needs to increase kdur to 30 meq today then 20 meq every other day with the lasix, decrease lasix to 40 mg every other day.  Her kidney function is stressed.   Her WBC is elevated and I have notified Dr. Rondel Jumbo office and waiting for directions.  I am still waiting for U/A as well.    Let her son and the SNF know for orders.  Thank you.

## 2016-06-21 NOTE — Addendum Note (Signed)
Addended by: Tonita Phoenix on: 06/21/2016 10:38 AM   Modules accepted: Orders

## 2016-06-21 NOTE — Progress Notes (Signed)
[address]       [place] [address]             [phone]      Erin [name] [facility] Medical Record #[id number] Date of Birth: [birth year]  Referring: Caffie Damme, MD Primary Care: Caffie Damme, MD  Chief Complaint:   POST OP FOLLOW UP   DATE OF PROCEDURE:  05/30/2016 DATE OF DISCHARGE:                              OPERATIVE REPORT   PREOPERATIVE DIAGNOSIS:  Severe 3-vessel coronary disease.  POSTOPERATIVE DIAGNOSIS:  Severe 3-vessel coronary disease.  PROCEDURE:  Median sternotomy, extracorporeal circulation, Coronary artery bypass grafting x4      Left internal mammary artery to left anterior descending,      Saphenous vein graft to ramus intermedius,      Sequential saphenous vein graft to posterior descending and OM2, Endoscopic vein harvest right leg and left thigh Left atrial appendage clip  SURGEON:  Viviann Spare C. Dorris Fetch, M.D.  ASSISTANT:  Doree Fudge, PA.  ANESTHESIA:  General.  History of Present Illness:    The patient is a 71 year old female status post the above described procedure seen in the office on today's date for follow-up. She was seen yesterday by cardiology and was found to have a temperature of 102. She had multiple blood tests drawn but no blood cultures. A chest x-ray was fairly unremarkable and showed no evidence for pneumonia. She has been in and a skilled nursing facility since discharge. She was discharged from there on today's date. Laboratory studies revealed some significant findings including a significant leukocytosis, hyponatremia, renal insufficiency and significantly elevated TSH. Additionally she had a low potassium of 3.3. She was sent to our office for further evaluation and management. She is also known to have a back injury as well as some misalignment hardware that requires future surgery which was put on hold because of the cardiac surgery. She primarily feels more lethargic. Her son  notes this as well as. She had been doing quite well apparently with physical therapy until Monday. The symptoms continue to worsen over time and she is definitely evidencing some failure to thrive. She denies shortness of breath or significant cough. She denies urinary tract symptoms. She is not having sweats or chills. She has had no significant difficulties related to her cardiac surgical incisions with the exception of slight serous drainage from the [facility] site.      Past Medical History:  Diagnosis Date  . Arthritis    "all my back is eat up w/it; knees too" (05/24/2016)  . Chronic bronchitis (HCC)   . Chronic lower back pain   . Dyspnea    "since OR 04/2016" (05/24/2016)  . Family history of adverse reaction to anesthesia    "daughter gets PONV" (05/24/2016)  . GERD (gastroesophageal reflux disease)    occ  . High cholesterol   . History of stomach ulcers   . Hypertension   . Hypothyroidism   . Migraine    "usually have one monthly; nothing since 04/25/2016)  . Pneumonia ~ 2002     History  Smoking Status  . Never Smoker  Smokeless Tobacco  . Never Used    History  Alcohol Use No     Allergies  Allergen Reactions  . Ace Inhibitors Swelling    ACE stopped after pt seen in  ED with facial swelling- allergy testing pending  . Codeine Nausea And Vomiting    Current Outpatient Prescriptions  Medication Sig Dispense Refill  . amiodarone (PACERONE) 200 MG tablet Take 200 mg by mouth daily.    Marland Kitchen amLODipine (NORVASC) 5 MG tablet Take 1 tablet (5 mg total) by mouth daily.    Marland Kitchen apixaban (ELIQUIS) 5 MG TABS tablet Take 1 tablet (5 mg total) by mouth 2 (two) times daily. 60 tablet 1  . atorvastatin (LIPITOR) 80 MG tablet Take 1 tablet (80 mg total) by mouth daily at 6 PM.    . butalbital-acetaminophen-caffeine (FIORICET, ESGIC) 50-325-40 MG tablet Take 1 tablet by mouth 2 (two) times daily as needed for migraine.    . furosemide (LASIX) 40 MG tablet Take 1 tablet (40 mg total) by  mouth every other day. 30 tablet   . HYDROcodone-acetaminophen (NORCO) 10-325 MG tablet Take 1 tablet by mouth every 6 (six) hours as needed for severe pain (for pain.). 30 tablet 0  . levothyroxine (SYNTHROID, LEVOTHROID) 112 MCG tablet Take 112 mcg by mouth daily before breakfast.    . Metoprolol Tartrate 37.5 MG TABS Take 37.5 mg by mouth 2 (two) times daily.    . ondansetron (ZOFRAN) 4 MG tablet Take 4 mg by mouth every 4 (four) hours as needed for nausea or vomiting.    . potassium chloride (K-DUR) 10 MEQ tablet Take 20 mEq by mouth every other day.    . RABEprazole (ACIPHEX) 20 MG tablet Take 20 mg by mouth 2 (two) times daily as needed (scheduled every morning & at night if needed).    . triamterene-hydrochlorothiazide (MAXZIDE-25) 37.5-25 MG tablet Take 1 tablet by mouth daily.    Marland Kitchen zolpidem (AMBIEN) 10 MG tablet Take 10 mg by mouth at bedtime as needed for sleep.    Marland Kitchen aspirin EC 81 MG EC tablet Take 1 tablet (81 mg total) by mouth daily. (Patient not taking: Reported on 06/21/2016)    . Cholecalciferol (VITAMIN D) 2000 units CAPS Take 2,000 Units by mouth daily after breakfast.    . cycloSPORINE (RESTASIS) 0.05 % ophthalmic emulsion Place 1 drop into both eyes 2 (two) times daily.    . Magnesium 250 MG TABS Take 250 mg by mouth daily after breakfast.    . Multiple Vitamin (MULTIVITAMIN WITH MINERALS) TABS tablet Take 1 tablet by mouth daily.     No current facility-administered medications for this visit.        Physical Exam: BP 126/67 (BP Location: Right Arm, Patient Position: Sitting, Cuff Size: Large)   Pulse 81   Temp (!) 101.3 F (38.5 C) (Oral) Comment (Src): TYLENOL @ 1 PM  Resp 16   Ht 5\' 3"  (1.6 m)   Wt 173 lb (78.5 kg)   SpO2 99% Comment: ON RA  BMI 30.65 kg/m   General appearance: alert, cooperative and no distress Heart: regular rate and rhythm, S1, S2 normal, no murmur, click, rub or gallop Lungs: clear to auscultation bilaterally Abdomen: Benign  exam Extremities: Positive lower extremity edema Wound: Minor EVH site serous drainage with no erythema. The sternal incision is healing quite well without evidence of infection or instability.   Diagnostic Studies & Laboratory data:     Recent Radiology Findings:   Dg Chest 2 View  Result Date: 06/20/2016 CLINICAL DATA:  Recent pneumonia EXAM: CHEST  2 VIEW COMPARISON:  June 05, 2016 FINDINGS: There is a small pleural effusion on the left. There is mild left lower lobe  atelectatic change. Lungs elsewhere are currently clear. Heart is mildly enlarged with pulmonary vascularity within normal limits. There is aortic atherosclerosis. No adenopathy. Patient is status post coronary artery bypass grafting. There is a left apical appendage clamp. There is postoperative change in the lower cervical spine. There is left carotid artery calcification. IMPRESSION: No edema or consolidation. Mild left base atelectasis with small left pleural effusion. Stable cardiac prominence. There is aortic atherosclerosis. Electronically Signed   By: Bretta Bang III M.D.   On: 06/20/2016 12:49      Recent Lab Findings: Lab Results  Component Value Date   WBC 19.0 (H) 06/20/2016   HGB 10.2 (L) 06/04/2016   HCT 29.0 (L) 06/20/2016   PLT 147 (L) 06/20/2016   GLUCOSE 146 (H) 06/20/2016   ALT 18 06/20/2016   AST 30 06/20/2016   NA 126 (L) 06/20/2016   K 3.3 (L) 06/20/2016   CL 81 (L) 06/20/2016   CREATININE 1.83 (H) 06/20/2016   BUN 29 (H) 06/20/2016   CO2 25 06/20/2016   TSH 16.570 (H) 06/20/2016   INR 1.28 05/30/2016   HGBA1C 5.9 (H) 05/30/2016      Assessment / Plan:  The patient is acutely ill with temperature currently 101.3 and potential sepsis. Shee was just discharged from the nursing facility and is definitely experiencing some failure to thrive. There are also multiple medical comorbidities contributing including hyponatremia, hypokalemia, leukocytosis, acute and chronic anemia, elevated TSH if  accurate. I am concerned there could be a potential infection related to back hardware.  She will require admission for further evaluation and treatment. The Hospitalist has agreed to accept on their service and we will cont to see as well.         Alfreida Steffenhagen E, PA-C

## 2016-06-21 NOTE — Progress Notes (Addendum)
Pharmacy Antibiotic Note  Erin [name] is a 71 y.o. female admitted on 06/21/2016 with sepsis.  Pharmacy has been consulted for vancomycin and Zosyn dosing.  Patient has AKI with SCr at 1.83 and estimated CrCL of 28 ml/min.  She is febrile and has leukocytosis.   Plan: - Vanc 1500mg  IV x 1, then 750mg  IV Q24H - Zosyn 3.375gm IV Q8H, 4 hr infusion - Monitor renal fxn, clinical progress, vanc trough at Css - F/U lytes, free T4/T3 - Reduce Eliquis to 2.5mg  PO BID for now.  May need to transition to IV heparin if renal function doesn't improve.     Temp (24hrs), Avg:102.3 F (39.1 C), Min:101.3 F (38.5 C), Max:103.2 F (39.6 C)   Recent Labs Lab 06/20/16 1139  WBC 19.0*  CREATININE 1.83*    Estimated Creatinine Clearance: 27.5 mL/min (A) (by C-G formula based on SCr of 1.83 mg/dL (H)).    Allergies  Allergen Reactions  . Ace Inhibitors Swelling    ACE stopped after pt seen in ED with facial swelling- allergy testing pending  . Codeine Nausea And Vomiting     Vanc 5/3 >> Zosyn 5/3 >>  5/3 BCx -     Ceferino Lang D. Laney Potash, PharmD, BCPS Pager:  [phone] 06/21/2016, 6:15 PM

## 2016-06-21 NOTE — Progress Notes (Signed)
Pt is back from MRI. Tele box applied. Temp is 99.2. Will continue to monitor

## 2016-06-22 ENCOUNTER — Encounter (HOSPITAL_COMMUNITY): Payer: Self-pay | Admitting: *Deleted

## 2016-06-22 DIAGNOSIS — I480 Paroxysmal atrial fibrillation: Secondary | ICD-10-CM

## 2016-06-22 LAB — CBC
HCT: 27.1 % — ABNORMAL LOW (ref 36.0–46.0)
Hemoglobin: 8.7 g/dL — ABNORMAL LOW (ref 12.0–15.0)
MCH: 26 pg (ref 26.0–34.0)
MCHC: 32.1 g/dL (ref 30.0–36.0)
MCV: 80.9 fL (ref 78.0–100.0)
Platelets: 83 10*3/uL — ABNORMAL LOW (ref 150–400)
RBC: 3.35 MIL/uL — ABNORMAL LOW (ref 3.87–5.11)
RDW: 17.1 % — ABNORMAL HIGH (ref 11.5–15.5)
WBC: 9.8 10*3/uL (ref 4.0–10.5)

## 2016-06-22 LAB — BLOOD CULTURE ID PANEL (REFLEXED)

## 2016-06-22 LAB — BASIC METABOLIC PANEL
Anion gap: 11 (ref 5–15)
BUN: 30 mg/dL — ABNORMAL HIGH (ref 6–20)
CO2: 24 mmol/L (ref 22–32)
Calcium: 7.8 mg/dL — ABNORMAL LOW (ref 8.9–10.3)
Chloride: 91 mmol/L — ABNORMAL LOW (ref 101–111)
Creatinine, Ser: 1.48 mg/dL — ABNORMAL HIGH (ref 0.44–1.00)
GFR calc Af Amer: 40 mL/min — ABNORMAL LOW (ref >60)
GFR calc non Af Amer: 34 mL/min — ABNORMAL LOW (ref >60)
Glucose, Bld: 124 mg/dL — ABNORMAL HIGH (ref 65–99)
Potassium: 3.7 mmol/L (ref 3.5–5.1)
Sodium: 126 mmol/L — ABNORMAL LOW (ref 135–145)

## 2016-06-22 LAB — T3, FREE: T3, Free: 1.3 pg/mL — ABNORMAL LOW (ref 2.0–4.4)

## 2016-06-22 LAB — INFLUENZA PANEL BY PCR (TYPE A & B)
Influenza A By PCR: NEGATIVE
Influenza B By PCR: NEGATIVE

## 2016-06-22 LAB — SEDIMENTATION RATE: Sed Rate: 95 mm/hr — ABNORMAL HIGH (ref 0–22)

## 2016-06-22 LAB — C-REACTIVE PROTEIN: CRP: 29.7 mg/dL — ABNORMAL HIGH (ref <1.0)

## 2016-06-22 LAB — LACTIC ACID, PLASMA
Lactic Acid, Venous: 2.2 mmol/L (ref 0.5–1.9)
Lactic Acid, Venous: 1.3 mmol/L (ref 0.5–1.9)

## 2016-06-22 MED ORDER — SODIUM CHLORIDE 0.9 % IV BOLUS (SEPSIS)
500.0000 mL | Freq: Once | INTRAVENOUS | Status: AC
  Administered 2016-06-22: 500 mL via INTRAVENOUS

## 2016-06-22 MED ORDER — CYCLOBENZAPRINE HCL 10 MG PO TABS
10.0000 mg | ORAL_TABLET | Freq: Three times a day (TID) | ORAL | Status: DC | PRN
  Administered 2016-06-22 – 2016-07-12 (×35): 10 mg via ORAL
  Filled 2016-06-22 (×41): qty 1

## 2016-06-22 NOTE — Progress Notes (Signed)
NP Craige Cotta paged and made aware of critical lab value received:  Lactic acid 2.2.
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
## 2016-06-22 NOTE — Progress Notes (Addendum)
[address]       [place] [address]             [phone]         Subjective: Feels better, back hurting   Objective: Vital signs in last 24 hours: Temp:  [99.2 F (37.3 C)-103.2 F (39.6 C)] 99.4 F (37.4 C) (05/04 0430) Pulse Rate:  [73-84] 78 (05/04 0430) Cardiac Rhythm: Normal sinus rhythm (05/04 0700) Resp:  [16-18] 18 (05/04 0430) BP: (102-140)/(47-68) 102/47 (05/04 0430) SpO2:  [94 %-100 %] 100 % (05/04 0430) Weight:  [173 lb (78.5 kg)-175 lb 7.8 oz (79.6 kg)] 175 lb 7.8 oz (79.6 kg) (05/04 0435)  Hemodynamic parameters for last 24 hours:    Intake/Output from previous day: 05/03 0701 - 05/04 0700 In: 3778.3 [P.O.:480; I.V.:798.3; IV Piggyback:2500] Out: 1200 [Urine:1200] Intake/Output this shift: No intake/output data recorded.  General appearance: alert, cooperative and no distress Heart: regular rate and rhythm Lungs: clear to auscultation bilaterally Abdomen: benign Extremities: + edema Wound: incis look OK  Lab Results:  Recent Labs  06/21/16 1922 06/22/16 0158  WBC 10.2 9.8  HGB 8.6* 8.7*  HCT 26.3* 27.1*  PLT 105* 83*   BMET:  Recent Labs  06/21/16 1922 06/22/16 0158  NA 125* 126*  K 3.2* 3.7  CL 89* 91*  CO2 25 24  GLUCOSE 133* 124*  BUN 33* 30*  CREATININE 1.60* 1.48*  CALCIUM 7.8* 7.8*    PT/INR:  Recent Labs  06/21/16 1922  LABPROT 24.5*  INR 2.17   ABG    Component Value Date/Time   PHART 7.301 (L) 05/30/2016 2139   HCO3 22.0 05/30/2016 2139   TCO2 24 05/31/2016 1802   ACIDBASEDEF 4.0 (H) 05/30/2016 2139   O2SAT 96.0 05/30/2016 2139   CBG (last 3)  No results for input(s): GLUCAP in the last 72 hours.  Meds Scheduled Meds: . apixaban  2.5 mg Oral BID  . aspirin EC  81 mg Oral Daily  . atorvastatin  80 mg Oral q1800  . cholecalciferol  2,000 Units Oral QPC breakfast  . cycloSPORINE  1 drop Both Eyes BID  . levothyroxine  112 mcg Oral QAC breakfast  . magnesium oxide  200 mg Oral  QPC breakfast  . metoprolol tartrate  37.5 mg Oral BID  . multivitamin with minerals  1 tablet Oral Daily  . pantoprazole  40 mg Oral Daily  . sodium chloride flush  3 mL Intravenous Q12H   Continuous Infusions: . sodium chloride 100 mL/hr at 06/21/16 1901  . piperacillin-tazobactam (ZOSYN)  IV 3.375 g (06/22/16 0422)  . vancomycin     PRN Meds:.acetaminophen **OR** acetaminophen, butalbital-acetaminophen-caffeine, HYDROcodone-acetaminophen, ondansetron **OR** ondansetron (ZOFRAN) IV, zolpidem  Xrays Dg Chest 2 View  Result Date: 06/20/2016 CLINICAL DATA:  Recent pneumonia EXAM: CHEST  2 VIEW COMPARISON:  June 05, 2016 FINDINGS: There is a small pleural effusion on the left. There is mild left lower lobe atelectatic change. Lungs elsewhere are currently clear. Heart is mildly enlarged with pulmonary vascularity within normal limits. There is aortic atherosclerosis. No adenopathy. Patient is status post coronary artery bypass grafting. There is a left apical appendage clamp. There is postoperative change in the lower cervical spine. There is left carotid artery calcification. IMPRESSION: No edema or consolidation. Mild left base atelectasis with small left pleural effusion. Stable cardiac prominence. There is aortic atherosclerosis. Electronically Signed   By: Bretta Bang III M.D.   On: 06/20/2016 12:49  Mr Lumbar Spine Wo Contrast  Result Date: 06/21/2016 CLINICAL DATA:  71 y/o F; central lower back pain. Recent lumbar fusion. Rule out abscess. EXAM: MRI LUMBAR SPINE WITHOUT CONTRAST TECHNIQUE: Multiplanar, multisequence MR imaging of the lumbar spine was performed. No intravenous contrast was administered. COMPARISON:  05/24/2016 CT of the lumbar spine. 04/11/2016 lumbar spine MRI. FINDINGS: Segmentation:  Standard. Alignment:  Physiologic. Vertebrae: Posterior instrumented fusion hardware at the L4 through S1 levels partially obscures vertebral bodies at those levels. L4-S1 laminectomy.  No definite evidence for discitis, acute fracture, no or suspicious bone lesion. Stable anterior compression deformity of the L1 vertebral body with large superior endplate Schmorl's node. 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 Conus medullaris: Extends to the L1 level and appears normal. Paraspinal and other soft tissues: Fluid collection within the laminectomy bed from the mid L4 to the L5-S1 disc level measuring 24 x 37 x 37 mm (AP x ML x CC series 5: Image 23 and 6:8). The fluid collection extends into the lateral epidural space bilaterally. There is edema within the bilateral paraspinal muscles. Disc levels: L1-2: Stable small disc bulge and mild facet hypertrophy. Mild bilateral foraminal narrowing. No significant canal stenosis. L2-3: Stable small disc bulge with mild facet and ligamentum flavum hypertrophy. Mild bilateral foraminal and lateral recess narrowing. L3-4: Mild progression of degenerative changes with disc bulge and facet/ligamentum flavum hypertrophy. Mild bilateral foraminal narrowing and mild canal stenosis. L4-5: Discectomy, posterior fusion, and laminectomy. Residual small bulge of disc material combined with mass effect on the thecal sac from the fluid collection and paraspinal muscles and lateral epidural spaces results in mild canal stenosis. Moderate bilateral foraminal narrowing. L5-S1: Discectomy, posterior fusion, and laminectomy. Residual bulge of disc material combined with advanced facet hypertrophy. Stable severe bilateral foraminal narrowing. Mass effect from the fluid collection within the laminectomy bed mildly narrows the spinal canal. IMPRESSION: 1. Posterior instrumented fusion, interbody fusion, and laminectomy at L4 through S1. 2. Fluid collection within the laminectomy bed extending into the lateral epidural spaces may represent a postoperative seroma or possibly pseudomeningocele. Given the  absence of bone marrow edema in the surrounding spine this is less likely to represent an abscess. The fluid collection exerts mass effect on the thecal sac with mild stenosis. 3. Mild progression of degenerative changes at the L3-4 level with mild canal stenosis. Otherwise stable lumbar spondylosis. 4. Multilevel mild foraminal narrowing with moderate bilateral foraminal narrowing at L4-5 and severe bilateral foraminal narrowing at L5-S1. Electronically Signed   By: Mitzi Hansen M.D.   On: 06/21/2016 20:35   Dg Chest Port 1 View  Result Date: 06/21/2016 CLINICAL DATA:  Sepsis EXAM: PORTABLE CHEST 1 VIEW COMPARISON:  06/20/2016 FINDINGS: Minimal basilar atelectasis persist without worsening or new finding. Heart size is stable. Pulmonary vascularity is normal. Aortic atherosclerosis again noted. Previous CABG. IMPRESSION: Mild basilar atelectasis. Electronically Signed   By: Paulina Fusi M.D.   On: 06/21/2016 18:18    Assessment/Plan:  Improving clinically May have a component of UTI- CX pending  Might be good to have neurosurgery eval the MRI  Leukocytosis improved Creat improved amio stopped for increased TSH Sodium a little better  Lactic acid improved Med management as per hospitalist  LOS: 1 day    GOLD,WAYNE E 06/22/2016 Patient seen and examined Chest and leg incisions are Ok CXR unremarkable Agree with empiric antibiotics  Viviann Spare C. Dorris Fetch, MD [facility] [phone])  [phone]  

## 2016-06-22 NOTE — Progress Notes (Signed)
Pt has a temperature of 103.2. Tylenol suppository administered, MD notified, new order received.

## 2016-06-22 NOTE — Progress Notes (Signed)
  PHARMACY - PHYSICIAN COMMUNICATION CRITICAL VALUE ALERT - BLOOD CULTURE IDENTIFICATION (BCID)  Results for orders placed or performed during the hospital encounter of 06/21/16  Blood Culture ID Panel (Reflexed) (Collected: 06/21/2016  7:23 PM)  Result Value Ref Range   Enterococcus species NOT DETECTED NOT DETECTED   Listeria monocytogenes NOT DETECTED NOT DETECTED   Staphylococcus species NOT DETECTED NOT DETECTED   Staphylococcus aureus NOT DETECTED NOT DETECTED   Streptococcus species NOT DETECTED NOT DETECTED   Streptococcus agalactiae NOT DETECTED NOT DETECTED   Streptococcus pneumoniae NOT DETECTED NOT DETECTED   Streptococcus pyogenes NOT DETECTED NOT DETECTED   Acinetobacter baumannii NOT DETECTED NOT DETECTED   Enterobacteriaceae species DETECTED (A) NOT DETECTED   Enterobacter cloacae complex NOT DETECTED NOT DETECTED   Escherichia coli DETECTED (A) NOT DETECTED   Klebsiella oxytoca NOT DETECTED NOT DETECTED   Klebsiella pneumoniae NOT DETECTED NOT DETECTED   Proteus species NOT DETECTED NOT DETECTED   Serratia marcescens NOT DETECTED NOT DETECTED   Carbapenem resistance NOT DETECTED NOT DETECTED   Haemophilus influenzae NOT DETECTED NOT DETECTED   Neisseria meningitidis NOT DETECTED NOT DETECTED   Pseudomonas aeruginosa NOT DETECTED NOT DETECTED   Candida albicans NOT DETECTED NOT DETECTED   Candida glabrata NOT DETECTED NOT DETECTED   Candida krusei NOT DETECTED NOT DETECTED   Candida parapsilosis NOT DETECTED NOT DETECTED   Candida tropicalis NOT DETECTED NOT DETECTED    Name of physician (or Provider) Contacted: Thurman Coyer  Blood cx positive with GNRs. BCID detected E. Coli. Currently on broad spectrum abx for sepsis. 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 Plan to get MRI of the spine to r/o hardware infection.  Changes to prescribed antibiotics required: Continue broad spectrum abx for now until MRI of spine rules  out hardware infection. If negative provider will plan on de-escalating to ceftriaxone for E. Coli bacteremia  Enzo Bi, PharmD, BCPS Clinical Pharmacist Pager [phone] 06/22/2016 8:45 AM

## 2016-06-23 ENCOUNTER — Encounter (HOSPITAL_COMMUNITY): Payer: Self-pay

## 2016-06-23 LAB — CBC
HCT: 26.1 % — ABNORMAL LOW (ref 36.0–46.0)
Hemoglobin: 8.4 g/dL — ABNORMAL LOW (ref 12.0–15.0)
MCH: 26.1 pg (ref 26.0–34.0)
MCHC: 32.2 g/dL (ref 30.0–36.0)
MCV: 81.1 fL (ref 78.0–100.0)
Platelets: 95 10*3/uL — ABNORMAL LOW (ref 150–400)
RBC: 3.22 MIL/uL — ABNORMAL LOW (ref 3.87–5.11)
RDW: 17.3 % — ABNORMAL HIGH (ref 11.5–15.5)
WBC: 9.6 10*3/uL (ref 4.0–10.5)

## 2016-06-23 LAB — BASIC METABOLIC PANEL
Anion gap: 10 (ref 5–15)
BUN: 21 mg/dL — ABNORMAL HIGH (ref 6–20)
CO2: 24 mmol/L (ref 22–32)
Calcium: 8 mg/dL — ABNORMAL LOW (ref 8.9–10.3)
Chloride: 92 mmol/L — ABNORMAL LOW (ref 101–111)
Creatinine, Ser: 1.22 mg/dL — ABNORMAL HIGH (ref 0.44–1.00)
GFR calc Af Amer: 50 mL/min — ABNORMAL LOW (ref >60)
GFR calc non Af Amer: 43 mL/min — ABNORMAL LOW (ref >60)
Glucose, Bld: 120 mg/dL — ABNORMAL HIGH (ref 65–99)
Potassium: 3.4 mmol/L — ABNORMAL LOW (ref 3.5–5.1)
Sodium: 126 mmol/L — ABNORMAL LOW (ref 135–145)

## 2016-06-23 LAB — URINE CULTURE: Culture: >=100000 — AB

## 2016-06-23 MED ORDER — SODIUM CHLORIDE 0.9 % IV SOLN
INTRAVENOUS | Status: DC
  Administered 2016-06-23 – 2016-06-24 (×2): 75 mL/h via INTRAVENOUS

## 2016-06-23 MED ORDER — APIXABAN 5 MG PO TABS
5.0000 mg | ORAL_TABLET | Freq: Two times a day (BID) | ORAL | Status: DC
  Administered 2016-06-23 – 2016-06-25 (×5): 5 mg via ORAL
  Filled 2016-06-23 (×6): qty 1

## 2016-06-23 MED ORDER — LEVALBUTEROL HCL 0.63 MG/3ML IN NEBU
0.6300 mg | INHALATION_SOLUTION | Freq: Four times a day (QID) | RESPIRATORY_TRACT | Status: DC | PRN
  Administered 2016-06-23 – 2016-06-25 (×3): 0.63 mg via RESPIRATORY_TRACT
  Filled 2016-06-23 (×4): qty 3

## 2016-06-23 MED ORDER — POTASSIUM CHLORIDE CRYS ER 20 MEQ PO TBCR
40.0000 meq | EXTENDED_RELEASE_TABLET | Freq: Once | ORAL | Status: AC
  Administered 2016-06-23: 40 meq via ORAL
  Filled 2016-06-23: qty 2
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
## 2016-06-23 NOTE — Progress Notes (Signed)
      [address]       [place] [address]             [phone]         Subjective: Shares she continues to have back pain.   Objective: Vital signs in last 24 hours: Temp:  [98.1 F (36.7 C)-101.3 F (38.5 C)] 98.6 F (37 C) (05/05 0407) Pulse Rate:  [70-91] 80 (05/05 0407) Cardiac Rhythm: Normal sinus rhythm (05/05 0726) Resp:  [18-20] 20 (05/05 0407) BP: (114-128)/(54-69) 122/60 (05/05 0407) SpO2:  [92 %-96 %] 92 % (05/05 0407) Weight:  [81.7 kg (180 lb 3.2 oz)] 81.7 kg (180 lb 3.2 oz) (05/05 0407)     Intake/Output from previous day: 05/04 0701 - 05/05 0700 In: 590 [P.O.:490; I.V.:100] Out: 800 [Urine:800] Intake/Output this shift: Total I/O In: -  Out: 50 [Stool:50]  General appearance: alert, cooperative and no distress Heart: regular rate and rhythm, S1, S2 normal, no murmur, click, rub or gallop Lungs: clear to auscultation bilaterally Abdomen: soft, non-tender; bowel sounds normal; no masses,  no organomegaly Extremities: extremities normal, atraumatic, no cyanosis or edema Wound: chest incision clean and dry. Small amount of clear drainage from right [facility] site  Lab Results:  Recent Labs  06/22/16 0158 06/23/16 0307  WBC 9.8 9.6  HGB 8.7* 8.4*  HCT 27.1* 26.1*  PLT 83* 95*   BMET:  Recent Labs  06/22/16 0158 06/23/16 0307  NA 126* 126*  K 3.7 3.4*  CL 91* 92*  CO2 24 24  GLUCOSE 124* 120*  BUN 30* 21*  CREATININE 1.48* 1.22*  CALCIUM 7.8* 8.0*    PT/INR:  Recent Labs  06/21/16 1922  LABPROT 24.5*  INR 2.17   ABG    Component Value Date/Time   PHART 7.301 (L) 05/30/2016 2139   HCO3 22.0 05/30/2016 2139   TCO2 24 05/31/2016 1802   ACIDBASEDEF 4.0 (H) 05/30/2016 2139   O2SAT 96.0 05/30/2016 2139   CBG (last 3)  No results for input(s): GLUCAP in the last 72 hours.  Assessment/Plan:  1. Sepsis from E.coli. BC + E. Coli. On vanc and zosyn 2. Neurosurg recommended IV antibiotic therapy. MRI of the lumbar  spine shows fluid collection within the laminectomy bed extended into the lateral epidural spaces.  3. Leukocytosis improved 4. Creatinine improved and now down to 1.22 5. Hx of A. Fib, Amio discontinued due to increased TSH, continue BB for rhythm control 6. Chest incision and leg incision without infection  Plan: medical management per hospitalist.        LOS: 2 days    Sharlene Dory 06/23/2016

## 2016-06-24 ENCOUNTER — Encounter (HOSPITAL_COMMUNITY): Payer: Self-pay

## 2016-06-24 ENCOUNTER — Inpatient Hospital Stay (HOSPITAL_COMMUNITY): Payer: Medicare HMO

## 2016-06-24 LAB — CULTURE, BLOOD (ROUTINE X 2)
Special Requests: ADEQUATE
Special Requests: ADEQUATE

## 2016-06-24 MED ORDER — HYDROCODONE-ACETAMINOPHEN 10-325 MG PO TABS
2.0000 | ORAL_TABLET | ORAL | Status: DC | PRN
  Administered 2016-06-24 – 2016-06-25 (×3): 2 via ORAL
  Filled 2016-06-24 (×3): qty 2

## 2016-06-24 MED ORDER — FUROSEMIDE 10 MG/ML IJ SOLN
60.0000 mg | Freq: Once | INTRAMUSCULAR | Status: AC
  Administered 2016-06-24: 60 mg via INTRAVENOUS
  Filled 2016-06-24: qty 6

## 2016-06-24 MED ORDER — HYDROMORPHONE HCL 1 MG/ML IJ SOLN: 0.5000 mg | INTRAMUSCULAR | Status: DC | PRN

## 2016-06-24 MED ORDER — IPRATROPIUM-ALBUTEROL 0.5-2.5 (3) MG/3ML IN SOLN
3.0000 mL | Freq: Once | RESPIRATORY_TRACT | Status: AC
  Administered 2016-06-24: 3 mL via RESPIRATORY_TRACT
  Filled 2016-06-24: qty 3

## 2016-06-24 NOTE — Progress Notes (Signed)
Pharmacy Antibiotic Note  Erin [name] is a 71 y.o. female admitted on 06/21/2016 with sepsis.  Pharmacy managing vancomycin and Zosyn dosing.  D#3 of antibiotics. Afebrile with normal WBC. SCr improved to 9.6   Plan: - Continue Vanc 750 mg IV Q 24h. Trough tomorrow if continued  - Zosyn 3.375gm IV Q8H, 4 hr infusion. Consider narrowing to ceftriaxone given pan-sensitive E.coli in cultures and clinical improvement.  - Monitor renal fxn, clinical progress, vanc trough at Css  Height: 5\' 3"  (160 cm) Weight: 185 lb 6.4 oz (84.1 kg) IBW/kg (Calculated) : 52.4  Temp (24hrs), Avg:98.8 F (37.1 C), Min:98.3 F (36.8 C), Max:99.6 F (37.6 C)   Recent Labs Lab 06/20/16 1139 06/21/16 1922 06/21/16 2240 06/22/16 0158 06/22/16 0225 06/23/16 0307  WBC 19.0* 10.2  --  9.8  --  9.6  CREATININE 1.83* 1.60*  --  1.48*  --  1.22*  LATICACIDVEN  --  1.2 2.2*  --  1.3  --     Estimated Creatinine Clearance: 42.8 mL/min (A) (by C-G formula based on SCr of 1.22 mg/dL (H)).    Allergies  Allergen Reactions  . Ace Inhibitors Swelling    ACE stopped after pt seen in ED with facial swelling- allergy testing pending  . Morphine And Related   . Codeine Nausea And Vomiting     Vanc 5/3 >> Zosyn 5/3 >>  5/5 Blood x2 - IP  5/3 Blood x 2 - E coli pan sensitive  5/3 BCID - E coli 5/3 urine - E.coli pan sensitive  5/3 Influenza PCR negative   Vinnie Level, PharmD., BCPS Clinical Pharmacist Pager [phone]
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
## 2016-06-25 ENCOUNTER — Other Ambulatory Visit: Payer: Self-pay | Admitting: Radiology

## 2016-06-25 ENCOUNTER — Inpatient Hospital Stay (HOSPITAL_COMMUNITY): Payer: Medicare HMO

## 2016-06-25 DIAGNOSIS — Z888 Allergy status to other drugs, medicaments and biological substances status: Secondary | ICD-10-CM

## 2016-06-25 DIAGNOSIS — Z981 Arthrodesis status: Secondary | ICD-10-CM

## 2016-06-25 DIAGNOSIS — Z9689 Presence of other specified functional implants: Secondary | ICD-10-CM

## 2016-06-25 DIAGNOSIS — Z885 Allergy status to narcotic agent status: Secondary | ICD-10-CM

## 2016-06-25 DIAGNOSIS — R7881 Bacteremia: Secondary | ICD-10-CM

## 2016-06-25 DIAGNOSIS — Z8249 Family history of ischemic heart disease and other diseases of the circulatory system: Secondary | ICD-10-CM

## 2016-06-25 DIAGNOSIS — R140 Abdominal distension (gaseous): Secondary | ICD-10-CM

## 2016-06-25 LAB — BLOOD CULTURE ID PANEL (REFLEXED)

## 2016-06-25 LAB — BASIC METABOLIC PANEL
Anion gap: 10 (ref 5–15)
BUN: 18 mg/dL (ref 6–20)
CO2: 26 mmol/L (ref 22–32)
Calcium: 8.7 mg/dL — ABNORMAL LOW (ref 8.9–10.3)
Chloride: 92 mmol/L — ABNORMAL LOW (ref 101–111)
Creatinine, Ser: 1.65 mg/dL — ABNORMAL HIGH (ref 0.44–1.00)
GFR calc Af Amer: 35 mL/min — ABNORMAL LOW (ref >60)
GFR calc non Af Amer: 30 mL/min — ABNORMAL LOW (ref >60)
Glucose, Bld: 113 mg/dL — ABNORMAL HIGH (ref 65–99)
Potassium: 3.9 mmol/L (ref 3.5–5.1)
Sodium: 128 mmol/L — ABNORMAL LOW (ref 135–145)

## 2016-06-25 LAB — C-REACTIVE PROTEIN: CRP: 37 mg/dL — ABNORMAL HIGH (ref <1.0)

## 2016-06-25 LAB — SEDIMENTATION RATE: Sed Rate: 110 mm/hr — ABNORMAL HIGH (ref 0–22)

## 2016-06-25 LAB — SODIUM, URINE, RANDOM: Sodium, Ur: <10 mmol/L

## 2016-06-25 LAB — CREATININE, URINE, RANDOM: Creatinine, Urine: 91.25 mg/dL

## 2016-06-25 MED ORDER — HYDROMORPHONE HCL 1 MG/ML IJ SOLN: 1.0000 mg | Freq: Once | INTRAMUSCULAR | Status: DC

## 2016-06-25 MED ORDER — POLYETHYLENE GLYCOL 3350 17 G PO PACK
17.0000 g | PACK | Freq: Two times a day (BID) | ORAL | Status: DC
  Administered 2016-06-25 – 2016-07-27 (×14): 17 g via ORAL
  Filled 2016-06-25 (×35): qty 1

## 2016-06-25 MED ORDER — HYDROCODONE-ACETAMINOPHEN 10-325 MG PO TABS
1.0000 | ORAL_TABLET | Freq: Four times a day (QID) | ORAL | Status: DC | PRN
  Administered 2016-06-25 – 2016-07-07 (×32): 1 via ORAL
  Filled 2016-06-25 (×34): qty 1

## 2016-06-25 MED ORDER — DEXTROSE 5 % IV SOLN
2.0000 g | INTRAVENOUS | Status: DC
  Administered 2016-06-25: 2 g via INTRAVENOUS
  Filled 2016-06-25 (×2): qty 2

## 2016-06-25 MED ORDER — IPRATROPIUM-ALBUTEROL 0.5-2.5 (3) MG/3ML IN SOLN
3.0000 mL | Freq: Four times a day (QID) | RESPIRATORY_TRACT | Status: DC
  Administered 2016-06-25 (×2): 3 mL via RESPIRATORY_TRACT
  Filled 2016-06-25 (×2): qty 3

## 2016-06-25 MED ORDER — SENNOSIDES-DOCUSATE SODIUM 8.6-50 MG PO TABS
2.0000 | ORAL_TABLET | Freq: Two times a day (BID) | ORAL | Status: DC
  Administered 2016-06-25 – 2016-07-04 (×11): 2 via ORAL
  Administered 2016-07-05: 1 via ORAL
  Administered 2016-07-06 – 2016-07-28 (×19): 2 via ORAL
  Administered 2016-07-30: 1 via ORAL
  Administered 2016-07-31: 2 via ORAL
  Filled 2016-06-25 (×50): qty 2

## 2016-06-25 MED ORDER — GADOBENATE DIMEGLUMINE 529 MG/ML IV SOLN
8.0000 mL | Freq: Once | INTRAVENOUS | Status: AC | PRN
Start: 2016-06-25 — End: 2016-06-25
  Administered 2016-06-25: 8 mL via INTRAVENOUS

## 2016-06-25 MED ORDER — FUROSEMIDE 10 MG/ML IJ SOLN
40.0000 mg | Freq: Once | INTRAMUSCULAR | Status: AC
  Administered 2016-06-25: 40 mg via INTRAVENOUS
  Filled 2016-06-25: qty 4

## 2016-06-25 NOTE — Progress Notes (Addendum)
Radiologist, Dr. Benard Rink, called with MRI Lspine with contrast results. Per Dr. Benard Rink, the fluid collections have worsened since MRI without contrast on 5/4. NP called RN to ask about pt. Per RN, pt has been in MRI the majority of shift. However, pt is somewhat confused (which has been charted during hospitalization) and is able to weight bear with assistance to bathroom. VS 99.36F HR 90, RR 18. BP 130s. O2 sat normal.  NP paged Dr. Jordan Likes with [facility] with results. Dr. Jordan Likes reviewed chart and MRI. Dr. Jordan Likes will refer results to Dr. Wynetta Emery in am who did the pt's surgery in March and has been following in the hospital.  [facility], NP [facility] Addendum: Pt returned from MRI in severe pain. Gave Norco early as pt's family refused to let her have ordered Dilaudid. Later, pt converted to AF with RVR, ventricular rate 115. Pt has a hx AF and is on Eliquis. Metoprolol 5mg  IV given. Pt asymptomatic with AF. Will follow rate and rhythm. [facility], NP [facility]

## 2016-06-25 NOTE — Progress Notes (Signed)
PHARMACY - PHYSICIAN COMMUNICATION CRITICAL VALUE ALERT - BLOOD CULTURE IDENTIFICATION (BCID)  Results for orders placed or performed during the hospital encounter of 06/21/16  Blood Culture ID Panel (Reflexed) (Collected: 06/24/2016 10:58 AM)  Result Value Ref Range   Enterococcus species NOT DETECTED NOT DETECTED   Listeria monocytogenes NOT DETECTED NOT DETECTED   Staphylococcus species NOT DETECTED NOT DETECTED   Staphylococcus aureus NOT DETECTED NOT DETECTED   Streptococcus species NOT DETECTED NOT DETECTED   Streptococcus agalactiae NOT DETECTED NOT DETECTED   Streptococcus pneumoniae NOT DETECTED NOT DETECTED   Streptococcus pyogenes NOT DETECTED NOT DETECTED   Acinetobacter baumannii NOT DETECTED NOT DETECTED   Enterobacteriaceae species DETECTED (A) NOT DETECTED   Enterobacter cloacae complex NOT DETECTED NOT DETECTED   Escherichia coli DETECTED (A) NOT DETECTED   Klebsiella oxytoca NOT DETECTED NOT DETECTED   Klebsiella pneumoniae NOT DETECTED NOT DETECTED   Proteus species NOT DETECTED NOT DETECTED   Serratia marcescens NOT DETECTED NOT DETECTED   Carbapenem resistance NOT DETECTED NOT DETECTED   Haemophilus influenzae NOT DETECTED NOT DETECTED   Neisseria meningitidis NOT DETECTED NOT DETECTED   Pseudomonas aeruginosa NOT DETECTED NOT DETECTED   Candida albicans NOT DETECTED NOT DETECTED   Candida glabrata NOT DETECTED NOT DETECTED   Candida krusei NOT DETECTED NOT DETECTED   Candida parapsilosis NOT DETECTED NOT DETECTED   Candida tropicalis NOT DETECTED NOT DETECTED    Name of physician (or Provider) Contacted: Dr. Blake Divine  Changes to prescribed antibiotics required: no change needed, on Zosyn currently  Loura Back, PharmD, [facility] Clinical Pharmacist Main pharmacy - [phone] 06/25/2016 8:12 AM
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
## 2016-06-25 NOTE — Progress Notes (Signed)
Patient with history of decompressive lumbar laminectomy 04/25/16, and now s/p CABG 05/30/16, presented to [facility] 5/3 with worsening back pain, fevers, and malaise.   MR 5/3 showed: 1. Posterior instrumented fusion, interbody fusion, and laminectomy at L4 through S1. 2. Fluid collection within the laminectomy bed extending into the lateral epidural spaces may represent a postoperative seroma or possibly pseudomeningocele. Given the absence of bone marrow edema in the surrounding spine this is less likely to represent an abscess. The fluid collection exerts mass effect on the thecal sac with mild stenosis. 3. Mild progression of degenerative changes at the L3-4 level with mild canal stenosis. Otherwise stable lumbar spondylosis. 4. Multilevel mild foraminal narrowing with moderate bilateral foraminal narrowing at L4-5 and severe bilateral foraminal narrowing at L5-S1.  IR consulted for possible aspiration of the fluid collection.  Dr. Fredia Sorrow reviewed imaging 06/24/16 and felt fluid collection was not concretely suggestive of an infectious process.  He recommended MR with contrast for better visualization and characterization if aspiration still warranted by medical team.   Loyce Dys, MS RD PA-C 10:40 AM

## 2016-06-25 NOTE — Progress Notes (Addendum)
[address]       [place] [address]             [phone]         Subjective: Feels poorly, weak and diaphoetic  Objective: Vital signs in last 24 hours: Temp:  [97.7 F (36.5 C)-99.2 F (37.3 C)] 99.2 F (37.3 C) (05/07 0306) Pulse Rate:  [71-93] 93 (05/07 0306) Cardiac Rhythm: Normal sinus rhythm (05/07 0700) Resp:  [18] 18 (05/07 0306) BP: (106-130)/(51-58) 130/58 (05/07 0306) SpO2:  [97 %-99 %] 97 % (05/07 0306) Weight:  [186 lb 4.8 oz (84.5 kg)] 186 lb 4.8 oz (84.5 kg) (05/07 0304)  Hemodynamic parameters for last 24 hours:    Intake/Output from previous day: 05/06 0701 - 05/07 0700 In: -  Out: 1700 [Urine:1700] Intake/Output this shift: No intake/output data recorded.  General appearance: fatigued and no distress Heart: regular rate and rhythm Lungs: upper airway congestion Abdomen: benign Extremities: + Pitting edema Wound: incis healing well  Lab Results:  Recent Labs  06/23/16 0307  WBC 9.6  HGB 8.4*  HCT 26.1*  PLT 95*   BMET:  Recent Labs  06/23/16 0307 06/25/16 0306  NA 126* 128*  K 3.4* 3.9  CL 92* 92*  CO2 24 26  GLUCOSE 120* 113*  BUN 21* 18  CREATININE 1.22* 1.65*  CALCIUM 8.0* 8.7*    PT/INR: No results for input(s): LABPROT, INR in the last 72 hours. ABG    Component Value Date/Time   PHART 7.301 (L) 05/30/2016 2139   HCO3 22.0 05/30/2016 2139   TCO2 24 05/31/2016 1802   ACIDBASEDEF 4.0 (H) 05/30/2016 2139   O2SAT 96.0 05/30/2016 2139   CBG (last 3)  No results for input(s): GLUCAP in the last 72 hours.  Meds Scheduled Meds: . apixaban  5 mg Oral BID  . aspirin EC  81 mg Oral Daily  . atorvastatin  80 mg Oral q1800  . cholecalciferol  2,000 Units Oral QPC breakfast  . cycloSPORINE  1 drop Both Eyes BID  . levothyroxine  112 mcg Oral QAC breakfast  . magnesium oxide  200 mg Oral QPC breakfast  . metoprolol tartrate  37.5 mg Oral BID  . multivitamin with minerals  1 tablet Oral Daily    . pantoprazole  40 mg Oral Daily  . sodium chloride flush  3 mL Intravenous Q12H   Continuous Infusions: . piperacillin-tazobactam (ZOSYN)  IV 3.375 g (06/25/16 0319)  . vancomycin Stopped (06/24/16 2256)   PRN Meds:.acetaminophen **OR** acetaminophen, butalbital-acetaminophen-caffeine, cyclobenzaprine, HYDROcodone-acetaminophen, HYDROmorphone (DILAUDID) injection, levalbuterol, ondansetron **OR** ondansetron (ZOFRAN) IV, zolpidem  Xrays Dg Chest Port 1 View  Result Date: 06/24/2016 CLINICAL DATA:  Shortness of breath. EXAM: PORTABLE CHEST 1 VIEW COMPARISON:  Radiograph of Jun 21, 2016. FINDINGS: Stable cardiomegaly. Status post coronary artery bypass graft. No pneumothorax or pleural effusion is noted. Stable minimal atelectasis seen in lingular and right midlung regions. Bony thorax is unremarkable. IMPRESSION: Stable minimal bilateral subsegmental atelectasis. Electronically Signed   By: Lupita Raider, M.D.   On: 06/24/2016 15:03    Results for orders placed or performed during the hospital encounter of 06/21/16  Culture, blood (x 2)     Status: Abnormal   Collection Time: 06/21/16  7:20 PM  Result Value Ref Range Status   Specimen Description BLOOD RIGHT HAND  Final   Special Requests   Final    BOTTLES DRAWN AEROBIC AND ANAEROBIC Blood Culture adequate volume  Culture  Setup Time   Final    GRAM NEGATIVE RODS IN BOTH AEROBIC AND ANAEROBIC BOTTLES CRITICAL RESULT CALLED TO, READ BACK BY AND VERIFIED WITH: N. BATCHELDER PHARM 06/22/16 0838 BEAMJ    Culture (A)  Final    ESCHERICHIA COLI SUSCEPTIBILITIES PERFORMED ON PREVIOUS CULTURE WITHIN THE LAST 5 DAYS.    Report Status 06/24/2016 FINAL  Final  Culture, blood (x 2)     Status: Abnormal   Collection Time: 06/21/16  7:23 PM  Result Value Ref Range Status   Specimen Description BLOOD LEFT ANTECUBITAL  Final   Special Requests   Final    BOTTLES DRAWN AEROBIC AND ANAEROBIC Blood Culture adequate volume   Culture  Setup Time    Final    GRAM NEGATIVE RODS IN BOTH AEROBIC AND ANAEROBIC BOTTLES CRITICAL RESULT CALLED TO, READ BACK BY AND VERIFIED WITH: N BATCHELDER PHARM 05.4.18 0838 BEAMJ    Culture ESCHERICHIA COLI (A)  Final   Report Status 06/24/2016 FINAL  Final   Organism ID, Bacteria ESCHERICHIA COLI  Final      Susceptibility   Escherichia coli - MIC*    AMPICILLIN <=2 SENSITIVE Sensitive     CEFAZOLIN <=4 SENSITIVE Sensitive     CEFEPIME <=1 SENSITIVE Sensitive     CEFTAZIDIME <=1 SENSITIVE Sensitive     CEFTRIAXONE <=1 SENSITIVE Sensitive     CIPROFLOXACIN <=0.25 SENSITIVE Sensitive     GENTAMICIN <=1 SENSITIVE Sensitive     IMIPENEM <=0.25 SENSITIVE Sensitive     TRIMETH/SULFA <=20 SENSITIVE Sensitive     AMPICILLIN/SULBACTAM <=2 SENSITIVE Sensitive     PIP/TAZO <=4 SENSITIVE Sensitive     Extended ESBL NEGATIVE Sensitive     * ESCHERICHIA COLI  Blood Culture ID Panel (Reflexed)     Status: Abnormal   Collection Time: 06/21/16  7:23 PM  Result Value Ref Range Status   Enterococcus species NOT DETECTED NOT DETECTED Final   Listeria monocytogenes NOT DETECTED NOT DETECTED Final   Staphylococcus species NOT DETECTED NOT DETECTED Final   Staphylococcus aureus NOT DETECTED NOT DETECTED Final   Streptococcus species NOT DETECTED NOT DETECTED Final   Streptococcus agalactiae NOT DETECTED NOT DETECTED Final   Streptococcus pneumoniae NOT DETECTED NOT DETECTED Final   Streptococcus pyogenes NOT DETECTED NOT DETECTED Final   Acinetobacter baumannii NOT DETECTED NOT DETECTED Final   Enterobacteriaceae species DETECTED (A) NOT DETECTED Final    Comment: Enterobacteriaceae represent a large family of gram-negative bacteria, not a single organism. CRITICAL RESULT CALLED TO, READ BACK BY AND VERIFIED WITH: N. BATCHELDER PHARM 06/22/2016 0838 BEAM    Enterobacter cloacae complex NOT DETECTED NOT DETECTED Final   Escherichia coli DETECTED (A) NOT DETECTED Final    Comment: CRITICAL RESULT CALLED TO, READ  BACK BY AND VERIFIED WITH: N. BATCHELDER PHARM 06/22/2016 0838 BEAMJ    Klebsiella oxytoca NOT DETECTED NOT DETECTED Final   Klebsiella pneumoniae NOT DETECTED NOT DETECTED Final   Proteus species NOT DETECTED NOT DETECTED Final   Serratia marcescens NOT DETECTED NOT DETECTED Final   Carbapenem resistance NOT DETECTED NOT DETECTED Final   Haemophilus influenzae NOT DETECTED NOT DETECTED Final   Neisseria meningitidis NOT DETECTED NOT DETECTED Final   Pseudomonas aeruginosa NOT DETECTED NOT DETECTED Final   Candida albicans NOT DETECTED NOT DETECTED Final   Candida glabrata NOT DETECTED NOT DETECTED Final   Candida krusei NOT DETECTED NOT DETECTED Final   Candida parapsilosis NOT DETECTED NOT DETECTED Final   Candida tropicalis NOT  DETECTED NOT DETECTED Final  Culture, Urine     Status: Abnormal   Collection Time: 06/21/16  7:51 PM  Result Value Ref Range Status   Specimen Description URINE, CLEAN CATCH  Final   Special Requests NONE  Final   Culture >=100,000 COLONIES/mL ESCHERICHIA COLI (A)  Final   Report Status 06/23/2016 FINAL  Final   Organism ID, Bacteria ESCHERICHIA COLI (A)  Final      Susceptibility   Escherichia coli - MIC*    AMPICILLIN <=2 SENSITIVE Sensitive     CEFAZOLIN <=4 SENSITIVE Sensitive     CEFTRIAXONE <=1 SENSITIVE Sensitive     CIPROFLOXACIN <=0.25 SENSITIVE Sensitive     GENTAMICIN <=1 SENSITIVE Sensitive     IMIPENEM <=0.25 SENSITIVE Sensitive     NITROFURANTOIN <=16 SENSITIVE Sensitive     TRIMETH/SULFA <=20 SENSITIVE Sensitive     AMPICILLIN/SULBACTAM <=2 SENSITIVE Sensitive     PIP/TAZO <=4 SENSITIVE Sensitive     Extended ESBL NEGATIVE Sensitive     * >=100,000 COLONIES/mL ESCHERICHIA COLI  Culture, blood (Routine X 2) w Reflex to ID Panel     Status: None (Preliminary result)   Collection Time: 06/24/16 10:58 AM  Result Value Ref Range Status   Specimen Description BLOOD LEFT ARM  Final   Special Requests IN PEDIATRIC BOTTLE Blood Culture  adequate volume  Final   Culture  Setup Time   Final    GRAM NEGATIVE RODS Organism ID to follow IN PEDIATRIC BOTTLE CRITICAL RESULT CALLED TO, READ BACK BY AND VERIFIED WITH: Joette Catching PHARMD, AT [phone] 06/25/16 BY D. VANHOOK    Culture GRAM NEGATIVE RODS  Final   Report Status PENDING  Incomplete  Blood Culture ID Panel (Reflexed)     Status: Abnormal   Collection Time: 06/24/16 10:58 AM  Result Value Ref Range Status   Enterococcus species NOT DETECTED NOT DETECTED Final   Listeria monocytogenes NOT DETECTED NOT DETECTED Final   Staphylococcus species NOT DETECTED NOT DETECTED Final   Staphylococcus aureus NOT DETECTED NOT DETECTED Final   Streptococcus species NOT DETECTED NOT DETECTED Final   Streptococcus agalactiae NOT DETECTED NOT DETECTED Final   Streptococcus pneumoniae NOT DETECTED NOT DETECTED Final   Streptococcus pyogenes NOT DETECTED NOT DETECTED Final   Acinetobacter baumannii NOT DETECTED NOT DETECTED Final   Enterobacteriaceae species DETECTED (A) NOT DETECTED Final    Comment: Enterobacteriaceae represent a large family of gram-negative bacteria, not a single organism. CRITICAL RESULT CALLED TO, READ BACK BY AND VERIFIED WITH: Joette Catching PHARMD, AT [phone] 06/25/16 BY D. VANHOOK    Enterobacter cloacae complex NOT DETECTED NOT DETECTED Final   Escherichia coli DETECTED (A) NOT DETECTED Final    Comment: CRITICAL RESULT CALLED TO, READ BACK BY AND VERIFIED WITH: Joette Catching PHARMD, AT [phone] 06/25/16 BY D. VANHOOK    Klebsiella oxytoca NOT DETECTED NOT DETECTED Final   Klebsiella pneumoniae NOT DETECTED NOT DETECTED Final   Proteus species NOT DETECTED NOT DETECTED Final   Serratia marcescens NOT DETECTED NOT DETECTED Final   Carbapenem resistance NOT DETECTED NOT DETECTED Final   Haemophilus influenzae NOT DETECTED NOT DETECTED Final   Neisseria meningitidis NOT DETECTED NOT DETECTED Final   Pseudomonas aeruginosa NOT DETECTED NOT DETECTED Final   Candida albicans NOT  DETECTED NOT DETECTED Final   Candida glabrata NOT DETECTED NOT DETECTED Final   Candida krusei NOT DETECTED NOT DETECTED Final   Candida parapsilosis NOT DETECTED NOT DETECTED Final   Candida tropicalis NOT DETECTED  NOT DETECTED Final    Assessment/Plan:  1 stable, but feels poorly, tmax 99.2- conts current abx, may be able to change- defer to Medicine service 2 renal fxn worse today, edema increasing , may need to consider more diuretic 3 NS has given recs 4 cardiac surgery incisions without evid of infection       LOS: 4 days    GOLD,WAYNE E 06/25/2016 Patient seen and examined. Agree with above c/o left upper abdominal pain She is still growing e coli from blood. Also growing from urine Concerned about possible pyelo/ obstruction Agree with plan for renal US No sign of sternal wound or leg wound infection  Viviann Spare C. Dorris Fetch, MD [facility] [phone]

## 2016-06-25 NOTE — Progress Notes (Signed)
Pharmacy Antibiotic Note  Erin [name] is a 71 y.o. female admitted on 06/21/2016 with sepsis.  Pharmacy previously managing vancomycin and Zosyn dosing.  Afebrile with normal WBC. Blood cultures persistently positive on Zosyn.  Pharmacy asked to switch to ceftriaxone.   Plan: - Ceftriaxone 2g IV q 24 hrs. - Monitor renal fxn, clinical progress.  Height: 5\' 3"  (160 cm) Weight: 186 lb 4.8 oz (84.5 kg) IBW/kg (Calculated) : 52.4  Temp (24hrs), Avg:98.5 F (36.9 C), Min:97.7 F (36.5 C), Max:99.2 F (37.3 C)   Recent Labs Lab 06/20/16 1139 06/21/16 1922 06/21/16 2240 06/22/16 0158 06/22/16 0225 06/23/16 0307 06/25/16 0306  WBC 19.0* 10.2  --  9.8  --  9.6  --   CREATININE 1.83* 1.60*  --  1.48*  --  1.22* 1.65*  LATICACIDVEN  --  1.2 2.2*  --  1.3  --   --     Estimated Creatinine Clearance: 31.7 mL/min (A) (by C-G formula based on SCr of 1.65 mg/dL (H)).    Allergies  Allergen Reactions  . Ace Inhibitors Swelling    ACE stopped after pt seen in ED with facial swelling- allergy testing pending  . Morphine And Related   . Codeine Nausea And Vomiting     Vanc 5/3 >>5/7 Zosyn 5/3 >>5/7 Cefriaxone 5/7 >  5/5 Blood x2 - Ecoli 5/3 Blood x 2 - E coli pan sensitive  5/3 BCID - E coli 5/3 urine - E.coli pan sensitive  5/3 Influenza PCR negative   Tad Moore, BCPS  Clinical Pharmacist Pager [phone]  06/25/2016 5:08 PM
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
## 2016-06-25 NOTE — Progress Notes (Signed)
Patient ID: Erin [name], female   DOB: Mar 02, 1945, 71 y.o.   MRN: [id number]  Late entry note:  Saw the patient on rounds this morning and she is doing very well back pain is manageable she had a decent weekend although she was possibly over medicated yesterday as she was uncomfortable in bed more confused Saturday she was very active in appropriate this morning she appears appropriate confusion is still present but improved per the son from yesterday.  Neurologic exam is stable.  Continue antibiotics continue observation.

## 2016-06-25 NOTE — Care Management Important Message (Signed)
Important Message  Patient Details  Name: Erin [name] MRN: [id number] Date of Birth: 1945-06-05   Medicare Important Message Given:  Yes    [name] 06/25/2016, 1:51 PM

## 2016-06-26 ENCOUNTER — Other Ambulatory Visit: Payer: Self-pay | Admitting: Thoracic Surgery (Cardiothoracic Vascular Surgery)

## 2016-06-26 ENCOUNTER — Encounter (HOSPITAL_COMMUNITY): Payer: Self-pay | Admitting: Certified Registered Nurse Anesthetist

## 2016-06-26 ENCOUNTER — Other Ambulatory Visit: Payer: Self-pay | Admitting: Neurosurgery

## 2016-06-26 ENCOUNTER — Encounter (HOSPITAL_COMMUNITY)
Admission: AD | Disposition: A | Discharge status: Skilled Nursing Facility | Payer: Self-pay | Source: Ambulatory Visit | Attending: Family Medicine

## 2016-06-26 ENCOUNTER — Inpatient Hospital Stay (HOSPITAL_COMMUNITY): Payer: Medicare HMO | Admitting: Certified Registered Nurse Anesthetist

## 2016-06-26 DIAGNOSIS — R410 Disorientation, unspecified: Secondary | ICD-10-CM

## 2016-06-26 DIAGNOSIS — A4151 Sepsis due to Escherichia coli [E. coli]: Principal | ICD-10-CM

## 2016-06-26 DIAGNOSIS — Z951 Presence of aortocoronary bypass graft: Secondary | ICD-10-CM

## 2016-06-26 HISTORY — PX: LUMBAR WOUND DEBRIDEMENT: SHX1988

## 2016-06-26 LAB — BASIC METABOLIC PANEL
Anion gap: 12 (ref 5–15)
Anion gap: 10 (ref 5–15)
BUN: 24 mg/dL — ABNORMAL HIGH (ref 6–20)
BUN: 27 mg/dL — ABNORMAL HIGH (ref 6–20)
CO2: 24 mmol/L (ref 22–32)
CO2: 24 mmol/L (ref 22–32)
Calcium: 8.8 mg/dL — ABNORMAL LOW (ref 8.9–10.3)
Calcium: 8.2 mg/dL — ABNORMAL LOW (ref 8.9–10.3)
Chloride: 92 mmol/L — ABNORMAL LOW (ref 101–111)
Chloride: 91 mmol/L — ABNORMAL LOW (ref 101–111)
Creatinine, Ser: 1.94 mg/dL — ABNORMAL HIGH (ref 0.44–1.00)
Creatinine, Ser: 1.97 mg/dL — ABNORMAL HIGH (ref 0.44–1.00)
GFR calc Af Amer: 29 mL/min — ABNORMAL LOW (ref >60)
GFR calc Af Amer: 28 mL/min — ABNORMAL LOW (ref >60)
GFR calc non Af Amer: 25 mL/min — ABNORMAL LOW (ref >60)
GFR calc non Af Amer: 24 mL/min — ABNORMAL LOW (ref >60)
Glucose, Bld: 119 mg/dL — ABNORMAL HIGH (ref 65–99)
Glucose, Bld: 159 mg/dL — ABNORMAL HIGH (ref 65–99)
Potassium: 3.7 mmol/L (ref 3.5–5.1)
Potassium: 4.4 mmol/L (ref 3.5–5.1)
Sodium: 128 mmol/L — ABNORMAL LOW (ref 135–145)
Sodium: 125 mmol/L — ABNORMAL LOW (ref 135–145)

## 2016-06-26 LAB — GLUCOSE, CAPILLARY
Glucose-Capillary: 113 mg/dL — ABNORMAL HIGH (ref 65–99)
Glucose-Capillary: 159 mg/dL — ABNORMAL HIGH (ref 65–99)

## 2016-06-26 LAB — CBC
HCT: 28.8 % — ABNORMAL LOW (ref 36.0–46.0)
HCT: 23.3 % — ABNORMAL LOW (ref 36.0–46.0)
Hemoglobin: 9.2 g/dL — ABNORMAL LOW (ref 12.0–15.0)
Hemoglobin: 7.4 g/dL — ABNORMAL LOW (ref 12.0–15.0)
MCH: 26.1 pg (ref 26.0–34.0)
MCH: 26 pg (ref 26.0–34.0)
MCHC: 31.9 g/dL (ref 30.0–36.0)
MCHC: 31.8 g/dL (ref 30.0–36.0)
MCV: 81.8 fL (ref 78.0–100.0)
MCV: 81.8 fL (ref 78.0–100.0)
Platelets: 195 10*3/uL (ref 150–400)
Platelets: 173 10*3/uL (ref 150–400)
RBC: 3.52 MIL/uL — ABNORMAL LOW (ref 3.87–5.11)
RBC: 2.85 MIL/uL — ABNORMAL LOW (ref 3.87–5.11)
RDW: 18.1 % — ABNORMAL HIGH (ref 11.5–15.5)
RDW: 18.1 % — ABNORMAL HIGH (ref 11.5–15.5)
WBC: 19.4 10*3/uL — ABNORMAL HIGH (ref 4.0–10.5)
WBC: 21.1 10*3/uL — ABNORMAL HIGH (ref 4.0–10.5)

## 2016-06-26 LAB — CULTURE, BLOOD (ROUTINE X 2): Special Requests: ADEQUATE

## 2016-06-26 LAB — PROTIME-INR
INR: 2.06
Prothrombin Time: 23.5 seconds — ABNORMAL HIGH (ref 11.4–15.2)

## 2016-06-26 LAB — MRSA PCR SCREENING: MRSA by PCR: NEGATIVE

## 2016-06-26 LAB — LACTIC ACID, PLASMA
Lactic Acid, Venous: 0.8 mmol/L (ref 0.5–1.9)
Lactic Acid, Venous: 1.1 mmol/L (ref 0.5–1.9)

## 2016-06-26 LAB — MAGNESIUM
Magnesium: 1.6 mg/dL — ABNORMAL LOW (ref 1.7–2.4)
Magnesium: 1.9 mg/dL (ref 1.7–2.4)

## 2016-06-26 LAB — TROPONIN I: Troponin I: 0.04 ng/mL (ref <0.03)

## 2016-06-26 LAB — PHOSPHORUS: Phosphorus: 5.5 mg/dL — ABNORMAL HIGH (ref 2.5–4.6)

## 2016-06-26 SURGERY — LUMBAR WOUND DEBRIDEMENT
Anesthesia: General

## 2016-06-26 MED ORDER — SUGAMMADEX SODIUM 200 MG/2ML IV SOLN
INTRAVENOUS | Status: DC | PRN
  Administered 2016-06-26: 200 mg via INTRAVENOUS

## 2016-06-26 MED ORDER — 0.9 % SODIUM CHLORIDE (POUR BTL) OPTIME
TOPICAL | Status: DC | PRN
  Administered 2016-06-26 (×2): 1000 mL

## 2016-06-26 MED ORDER — VANCOMYCIN HCL 1000 MG IV SOLR
INTRAVENOUS | Status: DC | PRN
  Administered 2016-06-26: 1000 mg via INTRAVENOUS

## 2016-06-26 MED ORDER — ALUM & MAG HYDROXIDE-SIMETH 200-200-20 MG/5ML PO SUSP
30.0000 mL | Freq: Four times a day (QID) | ORAL | Status: DC | PRN
  Administered 2016-07-20 – 2016-07-27 (×3): 30 mL via ORAL
  Filled 2016-06-26 (×3): qty 30

## 2016-06-26 MED ORDER — FENTANYL CITRATE (PF) 100 MCG/2ML IJ SOLN
INTRAMUSCULAR | Status: DC | PRN
  Administered 2016-06-26: 50 ug via INTRAVENOUS

## 2016-06-26 MED ORDER — SODIUM CHLORIDE 0.9% FLUSH
3.0000 mL | Freq: Two times a day (BID) | INTRAVENOUS | Status: DC
  Administered 2016-06-27 – 2016-08-01 (×20): 3 mL via INTRAVENOUS

## 2016-06-26 MED ORDER — SODIUM CHLORIDE 0.9 % IV SOLN
250.0000 mL | INTRAVENOUS | Status: DC
  Administered 2016-07-04: 13:00:00 via INTRAVENOUS

## 2016-06-26 MED ORDER — ONDANSETRON HCL 4 MG/2ML IJ SOLN: 4.0000 mg | Freq: Four times a day (QID) | INTRAMUSCULAR | Status: DC | PRN

## 2016-06-26 MED ORDER — SODIUM CHLORIDE 0.9 % IR SOLN
Status: DC | PRN
  Administered 2016-06-26 (×2)

## 2016-06-26 MED ORDER — THROMBIN 5000 UNITS EX SOLR
CUTANEOUS | Status: AC
  Filled 2016-06-26: qty 15000

## 2016-06-26 MED ORDER — LIDOCAINE HCL (CARDIAC) 20 MG/ML IV SOLN
INTRAVENOUS | Status: DC | PRN
  Administered 2016-06-26: 60 mg via INTRAVENOUS

## 2016-06-26 MED ORDER — ACETAMINOPHEN 650 MG RE SUPP: 650.0000 mg | RECTAL | Status: DC | PRN

## 2016-06-26 MED ORDER — BUPIVACAINE HCL (PF) 0.25 % IJ SOLN
INTRAMUSCULAR | Status: DC | PRN
  Administered 2016-06-26: 8 mL

## 2016-06-26 MED ORDER — LACTATED RINGERS IV SOLN
INTRAVENOUS | Status: DC | PRN
  Administered 2016-06-26: 15:00:00 via INTRAVENOUS

## 2016-06-26 MED ORDER — VANCOMYCIN HCL IN DEXTROSE 1-5 GM/200ML-% IV SOLN
1000.0000 mg | INTRAVENOUS | Status: DC
  Administered 2016-06-27: 1000 mg via INTRAVENOUS
  Filled 2016-06-26: qty 200

## 2016-06-26 MED ORDER — MENTHOL 3 MG MT LOZG
1.0000 | LOZENGE | OROMUCOSAL | Status: DC | PRN
  Administered 2016-07-01: 3 mg via ORAL
  Filled 2016-06-26: qty 9

## 2016-06-26 MED ORDER — PANTOPRAZOLE SODIUM 40 MG IV SOLR
40.0000 mg | Freq: Every day | INTRAVENOUS | Status: DC
  Administered 2016-06-26: 40 mg via INTRAVENOUS
  Filled 2016-06-26: qty 40

## 2016-06-26 MED ORDER — THROMBIN 5000 UNITS EX SOLR
CUTANEOUS | Status: DC | PRN
  Administered 2016-06-26: 10000 [IU] via TOPICAL

## 2016-06-26 MED ORDER — ONDANSETRON HCL 4 MG/2ML IJ SOLN
INTRAMUSCULAR | Status: DC | PRN
  Administered 2016-06-26: 4 mg via INTRAVENOUS

## 2016-06-26 MED ORDER — LIDOCAINE-EPINEPHRINE 1 %-1:100000 IJ SOLN
INTRAMUSCULAR | Status: AC
  Filled 2016-06-26: qty 1

## 2016-06-26 MED ORDER — HEMOSTATIC AGENTS (NO CHARGE) OPTIME
TOPICAL | Status: DC | PRN
  Administered 2016-06-26: 1 via TOPICAL

## 2016-06-26 MED ORDER — IPRATROPIUM-ALBUTEROL 0.5-2.5 (3) MG/3ML IN SOLN: 3.0000 mL | Freq: Four times a day (QID) | RESPIRATORY_TRACT | Status: DC | PRN

## 2016-06-26 MED ORDER — POTASSIUM CHLORIDE IN NACL 20-0.9 MEQ/L-% IV SOLN
INTRAVENOUS | Status: DC
  Filled 2016-06-26: qty 1000

## 2016-06-26 MED ORDER — HYDROMORPHONE HCL 1 MG/ML IJ SOLN: 0.5000 mg | INTRAMUSCULAR | Status: DC | PRN

## 2016-06-26 MED ORDER — VANCOMYCIN HCL IN DEXTROSE 1-5 GM/200ML-% IV SOLN
INTRAVENOUS | Status: AC
  Filled 2016-06-26: qty 200

## 2016-06-26 MED ORDER — SODIUM CHLORIDE 0.9% FLUSH
3.0000 mL | INTRAVENOUS | Status: DC | PRN
  Administered 2016-07-15: 3 mL via INTRAVENOUS
  Filled 2016-06-26: qty 3

## 2016-06-26 MED ORDER — DEXTROSE 5 % IV SOLN
2.0000 g | Freq: Two times a day (BID) | INTRAVENOUS | Status: DC
  Administered 2016-06-26 – 2016-07-07 (×22): 2 g via INTRAVENOUS
  Filled 2016-06-26 (×25): qty 2

## 2016-06-26 MED ORDER — CYCLOBENZAPRINE HCL 10 MG PO TABS: 10.0000 mg | ORAL_TABLET | Freq: Three times a day (TID) | ORAL | Status: DC | PRN

## 2016-06-26 MED ORDER — ONDANSETRON HCL 4 MG/2ML IJ SOLN
INTRAMUSCULAR | Status: AC
  Filled 2016-06-26: qty 2

## 2016-06-26 MED ORDER — PHENYLEPHRINE HCL 10 MG/ML IJ SOLN
INTRAVENOUS | Status: DC | PRN
  Administered 2016-06-26: 40 ug/min via INTRAVENOUS

## 2016-06-26 MED ORDER — WHITE PETROLATUM GEL: Status: DC | PRN

## 2016-06-26 MED ORDER — VANCOMYCIN HCL 1000 MG IV SOLR
INTRAVENOUS | Status: DC | PRN
  Administered 2016-06-26: 1000 mg via TOPICAL

## 2016-06-26 MED ORDER — METOPROLOL TARTRATE 5 MG/5ML IV SOLN
INTRAVENOUS | Status: DC | PRN
  Administered 2016-06-26 (×2): 2 mg via INTRAVENOUS

## 2016-06-26 MED ORDER — BUPIVACAINE HCL (PF) 0.25 % IJ SOLN
INTRAMUSCULAR | Status: AC
  Filled 2016-06-26: qty 30

## 2016-06-26 MED ORDER — METOPROLOL TARTRATE 5 MG/5ML IV SOLN
5.0000 mg | Freq: Once | INTRAVENOUS | Status: AC
  Administered 2016-06-26: 5 mg via INTRAVENOUS
  Filled 2016-06-26: qty 5

## 2016-06-26 MED ORDER — FENTANYL CITRATE (PF) 100 MCG/2ML IJ SOLN
25.0000 ug | INTRAMUSCULAR | Status: DC | PRN
  Administered 2016-06-26 – 2016-07-10 (×2): 50 ug via INTRAVENOUS
  Administered 2016-07-16: 100 ug via INTRAVENOUS
  Administered 2016-07-17 (×2): 25 ug via INTRAVENOUS
  Filled 2016-06-26 (×6): qty 2

## 2016-06-26 MED ORDER — SUGAMMADEX SODIUM 200 MG/2ML IV SOLN
INTRAVENOUS | Status: AC
  Filled 2016-06-26: qty 2

## 2016-06-26 MED ORDER — LIDOCAINE 2% (20 MG/ML) 5 ML SYRINGE
INTRAMUSCULAR | Status: AC
  Filled 2016-06-26: qty 5

## 2016-06-26 MED ORDER — SODIUM CHLORIDE 0.9 % IV SOLN
INTRAVENOUS | Status: DC
  Administered 2016-06-26 – 2016-06-27 (×3): via INTRAVENOUS

## 2016-06-26 MED ORDER — PROPOFOL 10 MG/ML IV BOLUS
INTRAVENOUS | Status: DC | PRN
  Administered 2016-06-26: 20 mg via INTRAVENOUS
  Administered 2016-06-26: 130 mg via INTRAVENOUS

## 2016-06-26 MED ORDER — LEVALBUTEROL HCL 0.63 MG/3ML IN NEBU: 0.6300 mg | INHALATION_SOLUTION | RESPIRATORY_TRACT | Status: DC | PRN

## 2016-06-26 MED ORDER — ROCURONIUM BROMIDE 100 MG/10ML IV SOLN
INTRAVENOUS | Status: DC | PRN
  Administered 2016-06-26: 60 mg via INTRAVENOUS

## 2016-06-26 MED ORDER — PHENOL 1.4 % MT LIQD
1.0000 | OROMUCOSAL | Status: DC | PRN
  Administered 2016-07-04: 1 via OROMUCOSAL
  Filled 2016-06-26: qty 177

## 2016-06-26 MED ORDER — MAGNESIUM SULFATE 2 GM/50ML IV SOLN
2.0000 g | Freq: Once | INTRAVENOUS | Status: AC
  Administered 2016-06-26: 2 g via INTRAVENOUS
  Filled 2016-06-26: qty 50

## 2016-06-26 MED ORDER — ACETAMINOPHEN 325 MG PO TABS: 650.0000 mg | ORAL_TABLET | ORAL | Status: DC | PRN

## 2016-06-26 MED ORDER — FENTANYL CITRATE (PF) 250 MCG/5ML IJ SOLN
INTRAMUSCULAR | Status: AC
  Filled 2016-06-26: qty 5

## 2016-06-26 MED ORDER — DEXAMETHASONE SODIUM PHOSPHATE 10 MG/ML IJ SOLN
INTRAMUSCULAR | Status: DC | PRN
  Administered 2016-06-26: 10 mg via INTRAVENOUS

## 2016-06-26 MED ORDER — ONDANSETRON HCL 4 MG PO TABS: 4.0000 mg | ORAL_TABLET | Freq: Four times a day (QID) | ORAL | Status: DC | PRN

## 2016-06-26 MED ORDER — PHENYLEPHRINE HCL 10 MG/ML IJ SOLN
INTRAMUSCULAR | Status: DC | PRN
  Administered 2016-06-26 (×2): 120 ug via INTRAVENOUS
  Administered 2016-06-26: 40 ug via INTRAVENOUS
  Administered 2016-06-26: 120 ug via INTRAVENOUS

## 2016-06-26 MED ORDER — PROPOFOL 10 MG/ML IV BOLUS
INTRAVENOUS | Status: AC
  Filled 2016-06-26: qty 20

## 2016-06-26 MED ORDER — METOPROLOL TARTRATE 5 MG/5ML IV SOLN
5.0000 mg | Freq: Four times a day (QID) | INTRAVENOUS | Status: DC | PRN
  Administered 2016-06-28 – 2016-07-26 (×10): 5 mg via INTRAVENOUS
  Filled 2016-06-26 (×12): qty 5

## 2016-06-26 MED ORDER — ROCURONIUM BROMIDE 10 MG/ML (PF) SYRINGE
PREFILLED_SYRINGE | INTRAVENOUS | Status: AC
  Filled 2016-06-26: qty 5

## 2016-06-26 MED ORDER — VANCOMYCIN HCL 1000 MG IV SOLR
INTRAVENOUS | Status: AC
  Filled 2016-06-26: qty 1000

## 2016-06-26 SURGICAL SUPPLY — 73 items
ADH SKN CLS APL DERMABOND .7 (GAUZE/BANDAGES/DRESSINGS) ×1
APL SKNCLS STERI-STRIP NONHPOA (GAUZE/BANDAGES/DRESSINGS) ×1
BAG DECANTER FOR FLEXI CONT (MISCELLANEOUS) ×5 IMPLANT
BENZOIN TINCTURE PRP APPL 2/3 (GAUZE/BANDAGES/DRESSINGS) ×3 IMPLANT
BLADE CLIPPER SURG (BLADE) IMPLANT
CANISTER SUCT 3000ML PPV (MISCELLANEOUS) ×5 IMPLANT
CARTRIDGE OIL MAESTRO DRILL (MISCELLANEOUS) ×1 IMPLANT
CLOSURE WOUND 1/2 X4 (GAUZE/BANDAGES/DRESSINGS) ×2
CORDS BIPOLAR (ELECTRODE) ×1 IMPLANT
DECANTER SPIKE VIAL GLASS SM (MISCELLANEOUS) ×7 IMPLANT
DERMABOND ADVANCED (GAUZE/BANDAGES/DRESSINGS) ×2
DERMABOND ADVANCED .7 DNX12 (GAUZE/BANDAGES/DRESSINGS) ×1 IMPLANT
DIFFUSER DRILL AIR PNEUMATIC (MISCELLANEOUS) ×1 IMPLANT
DRAPE LAPAROTOMY 100X72X124 (DRAPES) ×3 IMPLANT
DRAPE POUCH INSTRU U-SHP 10X18 (DRAPES) ×3 IMPLANT
DRAPE SURG 17X23 STRL (DRAPES) IMPLANT
DRSG OPSITE 4X5.5 SM (GAUZE/BANDAGES/DRESSINGS) ×2 IMPLANT
DRSG OPSITE POSTOP 4X6 (GAUZE/BANDAGES/DRESSINGS) ×2 IMPLANT
ELECT REM PT RETURN 9FT ADLT (ELECTROSURGICAL) ×3
ELECTRODE REM PT RTRN 9FT ADLT (ELECTROSURGICAL) ×1 IMPLANT
EVACUATOR 1/8 PVC DRAIN (DRAIN) ×2 IMPLANT
GAUZE SPONGE 4X4 12PLY STRL (GAUZE/BANDAGES/DRESSINGS) ×3 IMPLANT
GAUZE SPONGE 4X4 16PLY XRAY LF (GAUZE/BANDAGES/DRESSINGS) ×2 IMPLANT
GLOVE BIO SURGEON STRL SZ 6.5 (GLOVE) IMPLANT
GLOVE BIO SURGEON STRL SZ7 (GLOVE) IMPLANT
GLOVE BIO SURGEON STRL SZ7.5 (GLOVE) IMPLANT
GLOVE BIO SURGEON STRL SZ8 (GLOVE) ×3 IMPLANT
GLOVE BIO SURGEON STRL SZ8.5 (GLOVE) IMPLANT
GLOVE BIO SURGEONS STRL SZ 6.5 (GLOVE)
GLOVE BIOGEL M 8.0 STRL (GLOVE) IMPLANT
GLOVE BIOGEL PI IND STRL 7.0 (GLOVE) IMPLANT
GLOVE BIOGEL PI INDICATOR 7.0 (GLOVE)
GLOVE ECLIPSE 6.5 STRL STRAW (GLOVE) IMPLANT
GLOVE ECLIPSE 7.0 STRL STRAW (GLOVE) IMPLANT
GLOVE ECLIPSE 7.5 STRL STRAW (GLOVE) IMPLANT
GLOVE ECLIPSE 8.0 STRL XLNG CF (GLOVE) IMPLANT
GLOVE ECLIPSE 8.5 STRL (GLOVE) IMPLANT
GLOVE EXAM NITRILE LRG STRL (GLOVE) IMPLANT
GLOVE EXAM NITRILE XL STR (GLOVE) IMPLANT
GLOVE EXAM NITRILE XS STR PU (GLOVE) IMPLANT
GLOVE INDICATOR 6.5 STRL GRN (GLOVE) IMPLANT
GLOVE INDICATOR 7.0 STRL GRN (GLOVE) IMPLANT
GLOVE INDICATOR 7.5 STRL GRN (GLOVE) IMPLANT
GLOVE INDICATOR 8.0 STRL GRN (GLOVE) IMPLANT
GLOVE INDICATOR 8.5 STRL (GLOVE) ×3 IMPLANT
GLOVE OPTIFIT SS 8.0 STRL (GLOVE) IMPLANT
GLOVE SURG SS PI 6.5 STRL IVOR (GLOVE) IMPLANT
GOWN STRL REUS W/ TWL LRG LVL3 (GOWN DISPOSABLE) ×1 IMPLANT
GOWN STRL REUS W/ TWL XL LVL3 (GOWN DISPOSABLE) ×1 IMPLANT
GOWN STRL REUS W/TWL 2XL LVL3 (GOWN DISPOSABLE) ×3 IMPLANT
GOWN STRL REUS W/TWL LRG LVL3 (GOWN DISPOSABLE) ×3
GOWN STRL REUS W/TWL XL LVL3 (GOWN DISPOSABLE) ×3
KIT BASIN OR (CUSTOM PROCEDURE TRAY) ×3 IMPLANT
KIT ROOM TURNOVER OR (KITS) ×3 IMPLANT
NDL HYPO 25X1 1.5 SAFETY (NEEDLE) IMPLANT
NEEDLE HYPO 25X1 1.5 SAFETY (NEEDLE) ×3 IMPLANT
NS IRRIG 1000ML POUR BTL (IV SOLUTION) ×3 IMPLANT
OIL CARTRIDGE MAESTRO DRILL (MISCELLANEOUS)
PACK LAMINECTOMY NEURO (CUSTOM PROCEDURE TRAY) ×3 IMPLANT
PATTIES SURGICAL .75X.75 (GAUZE/BANDAGES/DRESSINGS) IMPLANT
SPONGE SURGIFOAM ABS GEL SZ50 (HEMOSTASIS) ×3 IMPLANT
STRIP CLOSURE SKIN 1/2X4 (GAUZE/BANDAGES/DRESSINGS) ×3 IMPLANT
SUT VIC AB 0 CT1 18XCR BRD8 (SUTURE) ×1 IMPLANT
SUT VIC AB 0 CT1 8-18 (SUTURE) ×3
SUT VIC AB 2-0 CT1 18 (SUTURE) ×5 IMPLANT
SUT VICRYL 4-0 PS2 18IN ABS (SUTURE) ×3 IMPLANT
SWAB COLLECTION DEVICE MRSA (MISCELLANEOUS) ×3 IMPLANT
SWAB CULTURE ESWAB REG 1ML (MISCELLANEOUS) ×3 IMPLANT
SWAB CULTURE LIQ STUART DBL (MISCELLANEOUS) ×12 IMPLANT
SYR CONTROL 10ML LL (SYRINGE) ×2 IMPLANT
TOWEL GREEN STERILE (TOWEL DISPOSABLE) ×2 IMPLANT
TOWEL GREEN STERILE FF (TOWEL DISPOSABLE) ×3 IMPLANT
WATER STERILE IRR 1000ML POUR (IV SOLUTION) ×3 IMPLANT

## 2016-06-26 NOTE — Care Management Note (Addendum)
Case Management Note Donn Pierini RN, BSN Unit 2W-Case Manager [phone]  Patient Details  Name: Erin [name] MRN: [id number] Date of Birth: [birth year]  Subjective/Objective:   Pt admitted with sepsis, AKI, plan for OR today 06/26/16                 Action/Plan: PTA pt was from home with son, recently discharged from SNF-rehab stay- uses RW. - CM to follow for d/c needs post op - per Essie Hart. RNCM- pt made HRI with [facility] if pt  to go home with [facility] services- per [facility] mtg on 06/26/16   Expected Discharge Date:                  Expected Discharge Plan:     In-House Referral:  Clinical Social Work  Discharge planning Services  CM Consult  Post Acute Care Choice:    Choice offered to:     DME Arranged:    DME Agency:     HH Arranged:    HH Agency:     Status of Service:  In process, will continue to follow  If discussed at Long Length of Stay Meetings, dates discussed:    Discharge Disposition:   Additional Comments:  Darrold Span, RN 06/26/2016, 2:36 PM

## 2016-06-26 NOTE — Anesthesia Procedure Notes (Signed)
Procedure Name: Intubation Date/Time: 06/26/2016 3:32 PM Performed by: Candis Shine Pre-anesthesia Checklist: Patient identified, Emergency Drugs available, Suction available and Patient being monitored Patient Re-evaluated:Patient Re-evaluated prior to inductionOxygen Delivery Method: Circle System Utilized Preoxygenation: Pre-oxygenation with 100% oxygen Intubation Type: IV induction Ventilation: Mask ventilation without difficulty Laryngoscope Size: Mac and 3 Grade View: Grade I Tube type: Subglottic suction tube Tube size: 7.5 mm Number of attempts: 1 Airway Equipment and Method: Stylet Placement Confirmation: ETT inserted through vocal cords under direct vision,  positive ETCO2 and breath sounds checked- equal and bilateral Secured at: 22 cm Tube secured with: Tape Dental Injury: Teeth and Oropharynx as per pre-operative assessment

## 2016-06-26 NOTE — Progress Notes (Signed)
Pt converted back to NSR. Will monitor.

## 2016-06-26 NOTE — Progress Notes (Signed)
Patient ID: Erin [name], female   DOB: [birth year], 71 y.o.   MRN: [id number] Evening yesterday patient developed increased confusion workup included repeat white count which showed a significant bump from 10/29/2017 follow-up MRI scan last night showed multiple small foci of epidural abscess and infection. With increased fluid and increased mass effect. After extensive discussions with the patient and family and primary medical service with elected to proceed for with I and D of lumbar wound. Take additional cultures continue antibiotics will supplement with vancomycin I've gone over the risks and benefits of reoperation as well as perioperative course expectations of outcome and alternatives of surgery and they understand and agree to proceed forward.

## 2016-06-26 NOTE — Progress Notes (Signed)
INFECTIOUS DISEASE PROGRESS NOTE  ID: Erin [name] is a 71 y.o. female with  Principal Problem:   Sepsis (HCC) Active Problems:   Spinal stenosis at L4-L5 level   Dyslipidemia   Acute midline low back pain without sciatica   Hypokalemia   CAD in native artery   Hx of CABG   AKI (acute kidney injury) (HCC)   Hyponatremia   AF (paroxysmal atrial fibrillation) (HCC)  Subjective: Pt on way to OR  Abtx:  Anti-infectives    Start     Dose/Rate Route Frequency Ordered Stop   06/25/16 1800  cefTRIAXone (ROCEPHIN) 2 g in dextrose 5 % 50 mL IVPB     2 g 100 mL/hr over 30 Minutes Intravenous Every 24 hours 06/25/16 1658     06/22/16 2000  vancomycin (VANCOCIN) IVPB 750 mg/150 ml premix  Status:  Discontinued     750 mg 150 mL/hr over 60 Minutes Intravenous Every 24 hours 06/21/16 1816 06/25/16 0905   06/22/16 0400  piperacillin-tazobactam (ZOSYN) IVPB 3.375 g  Status:  Discontinued     3.375 g 12.5 mL/hr over 240 Minutes Intravenous Every 8 hours 06/21/16 1816 06/25/16 1658   06/21/16 1815  vancomycin (VANCOCIN) 1,500 mg in sodium chloride 0.9 % 500 mL IVPB     1,500 mg 250 mL/hr over 120 Minutes Intravenous  Once 06/21/16 1812 06/22/16 0017   06/21/16 1800  piperacillin-tazobactam (ZOSYN) IVPB 3.375 g     3.375 g 100 mL/hr over 30 Minutes Intravenous  Once 06/21/16 1754 06/21/16 2223   06/21/16 1800  vancomycin (VANCOCIN) IVPB 1000 mg/200 mL premix  Status:  Discontinued     1,000 mg 200 mL/hr over 60 Minutes Intravenous  Once 06/21/16 1754 06/21/16 1812      Medications:  Scheduled: . [MAR Hold] aspirin EC  81 mg Oral Daily  . [MAR Hold] atorvastatin  80 mg Oral q1800  . [MAR Hold] cholecalciferol  2,000 Units Oral QPC breakfast  . [MAR Hold] cycloSPORINE  1 drop Both Eyes BID  . [MAR Hold] levothyroxine  112 mcg Oral QAC breakfast  . [MAR Hold] magnesium oxide  200 mg Oral QPC breakfast  . [MAR Hold] metoprolol tartrate  37.5 mg Oral BID  . [MAR Hold] multivitamin  with minerals  1 tablet Oral Daily  . [MAR Hold] pantoprazole  40 mg Oral Daily  . [MAR Hold] polyethylene glycol  17 g Oral BID  . [MAR Hold] senna-docusate  2 tablet Oral BID  . [MAR Hold] sodium chloride flush  3 mL Intravenous Q12H    Objective: Vital signs in last 24 hours: Temp:  [98 F (36.7 C)-99.5 F (37.5 C)] 98.4 F (36.9 C) (05/08 0857) Pulse Rate:  [75-129] 77 (05/08 0857) Resp:  [18-20] 20 (05/08 1027) BP: (91-140)/(53-68) 120/58 (05/08 1027) SpO2:  [93 %-98 %] 97 % (05/08 0857) Weight:  [89.2 kg (196 lb 10.4 oz)] 89.2 kg (196 lb 10.4 oz) (05/08 0358)   General appearance: no exam  Lab Results  Recent Labs  06/25/16 0306 06/26/16 0413  WBC  --  19.4*  HGB  --  9.2*  HCT  --  28.8*  NA 128* 128*  K 3.9 3.7  CL 92* 92*  CO2 26 24  BUN 18 24*  CREATININE 1.65* 1.94*   Liver Panel No results for input(s): PROT, ALBUMIN, AST, ALT, ALKPHOS, BILITOT, BILIDIR, IBILI in the last 72 hours. Sedimentation Rate  Recent Labs  06/25/16 0306  ESRSEDRATE 110*  C-Reactive Protein  Recent Labs  06/25/16 0306  CRP 37.0*    Microbiology: Recent Results (from the past 240 hour(s))  Urine culture     Status: Abnormal   Collection Time: 06/20/16 11:39 AM  Result Value Ref Range Status   Urine Culture, Routine Final report (A)  Final   Urine Culture result 1 Escherichia coli (A)  Final    Comment: Greater than 100,000 colony forming units per mL Cefazolin <=4 ug/mL Cefazolin with an MIC <=16 predicts susceptibility to the oral agents cefaclor, cefdinir, cefpodoxime, cefprozil, cefuroxime, cephalexin, and loracarbef when used for therapy of uncomplicated urinary tract infections due to E. coli, Klebsiella pneumoniae, and Proteus mirabilis.    ANTIMICROBIAL SUSCEPTIBILITY Comment  Final    Comment:       ** S = Susceptible; I = Intermediate; R = Resistant **                    P = Positive; N = Negative             MICS are expressed in micrograms per mL     Antibiotic                 RSLT#1    RSLT#2    RSLT#3    RSLT#4 Amoxicillin/Clavulanic Acid    S Ampicillin                     S Cefepime                       S Ceftriaxone                    S Cefuroxime                     S Cephalothin                    S Ciprofloxacin                  S Ertapenem                      S Gentamicin                     S Imipenem                       S Levofloxacin                   S Nitrofurantoin                 S Piperacillin                   S Tetracycline                   S Tobramycin                     S Trimethoprim/Sulfa             S   Culture, blood (x 2)     Status: Abnormal   Collection Time: 06/21/16  7:20 PM  Result Value Ref Range Status   Specimen Description BLOOD RIGHT HAND  Final   Special Requests   Final    BOTTLES DRAWN AEROBIC AND ANAEROBIC Blood Culture adequate volume   Culture  Setup Time   Final    GRAM NEGATIVE RODS IN BOTH AEROBIC AND ANAEROBIC BOTTLES CRITICAL RESULT CALLED TO, READ BACK BY AND VERIFIED WITH: N. BATCHELDER PHARM 06/22/16 0838 BEAMJ    Culture (A)  Final    ESCHERICHIA COLI SUSCEPTIBILITIES PERFORMED ON PREVIOUS CULTURE WITHIN THE LAST 5 DAYS.    Report Status 06/24/2016 FINAL  Final  Culture, blood (x 2)     Status: Abnormal   Collection Time: 06/21/16  7:23 PM  Result Value Ref Range Status   Specimen Description BLOOD LEFT ANTECUBITAL  Final   Special Requests   Final    BOTTLES DRAWN AEROBIC AND ANAEROBIC Blood Culture adequate volume   Culture  Setup Time   Final    GRAM NEGATIVE RODS IN BOTH AEROBIC AND ANAEROBIC BOTTLES CRITICAL RESULT CALLED TO, READ BACK BY AND VERIFIED WITH: N BATCHELDER PHARM 05.4.18 0838 BEAMJ    Culture ESCHERICHIA COLI (A)  Final   Report Status 06/24/2016 FINAL  Final   Organism ID, Bacteria ESCHERICHIA COLI  Final      Susceptibility   Escherichia coli - MIC*    AMPICILLIN <=2 SENSITIVE Sensitive     CEFAZOLIN <=4 SENSITIVE Sensitive      CEFEPIME <=1 SENSITIVE Sensitive     CEFTAZIDIME <=1 SENSITIVE Sensitive     CEFTRIAXONE <=1 SENSITIVE Sensitive     CIPROFLOXACIN <=0.25 SENSITIVE Sensitive     GENTAMICIN <=1 SENSITIVE Sensitive     IMIPENEM <=0.25 SENSITIVE Sensitive     TRIMETH/SULFA <=20 SENSITIVE Sensitive     AMPICILLIN/SULBACTAM <=2 SENSITIVE Sensitive     PIP/TAZO <=4 SENSITIVE Sensitive     Extended ESBL NEGATIVE Sensitive     * ESCHERICHIA COLI  Blood Culture ID Panel (Reflexed)     Status: Abnormal   Collection Time: 06/21/16  7:23 PM  Result Value Ref Range Status   Enterococcus species NOT DETECTED NOT DETECTED Final   Listeria monocytogenes NOT DETECTED NOT DETECTED Final   Staphylococcus species NOT DETECTED NOT DETECTED Final   Staphylococcus aureus NOT DETECTED NOT DETECTED Final   Streptococcus species NOT DETECTED NOT DETECTED Final   Streptococcus agalactiae NOT DETECTED NOT DETECTED Final   Streptococcus pneumoniae NOT DETECTED NOT DETECTED Final   Streptococcus pyogenes NOT DETECTED NOT DETECTED Final   Acinetobacter baumannii NOT DETECTED NOT DETECTED Final   Enterobacteriaceae species DETECTED (A) NOT DETECTED Final    Comment: Enterobacteriaceae represent a large family of gram-negative bacteria, not a single organism. CRITICAL RESULT CALLED TO, READ BACK BY AND VERIFIED WITH: N. BATCHELDER PHARM 06/22/2016 0838 BEAM    Enterobacter cloacae complex NOT DETECTED NOT DETECTED Final   Escherichia coli DETECTED (A) NOT DETECTED Final    Comment: CRITICAL RESULT CALLED TO, READ BACK BY AND VERIFIED WITH: N. BATCHELDER PHARM 06/22/2016 0838 BEAMJ    Klebsiella oxytoca NOT DETECTED NOT DETECTED Final   Klebsiella pneumoniae NOT DETECTED NOT DETECTED Final   Proteus species NOT DETECTED NOT DETECTED Final   Serratia marcescens NOT DETECTED NOT DETECTED Final   Carbapenem resistance NOT DETECTED NOT DETECTED Final   Haemophilus influenzae NOT DETECTED NOT DETECTED Final   Neisseria meningitidis  NOT DETECTED NOT DETECTED Final   Pseudomonas aeruginosa NOT DETECTED NOT DETECTED Final   Candida albicans NOT DETECTED NOT DETECTED Final   Candida glabrata NOT DETECTED NOT DETECTED Final   Candida krusei NOT DETECTED NOT DETECTED Final   Candida parapsilosis NOT DETECTED NOT DETECTED Final   Candida tropicalis NOT DETECTED NOT  DETECTED Final  Culture, Urine     Status: Abnormal   Collection Time: 06/21/16  7:51 PM  Result Value Ref Range Status   Specimen Description URINE, CLEAN CATCH  Final   Special Requests NONE  Final   Culture >=100,000 COLONIES/mL ESCHERICHIA COLI (A)  Final   Report Status 06/23/2016 FINAL  Final   Organism ID, Bacteria ESCHERICHIA COLI (A)  Final      Susceptibility   Escherichia coli - MIC*    AMPICILLIN <=2 SENSITIVE Sensitive     CEFAZOLIN <=4 SENSITIVE Sensitive     CEFTRIAXONE <=1 SENSITIVE Sensitive     CIPROFLOXACIN <=0.25 SENSITIVE Sensitive     GENTAMICIN <=1 SENSITIVE Sensitive     IMIPENEM <=0.25 SENSITIVE Sensitive     NITROFURANTOIN <=16 SENSITIVE Sensitive     TRIMETH/SULFA <=20 SENSITIVE Sensitive     AMPICILLIN/SULBACTAM <=2 SENSITIVE Sensitive     PIP/TAZO <=4 SENSITIVE Sensitive     Extended ESBL NEGATIVE Sensitive     * >=100,000 COLONIES/mL ESCHERICHIA COLI  Culture, blood (Routine X 2) w Reflex to ID Panel     Status: Abnormal   Collection Time: 06/24/16 10:58 AM  Result Value Ref Range Status   Specimen Description BLOOD LEFT ARM  Final   Special Requests IN PEDIATRIC BOTTLE Blood Culture adequate volume  Final   Culture  Setup Time   Final    GRAM NEGATIVE RODS IN PEDIATRIC BOTTLE CRITICAL RESULT CALLED TO, READ BACK BY AND VERIFIED WITH: J.  PHARMD, AT 1610 06/25/16 BY D. VANHOOK    Culture (A)  Final    ESCHERICHIA COLI SUSCEPTIBILITIES PERFORMED ON PREVIOUS CULTURE WITHIN THE LAST 5 DAYS.    Report Status 06/26/2016 FINAL  Final  Culture, blood (Routine X 2) w Reflex to ID Panel     Status: None (Preliminary  result)   Collection Time: 06/24/16 10:58 AM  Result Value Ref Range Status   Specimen Description BLOOD LEFT ARM  Final   Special Requests IN PEDIATRIC BOTTLE Blood Culture adequate volume  Final   Culture NO GROWTH 1 DAY  Final   Report Status PENDING  Incomplete  Blood Culture ID Panel (Reflexed)     Status: Abnormal   Collection Time: 06/24/16 10:58 AM  Result Value Ref Range Status   Enterococcus species NOT DETECTED NOT DETECTED Final   Listeria monocytogenes NOT DETECTED NOT DETECTED Final   Staphylococcus species NOT DETECTED NOT DETECTED Final   Staphylococcus aureus NOT DETECTED NOT DETECTED Final   Streptococcus species NOT DETECTED NOT DETECTED Final   Streptococcus agalactiae NOT DETECTED NOT DETECTED Final   Streptococcus pneumoniae NOT DETECTED NOT DETECTED Final   Streptococcus pyogenes NOT DETECTED NOT DETECTED Final   Acinetobacter baumannii NOT DETECTED NOT DETECTED Final   Enterobacteriaceae species DETECTED (A) NOT DETECTED Final    Comment: Enterobacteriaceae represent a large family of gram-negative bacteria, not a single organism. CRITICAL RESULT CALLED TO, READ BACK BY AND VERIFIED WITH: JJeanice Lim PHARMD, AT [phone] 06/25/16 BY D. VANHOOK    Enterobacter cloacae complex NOT DETECTED NOT DETECTED Final   Escherichia coli DETECTED (A) NOT DETECTED Final    Comment: CRITICAL RESULT CALLED TO, READ BACK BY AND VERIFIED WITH: Joette Catching PHARMD, AT [id number] 06/25/16 BY D. VANHOOK    Klebsiella oxytoca NOT DETECTED NOT DETECTED Final   Klebsiella pneumoniae NOT DETECTED NOT DETECTED Final   Proteus species NOT DETECTED NOT DETECTED Final   Serratia marcescens NOT DETECTED NOT DETECTED Final  Carbapenem resistance NOT DETECTED NOT DETECTED Final   Haemophilus influenzae NOT DETECTED NOT DETECTED Final   Neisseria meningitidis NOT DETECTED NOT DETECTED Final   Pseudomonas aeruginosa NOT DETECTED NOT DETECTED Final   Candida albicans NOT DETECTED NOT DETECTED Final   Candida  glabrata NOT DETECTED NOT DETECTED Final   Candida krusei NOT DETECTED NOT DETECTED Final   Candida parapsilosis NOT DETECTED NOT DETECTED Final   Candida tropicalis NOT DETECTED NOT DETECTED Final    Studies/Results: Ct Head Wo Contrast  Result Date: 06/26/2016 CLINICAL DATA:  71 y/o F; confusion. History of migraine and hypertension. EXAM: CT HEAD WITHOUT CONTRAST TECHNIQUE: Contiguous axial images were obtained from the base of the skull through the vertex without intravenous contrast. COMPARISON:  None. FINDINGS: Brain: No evidence of acute infarction, hemorrhage, hydrocephalus, extra-axial collection or mass lesion/mass effect. Mild chronic microvascular ischemic changes and mild parenchymal volume loss of the brain. Vascular: Moderate calcific atherosclerosis of cavernous and paraclinoid internal carotid arteries. Skull: Normal. Negative for fracture or focal lesion. Sinuses/Orbits: No acute finding. Other: None. IMPRESSION: 1. No acute intracranial abnormality identified. 2. Mild chronic microvascular ischemic changes and mild parenchymal volume loss of the brain. Electronically Signed   By: Mitzi Hansen M.D.   On: 06/26/2016 00:45   Mr Lumbar Spine W Contrast  Result Date: 06/25/2016 CLINICAL DATA:  Continued surveillance abnormal postoperative fluid collection. EXAM: MRI LUMBAR SPINE WITH CONTRAST CONTRAST:  8mL MULTIHANCE GADOBENATE DIMEGLUMINE 529 MG/ML IV SOLN COMPARISON:  Precontrast images from 06/21/2016. FINDINGS: Segmentation:  Standard Alignment:  Physiologic. Vertebrae: Abnormal enhancement of the endplates at L4-5 and L5-S1 is nonspecific abnormality, which could be related to recent surgery. Conus medullaris: Extends to the L1 level and appears normal. Paraspinal and other soft tissues: Peripherally enhancing 11 x 19 x 8 mm fluid collection in the midline, dorsal to the L3 spinous process, concerning for an abscess. Disc levels: At L3-4, abnormal enhancing fluid  collections are seen within the spinal canal. Dorsally in the midline, there is a 6 x 7 mm fluid collection as seen on image 23. More superiorly, extending upward in the ventral epidural space behind L3 is a 5 x 5 x 11 mm collection seen best on axial image 20. Dural enhancement extends dorsally as far as the L2-3 interspace. Findings are consistent with meningitis, and epidural abscess formation. At L4-5, a large peripherally enhancing fluid collection is seen in midline, slightly increased from priors. Cross-section is 20 x 38 mm as measured at the interspace. Increased flattening of the thecal sac resulting in severe stenosis. Concern is raised for dorsal epidural abscess. At L5-S1, the fluid collection shows increasing caudal extent, along with increased flattening of the thecal sac. Moderate stenosis is also observed. IMPRESSION: Post infusion imaging of the lumbar spine demonstrates progression (since 5/3) of the previously identified fluid collection at the laminectomy site, with peripheral enhancement, new ventral and dorsal epidural peripherally enhancing fluid collections, as well as a superficial collection dorsal to the L3 spinous process. Increasing spinal stenosis, particularly at L4-5. Concern raised for multiple focal and confluent epidural and paravertebral abscesses. A call has been placed to the ordering provider at the time of interpretation. Electronically Signed   By: Elsie Stain M.D.   On: 06/25/2016 21:58   US Abdomen Complete  Result Date: 06/26/2016 CLINICAL DATA:  Abdominal distention. Sepsis. Acute kidney injury. Query renal abscess. Recent back surgery. History of hypertension. EXAM: ABDOMEN ULTRASOUND COMPLETE COMPARISON:  None. FINDINGS: Gallbladder: No gallstones or wall  thickening visualized. No sonographic Murphy sign noted by sonographer. Common bile duct: Diameter: 4.7 mm, normal Liver: No focal lesion identified. Within normal limits in parenchymal echogenicity. IVC: No  abnormality visualized. Pancreas: Visualized portion unremarkable. Spleen: Size and appearance within normal limits. Right Kidney: Length: 12 cm. Echogenicity within normal limits. No mass or hydronephrosis visualized. Left Kidney: Length: 12 cm. Echogenicity within normal limits. No mass or hydronephrosis visualized. Abdominal aorta: No aneurysm visualized. Other findings: Bladder wall is not thickened. No bladder filling defects. IMPRESSION: Normal examination.  No acute abnormalities demonstrated. Electronically Signed   By: Burman Nieves M.D.   On: 06/26/2016 00:48   Dg Chest Port 1 View  Result Date: 06/24/2016 CLINICAL DATA:  Shortness of breath. EXAM: PORTABLE CHEST 1 VIEW COMPARISON:  Radiograph of Jun 21, 2016. FINDINGS: Stable cardiomegaly. Status post coronary artery bypass graft. No pneumothorax or pleural effusion is noted. Stable minimal atelectasis seen in lingular and right midlung regions. Bony thorax is unremarkable. IMPRESSION: Stable minimal bilateral subsegmental atelectasis. Electronically Signed   By: Lupita Raider, M.D.   On: 06/24/2016 15:03     Assessment/Plan: E coli bacteremia E coli UCx  Paraspinal and Epidural Abscesses  Continue ceftriaxone, increase dose to q12h Appreciate neurosurgery f/u Will repeat BCx  Total days of antibiotics: 5 ceftriaxone         Erin [name] Infectious Diseases (pager) [phone] [email or web address] 06/26/2016, 10:28 AM  LOS: 5 days

## 2016-06-26 NOTE — Progress Notes (Signed)
eLink Physician-Brief Progress Note Patient Name: Erin [name] DOB: 1945-10-08 MRN: [id number]   Date of Service  06/26/2016  HPI/Events of Note   e coli now epidural abscess post op Camera care: appears well, no distress, NOT on vent, HR 109 BP wnl  eICU Interventions  Monitor, may need bolus Treat pain     Intervention Category Evaluation Type: New Patient Evaluation  Nelda Bucks. 06/26/2016, 6:46 PM

## 2016-06-26 NOTE — Plan of Care (Signed)
Problem: Bowel/Gastric: Goal: Gastrointestinal status for postoperative course will improve Outcome: Progressing Post-op laxatives started  Problem: Pain Management: Goal: Pain level will decrease Outcome: Progressing Pain managed with IV fentanyl and po vicodin

## 2016-06-26 NOTE — Anesthesia Postprocedure Evaluation (Addendum)
Anesthesia Post Note  Patient: Erin [name]  Procedure(s) Performed: Procedure(s) (LRB): Incision and Drainage of LUMBAR WOUND (N/A)  Patient location during evaluation: PACU Anesthesia Type: General Level of consciousness: awake and alert Pain management: pain level controlled Vital Signs Assessment: post-procedure vital signs reviewed and stable Respiratory status: spontaneous breathing, nonlabored ventilation, respiratory function stable and patient connected to nasal cannula oxygen Cardiovascular status: blood pressure returned to baseline and stable Postop Assessment: no signs of nausea or vomiting Anesthetic complications: no       Last Vitals:  Vitals:   06/26/16 1817 06/26/16 1830  BP:    Pulse:  (!) 123  Resp:  19  Temp: 37.2 C     Last Pain:  Vitals:   06/26/16 1817  TempSrc:   PainSc: 0-No pain    LLE Motor Response: Purposeful movement (06/26/16 1817) LLE Sensation: Full sensation;No numbness;No pain;No tingling (06/26/16 1817) RLE Motor Response: Purposeful movement (06/26/16 1817) RLE Sensation: Full sensation;No numbness;No pain;No tingling (06/26/16 1817)      [name]
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
## 2016-06-26 NOTE — Anesthesia Preprocedure Evaluation (Signed)
Anesthesia Evaluation  Patient identified by MRN, date of birth, ID band  Reviewed: Allergy & Precautions, NPO status , Patient's Chart, lab work & pertinent test results, reviewed documented beta blocker date and time   Airway Mallampati: III       Dental  (+) Teeth Intact, Dental Advisory Given   Pulmonary    + rhonchi  + decreased breath sounds      Cardiovascular hypertension, Pt. on medications and Pt. on home beta blockers + CAD and + CABG   Rhythm:Irregular Rate:Tachycardia     Neuro/Psych  Headaches, negative psych ROS   GI/Hepatic Neg liver ROS, GERD  Medicated,  Endo/Other  Hypothyroidism   Renal/GU Renal disease  negative genitourinary   Musculoskeletal   Abdominal (+) + obese,   Peds negative pediatric ROS (+)  Hematology   Anesthesia Other Findings   Reproductive/Obstetrics                             EKG: atrial fibrillation, rate 115.   Echo:  Left ventricle: LV systolic function is low normal with an EF of 50-55%.  Septum: No Patent Foramen Ovale present.  Left atrium: Patent foramen ovale not present.  Aortic valve: The valve is bicuspid.  Mitral valve: Mild regurgitation.  Anesthesia Physical Anesthesia Plan  ASA: IV  Anesthesia Plan: General   Post-op Pain Management:    Induction: Intravenous  Airway Management Planned: Oral ETT  Additional Equipment:   Intra-op Plan:   Post-operative Plan: Possible Post-op intubation/ventilation  Informed Consent: I have reviewed the patients History and Physical, chart, labs and discussed the procedure including the risks, benefits and alternatives for the proposed anesthesia with the patient or authorized representative who has indicated his/her understanding and acceptance.   Dental advisory given  Plan Discussed with: CRNA  Anesthesia Plan Comments:         Anesthesia Quick Evaluation

## 2016-06-26 NOTE — Transfer of Care (Signed)
Immediate Anesthesia Transfer of Care Note  Patient: Erin [name]  Procedure(s) Performed: Procedure(s): Incision and Drainage of LUMBAR WOUND (N/A)  Patient Location: PACU  Anesthesia Type:General  Level of Consciousness: drowsy  Airway & Oxygen Therapy: Patient Spontanous Breathing and Patient connected to face mask oxygen  Post-op Assessment: Report given to RN and Post -op Vital signs reviewed and stable  Post vital signs: Reviewed and stable  Last Vitals:  Vitals:   06/26/16 1205 06/26/16 1340  BP: (!) 99/55 96/60  Pulse: 96 96  Resp: 20 (!) 21  Temp: 37.1 C 37 C    Last Pain:  Vitals:   06/26/16 1340  TempSrc: Axillary  PainSc:       Patients Stated Pain Goal: 0 (06/25/16 0320)  Complications: No apparent anesthesia complications
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
## 2016-06-26 NOTE — Progress Notes (Signed)
Pharmacy Antibiotic Note  Erin [name] is a 71 y.o. female admitted on 06/21/2016 with sepsis.  Pharmacy previously managing vancomycin and Zosyn dosing.  Afebrile with normal WBC. Blood cultures persistently positive on Zosyn,, switched to ceftriaxone 2g q 12 hrs.  Pharmacy asked to restart vancomycin today after spinal surgery.  Received 1g of vancomycin in OR at 1600 pm today.   Plan: - Vancomycin 1g IV q 24 hrs. - Ceftriaxone 2g IV q 12 hrs. - Monitor renal fxn, clinical progress.  Height: 5\' 3"  (160 cm) Weight: 196 lb 10.4 oz (89.2 kg) IBW/kg (Calculated) : 52.4  Temp (24hrs), Avg:98.8 F (37.1 C), Min:98 F (36.7 C), Max:99.5 F (37.5 C)   Recent Labs Lab 06/20/16 1139 06/21/16 1922 06/21/16 2240 06/22/16 0158 06/22/16 0225 06/23/16 0307 06/25/16 0306 06/26/16 0413 06/26/16 0936  WBC 19.0* 10.2  --  9.8  --  9.6  --  19.4*  --   CREATININE 1.83* 1.60*  --  1.48*  --  1.22* 1.65* 1.94*  --   LATICACIDVEN  --  1.2 2.2*  --  1.3  --   --   --  0.8    Estimated Creatinine Clearance: 27.8 mL/min (A) (by C-G formula based on SCr of 1.94 mg/dL (H)).    Allergies  Allergen Reactions  . Ace Inhibitors Swelling    ACE stopped after pt seen in ED with facial swelling- allergy testing pending  . Morphine And Related   . Codeine Nausea And Vomiting     Vanc 5/3 >>5/7; Resume 5/8 > Zosyn 5/3 >> 5/7 CTX 5/7 >>  5/6 BCID: EColi 5/6 Blood- 1/2 EColi 5/3 Blood- E coli pan sensitive  5/3 BCID - E coli 5/3 urine - E.coli pan sensitive  5/3 Influenza PCR negative   Tad Moore, BCPS  Clinical Pharmacist Pager [phone]  06/26/2016 6:39 PM
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
## 2016-06-26 NOTE — Progress Notes (Signed)
Rapid response in to check on patient as there are currently no step down beds available.  Pt BP stabilized at this time.  They suggested that we continue to monitor closely and also to check a CBG.  Dr. Blake Divine notified that we are unable to transfer pt at this time and also that pt has recently converted to atrial fibrillation, rate in 110s.  She is ordering PRN IV metoprolol.  Will continue to monitor.

## 2016-06-27 ENCOUNTER — Encounter (HOSPITAL_COMMUNITY): Payer: Self-pay | Admitting: Neurosurgery

## 2016-06-27 DIAGNOSIS — R8271 Bacteriuria: Secondary | ICD-10-CM

## 2016-06-27 DIAGNOSIS — Z978 Presence of other specified devices: Secondary | ICD-10-CM

## 2016-06-27 DIAGNOSIS — G061 Intraspinal abscess and granuloma: Secondary | ICD-10-CM

## 2016-06-27 DIAGNOSIS — M4620 Osteomyelitis of vertebra, site unspecified: Secondary | ICD-10-CM

## 2016-06-27 LAB — TROPONIN I
Troponin I: <0.03 ng/mL (ref <0.03)
Troponin I: <0.03 ng/mL (ref <0.03)
Troponin I: 0.37 ng/mL (ref <0.03)

## 2016-06-27 LAB — RENAL FUNCTION PANEL
Albumin: 1.5 g/dL — ABNORMAL LOW (ref 3.5–5.0)
Anion gap: 10 (ref 5–15)
BUN: 31 mg/dL — ABNORMAL HIGH (ref 6–20)
CO2: 26 mmol/L (ref 22–32)
Calcium: 8.4 mg/dL — ABNORMAL LOW (ref 8.9–10.3)
Chloride: 94 mmol/L — ABNORMAL LOW (ref 101–111)
Creatinine, Ser: 1.94 mg/dL — ABNORMAL HIGH (ref 0.44–1.00)
GFR calc Af Amer: 29 mL/min — ABNORMAL LOW (ref >60)
GFR calc non Af Amer: 25 mL/min — ABNORMAL LOW (ref >60)
Glucose, Bld: 155 mg/dL — ABNORMAL HIGH (ref 65–99)
Phosphorus: 6.3 mg/dL — ABNORMAL HIGH (ref 2.5–4.6)
Potassium: 4.8 mmol/L (ref 3.5–5.1)
Sodium: 130 mmol/L — ABNORMAL LOW (ref 135–145)

## 2016-06-27 LAB — CBC
HCT: 24.4 % — ABNORMAL LOW (ref 36.0–46.0)
Hemoglobin: 7.6 g/dL — ABNORMAL LOW (ref 12.0–15.0)
MCH: 25.6 pg — ABNORMAL LOW (ref 26.0–34.0)
MCHC: 31.1 g/dL (ref 30.0–36.0)
MCV: 82.2 fL (ref 78.0–100.0)
Platelets: 212 10*3/uL (ref 150–400)
RBC: 2.97 MIL/uL — ABNORMAL LOW (ref 3.87–5.11)
RDW: 18.3 % — ABNORMAL HIGH (ref 11.5–15.5)
WBC: 20.8 10*3/uL — ABNORMAL HIGH (ref 4.0–10.5)

## 2016-06-27 LAB — MAGNESIUM: Magnesium: 2.4 mg/dL (ref 1.7–2.4)

## 2016-06-27 LAB — T4, FREE: Free T4: 1.14 ng/dL — ABNORMAL HIGH (ref 0.61–1.12)

## 2016-06-27 LAB — TSH: TSH: 4.558 u[IU]/mL — ABNORMAL HIGH (ref 0.350–4.500)

## 2016-06-27 MED ORDER — PANTOPRAZOLE SODIUM 40 MG PO TBEC
40.0000 mg | DELAYED_RELEASE_TABLET | Freq: Every day | ORAL | Status: DC
  Administered 2016-06-27 – 2016-07-31 (×34): 40 mg via ORAL
  Filled 2016-06-27 (×34): qty 1

## 2016-06-27 NOTE — Care Management Note (Signed)
Case Management Note Donn Pierini RN, BSN Unit 2W-Case Manager [phone]  Patient Details  Name: Erin [name] MRN: [id number] Date of Birth: 23-Jul-1945  Subjective/Objective:   Pt admitted with sepsis, AKI, plan for OR today 06/26/16                 Action/Plan: PTA pt was from home with son, recently discharged from SNF-rehab stay- uses RW. - CM to follow for d/c needs post op - per Essie Hart. RNCM- pt made HRI with [facility] if pt  to go home with [facility] services- per [facility] mtg on 06/26/16   Expected Discharge Date:                  Expected Discharge Plan:     In-House Referral:  Clinical Social Work  Discharge planning Services  CM Consult  Post Acute Care Choice:    Choice offered to:     DME Arranged:    DME Agency:     HH Arranged:    HH Agency:     Status of Service:  In process, will continue to follow  If discussed at Long Length of Stay Meetings, dates discussed:    Discharge Disposition:   Additional Comments:  06/27/16- 1140- Sander Radon, CM- pt s/p reexploration of the lumbar wound for irrigation and debridement and evacuation of lumbar epidural abscess on 06/26/16- on 2H post op with order to tx today-  PT/OT evals pending- CM will follow for recommendations and d/c needs.   Darrold Span, RN 06/27/2016, 11:41 AM

## 2016-06-27 NOTE — Progress Notes (Signed)
INFECTIOUS DISEASE PROGRESS NOTE  ID: Erin [name] is a 71 y.o. female with  Principal Problem:   Sepsis (HCC) Active Problems:   Spinal stenosis at L4-L5 level   Dyslipidemia   Acute midline low back pain without sciatica   Hypokalemia   CAD in native artery   Hx of CABG   AKI (acute kidney injury) (HCC)   Hyponatremia   AF (paroxysmal atrial fibrillation) (HCC)  Subjective: Improved leg pain after surgery yesterday. Some mild soreness in back.   Abtx:  Anti-infectives    Start     Dose/Rate Route Frequency Ordered Stop   06/27/16 1200  vancomycin (VANCOCIN) IVPB 1000 mg/200 mL premix     1,000 mg 200 mL/hr over 60 Minutes Intravenous Every 24 hours 06/26/16 1838     06/26/16 2200  cefTRIAXone (ROCEPHIN) 2 g in dextrose 5 % 50 mL IVPB     2 g 100 mL/hr over 30 Minutes Intravenous Every 12 hours 06/26/16 1439     06/26/16 1641  vancomycin (VANCOCIN) powder  Status:  Discontinued       As needed 06/26/16 1651 06/26/16 1712   06/26/16 1605  bacitracin 50,000 Units in sodium chloride irrigation 0.9 % 500 mL irrigation  Status:  Discontinued       As needed 06/26/16 1649 06/26/16 1712   06/25/16 1800  cefTRIAXone (ROCEPHIN) 2 g in dextrose 5 % 50 mL IVPB  Status:  Discontinued     2 g 100 mL/hr over 30 Minutes Intravenous Every 24 hours 06/25/16 1658 06/26/16 1439   06/22/16 2000  vancomycin (VANCOCIN) IVPB 750 mg/150 ml premix  Status:  Discontinued     750 mg 150 mL/hr over 60 Minutes Intravenous Every 24 hours 06/21/16 1816 06/25/16 0905   06/22/16 0400  piperacillin-tazobactam (ZOSYN) IVPB 3.375 g  Status:  Discontinued     3.375 g 12.5 mL/hr over 240 Minutes Intravenous Every 8 hours 06/21/16 1816 06/25/16 1658   06/21/16 1815  vancomycin (VANCOCIN) 1,500 mg in sodium chloride 0.9 % 500 mL IVPB     1,500 mg 250 mL/hr over 120 Minutes Intravenous  Once 06/21/16 1812 06/22/16 0017   06/21/16 1800  piperacillin-tazobactam (ZOSYN) IVPB 3.375 g     3.375 g 100 mL/hr  over 30 Minutes Intravenous  Once 06/21/16 1754 06/21/16 2223   06/21/16 1800  vancomycin (VANCOCIN) IVPB 1000 mg/200 mL premix  Status:  Discontinued     1,000 mg 200 mL/hr over 60 Minutes Intravenous  Once 06/21/16 1754 06/21/16 1812      Medications:  I have reviewed the patient's current medications. Scheduled: . aspirin EC  81 mg Oral Daily  . atorvastatin  80 mg Oral q1800  . cholecalciferol  2,000 Units Oral QPC breakfast  . cycloSPORINE  1 drop Both Eyes BID  . levothyroxine  112 mcg Oral QAC breakfast  . metoprolol tartrate  37.5 mg Oral BID  . multivitamin with minerals  1 tablet Oral Daily  . pantoprazole (PROTONIX) IV  40 mg Intravenous QHS  . polyethylene glycol  17 g Oral BID  . senna-docusate  2 tablet Oral BID  . sodium chloride flush  3 mL Intravenous Q12H  . sodium chloride flush  3 mL Intravenous Q12H    Objective: Vital signs in last 24 hours: Temp:  [97.3 F (36.3 C)-99.2 F (37.3 C)] 97.5 F (36.4 C) (05/09 8657) Pulse Rate:  [91-125] 125 (05/09 0800) Resp:  [12-30] 22 (05/09 0800) BP: (94-134)/(55-122) 107/71 (05/09  0700) SpO2:  [90 %-100 %] 98 % (05/09 0800) Weight:  [190 lb 7.6 oz (86.4 kg)] 190 lb 7.6 oz (86.4 kg) (05/09 0600)   General appearance: alert, cooperative, no distress and Sitting up in recliner finishing breakfast.  Back: Hemovac drain in place with dressing - clean and dry. Honeycomb dressing in place over lumbar incision - clean and dry. No tenderness upon palpation. Good ROM with observed movement.  Resp: Rhonchi bilateral upper airways that clear with cough. CTA otherwise.  Cardio: regularly irregular rhythm and S1, S2 normal GI: soft, non-tender, non-distended Extremities: edema 1+ edema bilateral lower extremities. Equal tone/strength LEs.  Pulses: 2+ and symmetric Neurologic: unsteady but equal gait moving from [facility] to chair.   Lab Results  Recent Labs  06/26/16 2235 06/27/16 0457  WBC 21.1* 20.8*  HGB 7.4* 7.6*  HCT  23.3* 24.4*  NA 125* 130*  K 4.4 4.8  CL 91* 94*  CO2 24 26  BUN 27* 31*  CREATININE 1.97* 1.94*   Liver Panel  Recent Labs  06/27/16 0457  ALBUMIN 1.5*   Sedimentation Rate  Recent Labs  06/25/16 0306  ESRSEDRATE 110*   C-Reactive Protein  Recent Labs  06/25/16 0306  CRP 37.0*    Microbiology: Recent Results (from the past 240 hour(s))  Urine culture     Status: Abnormal   Collection Time: 06/20/16 11:39 AM  Result Value Ref Range Status   Urine Culture, Routine Final report (A)  Final   Urine Culture result 1 Escherichia coli (A)  Final    Comment: Greater than 100,000 colony forming units per mL Cefazolin <=4 ug/mL Cefazolin with an MIC <=16 predicts susceptibility to the oral agents cefaclor, cefdinir, cefpodoxime, cefprozil, cefuroxime, cephalexin, and loracarbef when used for therapy of uncomplicated urinary tract infections due to E. coli, Klebsiella pneumoniae, and Proteus mirabilis.    ANTIMICROBIAL SUSCEPTIBILITY Comment  Final    Comment:       ** S = Susceptible; I = Intermediate; R = Resistant **                    P = Positive; N = Negative             MICS are expressed in micrograms per mL    Antibiotic                 RSLT#1    RSLT#2    RSLT#3    RSLT#4 Amoxicillin/Clavulanic Acid    S Ampicillin                     S Cefepime                       S Ceftriaxone                    S Cefuroxime                     S Cephalothin                    S Ciprofloxacin                  S Ertapenem                      S Gentamicin  S Imipenem                       S Levofloxacin                   S Nitrofurantoin                 S Piperacillin                   S Tetracycline                   S Tobramycin                     S Trimethoprim/Sulfa             S   Culture, blood (x 2)     Status: Abnormal   Collection Time: 06/21/16  7:20 PM  Result Value Ref Range Status   Specimen Description BLOOD RIGHT HAND  Final    Special Requests   Final    BOTTLES DRAWN AEROBIC AND ANAEROBIC Blood Culture adequate volume   Culture  Setup Time   Final    GRAM NEGATIVE RODS IN BOTH AEROBIC AND ANAEROBIC BOTTLES CRITICAL RESULT CALLED TO, READ BACK BY AND VERIFIED WITH: N. BATCHELDER PHARM 06/22/16 0838 BEAMJ    Culture (A)  Final    ESCHERICHIA COLI SUSCEPTIBILITIES PERFORMED ON PREVIOUS CULTURE WITHIN THE LAST 5 DAYS.    Report Status 06/24/2016 FINAL  Final  Culture, blood (x 2)     Status: Abnormal   Collection Time: 06/21/16  7:23 PM  Result Value Ref Range Status   Specimen Description BLOOD LEFT ANTECUBITAL  Final   Special Requests   Final    BOTTLES DRAWN AEROBIC AND ANAEROBIC Blood Culture adequate volume   Culture  Setup Time   Final    GRAM NEGATIVE RODS IN BOTH AEROBIC AND ANAEROBIC BOTTLES CRITICAL RESULT CALLED TO, READ BACK BY AND VERIFIED WITH: N BATCHELDER PHARM 05.4.18 0838 BEAMJ    Culture ESCHERICHIA COLI (A)  Final   Report Status 06/24/2016 FINAL  Final   Organism ID, Bacteria ESCHERICHIA COLI  Final      Susceptibility   Escherichia coli - MIC*    AMPICILLIN <=2 SENSITIVE Sensitive     CEFAZOLIN <=4 SENSITIVE Sensitive     CEFEPIME <=1 SENSITIVE Sensitive     CEFTAZIDIME <=1 SENSITIVE Sensitive     CEFTRIAXONE <=1 SENSITIVE Sensitive     CIPROFLOXACIN <=0.25 SENSITIVE Sensitive     GENTAMICIN <=1 SENSITIVE Sensitive     IMIPENEM <=0.25 SENSITIVE Sensitive     TRIMETH/SULFA <=20 SENSITIVE Sensitive     AMPICILLIN/SULBACTAM <=2 SENSITIVE Sensitive     PIP/TAZO <=4 SENSITIVE Sensitive     Extended ESBL NEGATIVE Sensitive     * ESCHERICHIA COLI  Blood Culture ID Panel (Reflexed)     Status: Abnormal   Collection Time: 06/21/16  7:23 PM  Result Value Ref Range Status   Enterococcus species NOT DETECTED NOT DETECTED Final   Listeria monocytogenes NOT DETECTED NOT DETECTED Final   Staphylococcus species NOT DETECTED NOT DETECTED Final   Staphylococcus aureus NOT DETECTED NOT  DETECTED Final   Streptococcus species NOT DETECTED NOT DETECTED Final   Streptococcus agalactiae NOT DETECTED NOT DETECTED Final   Streptococcus pneumoniae NOT DETECTED NOT DETECTED Final   Streptococcus pyogenes NOT DETECTED NOT DETECTED Final   Acinetobacter baumannii NOT DETECTED NOT DETECTED Final  Enterobacteriaceae species DETECTED (A) NOT DETECTED Final    Comment: Enterobacteriaceae represent a large family of gram-negative bacteria, not a single organism. CRITICAL RESULT CALLED TO, READ BACK BY AND VERIFIED WITH: N. BATCHELDER PHARM 06/22/2016 0838 BEAM    Enterobacter cloacae complex NOT DETECTED NOT DETECTED Final   Escherichia coli DETECTED (A) NOT DETECTED Final    Comment: CRITICAL RESULT CALLED TO, READ BACK BY AND VERIFIED WITH: N. BATCHELDER PHARM 06/22/2016 0838 BEAMJ    Klebsiella oxytoca NOT DETECTED NOT DETECTED Final   Klebsiella pneumoniae NOT DETECTED NOT DETECTED Final   Proteus species NOT DETECTED NOT DETECTED Final   Serratia marcescens NOT DETECTED NOT DETECTED Final   Carbapenem resistance NOT DETECTED NOT DETECTED Final   Haemophilus influenzae NOT DETECTED NOT DETECTED Final   Neisseria meningitidis NOT DETECTED NOT DETECTED Final   Pseudomonas aeruginosa NOT DETECTED NOT DETECTED Final   Candida albicans NOT DETECTED NOT DETECTED Final   Candida glabrata NOT DETECTED NOT DETECTED Final   Candida krusei NOT DETECTED NOT DETECTED Final   Candida parapsilosis NOT DETECTED NOT DETECTED Final   Candida tropicalis NOT DETECTED NOT DETECTED Final  Culture, Urine     Status: Abnormal   Collection Time: 06/21/16  7:51 PM  Result Value Ref Range Status   Specimen Description URINE, CLEAN CATCH  Final   Special Requests NONE  Final   Culture >=100,000 COLONIES/mL ESCHERICHIA COLI (A)  Final   Report Status 06/23/2016 FINAL  Final   Organism ID, Bacteria ESCHERICHIA COLI (A)  Final      Susceptibility   Escherichia coli - MIC*    AMPICILLIN <=2 SENSITIVE  Sensitive     CEFAZOLIN <=4 SENSITIVE Sensitive     CEFTRIAXONE <=1 SENSITIVE Sensitive     CIPROFLOXACIN <=0.25 SENSITIVE Sensitive     GENTAMICIN <=1 SENSITIVE Sensitive     IMIPENEM <=0.25 SENSITIVE Sensitive     NITROFURANTOIN <=16 SENSITIVE Sensitive     TRIMETH/SULFA <=20 SENSITIVE Sensitive     AMPICILLIN/SULBACTAM <=2 SENSITIVE Sensitive     PIP/TAZO <=4 SENSITIVE Sensitive     Extended ESBL NEGATIVE Sensitive     * >=100,000 COLONIES/mL ESCHERICHIA COLI  Culture, blood (Routine X 2) w Reflex to ID Panel     Status: Abnormal   Collection Time: 06/24/16 10:58 AM  Result Value Ref Range Status   Specimen Description BLOOD LEFT ARM  Final   Special Requests IN PEDIATRIC BOTTLE Blood Culture adequate volume  Final   Culture  Setup Time   Final    GRAM NEGATIVE RODS IN PEDIATRIC BOTTLE CRITICAL RESULT CALLED TO, READ BACK BY AND VERIFIED WITH: J.  PHARMD, AT [phone] 06/25/16 BY D. VANHOOK    Culture (A)  Final    ESCHERICHIA COLI SUSCEPTIBILITIES PERFORMED ON PREVIOUS CULTURE WITHIN THE LAST 5 DAYS.    Report Status 06/26/2016 FINAL  Final  Culture, blood (Routine X 2) w Reflex to ID Panel     Status: None (Preliminary result)   Collection Time: 06/24/16 10:58 AM  Result Value Ref Range Status   Specimen Description BLOOD LEFT ARM  Final   Special Requests IN PEDIATRIC BOTTLE Blood Culture adequate volume  Final   Culture NO GROWTH 2 DAYS  Final   Report Status PENDING  Incomplete  Blood Culture ID Panel (Reflexed)     Status: Abnormal   Collection Time: 06/24/16 10:58 AM  Result Value Ref Range Status   Enterococcus species NOT DETECTED NOT DETECTED Final   Listeria  monocytogenes NOT DETECTED NOT DETECTED Final   Staphylococcus species NOT DETECTED NOT DETECTED Final   Staphylococcus aureus NOT DETECTED NOT DETECTED Final   Streptococcus species NOT DETECTED NOT DETECTED Final   Streptococcus agalactiae NOT DETECTED NOT DETECTED Final   Streptococcus pneumoniae NOT  DETECTED NOT DETECTED Final   Streptococcus pyogenes NOT DETECTED NOT DETECTED Final   Acinetobacter baumannii NOT DETECTED NOT DETECTED Final   Enterobacteriaceae species DETECTED (A) NOT DETECTED Final    Comment: Enterobacteriaceae represent a large family of gram-negative bacteria, not a single organism. CRITICAL RESULT CALLED TO, READ BACK BY AND VERIFIED WITH: Joette Catching PHARMD, AT [phone] 06/25/16 BY D. VANHOOK    Enterobacter cloacae complex NOT DETECTED NOT DETECTED Final   Escherichia coli DETECTED (A) NOT DETECTED Final    Comment: CRITICAL RESULT CALLED TO, READ BACK BY AND VERIFIED WITH: Joette Catching PHARMD, AT [phone] 06/25/16 BY D. VANHOOK    Klebsiella oxytoca NOT DETECTED NOT DETECTED Final   Klebsiella pneumoniae NOT DETECTED NOT DETECTED Final   Proteus species NOT DETECTED NOT DETECTED Final   Serratia marcescens NOT DETECTED NOT DETECTED Final   Carbapenem resistance NOT DETECTED NOT DETECTED Final   Haemophilus influenzae NOT DETECTED NOT DETECTED Final   Neisseria meningitidis NOT DETECTED NOT DETECTED Final   Pseudomonas aeruginosa NOT DETECTED NOT DETECTED Final   Candida albicans NOT DETECTED NOT DETECTED Final   Candida glabrata NOT DETECTED NOT DETECTED Final   Candida krusei NOT DETECTED NOT DETECTED Final   Candida parapsilosis NOT DETECTED NOT DETECTED Final   Candida tropicalis NOT DETECTED NOT DETECTED Final  Aerobic/Anaerobic Culture (surgical/deep wound)     Status: None (Preliminary result)   Collection Time: 06/26/16  4:05 PM  Result Value Ref Range Status   Specimen Description WOUND BACK  Final   Special Requests SUBFASCIAL/SPECIMEN A  Final   Gram Stain   Final    DEGENERATED CELLULAR MATERIAL PRESENT NO ORGANISMS SEEN    Culture PENDING  Incomplete   Report Status PENDING  Incomplete  Aerobic/Anaerobic Culture (surgical/deep wound)     Status: None (Preliminary result)   Collection Time: 06/26/16  4:08 PM  Result Value Ref Range Status   Specimen  Description WOUND BACK  Final   Special Requests EPIDURAL/SPECIMEN B  Final   Gram Stain   Final    DEGENERATED CELLULAR MATERIAL PRESENT NO ORGANISMS SEEN    Culture PENDING  Incomplete   Report Status PENDING  Incomplete  Aerobic/Anaerobic Culture (surgical/deep wound)     Status: None (Preliminary result)   Collection Time: 06/26/16  4:17 PM  Result Value Ref Range Status   Specimen Description WOUND BACK  Final   Special Requests SPECIMEN C/()EPIDURAL  Final   Gram Stain   Final    ABUNDANT WBC PRESENT,BOTH PMN AND MONONUCLEAR NO ORGANISMS SEEN    Culture PENDING  Incomplete   Report Status PENDING  Incomplete  MRSA PCR Screening     Status: None   Collection Time: 06/26/16  6:30 PM  Result Value Ref Range Status   MRSA by PCR NEGATIVE NEGATIVE Final    Comment:        The GeneXpert MRSA Assay (FDA approved for NASAL specimens only), is one component of a comprehensive MRSA colonization surveillance program. It is not intended to diagnose MRSA infection nor to guide or monitor treatment for MRSA infections.     Studies/Results: Ct Head Wo Contrast  Result Date: 06/26/2016 CLINICAL DATA:  71 y/o F; confusion.  History of migraine and hypertension. EXAM: CT HEAD WITHOUT CONTRAST TECHNIQUE: Contiguous axial images were obtained from the base of the skull through the vertex without intravenous contrast. COMPARISON:  None. FINDINGS: Brain: No evidence of acute infarction, hemorrhage, hydrocephalus, extra-axial collection or mass lesion/mass effect. Mild chronic microvascular ischemic changes and mild parenchymal volume loss of the brain. Vascular: Moderate calcific atherosclerosis of cavernous and paraclinoid internal carotid arteries. Skull: Normal. Negative for fracture or focal lesion. Sinuses/Orbits: No acute finding. Other: None. IMPRESSION: 1. No acute intracranial abnormality identified. 2. Mild chronic microvascular ischemic changes and mild parenchymal volume loss of the  brain. Electronically Signed   By: Mitzi Hansen M.D.   On: 06/26/2016 00:45   Mr Lumbar Spine W Contrast  Result Date: 06/25/2016 CLINICAL DATA:  Continued surveillance abnormal postoperative fluid collection. EXAM: MRI LUMBAR SPINE WITH CONTRAST CONTRAST:  8mL MULTIHANCE GADOBENATE DIMEGLUMINE 529 MG/ML IV SOLN COMPARISON:  Precontrast images from 06/21/2016. FINDINGS: Segmentation:  Standard Alignment:  Physiologic. Vertebrae: Abnormal enhancement of the endplates at L4-5 and L5-S1 is nonspecific abnormality, which could be related to recent surgery. Conus medullaris: Extends to the L1 level and appears normal. Paraspinal and other soft tissues: Peripherally enhancing 11 x 19 x 8 mm fluid collection in the midline, dorsal to the L3 spinous process, concerning for an abscess. Disc levels: At L3-4, abnormal enhancing fluid collections are seen within the spinal canal. Dorsally in the midline, there is a 6 x 7 mm fluid collection as seen on image 23. More superiorly, extending upward in the ventral epidural space behind L3 is a 5 x 5 x 11 mm collection seen best on axial image 20. Dural enhancement extends dorsally as far as the L2-3 interspace. Findings are consistent with meningitis, and epidural abscess formation. At L4-5, a large peripherally enhancing fluid collection is seen in midline, slightly increased from priors. Cross-section is 20 x 38 mm as measured at the interspace. Increased flattening of the thecal sac resulting in severe stenosis. Concern is raised for dorsal epidural abscess. At L5-S1, the fluid collection shows increasing caudal extent, along with increased flattening of the thecal sac. Moderate stenosis is also observed. IMPRESSION: Post infusion imaging of the lumbar spine demonstrates progression (since 5/3) of the previously identified fluid collection at the laminectomy site, with peripheral enhancement, new ventral and dorsal epidural peripherally enhancing fluid collections,  as well as a superficial collection dorsal to the L3 spinous process. Increasing spinal stenosis, particularly at L4-5. Concern raised for multiple focal and confluent epidural and paravertebral abscesses. A call has been placed to the ordering provider at the time of interpretation. Electronically Signed   By: Elsie Stain M.D.   On: 06/25/2016 21:58   US Abdomen Complete  Result Date: 06/26/2016 CLINICAL DATA:  Abdominal distention. Sepsis. Acute kidney injury. Query renal abscess. Recent back surgery. History of hypertension. EXAM: ABDOMEN ULTRASOUND COMPLETE COMPARISON:  None. FINDINGS: Gallbladder: No gallstones or wall thickening visualized. No sonographic Murphy sign noted by sonographer. Common bile duct: Diameter: 4.7 mm, normal Liver: No focal lesion identified. Within normal limits in parenchymal echogenicity. IVC: No abnormality visualized. Pancreas: Visualized portion unremarkable. Spleen: Size and appearance within normal limits. Right Kidney: Length: 12 cm. Echogenicity within normal limits. No mass or hydronephrosis visualized. Left Kidney: Length: 12 cm. Echogenicity within normal limits. No mass or hydronephrosis visualized. Abdominal aorta: No aneurysm visualized. Other findings: Bladder wall is not thickened. No bladder filling defects. IMPRESSION: Normal examination.  No acute abnormalities demonstrated. Electronically Signed  By: Burman Nieves M.D.   On: 06/26/2016 00:48     Assessment/Plan: E coli bacteremia -BCx E coli 1/2 (+) on 5/6 --> will repeat today -Continue Ceftriaxone at current dose  -Will need PICC after clearance of BCxs  E coli UCx -No urinary complaints  Paraspinal and Epidural Abscesses -Appreciate neurosurgery f/u and debriedment yesterday  -Wound Cx with high WBC, nothing on gram stain. Cultures pending -Hemovac in place - started on Vancomycin while in place 5/8   Total days of antibiotics: 6 ceftriaxone; 1 Vancomycin (while drain in place)            Rexene Alberts, NP-C Infectious Diseases (pager) [phone] [email or web address]  06/27/2016, 9:23 AM  LOS: 6 days   NP note reviewed, confirmed.  To be transferred to 2h Will await repeat BCx, place PIC Continue ceftriaxone, plan on 6 weeks.  Stop vanco

## 2016-06-27 NOTE — Progress Notes (Signed)
Patient has arrived on unit from [facility].  Patient oriented to unit, assessed, placed on tele, VS were stable. Accompanied by family member.

## 2016-06-27 NOTE — Progress Notes (Signed)
      [address]       [place] [address]             [phone]      Events of yesterday noted  Feels better today, less confused  BP 107/71 (BP Location: Left Arm)   Pulse (!) 125   Temp 97.5 F (36.4 C) (Oral)   Resp (!) 22   Ht 5\' 3"  (1.6 m)   Wt 190 lb 7.6 oz (86.4 kg)   SpO2 98%   BMI 33.74 kg/m  In and out of atrial fib  Sternum stable, wound is clean and dry with no erythema  Leg wounds Ok, minimal bloody drainage from calf incision  Has left atrial clip in place, so embolic risk low with A fib even if anticoagulation needs to be delayed  Viviann Spare C. Dorris Fetch, MD [facility] [phone]
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
## 2016-06-27 NOTE — Progress Notes (Signed)
S:Somnolent but arouseable.  Up in chair O:BP 104/76   Pulse (!) 114   Temp 97.3 F (36.3 C) (Oral)   Resp 13   Ht 5\' 3"  (1.6 m)   Wt 86.4 kg (190 lb 7.6 oz)   SpO2 100%   BMI 33.74 kg/m   Intake/Output Summary (Last 24 hours) at 06/27/16 0651 Last data filed at 06/27/16 0500  Gross per 24 hour  Intake             1505 ml  Output             1200 ml  Net              305 ml   Weight change: -2.8 kg (-6 lb 2.8 oz) ZOX:[id number] but does awaken JWJ:XBJYN,WGNFA, tachy Resp: Clear ant Abd:+ BS NTND Ext: 1-2+ edema NEURO:CNI No asterixis   . aspirin EC  81 mg Oral Daily  . atorvastatin  80 mg Oral q1800  . cholecalciferol  2,000 Units Oral QPC breakfast  . cycloSPORINE  1 drop Both Eyes BID  . levothyroxine  112 mcg Oral QAC breakfast  . magnesium oxide  200 mg Oral QPC breakfast  . metoprolol tartrate  37.5 mg Oral BID  . multivitamin with minerals  1 tablet Oral Daily  . pantoprazole (PROTONIX) IV  40 mg Intravenous QHS  . polyethylene glycol  17 g Oral BID  . senna-docusate  2 tablet Oral BID  . sodium chloride flush  3 mL Intravenous Q12H  . sodium chloride flush  3 mL Intravenous Q12H   Ct Head Wo Contrast  Result Date: 06/26/2016 CLINICAL DATA:  71 y/o F; confusion. History of migraine and hypertension. EXAM: CT HEAD WITHOUT CONTRAST TECHNIQUE: Contiguous axial images were obtained from the base of the skull through the vertex without intravenous contrast. COMPARISON:  None. FINDINGS: Brain: No evidence of acute infarction, hemorrhage, hydrocephalus, extra-axial collection or mass lesion/mass effect. Mild chronic microvascular ischemic changes and mild parenchymal volume loss of the brain. Vascular: Moderate calcific atherosclerosis of cavernous and paraclinoid internal carotid arteries. Skull: Normal. Negative for fracture or focal lesion. Sinuses/Orbits: No acute finding. Other: None. IMPRESSION: 1. No acute intracranial abnormality identified. 2. Mild chronic  microvascular ischemic changes and mild parenchymal volume loss of the brain. Electronically Signed   By: Mitzi Hansen M.D.   On: 06/26/2016 00:45   Mr Lumbar Spine W Contrast  Result Date: 06/25/2016 CLINICAL DATA:  Continued surveillance abnormal postoperative fluid collection. EXAM: MRI LUMBAR SPINE WITH CONTRAST CONTRAST:  8mL MULTIHANCE GADOBENATE DIMEGLUMINE 529 MG/ML IV SOLN COMPARISON:  Precontrast images from 06/21/2016. FINDINGS: Segmentation:  Standard Alignment:  Physiologic. Vertebrae: Abnormal enhancement of the endplates at L4-5 and L5-S1 is nonspecific abnormality, which could be related to recent surgery. Conus medullaris: Extends to the L1 level and appears normal. Paraspinal and other soft tissues: Peripherally enhancing 11 x 19 x 8 mm fluid collection in the midline, dorsal to the L3 spinous process, concerning for an abscess. Disc levels: At L3-4, abnormal enhancing fluid collections are seen within the spinal canal. Dorsally in the midline, there is a 6 x 7 mm fluid collection as seen on image 23. More superiorly, extending upward in the ventral epidural space behind L3 is a 5 x 5 x 11 mm collection seen best on axial image 20. Dural enhancement extends dorsally as far as the L2-3 interspace. Findings are consistent with meningitis, and epidural abscess formation. At L4-5, a large peripherally enhancing fluid collection is  seen in midline, slightly increased from priors. Cross-section is 20 x 38 mm as measured at the interspace. Increased flattening of the thecal sac resulting in severe stenosis. Concern is raised for dorsal epidural abscess. At L5-S1, the fluid collection shows increasing caudal extent, along with increased flattening of the thecal sac. Moderate stenosis is also observed. IMPRESSION: Post infusion imaging of the lumbar spine demonstrates progression (since 5/3) of the previously identified fluid collection at the laminectomy site, with peripheral enhancement,  new ventral and dorsal epidural peripherally enhancing fluid collections, as well as a superficial collection dorsal to the L3 spinous process. Increasing spinal stenosis, particularly at L4-5. Concern raised for multiple focal and confluent epidural and paravertebral abscesses. A call has been placed to the ordering provider at the time of interpretation. Electronically Signed   By: Elsie Stain M.D.   On: 06/25/2016 21:58   US Abdomen Complete  Result Date: 06/26/2016 CLINICAL DATA:  Abdominal distention. Sepsis. Acute kidney injury. Query renal abscess. Recent back surgery. History of hypertension. EXAM: ABDOMEN ULTRASOUND COMPLETE COMPARISON:  None. FINDINGS: Gallbladder: No gallstones or wall thickening visualized. No sonographic Murphy sign noted by sonographer. Common bile duct: Diameter: 4.7 mm, normal Liver: No focal lesion identified. Within normal limits in parenchymal echogenicity. IVC: No abnormality visualized. Pancreas: Visualized portion unremarkable. Spleen: Size and appearance within normal limits. Right Kidney: Length: 12 cm. Echogenicity within normal limits. No mass or hydronephrosis visualized. Left Kidney: Length: 12 cm. Echogenicity within normal limits. No mass or hydronephrosis visualized. Abdominal aorta: No aneurysm visualized. Other findings: Bladder wall is not thickened. No bladder filling defects. IMPRESSION: Normal examination.  No acute abnormalities demonstrated. Electronically Signed   By: Burman Nieves M.D.   On: 06/26/2016 00:48   BMET    Component Value Date/Time   NA 130 (L) 06/27/2016 0457   NA 126 (L) 06/20/2016 1139   K 4.8 06/27/2016 0457   CL 94 (L) 06/27/2016 0457   CO2 26 06/27/2016 0457   GLUCOSE 155 (H) 06/27/2016 0457   BUN 31 (H) 06/27/2016 0457   BUN 29 (H) 06/20/2016 1139   CREATININE 1.94 (H) 06/27/2016 0457   CALCIUM 8.4 (L) 06/27/2016 0457   GFRNONAA 25 (L) 06/27/2016 0457   GFRAA 29 (L) 06/27/2016 0457   CBC    Component Value  Date/Time   WBC 20.8 (H) 06/27/2016 0457   RBC 2.97 (L) 06/27/2016 0457   HGB 7.6 (L) 06/27/2016 0457   HCT 24.4 (L) 06/27/2016 0457   HCT 29.0 (L) 06/20/2016 1139   PLT 212 06/27/2016 0457   PLT 147 (L) 06/20/2016 1139   MCV 82.2 06/27/2016 0457   MCV 79 06/20/2016 1139   MCH 25.6 (L) 06/27/2016 0457   MCHC 31.1 06/27/2016 0457   RDW 18.3 (H) 06/27/2016 0457   RDW 17.5 (H) 06/20/2016 1139   LYMPHSABS 0.3 (L) 06/21/2016 1922   LYMPHSABS 0.5 (L) 06/20/2016 1139   MONOABS 0.8 06/21/2016 1922   EOSABS 0.0 06/21/2016 1922   EOSABS 0.0 06/20/2016 1139   BASOSABS 0.0 06/21/2016 1922   BASOSABS 0.0 06/20/2016 1139     Assessment:  1. ARF most likely due to hemodynamic changes related to E coli bacteremia and epidural abscess.  UO fair, Scr stable 2. E coli bacteremia, UTI and epidural abscess on rocephin 3. Mild hyponatremia, chronic  Plan: 1. DC mag supp 2. Decrease IV fluids 3. Foley has been removed??  If cannot urinate then needs to be replaced 4. Daily labs 5. Will need  diuresis when hemodynamically more stable   Omar Gayden T

## 2016-06-27 NOTE — Progress Notes (Signed)
CRITICAL VALUE STICKER  CRITICAL VALUE: Troponin 0.37  RECEIVER (on-site recipient of call): RN   DATE & TIME NOTIFIED: 6:57PM 06/27/16  MESSENGER (representative from lab):  MD NOTIFIED:  AKULA  TIME OF NOTIFICATION: 6:56  RESPONSE: no new orders at this time

## 2016-06-27 NOTE — Progress Notes (Signed)
CRITICAL VALUE ALERT  Critical value received:  Trop- .04  Date of notification:  5/9  Time of notification:  0015  Critical value read back:Yes.    Nurse who received alert:  Sharlet Salina  E-link MD notified @0030 

## 2016-06-27 NOTE — Progress Notes (Signed)
Subjective: Patient reports Patient doing much better with significant improvement back and leg pain in addition no neck stiffness much less confused  Objective: Vital signs in last 24 hours: Temp:  [97.3 F (36.3 C)-99.2 F (37.3 C)] 97.3 F (36.3 C) (05/09 0300) Pulse Rate:  [77-125] 125 (05/09 0800) Resp:  [12-30] 22 (05/09 0800) BP: (91-134)/(55-122) 107/71 (05/09 0700) SpO2:  [90 %-100 %] 98 % (05/09 0800) Weight:  [86.4 kg (190 lb 7.6 oz)] 86.4 kg (190 lb 7.6 oz) (05/09 0600)  Intake/Output from previous day: 05/08 0701 - 05/09 0700 In: 1505 [I.V.:1455; IV Piggyback:50] Out: 1200 [Urine:1050; Blood:150] Intake/Output this shift: Total I/O In: 291.3 [I.V.:291.3] Out: -   Awake alert oriented 4 strength 5 out of 5 wound clean dry and intact  Lab Results:  Recent Labs  06/26/16 2235 06/27/16 0457  WBC 21.1* 20.8*  HGB 7.4* 7.6*  HCT 23.3* 24.4*  PLT 173 212   BMET  Recent Labs  06/26/16 2235 06/27/16 0457  NA 125* 130*  K 4.4 4.8  CL 91* 94*  CO2 24 26  GLUCOSE 159* 155*  BUN 27* 31*  CREATININE 1.97* 1.94*  CALCIUM 8.2* 8.4*    Studies/Results: Ct Head Wo Contrast  Result Date: 06/26/2016 CLINICAL DATA:  70 y/o F; confusion. History of migraine and hypertension. EXAM: CT HEAD WITHOUT CONTRAST TECHNIQUE: Contiguous axial images were obtained from the base of the skull through the vertex without intravenous contrast. COMPARISON:  None. FINDINGS: Brain: No evidence of acute infarction, hemorrhage, hydrocephalus, extra-axial collection or mass lesion/mass effect. Mild chronic microvascular ischemic changes and mild parenchymal volume loss of the brain. Vascular: Moderate calcific atherosclerosis of cavernous and paraclinoid internal carotid arteries. Skull: Normal. Negative for fracture or focal lesion. Sinuses/Orbits: No acute finding. Other: None. IMPRESSION: 1. No acute intracranial abnormality identified. 2. Mild chronic microvascular ischemic changes and  mild parenchymal volume loss of the brain. Electronically Signed   By: Mitzi Hansen M.D.   On: 06/26/2016 00:45   Mr Lumbar Spine W Contrast  Result Date: 06/25/2016 CLINICAL DATA:  Continued surveillance abnormal postoperative fluid collection. EXAM: MRI LUMBAR SPINE WITH CONTRAST CONTRAST:  8mL MULTIHANCE GADOBENATE DIMEGLUMINE 529 MG/ML IV SOLN COMPARISON:  Precontrast images from 06/21/2016. FINDINGS: Segmentation:  Standard Alignment:  Physiologic. Vertebrae: Abnormal enhancement of the endplates at L4-5 and L5-S1 is nonspecific abnormality, which could be related to recent surgery. Conus medullaris: Extends to the L1 level and appears normal. Paraspinal and other soft tissues: Peripherally enhancing 11 x 19 x 8 mm fluid collection in the midline, dorsal to the L3 spinous process, concerning for an abscess. Disc levels: At L3-4, abnormal enhancing fluid collections are seen within the spinal canal. Dorsally in the midline, there is a 6 x 7 mm fluid collection as seen on image 23. More superiorly, extending upward in the ventral epidural space behind L3 is a 5 x 5 x 11 mm collection seen best on axial image 20. Dural enhancement extends dorsally as far as the L2-3 interspace. Findings are consistent with meningitis, and epidural abscess formation. At L4-5, a large peripherally enhancing fluid collection is seen in midline, slightly increased from priors. Cross-section is 20 x 38 mm as measured at the interspace. Increased flattening of the thecal sac resulting in severe stenosis. Concern is raised for dorsal epidural abscess. At L5-S1, the fluid collection shows increasing caudal extent, along with increased flattening of the thecal sac. Moderate stenosis is also observed. IMPRESSION: Post infusion imaging of the lumbar spine  demonstrates progression (since 5/3) of the previously identified fluid collection at the laminectomy site, with peripheral enhancement, new ventral and dorsal epidural  peripherally enhancing fluid collections, as well as a superficial collection dorsal to the L3 spinous process. Increasing spinal stenosis, particularly at L4-5. Concern raised for multiple focal and confluent epidural and paravertebral abscesses. A call has been placed to the ordering provider at the time of interpretation. Electronically Signed   By: Elsie Stain M.D.   On: 06/25/2016 21:58   US Abdomen Complete  Result Date: 06/26/2016 CLINICAL DATA:  Abdominal distention. Sepsis. Acute kidney injury. Query renal abscess. Recent back surgery. History of hypertension. EXAM: ABDOMEN ULTRASOUND COMPLETE COMPARISON:  None. FINDINGS: Gallbladder: No gallstones or wall thickening visualized. No sonographic Murphy sign noted by sonographer. Common bile duct: Diameter: 4.7 mm, normal Liver: No focal lesion identified. Within normal limits in parenchymal echogenicity. IVC: No abnormality visualized. Pancreas: Visualized portion unremarkable. Spleen: Size and appearance within normal limits. Right Kidney: Length: 12 cm. Echogenicity within normal limits. No mass or hydronephrosis visualized. Left Kidney: Length: 12 cm. Echogenicity within normal limits. No mass or hydronephrosis visualized. Abdominal aorta: No aneurysm visualized. Other findings: Bladder wall is not thickened. No bladder filling defects. IMPRESSION: Normal examination.  No acute abnormalities demonstrated. Electronically Signed   By: Burman Nieves M.D.   On: 06/26/2016 00:48    Assessment/Plan: 71 year old female postop day 1 from I and D of lumbar wound. Cultures initially just white blood cells and organisms continued IV antibiotics okay from my perspective to transfer to the floor pending evaluation by critical care medicine.  LOS: 6 days     Deatra Mcmahen P 06/27/2016, 8:36 AM
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
## 2016-06-27 NOTE — Progress Notes (Signed)
Lab called to inform that Troponin @ 0457 this morning was <0.03 and not 0.47.

## 2016-06-27 NOTE — Progress Notes (Signed)
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  IV Stopped (06/27/16 1017)     LOS: 6 days    Time spent: 35 minutes.     Kathlen Mody, MD [facility] Pager [phone]  If 7PM-7AM, please contact night-coverage [email or web address] Password [email or web address] 06/27/2016, 2:27 PM

## 2016-06-27 NOTE — Progress Notes (Signed)
eLink Physician-Brief Progress Note Patient Name: Erin [name] DOB: 1945/03/24 MRN: [id number]   Date of Service  06/27/2016  HPI/Events of Note  Troponin #1 = 0.04 - Demand ischemia? Patient is already on ASA and Metoprolol.   eICU Interventions  Will order: 1. 12 Lead EKG now. 2. Continue to trend Troponin.      Intervention Category Intermediate Interventions: Diagnostic test evaluation  Sommer,Steven Dennard Nip 06/27/2016, 12:35 AM

## 2016-06-28 ENCOUNTER — Encounter (HOSPITAL_COMMUNITY): Payer: Self-pay | Admitting: Physician Assistant

## 2016-06-28 ENCOUNTER — Inpatient Hospital Stay (HOSPITAL_COMMUNITY): Payer: Medicare HMO

## 2016-06-28 DIAGNOSIS — I2510 Atherosclerotic heart disease of native coronary artery without angina pectoris: Secondary | ICD-10-CM

## 2016-06-28 DIAGNOSIS — R946 Abnormal results of thyroid function studies: Secondary | ICD-10-CM

## 2016-06-28 DIAGNOSIS — R748 Abnormal levels of other serum enzymes: Secondary | ICD-10-CM

## 2016-06-28 DIAGNOSIS — I248 Other forms of acute ischemic heart disease: Secondary | ICD-10-CM

## 2016-06-28 LAB — RENAL FUNCTION PANEL
Albumin: 1.6 g/dL — ABNORMAL LOW (ref 3.5–5.0)
Anion gap: 10 (ref 5–15)
BUN: 36 mg/dL — ABNORMAL HIGH (ref 6–20)
CO2: 23 mmol/L (ref 22–32)
Calcium: 8.3 mg/dL — ABNORMAL LOW (ref 8.9–10.3)
Chloride: 95 mmol/L — ABNORMAL LOW (ref 101–111)
Creatinine, Ser: 1.81 mg/dL — ABNORMAL HIGH (ref 0.44–1.00)
GFR calc Af Amer: 31 mL/min — ABNORMAL LOW (ref >60)
GFR calc non Af Amer: 27 mL/min — ABNORMAL LOW (ref >60)
Glucose, Bld: 163 mg/dL — ABNORMAL HIGH (ref 65–99)
Phosphorus: 3.9 mg/dL (ref 2.5–4.6)
Potassium: 3.7 mmol/L (ref 3.5–5.1)
Sodium: 128 mmol/L — ABNORMAL LOW (ref 135–145)

## 2016-06-28 LAB — CBC
HCT: 21.5 % — ABNORMAL LOW (ref 36.0–46.0)
Hemoglobin: 7 g/dL — ABNORMAL LOW (ref 12.0–15.0)
MCH: 26.2 pg (ref 26.0–34.0)
MCHC: 32.6 g/dL (ref 30.0–36.0)
MCV: 80.5 fL (ref 78.0–100.0)
Platelets: 245 10*3/uL (ref 150–400)
RBC: 2.67 MIL/uL — ABNORMAL LOW (ref 3.87–5.11)
RDW: 18.5 % — ABNORMAL HIGH (ref 11.5–15.5)
WBC: 20.6 10*3/uL — ABNORMAL HIGH (ref 4.0–10.5)

## 2016-06-28 LAB — PREPARE RBC (CROSSMATCH)

## 2016-06-28 MED ORDER — METOPROLOL TARTRATE 5 MG/5ML IV SOLN
2.5000 mg | Freq: Once | INTRAVENOUS | Status: AC
  Administered 2016-06-28: 2.5 mg via INTRAVENOUS
  Filled 2016-06-28: qty 5

## 2016-06-28 MED ORDER — MAGNESIUM SULFATE 2 GM/50ML IV SOLN
2.0000 g | Freq: Once | INTRAVENOUS | Status: AC
  Administered 2016-06-29: 2 g via INTRAVENOUS
  Filled 2016-06-28: qty 50

## 2016-06-28 MED ORDER — POTASSIUM CHLORIDE IN NACL 20-0.9 MEQ/L-% IV SOLN
INTRAVENOUS | Status: DC
  Administered 2016-06-28: via INTRAVENOUS
  Filled 2016-06-28: qty 1000

## 2016-06-28 MED ORDER — AMIODARONE HCL 200 MG PO TABS
200.0000 mg | ORAL_TABLET | Freq: Every day | ORAL | Status: DC
Start: 2016-06-28 — End: 2016-06-29
  Administered 2016-06-28: 200 mg via ORAL
  Filled 2016-06-28: qty 1

## 2016-06-28 MED ORDER — SODIUM CHLORIDE 0.9 % IV SOLN
Freq: Once | INTRAVENOUS | Status: AC
  Administered 2016-06-28: 10:00:00 via INTRAVENOUS

## 2016-06-28 MED ORDER — POTASSIUM CHLORIDE CRYS ER 20 MEQ PO TBCR
20.0000 meq | EXTENDED_RELEASE_TABLET | Freq: Once | ORAL | Status: AC
  Administered 2016-06-28: 20 meq via ORAL
  Filled 2016-06-28: qty 1

## 2016-06-28 MED ORDER — FUROSEMIDE 10 MG/ML IJ SOLN
40.0000 mg | Freq: Two times a day (BID) | INTRAMUSCULAR | Status: DC
  Administered 2016-06-28 – 2016-07-05 (×14): 40 mg via INTRAVENOUS
  Filled 2016-06-28 (×15): qty 4
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
## 2016-06-28 NOTE — Care Management Important Message (Signed)
Important Message  Patient Details  Name: Erin [name] MRN: [id number] Date of Birth: [birth year]   Medicare Important Message Given:  Yes    Kirsten Spearing [place] 06/28/2016, 11:00 AM

## 2016-06-28 NOTE — Progress Notes (Signed)
Subjective: Patient reports significant improvement in back and leg pain as well as mental confusion postoperatively.  Objective: Vital signs in last 24 hours: Temp:  [98.3 F (36.8 C)-98.9 F (37.2 C)] 98.9 F (37.2 C) (05/10 1610) Pulse Rate:  [87-131] 110 (05/10 1610) Resp:  [16-20] 16 (05/10 1610) BP: (100-138)/(61-81) 100/67 (05/10 1610) SpO2:  [96 %] 96 % (05/10 0531) Weight:  [87.8 kg (193 lb 8 oz)] 87.8 kg (193 lb 8 oz) (05/10 0531)  Intake/Output from previous day: 05/09 0701 - 05/10 0700 In: 991.3 [I.V.:741.3; IV Piggyback:250] Out: -  Intake/Output this shift: Total I/O In: 132.5 [P.O.:120; Blood:12.5] Out: -   incision clean dry and intact strength 5 out of 5  Lab Results:  Recent Labs  06/27/16 0457 06/28/16 0247  WBC 20.8* 20.6*  HGB 7.6* 7.0*  HCT 24.4* 21.5*  PLT 212 245   BMET  Recent Labs  06/27/16 0457 06/28/16 0247  NA 130* 128*  K 4.8 3.7  CL 94* 95*  CO2 26 23  GLUCOSE 155* 163*  BUN 31* 36*  CREATININE 1.94* 1.81*  CALCIUM 8.4* 8.3*    Studies/Results: Dg Chest Port 1 View  Result Date: 06/28/2016 CLINICAL DATA:  Dyspnea. EXAM: PORTABLE CHEST 1 VIEW COMPARISON:  Radiograph of Jun 24, 2016. FINDINGS: Stable cardiomegaly. Status post coronary artery bypass graft. No pneumothorax or significant pleural effusion is noted. Stable minimal subsegmental atelectasis is noted in the right midlung and left lingular regions. Bony thorax is unremarkable. IMPRESSION: Stable minimal bilateral subsegmental atelectasis. Electronically Signed   By: Lupita Raider, M.D.   On: 06/28/2016 07:49    Assessment/Plan: Postoperative day 2 I and D of lumbar wound cultures look like gram-negative rods which would be consistent with her previous blood cultures patient remains on monotherapy with Rocephin.White cell count still 20patient is afebile  LOS: 7 days     Brylen Wagar P 06/28/2016, 4:18 PM

## 2016-06-28 NOTE — Progress Notes (Signed)
PT Cancellation Note  Patient Details Name: Erin [name] MRN: [id number] DOB: [birth year]   Cancelled Treatment:    Reason Eval/Treat Not Completed: Patient declined, no reason specified (pt returned to bed after moving in room with spouse and reports fatigue, Hbg also 7.0 and await blood. Will attempt next date) Spouse also made aware of need to have staff present for mobility   [name] [name] 06/28/2016, 11:58 AM  [name], PT [phone]

## 2016-06-28 NOTE — Progress Notes (Signed)
INFECTIOUS DISEASE PROGRESS NOTE  ID: Erin [name] is a 71 y.o. female with  Principal Problem:   Sepsis (HCC) Active Problems:   Spinal stenosis at L4-L5 level   Dyslipidemia   Acute midline low back pain without sciatica   Hypokalemia   CAD in native artery   Hx of CABG   AKI (acute kidney injury) (HCC)   Hyponatremia   AF (paroxysmal atrial fibrillation) (HCC)   Spinal abscess (HCC)  Subjective: Feeling better overall. Complains of 10/10 back pain today and cannot get comfortable. Walking around intermittently. Her son is at the bedside and verbalizes he wants to take her home and not do rehab again--understands that she will need rehab therapy and infusions at home for several weeks. Also reports he is concerned she has sleep apnea with the snoring she exhibits.   Abtx:  Anti-infectives    Start     Dose/Rate Route Frequency Ordered Stop   06/27/16 1200  vancomycin (VANCOCIN) IVPB 1000 mg/200 mL premix  Status:  Discontinued     1,000 mg 200 mL/hr over 60 Minutes Intravenous Every 24 hours 06/26/16 1838 06/27/16 1400   06/26/16 2200  cefTRIAXone (ROCEPHIN) 2 g in dextrose 5 % 50 mL IVPB     2 g 100 mL/hr over 30 Minutes Intravenous Every 12 hours 06/26/16 1439     06/26/16 1641  vancomycin (VANCOCIN) powder  Status:  Discontinued       As needed 06/26/16 1651 06/26/16 1712   06/26/16 1605  bacitracin 50,000 Units in sodium chloride irrigation 0.9 % 500 mL irrigation  Status:  Discontinued       As needed 06/26/16 1649 06/26/16 1712   06/25/16 1800  cefTRIAXone (ROCEPHIN) 2 g in dextrose 5 % 50 mL IVPB  Status:  Discontinued     2 g 100 mL/hr over 30 Minutes Intravenous Every 24 hours 06/25/16 1658 06/26/16 1439   06/22/16 2000  vancomycin (VANCOCIN) IVPB 750 mg/150 ml premix  Status:  Discontinued     750 mg 150 mL/hr over 60 Minutes Intravenous Every 24 hours 06/21/16 1816 06/25/16 0905   06/22/16 0400  piperacillin-tazobactam (ZOSYN) IVPB 3.375 g  Status:   Discontinued     3.375 g 12.5 mL/hr over 240 Minutes Intravenous Every 8 hours 06/21/16 1816 06/25/16 1658   06/21/16 1815  vancomycin (VANCOCIN) 1,500 mg in sodium chloride 0.9 % 500 mL IVPB     1,500 mg 250 mL/hr over 120 Minutes Intravenous  Once 06/21/16 1812 06/22/16 0017   06/21/16 1800  piperacillin-tazobactam (ZOSYN) IVPB 3.375 g     3.375 g 100 mL/hr over 30 Minutes Intravenous  Once 06/21/16 1754 06/21/16 2223   06/21/16 1800  vancomycin (VANCOCIN) IVPB 1000 mg/200 mL premix  Status:  Discontinued     1,000 mg 200 mL/hr over 60 Minutes Intravenous  Once 06/21/16 1754 06/21/16 1812      Medications:  Scheduled: . aspirin EC  81 mg Oral Daily  . atorvastatin  80 mg Oral q1800  . cholecalciferol  2,000 Units Oral QPC breakfast  . cycloSPORINE  1 drop Both Eyes BID  . levothyroxine  112 mcg Oral QAC breakfast  . metoprolol tartrate  37.5 mg Oral BID  . multivitamin with minerals  1 tablet Oral Daily  . pantoprazole  40 mg Oral QHS  . polyethylene glycol  17 g Oral BID  . senna-docusate  2 tablet Oral BID  . sodium chloride flush  3 mL Intravenous Q12H  .  sodium chloride flush  3 mL Intravenous Q12H    Objective: Vital signs in last 24 hours: Temp:  [97.6 F (36.4 C)-98.4 F (36.9 C)] 98.3 F (36.8 C) (05/10 0531) Pulse Rate:  [90-131] 131 (05/10 0531) Resp:  [18-23] 18 (05/10 0531) BP: (86-119)/(60-97) 119/73 (05/10 0531) SpO2:  [96 %-99 %] 96 % (05/10 0531) Weight:  [193 lb 8 oz (87.8 kg)] 193 lb 8 oz (87.8 kg) (05/10 0531)   General appearance: alert, no distress and Seated in recliner with back brace on. Appears fatigued.  Back: Hemovac drain still in place-small bloody drainage to gauze. Honeycomb dressing clean and dry.  Resp: clear to auscultation bilaterally Chest wall: no tenderness, mediansternotomy incision unremarkable Cardio: irregularly irregular rhythm, S1, S2 normal and tachycardic GI: soft, non-tender; bowel sounds normal; no masses,  no  organomegaly Extremities: extremities normal, atraumatic, no cyanosis or edema and no edema, redness or tenderness in the calves or thighs Pulses: 2+ and symmetric Skin: Skin color, texture, turgor normal. No rashes or lesions Neurologic: Alert and oriented X 3. +Sensation in legs b/l with strength 4/4.  Lab Results  Recent Labs  06/27/16 0457 06/28/16 0247  WBC 20.8* 20.6*  HGB 7.6* 7.0*  HCT 24.4* 21.5*  NA 130* 128*  K 4.8 3.7  CL 94* 95*  CO2 26 23  BUN 31* 36*  CREATININE 1.94* 1.81*   Liver Panel  Recent Labs  06/27/16 0457 06/28/16 0247  ALBUMIN 1.5* 1.6*   Sedimentation Rate No results for input(s): ESRSEDRATE in the last 72 hours. C-Reactive Protein No results for input(s): CRP in the last 72 hours.  Microbiology: Recent Results (from the past 240 hour(s))  Urine culture     Status: Abnormal   Collection Time: 06/20/16 11:39 AM  Result Value Ref Range Status   Urine Culture, Routine Final report (A)  Final   Urine Culture result 1 Escherichia coli (A)  Final    Comment: Greater than 100,000 colony forming units per mL Cefazolin <=4 ug/mL Cefazolin with an MIC <=16 predicts susceptibility to the oral agents cefaclor, cefdinir, cefpodoxime, cefprozil, cefuroxime, cephalexin, and loracarbef when used for therapy of uncomplicated urinary tract infections due to E. coli, Klebsiella pneumoniae, and Proteus mirabilis.    ANTIMICROBIAL SUSCEPTIBILITY Comment  Final    Comment:       ** S = Susceptible; I = Intermediate; R = Resistant **                    P = Positive; N = Negative             MICS are expressed in micrograms per mL    Antibiotic                 RSLT#1    RSLT#2    RSLT#3    RSLT#4 Amoxicillin/Clavulanic Acid    S Ampicillin                     S Cefepime                       S Ceftriaxone                    S Cefuroxime                     S Cephalothin  S Ciprofloxacin                  S Ertapenem                       S Gentamicin                     S Imipenem                       S Levofloxacin                   S Nitrofurantoin                 S Piperacillin                   S Tetracycline                   S Tobramycin                     S Trimethoprim/Sulfa             S   Culture, blood (x 2)     Status: Abnormal   Collection Time: 06/21/16  7:20 PM  Result Value Ref Range Status   Specimen Description BLOOD RIGHT HAND  Final   Special Requests   Final    BOTTLES DRAWN AEROBIC AND ANAEROBIC Blood Culture adequate volume   Culture  Setup Time   Final    GRAM NEGATIVE RODS IN BOTH AEROBIC AND ANAEROBIC BOTTLES CRITICAL RESULT CALLED TO, READ BACK BY AND VERIFIED WITH: N. BATCHELDER PHARM 06/22/16 0838 BEAMJ    Culture (A)  Final    ESCHERICHIA COLI SUSCEPTIBILITIES PERFORMED ON PREVIOUS CULTURE WITHIN THE LAST 5 DAYS.    Report Status 06/24/2016 FINAL  Final  Culture, blood (x 2)     Status: Abnormal   Collection Time: 06/21/16  7:23 PM  Result Value Ref Range Status   Specimen Description BLOOD LEFT ANTECUBITAL  Final   Special Requests   Final    BOTTLES DRAWN AEROBIC AND ANAEROBIC Blood Culture adequate volume   Culture  Setup Time   Final    GRAM NEGATIVE RODS IN BOTH AEROBIC AND ANAEROBIC BOTTLES CRITICAL RESULT CALLED TO, READ BACK BY AND VERIFIED WITH: N BATCHELDER PHARM 05.4.18 0838 BEAMJ    Culture ESCHERICHIA COLI (A)  Final   Report Status 06/24/2016 FINAL  Final   Organism ID, Bacteria ESCHERICHIA COLI  Final      Susceptibility   Escherichia coli - MIC*    AMPICILLIN <=2 SENSITIVE Sensitive     CEFAZOLIN <=4 SENSITIVE Sensitive     CEFEPIME <=1 SENSITIVE Sensitive     CEFTAZIDIME <=1 SENSITIVE Sensitive     CEFTRIAXONE <=1 SENSITIVE Sensitive     CIPROFLOXACIN <=0.25 SENSITIVE Sensitive     GENTAMICIN <=1 SENSITIVE Sensitive     IMIPENEM <=0.25 SENSITIVE Sensitive     TRIMETH/SULFA <=20 SENSITIVE Sensitive     AMPICILLIN/SULBACTAM <=2 SENSITIVE Sensitive      PIP/TAZO <=4 SENSITIVE Sensitive     Extended ESBL NEGATIVE Sensitive     * ESCHERICHIA COLI  Blood Culture ID Panel (Reflexed)     Status: Abnormal   Collection Time: 06/21/16  7:23 PM  Result Value Ref Range Status   Enterococcus species NOT DETECTED NOT DETECTED Final   Listeria monocytogenes NOT DETECTED NOT DETECTED  Final   Staphylococcus species NOT DETECTED NOT DETECTED Final   Staphylococcus aureus NOT DETECTED NOT DETECTED Final   Streptococcus species NOT DETECTED NOT DETECTED Final   Streptococcus agalactiae NOT DETECTED NOT DETECTED Final   Streptococcus pneumoniae NOT DETECTED NOT DETECTED Final   Streptococcus pyogenes NOT DETECTED NOT DETECTED Final   Acinetobacter baumannii NOT DETECTED NOT DETECTED Final   Enterobacteriaceae species DETECTED (A) NOT DETECTED Final    Comment: Enterobacteriaceae represent a large family of gram-negative bacteria, not a single organism. CRITICAL RESULT CALLED TO, READ BACK BY AND VERIFIED WITH: N. BATCHELDER PHARM 06/22/2016 0838 BEAM    Enterobacter cloacae complex NOT DETECTED NOT DETECTED Final   Escherichia coli DETECTED (A) NOT DETECTED Final    Comment: CRITICAL RESULT CALLED TO, READ BACK BY AND VERIFIED WITH: N. BATCHELDER PHARM 06/22/2016 0838 BEAMJ    Klebsiella oxytoca NOT DETECTED NOT DETECTED Final   Klebsiella pneumoniae NOT DETECTED NOT DETECTED Final   Proteus species NOT DETECTED NOT DETECTED Final   Serratia marcescens NOT DETECTED NOT DETECTED Final   Carbapenem resistance NOT DETECTED NOT DETECTED Final   Haemophilus influenzae NOT DETECTED NOT DETECTED Final   Neisseria meningitidis NOT DETECTED NOT DETECTED Final   Pseudomonas aeruginosa NOT DETECTED NOT DETECTED Final   Candida albicans NOT DETECTED NOT DETECTED Final   Candida glabrata NOT DETECTED NOT DETECTED Final   Candida krusei NOT DETECTED NOT DETECTED Final   Candida parapsilosis NOT DETECTED NOT DETECTED Final   Candida tropicalis NOT DETECTED NOT  DETECTED Final  Culture, Urine     Status: Abnormal   Collection Time: 06/21/16  7:51 PM  Result Value Ref Range Status   Specimen Description URINE, CLEAN CATCH  Final   Special Requests NONE  Final   Culture >=100,000 COLONIES/mL ESCHERICHIA COLI (A)  Final   Report Status 06/23/2016 FINAL  Final   Organism ID, Bacteria ESCHERICHIA COLI (A)  Final      Susceptibility   Escherichia coli - MIC*    AMPICILLIN <=2 SENSITIVE Sensitive     CEFAZOLIN <=4 SENSITIVE Sensitive     CEFTRIAXONE <=1 SENSITIVE Sensitive     CIPROFLOXACIN <=0.25 SENSITIVE Sensitive     GENTAMICIN <=1 SENSITIVE Sensitive     IMIPENEM <=0.25 SENSITIVE Sensitive     NITROFURANTOIN <=16 SENSITIVE Sensitive     TRIMETH/SULFA <=20 SENSITIVE Sensitive     AMPICILLIN/SULBACTAM <=2 SENSITIVE Sensitive     PIP/TAZO <=4 SENSITIVE Sensitive     Extended ESBL NEGATIVE Sensitive     * >=100,000 COLONIES/mL ESCHERICHIA COLI  Culture, blood (Routine X 2) w Reflex to ID Panel     Status: Abnormal   Collection Time: 06/24/16 10:58 AM  Result Value Ref Range Status   Specimen Description BLOOD LEFT ARM  Final   Special Requests IN PEDIATRIC BOTTLE Blood Culture adequate volume  Final   Culture  Setup Time   Final    GRAM NEGATIVE RODS IN PEDIATRIC BOTTLE CRITICAL RESULT CALLED TO, READ BACK BY AND VERIFIED WITH: J. Springbrook PHARMD, AT [phone] 06/25/16 BY D. VANHOOK    Culture (A)  Final    ESCHERICHIA COLI SUSCEPTIBILITIES PERFORMED ON PREVIOUS CULTURE WITHIN THE LAST 5 DAYS.    Report Status 06/26/2016 FINAL  Final  Culture, blood (Routine X 2) w Reflex to ID Panel     Status: None (Preliminary result)   Collection Time: 06/24/16 10:58 AM  Result Value Ref Range Status   Specimen Description BLOOD LEFT ARM  Final  Special Requests IN PEDIATRIC BOTTLE Blood Culture adequate volume  Final   Culture NO GROWTH 3 DAYS  Final   Report Status PENDING  Incomplete  Blood Culture ID Panel (Reflexed)     Status: Abnormal   Collection  Time: 06/24/16 10:58 AM  Result Value Ref Range Status   Enterococcus species NOT DETECTED NOT DETECTED Final   Listeria monocytogenes NOT DETECTED NOT DETECTED Final   Staphylococcus species NOT DETECTED NOT DETECTED Final   Staphylococcus aureus NOT DETECTED NOT DETECTED Final   Streptococcus species NOT DETECTED NOT DETECTED Final   Streptococcus agalactiae NOT DETECTED NOT DETECTED Final   Streptococcus pneumoniae NOT DETECTED NOT DETECTED Final   Streptococcus pyogenes NOT DETECTED NOT DETECTED Final   Acinetobacter baumannii NOT DETECTED NOT DETECTED Final   Enterobacteriaceae species DETECTED (A) NOT DETECTED Final    Comment: Enterobacteriaceae represent a large family of gram-negative bacteria, not a single organism. CRITICAL RESULT CALLED TO, READ BACK BY AND VERIFIED WITH: Joette Catching PHARMD, AT [phone] 06/25/16 BY D. VANHOOK    Enterobacter cloacae complex NOT DETECTED NOT DETECTED Final   Escherichia coli DETECTED (A) NOT DETECTED Final    Comment: CRITICAL RESULT CALLED TO, READ BACK BY AND VERIFIED WITH: Joette Catching PHARMD, AT [phone] 06/25/16 BY D. VANHOOK    Klebsiella oxytoca NOT DETECTED NOT DETECTED Final   Klebsiella pneumoniae NOT DETECTED NOT DETECTED Final   Proteus species NOT DETECTED NOT DETECTED Final   Serratia marcescens NOT DETECTED NOT DETECTED Final   Carbapenem resistance NOT DETECTED NOT DETECTED Final   Haemophilus influenzae NOT DETECTED NOT DETECTED Final   Neisseria meningitidis NOT DETECTED NOT DETECTED Final   Pseudomonas aeruginosa NOT DETECTED NOT DETECTED Final   Candida albicans NOT DETECTED NOT DETECTED Final   Candida glabrata NOT DETECTED NOT DETECTED Final   Candida krusei NOT DETECTED NOT DETECTED Final   Candida parapsilosis NOT DETECTED NOT DETECTED Final   Candida tropicalis NOT DETECTED NOT DETECTED Final  Aerobic/Anaerobic Culture (surgical/deep wound)     Status: None (Preliminary result)   Collection Time: 06/26/16  4:05 PM  Result Value  Ref Range Status   Specimen Description WOUND BACK  Final   Special Requests SUBFASCIAL/SPECIMEN A  Final   Gram Stain   Final    DEGENERATED CELLULAR MATERIAL PRESENT NO ORGANISMS SEEN    Culture   Final    FEW GRAM NEGATIVE RODS IDENTIFICATION AND SUSCEPTIBILITIES TO FOLLOW    Report Status PENDING  Incomplete  Aerobic/Anaerobic Culture (surgical/deep wound)     Status: None (Preliminary result)   Collection Time: 06/26/16  4:08 PM  Result Value Ref Range Status   Specimen Description WOUND BACK  Final   Special Requests EPIDURAL/SPECIMEN B  Final   Gram Stain   Final    DEGENERATED CELLULAR MATERIAL PRESENT NO ORGANISMS SEEN    Culture   Final    FEW GRAM NEGATIVE RODS IDENTIFICATION AND SUSCEPTIBILITIES TO FOLLOW    Report Status PENDING  Incomplete  Aerobic/Anaerobic Culture (surgical/deep wound)     Status: None (Preliminary result)   Collection Time: 06/26/16  4:17 PM  Result Value Ref Range Status   Specimen Description WOUND BACK  Final   Special Requests SPECIMEN C/()EPIDURAL  Final   Gram Stain   Final    ABUNDANT WBC PRESENT,BOTH PMN AND MONONUCLEAR NO ORGANISMS SEEN    Culture CULTURE REINCUBATED FOR BETTER GROWTH  Final   Report Status PENDING  Incomplete  MRSA PCR Screening  Status: None   Collection Time: 06/26/16  6:30 PM  Result Value Ref Range Status   MRSA by PCR NEGATIVE NEGATIVE Final    Comment:        The GeneXpert MRSA Assay (FDA approved for NASAL specimens only), is one component of a comprehensive MRSA colonization surveillance program. It is not intended to diagnose MRSA infection nor to guide or monitor treatment for MRSA infections.     Studies/Results: Dg Chest Port 1 View  Result Date: 06/28/2016 CLINICAL DATA:  Dyspnea. EXAM: PORTABLE CHEST 1 VIEW COMPARISON:  Radiograph of Jun 24, 2016. FINDINGS: Stable cardiomegaly. Status post coronary artery bypass graft. No pneumothorax or significant pleural effusion is noted. Stable  minimal subsegmental atelectasis is noted in the right midlung and left lingular regions. Bony thorax is unremarkable. IMPRESSION: Stable minimal bilateral subsegmental atelectasis. Electronically Signed   By: Lupita Raider, M.D.   On: 06/28/2016 07:49     Assessment/Plan:  E coli bacteremia -BCx E coli on 5/3 (2/2), 5/6 (1/2)  -BCx 5/9 >> pending -Continue Ceftriaxone at current dose x 6 weeks after neg BCx -Hold off on PICC until confirmed clearance of E coli.  -Elevated WBC --> stable   E coli UCx -No further issues. Voiding well.   Paraspinal and Epidural Abscesses -5/8 Wound Cx with high WBC >> few GNR, pending  -Hemovac in place with small bloody drainage  Total days of antibiotics: 7 ceftriaxone          Rexene Alberts, NP-C Infectious Diseases (pager) [phone] [email or web address] 06/28/2016, 10:35 AM  LOS: 7 days
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
## 2016-06-28 NOTE — Progress Notes (Signed)
S: feels a little wiped out.  She is hungry.  Say she is urinating despite the lack of recorded UO O:BP 119/73 (BP Location: Left Arm)   Pulse (!) 131   Temp 98.3 F (36.8 C) (Oral)   Resp 18   Ht 5\' 3"  (1.6 m)   Wt 87.8 kg (193 lb 8 oz)   SpO2 96%   BMI 34.28 kg/m   Intake/Output Summary (Last 24 hours) at 06/28/16 1139 Last data filed at 06/27/16 1400  Gross per 24 hour  Intake              425 ml  Output                0 ml  Net              425 ml   Weight change: 1.371 kg (3 lb 0.4 oz) Gen: Awake and alert CVS: Tachy reg Resp: Clear ant Abd:+ BS NTND Ext: 1-2+ edema NEURO:CNI No asterixis.  Drain in back   . aspirin EC  81 mg Oral Daily  . atorvastatin  80 mg Oral q1800  . cholecalciferol  2,000 Units Oral QPC breakfast  . cycloSPORINE  1 drop Both Eyes BID  . levothyroxine  112 mcg Oral QAC breakfast  . metoprolol tartrate  37.5 mg Oral BID  . multivitamin with minerals  1 tablet Oral Daily  . pantoprazole  40 mg Oral QHS  . polyethylene glycol  17 g Oral BID  . senna-docusate  2 tablet Oral BID  . sodium chloride flush  3 mL Intravenous Q12H  . sodium chloride flush  3 mL Intravenous Q12H   Dg Chest Port 1 View  Result Date: 06/28/2016 CLINICAL DATA:  Dyspnea. EXAM: PORTABLE CHEST 1 VIEW COMPARISON:  Radiograph of Jun 24, 2016. FINDINGS: Stable cardiomegaly. Status post coronary artery bypass graft. No pneumothorax or significant pleural effusion is noted. Stable minimal subsegmental atelectasis is noted in the right midlung and left lingular regions. Bony thorax is unremarkable. IMPRESSION: Stable minimal bilateral subsegmental atelectasis. Electronically Signed   By: Lupita Raider, M.D.   On: 06/28/2016 07:49   BMET    Component Value Date/Time   NA 128 (L) 06/28/2016 0247   NA 126 (L) 06/20/2016 1139   K 3.7 06/28/2016 0247   CL 95 (L) 06/28/2016 0247   CO2 23 06/28/2016 0247   GLUCOSE 163 (H) 06/28/2016 0247   BUN 36 (H) 06/28/2016 0247   BUN 29 (H)  06/20/2016 1139   CREATININE 1.81 (H) 06/28/2016 0247   CALCIUM 8.3 (L) 06/28/2016 0247   GFRNONAA 27 (L) 06/28/2016 0247   GFRAA 31 (L) 06/28/2016 0247   CBC    Component Value Date/Time   WBC 20.6 (H) 06/28/2016 0247   RBC 2.67 (L) 06/28/2016 0247   HGB 7.0 (L) 06/28/2016 0247   HCT 21.5 (L) 06/28/2016 0247   HCT 29.0 (L) 06/20/2016 1139   PLT 245 06/28/2016 0247   PLT 147 (L) 06/20/2016 1139   MCV 80.5 06/28/2016 0247   MCV 79 06/20/2016 1139   MCH 26.2 06/28/2016 0247   MCHC 32.6 06/28/2016 0247   RDW 18.5 (H) 06/28/2016 0247   RDW 17.5 (H) 06/20/2016 1139   LYMPHSABS 0.3 (L) 06/21/2016 1922   LYMPHSABS 0.5 (L) 06/20/2016 1139   MONOABS 0.8 06/21/2016 1922   EOSABS 0.0 06/21/2016 1922   EOSABS 0.0 06/20/2016 1139   BASOSABS 0.0 06/21/2016 1922   BASOSABS 0.0 06/20/2016 1139  Assessment:  1. ARF most likely due to hemodynamic changes related to E coli bacteremia and epidural abscess.  UO not recorded, Scr sl lower 2. E coli bacteremia, UTI and epidural abscess on rocephin 3. Mild hyponatremia, chronic  Plan: 1.  Hopefully renal fx will cont to improve now that infection drained and on AB 2. Cont AB 3. Daily Scr 4. Note plans to give blood (per nurse)   Erin [name] T
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
## 2016-06-29 ENCOUNTER — Inpatient Hospital Stay (HOSPITAL_COMMUNITY): Payer: Medicare HMO

## 2016-06-29 DIAGNOSIS — E785 Hyperlipidemia, unspecified: Secondary | ICD-10-CM

## 2016-06-29 DIAGNOSIS — M4620 Osteomyelitis of vertebra, site unspecified: Secondary | ICD-10-CM

## 2016-06-29 LAB — BPAM RBC
Blood Product Expiration Date: 201805312359
Blood Product Expiration Date: 201805312359
ISSUE DATE / TIME: 201805101229
ISSUE DATE / TIME: 201805101558
Unit Type and Rh: 5100
Unit Type and Rh: 5100

## 2016-06-29 LAB — TYPE AND SCREEN
ABO/RH(D): O POS
Antibody Screen: NEGATIVE
Unit division: 0
Unit division: 0

## 2016-06-29 LAB — CBC
HCT: 29.5 % — ABNORMAL LOW (ref 36.0–46.0)
Hemoglobin: 9.5 g/dL — ABNORMAL LOW (ref 12.0–15.0)
MCH: 26.5 pg (ref 26.0–34.0)
MCHC: 32.2 g/dL (ref 30.0–36.0)
MCV: 82.2 fL (ref 78.0–100.0)
Platelets: 176 10*3/uL (ref 150–400)
RBC: 3.59 MIL/uL — ABNORMAL LOW (ref 3.87–5.11)
RDW: 16.6 % — ABNORMAL HIGH (ref 11.5–15.5)
WBC: 14.1 10*3/uL — ABNORMAL HIGH (ref 4.0–10.5)

## 2016-06-29 LAB — RENAL FUNCTION PANEL
Albumin: 1.8 g/dL — ABNORMAL LOW (ref 3.5–5.0)
Anion gap: 7 (ref 5–15)
BUN: 33 mg/dL — ABNORMAL HIGH (ref 6–20)
CO2: 24 mmol/L (ref 22–32)
Calcium: 8.6 mg/dL — ABNORMAL LOW (ref 8.9–10.3)
Chloride: 99 mmol/L — ABNORMAL LOW (ref 101–111)
Creatinine, Ser: 1.56 mg/dL — ABNORMAL HIGH (ref 0.44–1.00)
GFR calc Af Amer: 37 mL/min — ABNORMAL LOW (ref >60)
GFR calc non Af Amer: 32 mL/min — ABNORMAL LOW (ref >60)
Glucose, Bld: 108 mg/dL — ABNORMAL HIGH (ref 65–99)
Phosphorus: 3.4 mg/dL (ref 2.5–4.6)
Potassium: 3.9 mmol/L (ref 3.5–5.1)
Sodium: 130 mmol/L — ABNORMAL LOW (ref 135–145)

## 2016-06-29 LAB — CULTURE, BLOOD (ROUTINE X 2)
Culture: NO GROWTH
Special Requests: ADEQUATE

## 2016-06-29 MED ORDER — AMIODARONE HCL IN DEXTROSE 360-4.14 MG/200ML-% IV SOLN
60.0000 mg/h | INTRAVENOUS | Status: AC
  Administered 2016-06-29: 60 mg/h via INTRAVENOUS
  Filled 2016-06-29: qty 200

## 2016-06-29 MED ORDER — SODIUM CHLORIDE 0.9% FLUSH
10.0000 mL | INTRAVENOUS | Status: DC | PRN
  Administered 2016-06-30 – 2016-07-21 (×8): 10 mL
  Filled 2016-06-29 (×8): qty 40

## 2016-06-29 MED ORDER — AMIODARONE HCL IN DEXTROSE 360-4.14 MG/200ML-% IV SOLN: 30.0000 mg/h | INTRAVENOUS | Status: DC

## 2016-06-29 MED ORDER — AMIODARONE HCL IN DEXTROSE 360-4.14 MG/200ML-% IV SOLN
30.0000 mg/h | INTRAVENOUS | Status: DC
  Administered 2016-06-29 – 2016-07-01 (×5): 30 mg/h via INTRAVENOUS
  Filled 2016-06-29 (×4): qty 200

## 2016-06-29 MED ORDER — AMIODARONE LOAD VIA INFUSION
150.0000 mg | Freq: Once | INTRAVENOUS | Status: AC
  Administered 2016-06-29: 150 mg via INTRAVENOUS
  Filled 2016-06-29: qty 83.34

## 2016-06-29 MED ORDER — AMIODARONE HCL IN DEXTROSE 360-4.14 MG/200ML-% IV SOLN
60.0000 mg/h | INTRAVENOUS | Status: DC
  Administered 2016-06-29: 60 mg/h via INTRAVENOUS
  Filled 2016-06-29: qty 200

## 2016-06-29 MED ORDER — APIXABAN 5 MG PO TABS
5.0000 mg | ORAL_TABLET | Freq: Two times a day (BID) | ORAL | Status: DC
  Administered 2016-06-29 – 2016-07-10 (×22): 5 mg via ORAL
  Filled 2016-06-29 (×22): qty 1
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
## 2016-06-29 NOTE — Progress Notes (Signed)
      [address]       [place] [address]             [phone]      Doesn't feel well this afternoon. Appetite poor  BP 121/80 (BP Location: Left Arm)   Pulse (!) 134   Temp 98.4 F (36.9 C) (Oral)   Resp 18   Ht 5\' 3"  (1.6 m)   Wt 192 lb 7.2 oz (87.3 kg)   SpO2 99%   BMI 34.09 kg/m    Intake/Output Summary (Last 24 hours) at 06/29/16 1339 Last data filed at 06/29/16 0830  Gross per 24 hour  Intake              730 ml  Output             1275 ml  Net             -545 ml   WBC down to 14K Creatinine down to 1.56 Hct up to 29  Afebrile on ceftriaxone- plan 6 week course  Sternal wound OK  Mariyanna Mucha C. Dorris Fetch, MD [facility] [phone]

## 2016-06-29 NOTE — Progress Notes (Signed)
S: Sitting up in chair.  Some back pain O:BP 121/80 (BP Location: Left Arm)   Pulse (!) 134   Temp 98.4 F (36.9 C) (Oral)   Resp 18   Ht 5\' 3"  (1.6 m)   Wt 87.3 kg (192 lb 7.2 oz)   SpO2 99%   BMI 34.09 kg/m   Intake/Output Summary (Last 24 hours) at 06/29/16 0701 Last data filed at 06/29/16 8315  Gross per 24 hour  Intake            742.5 ml  Output             1275 ml  Net           -532.5 ml   Weight change: -0.476 kg (-1 lb 0.8 oz) Gen: Awake and alert CVS: Tachy reg Resp: Clear ant Abd:+ BS NTND Ext: 2+ edema NEURO:CNI No asterixis.  Drain in back   . amiodarone  200 mg Oral Daily  . aspirin EC  81 mg Oral Daily  . atorvastatin  80 mg Oral q1800  . cholecalciferol  2,000 Units Oral QPC breakfast  . cycloSPORINE  1 drop Both Eyes BID  . furosemide  40 mg Intravenous BID  . levothyroxine  112 mcg Oral QAC breakfast  . metoprolol tartrate  37.5 mg Oral BID  . multivitamin with minerals  1 tablet Oral Daily  . pantoprazole  40 mg Oral QHS  . polyethylene glycol  17 g Oral BID  . senna-docusate  2 tablet Oral BID  . sodium chloride flush  3 mL Intravenous Q12H  . sodium chloride flush  3 mL Intravenous Q12H   Dg Chest Port 1 View  Result Date: 06/28/2016 CLINICAL DATA:  Dyspnea. EXAM: PORTABLE CHEST 1 VIEW COMPARISON:  Radiograph of Jun 24, 2016. FINDINGS: Stable cardiomegaly. Status post coronary artery bypass graft. No pneumothorax or significant pleural effusion is noted. Stable minimal subsegmental atelectasis is noted in the right midlung and left lingular regions. Bony thorax is unremarkable. IMPRESSION: Stable minimal bilateral subsegmental atelectasis. Electronically Signed   By: Lupita Raider, M.D.   On: 06/28/2016 07:49   BMET    Component Value Date/Time   NA 130 (L) 06/29/2016 0341   NA 126 (L) 06/20/2016 1139   K 3.9 06/29/2016 0341   CL 99 (L) 06/29/2016 0341   CO2 24 06/29/2016 0341   GLUCOSE 108 (H) 06/29/2016 0341   BUN 33 (H) 06/29/2016 0341    BUN 29 (H) 06/20/2016 1139   CREATININE 1.56 (H) 06/29/2016 0341   CALCIUM 8.6 (L) 06/29/2016 0341   GFRNONAA 32 (L) 06/29/2016 0341   GFRAA 37 (L) 06/29/2016 0341   CBC    Component Value Date/Time   WBC 14.1 (H) 06/29/2016 0341   RBC 3.59 (L) 06/29/2016 0341   HGB 9.5 (L) 06/29/2016 0341   HCT 29.5 (L) 06/29/2016 0341   HCT 29.0 (L) 06/20/2016 1139   PLT 176 06/29/2016 0341   PLT 147 (L) 06/20/2016 1139   MCV 82.2 06/29/2016 0341   MCV 79 06/20/2016 1139   MCH 26.5 06/29/2016 0341   MCHC 32.2 06/29/2016 0341   RDW 16.6 (H) 06/29/2016 0341   RDW 17.5 (H) 06/20/2016 1139   LYMPHSABS 0.3 (L) 06/21/2016 1922   LYMPHSABS 0.5 (L) 06/20/2016 1139   MONOABS 0.8 06/21/2016 1922   EOSABS 0.0 06/21/2016 1922   EOSABS 0.0 06/20/2016 1139   BASOSABS 0.0 06/21/2016 1922   BASOSABS 0.0 06/20/2016 1139     Assessment:  1. ARF most likely due to hemodynamic changes related to E coli bacteremia and epidural abscess. 2. E coli bacteremia, UTI and epidural abscess on rocephin 3. Mild hyponatremia, chronic  Plan: 1.  Anticipate renal fx to cont to recover with treatment of infection.  Will sign off. Call if further renal issues  Nidya Bouyer T

## 2016-06-29 NOTE — Progress Notes (Signed)
ANTIBIOTIC CONSULT NOTE   Pharmacy Consult for Rocephin Indication: UTI/ E.coli bacteremia/spinal abscess  Allergies  Allergen Reactions  . Ace Inhibitors Swelling    ACE stopped after pt seen in ED with facial swelling- allergy testing pending  . Morphine And Related   . Codeine Nausea And Vomiting    Patient Measurements: Height: 5\' 3"  (160 cm) Weight: 192 lb 7.2 oz (87.3 kg) IBW/kg (Calculated) : 52.4  Vital Signs: Temp: 98.4 F (36.9 C) (05/11 0559) Temp Source: Oral (05/11 0559) BP: 121/80 (05/11 0559) Pulse Rate: 134 (05/11 0559) Intake/Output from previous day: 05/10 0701 - 05/11 0700 In: 742.5 [P.O.:360; I.V.:250; Blood:42.5] Out: 1275 [Urine:1275] Intake/Output from this shift: No intake/output data recorded.  Labs:  Recent Labs  06/27/16 0457 06/28/16 0247 06/29/16 0341  WBC 20.8* 20.6* 14.1*  HGB 7.6* 7.0* 9.5*  PLT 212 245 176  CREATININE 1.94* 1.81* 1.56*   Estimated Creatinine Clearance: 34.2 mL/min (A) (by C-G formula based on SCr of 1.56 mg/dL (H)). No results for input(s): VANCOTROUGH, VANCOPEAK, VANCORANDOM, GENTTROUGH, GENTPEAK, GENTRANDOM, TOBRATROUGH, TOBRAPEAK, TOBRARND, AMIKACINPEAK, AMIKACINTROU, AMIKACIN in the last 72 hours.   Microbiology:   Medical History: Past Medical History:  Diagnosis Date  . Arthritis    "all my back is eat up w/it; knees too" (05/24/2016)  . CAD (coronary artery disease)    s/p CABG  . Chronic bronchitis (HCC)   . Chronic lower back pain   . Dyspnea    "since OR 04/2016" (05/24/2016)  . Family history of adverse reaction to anesthesia    "daughter gets PONV" (05/24/2016)  . GERD (gastroesophageal reflux disease)    occ  . High cholesterol   . History of stomach ulcers   . Hypertension   . Hypothyroidism   . Migraine    "usually have one monthly; nothing since 04/25/2016)  . Pneumonia ~ 2002   Assessment:  ID: s/p Vanc/Zosyn for UTI/ E.coli bacteremia/spinal abscess, MRI from post-op neurosurg shows  fluid collection, but not overly characteristic of infection per neurosug> MRI on 5/7 with worsening fluid collections since MRI on 5/4- to get drainage. S/p I&D of lumbar abscess 5/8 - WBC 20.6>14.1. Afebrile. Plan PICC aqnd 6 wks abx.  Vanc 5/3 >>5/7, 5/8>>5/9 Zosyn 5/3 >> 5/7 CTX 5/7 >>  5/8 wound A: few GNR 5/8 wound B: few GNR 5/8: wound C: No organisms seen. 5/8 wound/tissue: ngtd-reincubate 5/6 BCID: EColi 5/6 Blood- 1/2 EColi 5/3 Blood- E coli pan sensitive  5/3 BCID - E coli 5/3 urine - E.coli pan sensitive  5/3 Influenza PCR negative  Goal of Therapy:  Eradication of infection   Plan:  - CTX 2g IV q12h (OPAT orders entered, ONCE daily Rocephin confirmed with Ninetta Lights) - OPAT orders pended - Resume Eliquis   Makhya Arave S. Merilynn Finland, PharmD, BCPS Clinical Staff Pharmacist Pager [phone]  Misty Stanley Stillinger 06/29/2016,9:24 AM

## 2016-06-29 NOTE — Progress Notes (Signed)
Peripherally Inserted Central Catheter/Midline Placement  The IV Nurse has discussed with the patient and/or persons authorized to consent for the patient, the purpose of this procedure and the potential benefits and risks involved with this procedure.  The benefits include less needle sticks, lab draws from the catheter, and the patient may be discharged home with the catheter. Risks include, but not limited to, infection, bleeding, blood clot (thrombus formation), and puncture of an artery; nerve damage and irregular heartbeat and possibility to perform a PICC exchange if needed/ordered by physician.  Alternatives to this procedure were also discussed.  Bard Power PICC patient education guide, fact sheet on infection prevention and patient information card has been provided to patient /or left at bedside.    PICC/Midline Placement Documentation        Stacie Glaze Horton 06/29/2016, 11:42 AM
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
## 2016-06-29 NOTE — Progress Notes (Signed)
INFECTIOUS DISEASE PROGRESS NOTE  ID: Erin [name] is a 71 y.o. female with  Principal Problem:   Sepsis (HCC) Active Problems:   Spinal stenosis at L4-L5 level   Dyslipidemia   Acute midline low back pain without sciatica   Hypokalemia   CAD in native artery   Hx of CABG   AKI (acute kidney injury) (HCC)   Hyponatremia   AF (paroxysmal atrial fibrillation) (HCC)   Spinal abscess (HCC)  Subjective: Without complaints.   Abtx:  Anti-infectives    Start     Dose/Rate Route Frequency Ordered Stop   06/27/16 1200  vancomycin (VANCOCIN) IVPB 1000 mg/200 mL premix  Status:  Discontinued     1,000 mg 200 mL/hr over 60 Minutes Intravenous Every 24 hours 06/26/16 1838 06/27/16 1400   06/26/16 2200  cefTRIAXone (ROCEPHIN) 2 g in dextrose 5 % 50 mL IVPB     2 g 100 mL/hr over 30 Minutes Intravenous Every 12 hours 06/26/16 1439     06/26/16 1641  vancomycin (VANCOCIN) powder  Status:  Discontinued       As needed 06/26/16 1651 06/26/16 1712   06/26/16 1605  bacitracin 50,000 Units in sodium chloride irrigation 0.9 % 500 mL irrigation  Status:  Discontinued       As needed 06/26/16 1649 06/26/16 1712   06/25/16 1800  cefTRIAXone (ROCEPHIN) 2 g in dextrose 5 % 50 mL IVPB  Status:  Discontinued     2 g 100 mL/hr over 30 Minutes Intravenous Every 24 hours 06/25/16 1658 06/26/16 1439   06/22/16 2000  vancomycin (VANCOCIN) IVPB 750 mg/150 ml premix  Status:  Discontinued     750 mg 150 mL/hr over 60 Minutes Intravenous Every 24 hours 06/21/16 1816 06/25/16 0905   06/22/16 0400  piperacillin-tazobactam (ZOSYN) IVPB 3.375 g  Status:  Discontinued     3.375 g 12.5 mL/hr over 240 Minutes Intravenous Every 8 hours 06/21/16 1816 06/25/16 1658   06/21/16 1815  vancomycin (VANCOCIN) 1,500 mg in sodium chloride 0.9 % 500 mL IVPB     1,500 mg 250 mL/hr over 120 Minutes Intravenous  Once 06/21/16 1812 06/22/16 0017   06/21/16 1800  piperacillin-tazobactam (ZOSYN) IVPB 3.375 g     3.375 g 100  mL/hr over 30 Minutes Intravenous  Once 06/21/16 1754 06/21/16 2223   06/21/16 1800  vancomycin (VANCOCIN) IVPB 1000 mg/200 mL premix  Status:  Discontinued     1,000 mg 200 mL/hr over 60 Minutes Intravenous  Once 06/21/16 1754 06/21/16 1812      Medications:  Scheduled: . amiodarone  150 mg Intravenous Once  . aspirin EC  81 mg Oral Daily  . atorvastatin  80 mg Oral q1800  . cholecalciferol  2,000 Units Oral QPC breakfast  . cycloSPORINE  1 drop Both Eyes BID  . furosemide  40 mg Intravenous BID  . levothyroxine  112 mcg Oral QAC breakfast  . metoprolol tartrate  37.5 mg Oral BID  . multivitamin with minerals  1 tablet Oral Daily  . pantoprazole  40 mg Oral QHS  . polyethylene glycol  17 g Oral BID  . senna-docusate  2 tablet Oral BID  . sodium chloride flush  3 mL Intravenous Q12H  . sodium chloride flush  3 mL Intravenous Q12H    Objective: Vital signs in last 24 hours: Temp:  [98.4 F (36.9 C)-98.9 F (37.2 C)] 98.4 F (36.9 C) (05/11 0559) Pulse Rate:  [87-134] 134 (05/11 0559) Resp:  [16-18]  18 (05/11 0559) BP: (100-138)/(65-81) 121/80 (05/11 0559) SpO2:  [95 %-100 %] 99 % (05/11 0559) Weight:  [87.3 kg (192 lb 7.2 oz)] 87.3 kg (192 lb 7.2 oz) (05/11 0559)   General appearance: alert, cooperative and no distress Back: wound dressed.  Resp: diminished breath sounds bilaterally Cardio: irreg GI: normal findings: bowel sounds normal and soft, non-tender  Lab Results  Recent Labs  06/28/16 0247 06/29/16 0341  WBC 20.6* 14.1*  HGB 7.0* 9.5*  HCT 21.5* 29.5*  NA 128* 130*  K 3.7 3.9  CL 95* 99*  CO2 23 24  BUN 36* 33*  CREATININE 1.81* 1.56*   Liver Panel  Recent Labs  06/28/16 0247 06/29/16 0341  ALBUMIN 1.6* 1.8*   Sedimentation Rate No results for input(s): ESRSEDRATE in the last 72 hours. C-Reactive Protein No results for input(s): CRP in the last 72 hours.  Microbiology: Recent Results (from the past 240 hour(s))  Urine culture      Status: Abnormal   Collection Time: 06/20/16 11:39 AM  Result Value Ref Range Status   Urine Culture, Routine Final report (A)  Final   Urine Culture result 1 Escherichia coli (A)  Final    Comment: Greater than 100,000 colony forming units per mL Cefazolin <=4 ug/mL Cefazolin with an MIC <=16 predicts susceptibility to the oral agents cefaclor, cefdinir, cefpodoxime, cefprozil, cefuroxime, cephalexin, and loracarbef when used for therapy of uncomplicated urinary tract infections due to E. coli, Klebsiella pneumoniae, and Proteus mirabilis.    ANTIMICROBIAL SUSCEPTIBILITY Comment  Final    Comment:       ** S = Susceptible; I = Intermediate; R = Resistant **                    P = Positive; N = Negative             MICS are expressed in micrograms per mL    Antibiotic                 RSLT#1    RSLT#2    RSLT#3    RSLT#4 Amoxicillin/Clavulanic Acid    S Ampicillin                     S Cefepime                       S Ceftriaxone                    S Cefuroxime                     S Cephalothin                    S Ciprofloxacin                  S Ertapenem                      S Gentamicin                     S Imipenem                       S Levofloxacin                   S Nitrofurantoin  S Piperacillin                   S Tetracycline                   S Tobramycin                     S Trimethoprim/Sulfa             S   Culture, blood (x 2)     Status: Abnormal   Collection Time: 06/21/16  7:20 PM  Result Value Ref Range Status   Specimen Description BLOOD RIGHT HAND  Final   Special Requests   Final    BOTTLES DRAWN AEROBIC AND ANAEROBIC Blood Culture adequate volume   Culture  Setup Time   Final    GRAM NEGATIVE RODS IN BOTH AEROBIC AND ANAEROBIC BOTTLES CRITICAL RESULT CALLED TO, READ BACK BY AND VERIFIED WITH: N. BATCHELDER PHARM 06/22/16 0838 BEAMJ    Culture (A)  Final    ESCHERICHIA COLI SUSCEPTIBILITIES PERFORMED ON PREVIOUS CULTURE WITHIN  THE LAST 5 DAYS.    Report Status 06/24/2016 FINAL  Final  Culture, blood (x 2)     Status: Abnormal   Collection Time: 06/21/16  7:23 PM  Result Value Ref Range Status   Specimen Description BLOOD LEFT ANTECUBITAL  Final   Special Requests   Final    BOTTLES DRAWN AEROBIC AND ANAEROBIC Blood Culture adequate volume   Culture  Setup Time   Final    GRAM NEGATIVE RODS IN BOTH AEROBIC AND ANAEROBIC BOTTLES CRITICAL RESULT CALLED TO, READ BACK BY AND VERIFIED WITH: N BATCHELDER PHARM 05.4.18 0838 BEAMJ    Culture ESCHERICHIA COLI (A)  Final   Report Status 06/24/2016 FINAL  Final   Organism ID, Bacteria ESCHERICHIA COLI  Final      Susceptibility   Escherichia coli - MIC*    AMPICILLIN <=2 SENSITIVE Sensitive     CEFAZOLIN <=4 SENSITIVE Sensitive     CEFEPIME <=1 SENSITIVE Sensitive     CEFTAZIDIME <=1 SENSITIVE Sensitive     CEFTRIAXONE <=1 SENSITIVE Sensitive     CIPROFLOXACIN <=0.25 SENSITIVE Sensitive     GENTAMICIN <=1 SENSITIVE Sensitive     IMIPENEM <=0.25 SENSITIVE Sensitive     TRIMETH/SULFA <=20 SENSITIVE Sensitive     AMPICILLIN/SULBACTAM <=2 SENSITIVE Sensitive     PIP/TAZO <=4 SENSITIVE Sensitive     Extended ESBL NEGATIVE Sensitive     * ESCHERICHIA COLI  Blood Culture ID Panel (Reflexed)     Status: Abnormal   Collection Time: 06/21/16  7:23 PM  Result Value Ref Range Status   Enterococcus species NOT DETECTED NOT DETECTED Final   Listeria monocytogenes NOT DETECTED NOT DETECTED Final   Staphylococcus species NOT DETECTED NOT DETECTED Final   Staphylococcus aureus NOT DETECTED NOT DETECTED Final   Streptococcus species NOT DETECTED NOT DETECTED Final   Streptococcus agalactiae NOT DETECTED NOT DETECTED Final   Streptococcus pneumoniae NOT DETECTED NOT DETECTED Final   Streptococcus pyogenes NOT DETECTED NOT DETECTED Final   Acinetobacter baumannii NOT DETECTED NOT DETECTED Final   Enterobacteriaceae species DETECTED (A) NOT DETECTED Final    Comment:  Enterobacteriaceae represent a large family of gram-negative bacteria, not a single organism. CRITICAL RESULT CALLED TO, READ BACK BY AND VERIFIED WITH: N. BATCHELDER PHARM 06/22/2016 0838 BEAM    Enterobacter cloacae complex NOT DETECTED NOT DETECTED Final   Escherichia coli DETECTED (A) NOT DETECTED Final  Comment: CRITICAL RESULT CALLED TO, READ BACK BY AND VERIFIED WITH: N. BATCHELDER PHARM 06/22/2016 0838 BEAMJ    Klebsiella oxytoca NOT DETECTED NOT DETECTED Final   Klebsiella pneumoniae NOT DETECTED NOT DETECTED Final   Proteus species NOT DETECTED NOT DETECTED Final   Serratia marcescens NOT DETECTED NOT DETECTED Final   Carbapenem resistance NOT DETECTED NOT DETECTED Final   Haemophilus influenzae NOT DETECTED NOT DETECTED Final   Neisseria meningitidis NOT DETECTED NOT DETECTED Final   Pseudomonas aeruginosa NOT DETECTED NOT DETECTED Final   Candida albicans NOT DETECTED NOT DETECTED Final   Candida glabrata NOT DETECTED NOT DETECTED Final   Candida krusei NOT DETECTED NOT DETECTED Final   Candida parapsilosis NOT DETECTED NOT DETECTED Final   Candida tropicalis NOT DETECTED NOT DETECTED Final  Culture, Urine     Status: Abnormal   Collection Time: 06/21/16  7:51 PM  Result Value Ref Range Status   Specimen Description URINE, CLEAN CATCH  Final   Special Requests NONE  Final   Culture >=100,000 COLONIES/mL ESCHERICHIA COLI (A)  Final   Report Status 06/23/2016 FINAL  Final   Organism ID, Bacteria ESCHERICHIA COLI (A)  Final      Susceptibility   Escherichia coli - MIC*    AMPICILLIN <=2 SENSITIVE Sensitive     CEFAZOLIN <=4 SENSITIVE Sensitive     CEFTRIAXONE <=1 SENSITIVE Sensitive     CIPROFLOXACIN <=0.25 SENSITIVE Sensitive     GENTAMICIN <=1 SENSITIVE Sensitive     IMIPENEM <=0.25 SENSITIVE Sensitive     NITROFURANTOIN <=16 SENSITIVE Sensitive     TRIMETH/SULFA <=20 SENSITIVE Sensitive     AMPICILLIN/SULBACTAM <=2 SENSITIVE Sensitive     PIP/TAZO <=4 SENSITIVE  Sensitive     Extended ESBL NEGATIVE Sensitive     * >=100,000 COLONIES/mL ESCHERICHIA COLI  Culture, blood (Routine X 2) w Reflex to ID Panel     Status: Abnormal   Collection Time: 06/24/16 10:58 AM  Result Value Ref Range Status   Specimen Description BLOOD LEFT ARM  Final   Special Requests IN PEDIATRIC BOTTLE Blood Culture adequate volume  Final   Culture  Setup Time   Final    GRAM NEGATIVE RODS IN PEDIATRIC BOTTLE CRITICAL RESULT CALLED TO, READ BACK BY AND VERIFIED WITH: J. Palmer PHARMD, AT 1610 06/25/16 BY D. VANHOOK    Culture (A)  Final    ESCHERICHIA COLI SUSCEPTIBILITIES PERFORMED ON PREVIOUS CULTURE WITHIN THE LAST 5 DAYS.    Report Status 06/26/2016 FINAL  Final  Culture, blood (Routine X 2) w Reflex to ID Panel     Status: None (Preliminary result)   Collection Time: 06/24/16 10:58 AM  Result Value Ref Range Status   Specimen Description BLOOD LEFT ARM  Final   Special Requests IN PEDIATRIC BOTTLE Blood Culture adequate volume  Final   Culture NO GROWTH 4 DAYS  Final   Report Status PENDING  Incomplete  Blood Culture ID Panel (Reflexed)     Status: Abnormal   Collection Time: 06/24/16 10:58 AM  Result Value Ref Range Status   Enterococcus species NOT DETECTED NOT DETECTED Final   Listeria monocytogenes NOT DETECTED NOT DETECTED Final   Staphylococcus species NOT DETECTED NOT DETECTED Final   Staphylococcus aureus NOT DETECTED NOT DETECTED Final   Streptococcus species NOT DETECTED NOT DETECTED Final   Streptococcus agalactiae NOT DETECTED NOT DETECTED Final   Streptococcus pneumoniae NOT DETECTED NOT DETECTED Final   Streptococcus pyogenes NOT DETECTED NOT DETECTED Final  Acinetobacter baumannii NOT DETECTED NOT DETECTED Final   Enterobacteriaceae species DETECTED (A) NOT DETECTED Final    Comment: Enterobacteriaceae represent a large family of gram-negative bacteria, not a single organism. CRITICAL RESULT CALLED TO, READ BACK BY AND VERIFIED WITH: Joette Catching  PHARMD, AT [phone] 06/25/16 BY D. VANHOOK    Enterobacter cloacae complex NOT DETECTED NOT DETECTED Final   Escherichia coli DETECTED (A) NOT DETECTED Final    Comment: CRITICAL RESULT CALLED TO, READ BACK BY AND VERIFIED WITH: Joette Catching PHARMD, AT 1914 06/25/16 BY D. VANHOOK    Klebsiella oxytoca NOT DETECTED NOT DETECTED Final   Klebsiella pneumoniae NOT DETECTED NOT DETECTED Final   Proteus species NOT DETECTED NOT DETECTED Final   Serratia marcescens NOT DETECTED NOT DETECTED Final   Carbapenem resistance NOT DETECTED NOT DETECTED Final   Haemophilus influenzae NOT DETECTED NOT DETECTED Final   Neisseria meningitidis NOT DETECTED NOT DETECTED Final   Pseudomonas aeruginosa NOT DETECTED NOT DETECTED Final   Candida albicans NOT DETECTED NOT DETECTED Final   Candida glabrata NOT DETECTED NOT DETECTED Final   Candida krusei NOT DETECTED NOT DETECTED Final   Candida parapsilosis NOT DETECTED NOT DETECTED Final   Candida tropicalis NOT DETECTED NOT DETECTED Final  Aerobic/Anaerobic Culture (surgical/deep wound)     Status: None (Preliminary result)   Collection Time: 06/26/16  4:05 PM  Result Value Ref Range Status   Specimen Description WOUND BACK  Final   Special Requests SUBFASCIAL/SPECIMEN A  Final   Gram Stain   Final    DEGENERATED CELLULAR MATERIAL PRESENT NO ORGANISMS SEEN    Culture   Final    FEW ESCHERICHIA COLI NO ANAEROBES ISOLATED; CULTURE IN PROGRESS FOR 5 DAYS    Report Status PENDING  Incomplete   Organism ID, Bacteria ESCHERICHIA COLI  Final      Susceptibility   Escherichia coli - MIC*    AMPICILLIN <=2 SENSITIVE Sensitive     CEFAZOLIN <=4 SENSITIVE Sensitive     CEFEPIME <=1 SENSITIVE Sensitive     CEFTAZIDIME <=1 SENSITIVE Sensitive     CEFTRIAXONE <=1 SENSITIVE Sensitive     CIPROFLOXACIN <=0.25 SENSITIVE Sensitive     GENTAMICIN <=1 SENSITIVE Sensitive     IMIPENEM <=0.25 SENSITIVE Sensitive     TRIMETH/SULFA <=20 SENSITIVE Sensitive      AMPICILLIN/SULBACTAM <=2 SENSITIVE Sensitive     PIP/TAZO <=4 SENSITIVE Sensitive     Extended ESBL NEGATIVE Sensitive     * FEW ESCHERICHIA COLI  Aerobic/Anaerobic Culture (surgical/deep wound)     Status: None (Preliminary result)   Collection Time: 06/26/16  4:08 PM  Result Value Ref Range Status   Specimen Description WOUND BACK  Final   Special Requests EPIDURAL/SPECIMEN B  Final   Gram Stain   Final    DEGENERATED CELLULAR MATERIAL PRESENT NO ORGANISMS SEEN    Culture   Final    FEW ESCHERICHIA COLI NO ANAEROBES ISOLATED; CULTURE IN PROGRESS FOR 5 DAYS    Report Status PENDING  Incomplete   Organism ID, Bacteria ESCHERICHIA COLI  Final      Susceptibility   Escherichia coli - MIC*    AMPICILLIN <=2 SENSITIVE Sensitive     CEFAZOLIN <=4 SENSITIVE Sensitive     CEFEPIME <=1 SENSITIVE Sensitive     CEFTAZIDIME <=1 SENSITIVE Sensitive     CEFTRIAXONE <=1 SENSITIVE Sensitive     CIPROFLOXACIN <=0.25 SENSITIVE Sensitive     GENTAMICIN <=1 SENSITIVE Sensitive     IMIPENEM <=0.25  SENSITIVE Sensitive     TRIMETH/SULFA <=20 SENSITIVE Sensitive     AMPICILLIN/SULBACTAM <=2 SENSITIVE Sensitive     PIP/TAZO <=4 SENSITIVE Sensitive     Extended ESBL NEGATIVE Sensitive     * FEW ESCHERICHIA COLI  Aerobic/Anaerobic Culture (surgical/deep wound)     Status: None (Preliminary result)   Collection Time: 06/26/16  4:17 PM  Result Value Ref Range Status   Specimen Description WOUND BACK  Final   Special Requests SPECIMEN C/()EPIDURAL  Final   Gram Stain   Final    ABUNDANT WBC PRESENT,BOTH PMN AND MONONUCLEAR NO ORGANISMS SEEN    Culture   Final    FEW ESCHERICHIA COLI NO ANAEROBES ISOLATED; CULTURE IN PROGRESS FOR 5 DAYS    Report Status PENDING  Incomplete   Organism ID, Bacteria ESCHERICHIA COLI  Final      Susceptibility   Escherichia coli - MIC*    AMPICILLIN <=2 SENSITIVE Sensitive     CEFAZOLIN <=4 SENSITIVE Sensitive     CEFEPIME <=1 SENSITIVE Sensitive     CEFTAZIDIME  <=1 SENSITIVE Sensitive     CEFTRIAXONE <=1 SENSITIVE Sensitive     CIPROFLOXACIN <=0.25 SENSITIVE Sensitive     GENTAMICIN <=1 SENSITIVE Sensitive     IMIPENEM <=0.25 SENSITIVE Sensitive     TRIMETH/SULFA <=20 SENSITIVE Sensitive     AMPICILLIN/SULBACTAM <=2 SENSITIVE Sensitive     PIP/TAZO <=4 SENSITIVE Sensitive     Extended ESBL NEGATIVE Sensitive     * FEW ESCHERICHIA COLI  MRSA PCR Screening     Status: None   Collection Time: 06/26/16  6:30 PM  Result Value Ref Range Status   MRSA by PCR NEGATIVE NEGATIVE Final    Comment:        The GeneXpert MRSA Assay (FDA approved for NASAL specimens only), is one component of a comprehensive MRSA colonization surveillance program. It is not intended to diagnose MRSA infection nor to guide or monitor treatment for MRSA infections.   Culture, blood (routine x 2)     Status: None (Preliminary result)   Collection Time: 06/27/16  9:35 AM  Result Value Ref Range Status   Specimen Description BLOOD LEFT HAND  Final   Special Requests IN PEDIATRIC BOTTLE Blood Culture adequate volume  Final   Culture NO GROWTH 1 DAY  Final   Report Status PENDING  Incomplete  Culture, blood (routine x 2)     Status: None (Preliminary result)   Collection Time: 06/27/16  9:37 AM  Result Value Ref Range Status   Specimen Description BLOOD LEFT HAND  Final   Special Requests IN PEDIATRIC BOTTLE Blood Culture adequate volume  Final   Culture NO GROWTH 1 DAY  Final   Report Status PENDING  Incomplete    Studies/Results: Dg Chest Port 1 View  Result Date: 06/28/2016 CLINICAL DATA:  Dyspnea. EXAM: PORTABLE CHEST 1 VIEW COMPARISON:  Radiograph of Jun 24, 2016. FINDINGS: Stable cardiomegaly. Status post coronary artery bypass graft. No pneumothorax or significant pleural effusion is noted. Stable minimal subsegmental atelectasis is noted in the right midlung and left lingular regions. Bony thorax is unremarkable. IMPRESSION: Stable minimal bilateral  subsegmental atelectasis. Electronically Signed   By: Lupita Raider, M.D.   On: 06/28/2016 07:49     Assessment/Plan: E coli bacteremia Paraspinal and epidural abscess  E coli UCx  Total days of antibiotics: 8  Would continue ceftriaxone to day 42 Place PIC Please see note from 5-10 for d/c anbx, end date.  Explained to pt.  Available as needed         Johny Sax Infectious Diseases (pager) [phone] [email or web address] 06/29/2016, 10:33 AM  LOS: 8 days

## 2016-06-29 NOTE — Progress Notes (Signed)
Patient ID: Erin [name], female   DOB: 10-11-1945, 71 y.o.   MRN: [id number] Overall patient improving improved mental status and fever curve now white count down the condition of back pain but no leg pain  Strength 5 out of 5 wound clean dry and intact  Wear the brace for the patient's out of bed and ambulating continue to observe if the back pain persists without improvement over the next several days consider MRI scan repeat in the beginning of the week but the drain still continues to put out more than what I think it is safe to discontinue target 30 mL over 8 hour shift.

## 2016-06-30 LAB — CBC
HCT: 28.4 % — ABNORMAL LOW (ref 36.0–46.0)
Hemoglobin: 9 g/dL — ABNORMAL LOW (ref 12.0–15.0)
MCH: 26.5 pg (ref 26.0–34.0)
MCHC: 31.7 g/dL (ref 30.0–36.0)
MCV: 83.5 fL (ref 78.0–100.0)
Platelets: 171 10*3/uL (ref 150–400)
RBC: 3.4 MIL/uL — ABNORMAL LOW (ref 3.87–5.11)
RDW: 17 % — ABNORMAL HIGH (ref 11.5–15.5)
WBC: 11.4 10*3/uL — ABNORMAL HIGH (ref 4.0–10.5)

## 2016-06-30 LAB — RENAL FUNCTION PANEL
Albumin: 1.7 g/dL — ABNORMAL LOW (ref 3.5–5.0)
Anion gap: 8 (ref 5–15)
BUN: 25 mg/dL — ABNORMAL HIGH (ref 6–20)
CO2: 25 mmol/L (ref 22–32)
Calcium: 8.6 mg/dL — ABNORMAL LOW (ref 8.9–10.3)
Chloride: 98 mmol/L — ABNORMAL LOW (ref 101–111)
Creatinine, Ser: 1.23 mg/dL — ABNORMAL HIGH (ref 0.44–1.00)
GFR calc Af Amer: 50 mL/min — ABNORMAL LOW (ref >60)
GFR calc non Af Amer: 43 mL/min — ABNORMAL LOW (ref >60)
Glucose, Bld: 113 mg/dL — ABNORMAL HIGH (ref 65–99)
Phosphorus: 3.3 mg/dL (ref 2.5–4.6)
Potassium: 3.8 mmol/L (ref 3.5–5.1)
Sodium: 131 mmol/L — ABNORMAL LOW (ref 135–145)

## 2016-06-30 MED ORDER — POTASSIUM CHLORIDE CRYS ER 20 MEQ PO TBCR
20.0000 meq | EXTENDED_RELEASE_TABLET | Freq: Once | ORAL | Status: AC
  Administered 2016-06-30: 20 meq via ORAL
  Filled 2016-06-30: qty 1
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
## 2016-06-30 NOTE — Progress Notes (Signed)
Progress Note  Patient Name: Erin [name] Date of Encounter: 06/30/2016  Primary Cardiologist:   Dr. Tresa Endo  Subjective   No SOB.  No palpitatoins  Inpatient Medications    Scheduled Meds: . apixaban  5 mg Oral BID  . aspirin EC  81 mg Oral Daily  . atorvastatin  80 mg Oral q1800  . cholecalciferol  2,000 Units Oral QPC breakfast  . cycloSPORINE  1 drop Both Eyes BID  . furosemide  40 mg Intravenous BID  . levothyroxine  112 mcg Oral QAC breakfast  . metoprolol tartrate  37.5 mg Oral BID  . multivitamin with minerals  1 tablet Oral Daily  . pantoprazole  40 mg Oral QHS  . polyethylene glycol  17 g Oral BID  . senna-docusate  2 tablet Oral BID  . sodium chloride flush  3 mL Intravenous Q12H  . sodium chloride flush  3 mL Intravenous Q12H   Continuous Infusions: . sodium chloride    . amiodarone 30 mg/hr (06/30/16 1259)  . cefTRIAXone (ROCEPHIN)  IV Stopped (06/30/16 0920)   PRN Meds: acetaminophen **OR** acetaminophen, alum & mag hydroxide-simeth, cyclobenzaprine, fentaNYL (SUBLIMAZE) injection, HYDROcodone-acetaminophen, ipratropium-albuterol, levalbuterol, menthol-cetylpyridinium **OR** phenol, metoprolol, ondansetron **OR** ondansetron (ZOFRAN) IV, sodium chloride flush, sodium chloride flush, white petrolatum, zolpidem   Vital Signs    Vitals:   06/29/16 0559 06/29/16 1400 06/29/16 2031 06/30/16 0356  BP: 121/80 118/72 (!) 143/101 136/85  Pulse: (!) 134 (!) 115 (!) 135 (!) 127  Resp: 18 17 20 20   Temp: 98.4 F (36.9 C) 98.6 F (37 C) 99.6 F (37.6 C) 98.2 F (36.8 C)  TempSrc: Oral Oral Oral Oral  SpO2: 99% 98% 100% 100%  Weight: 192 lb 7.2 oz (87.3 kg)   192 lb 11.2 oz (87.4 kg)  Height:        Intake/Output Summary (Last 24 hours) at 06/30/16 1327 Last data filed at 06/30/16 0921  Gross per 24 hour  Intake              787 ml  Output             1935 ml  Net            -1148 ml   Filed Weights   06/28/16 0531 06/29/16 0559 06/30/16 0356  Weight:  193 lb 8 oz (87.8 kg) 192 lb 7.2 oz (87.3 kg) 192 lb 11.2 oz (87.4 kg)    Telemetry    Atrial flutter with controlled rate - Personally Reviewed  ECG    NA - Personally Reviewed  Physical Exam   GEN: No acute distress.   Neck: No  JVD Cardiac: IrregularRR, no murmurs, rubs, or gallops.  Respiratory: Clear  to auscultation bilaterally. GI: Soft, nontender, non-distended  MS: Moderate diffuse edema; No deformity. Neuro:  Nonfocal  Psych: Normal affect   Labs    Chemistry Recent Labs Lab 06/28/16 0247 06/29/16 0341 06/30/16 0337  NA 128* 130* 131*  K 3.7 3.9 3.8  CL 95* 99* 98*  CO2 23 24 25   GLUCOSE 163* 108* 113*  BUN 36* 33* 25*  CREATININE 1.81* 1.56* 1.23*  CALCIUM 8.3* 8.6* 8.6*  ALBUMIN 1.6* 1.8* 1.7*  GFRNONAA 27* 32* 43*  GFRAA 31* 37* 50*  ANIONGAP 10 7 8      Hematology Recent Labs Lab 06/28/16 0247 06/29/16 0341 06/30/16 0337  WBC 20.6* 14.1* 11.4*  RBC 2.67* 3.59* 3.40*  HGB 7.0* 9.5* 9.0*  HCT 21.5* 29.5* 28.4*  MCV  80.5 82.2 83.5  MCH 26.2 26.5 26.5  MCHC 32.6 32.2 31.7  RDW 18.5* 16.6* 17.0*  PLT 245 176 171    Cardiac Enzymes Recent Labs Lab 06/26/16 2235 06/27/16 0457 06/27/16 0937 06/27/16 1703  TROPONINI 0.04* <0.03 <0.03 0.37*   No results for input(s): TROPIPOC in the last 168 hours.   BNPNo results for input(s): BNP, PROBNP in the last 168 hours.   DDimer No results for input(s): DDIMER in the last 168 hours.   Radiology    Dg Chest Port 1 View  Result Date: 06/29/2016 CLINICAL DATA:  Left arm PICC placement. EXAM: PORTABLE CHEST 1 VIEW COMPARISON:  06/28/2016 FINDINGS: Left arm PICC tip is in the SVC 2.5 cm above the right atrium. Mild atelectasis in the lower lobes, slightly worsened since yesterday's study. Upper lungs remain clear. IMPRESSION: Left arm PICC tip in the SVC 2.5 cm above the right atrium. Slightly more atelectasis in the lower lungs. Electronically Signed   By: Paulina Fusi M.D.   On: 06/29/2016 12:31      Cardiac Studies   NA  Patient Profile     71 y.o. female with a h/o CAD s/p recent CABG 05/30/16 as well a h/o PAF treated with amiodarone, who is being seen today for atrial fibrillation medication management and elevated troponin  Assessment & Plan    ATRIAL FIB:  Rate is OK.  I would like to continue the IV amiodarone for now.  Eliquis has been restarted.   ELEVATED TROPONIN:    Demand ischemia in the setting of atrial fib.    SLEEP APNEA:  Likely.  She will need an out patient sleep study.       Signed, Rollene Rotunda, MD  06/30/2016, 1:27 PM

## 2016-06-30 NOTE — Discharge Instructions (Signed)

## 2016-06-30 NOTE — Progress Notes (Signed)
Pt seen and examined.  No issues overnight. Reports continued low back spasms. Flexeril helps. Denies back pain otherwise or radicular symptoms. No concerns  EXAM: Temp:  [98.2 F (36.8 C)-99.6 F (37.6 C)] 98.2 F (36.8 C) (05/12 0356) Pulse Rate:  [115-135] 127 (05/12 0356) Resp:  [17-20] 20 (05/12 0356) BP: (118-143)/(72-101) 136/85 (05/12 0356) SpO2:  [98 %-100 %] 100 % (05/12 0356) Weight:  [87.4 kg (192 lb 11.2 oz)] 87.4 kg (192 lb 11.2 oz) (05/12 0356) Intake/Output      05/11 0701 - 05/12 0700 05/12 0701 - 05/13 0700   P.O. 480    I.V. (mL/kg) 104 (1.2)    Blood     Other 200    Total Intake(mL/kg) 784 (9)    Urine (mL/kg/hr) 1800 (0.9)    Drains 115 (0.1)    Total Output 1915     Net -1131           Awake and alert Follows commands throughout Full strength Wound c/d/i  Stable. Continue current care.

## 2016-07-01 LAB — AEROBIC/ANAEROBIC CULTURE W GRAM STAIN (SURGICAL/DEEP WOUND)

## 2016-07-01 LAB — TROPONIN I
Troponin I: <0.03 ng/mL (ref <0.03)
Troponin I: <0.03 ng/mL (ref <0.03)

## 2016-07-01 LAB — AEROBIC/ANAEROBIC CULTURE (SURGICAL/DEEP WOUND)

## 2016-07-01 LAB — RENAL FUNCTION PANEL
Albumin: 1.7 g/dL — ABNORMAL LOW (ref 3.5–5.0)
Anion gap: 8 (ref 5–15)
BUN: 17 mg/dL (ref 6–20)
CO2: 29 mmol/L (ref 22–32)
Calcium: 8.7 mg/dL — ABNORMAL LOW (ref 8.9–10.3)
Chloride: 95 mmol/L — ABNORMAL LOW (ref 101–111)
Creatinine, Ser: 1.1 mg/dL — ABNORMAL HIGH (ref 0.44–1.00)
GFR calc Af Amer: 57 mL/min — ABNORMAL LOW (ref >60)
GFR calc non Af Amer: 49 mL/min — ABNORMAL LOW (ref >60)
Glucose, Bld: 118 mg/dL — ABNORMAL HIGH (ref 65–99)
Phosphorus: 3.5 mg/dL (ref 2.5–4.6)
Potassium: 3.6 mmol/L (ref 3.5–5.1)
Sodium: 132 mmol/L — ABNORMAL LOW (ref 135–145)

## 2016-07-01 MED ORDER — METOPROLOL TARTRATE 25 MG PO TABS
37.5000 mg | ORAL_TABLET | Freq: Three times a day (TID) | ORAL | Status: DC
  Administered 2016-07-01 – 2016-07-03 (×6): 37.5 mg via ORAL
  Filled 2016-07-01 (×6): qty 1

## 2016-07-01 MED ORDER — AMIODARONE HCL 200 MG PO TABS
400.0000 mg | ORAL_TABLET | Freq: Two times a day (BID) | ORAL | Status: AC
  Administered 2016-07-01 – 2016-07-07 (×14): 400 mg via ORAL
  Filled 2016-07-01 (×14): qty 2

## 2016-07-01 MED ORDER — POTASSIUM CHLORIDE CRYS ER 20 MEQ PO TBCR
20.0000 meq | EXTENDED_RELEASE_TABLET | Freq: Two times a day (BID) | ORAL | Status: AC
  Administered 2016-07-01 (×2): 20 meq via ORAL
  Filled 2016-07-01 (×3): qty 1

## 2016-07-01 NOTE — Progress Notes (Signed)
Patient ambulated with front wheeled walker 200 feet without difficulty.

## 2016-07-01 NOTE — Progress Notes (Signed)
Pt seen and examined.  No issues overnight. Reports back pain is stable. Denies radicular sx.  EXAM: Temp:  [97.4 F (36.3 C)-98.7 F (37.1 C)] 98.2 F (36.8 C) (05/13 0405) Pulse Rate:  [94-129] 129 (05/13 0405) Resp:  [18-20] 18 (05/13 0405) BP: (118-140)/(73-84) 118/73 (05/13 0405) SpO2:  [93 %-98 %] 98 % (05/13 0405) Weight:  [80.2 kg (176 lb 14.4 oz)] 80.2 kg (176 lb 14.4 oz) (05/13 0622) Intake/Output      05/12 0701 - 05/13 0700 05/13 0701 - 05/14 0700   P.O. 240 240   I.V. (mL/kg) 3 (0)    Other 300    IV Piggyback 50    Total Intake(mL/kg) 593 (7.4) 240 (3)   Urine (mL/kg/hr) 600 (0.3) 700 (2.3)   Drains 70 (0)    Stool  0 (0)   Total Output 670 700   Net -77 -460        Urine Occurrence  1 x   Stool Occurrence  1 x    Awake and alert Follows commands throughout MAEW. Strength stable.  Stable. Continue current care Will likely need MRI tomorrow

## 2016-07-01 NOTE — Progress Notes (Signed)
Progress Note  Patient Name: Erin [name] Date of Encounter: 07/01/2016  Primary Cardiologist:   Dr. Tresa Endo  Subjective   No SOB or chest pain.  Does not notice palpitations.  She has back pain.   Inpatient Medications    Scheduled Meds: . apixaban  5 mg Oral BID  . aspirin EC  81 mg Oral Daily  . atorvastatin  80 mg Oral q1800  . cholecalciferol  2,000 Units Oral QPC breakfast  . cycloSPORINE  1 drop Both Eyes BID  . furosemide  40 mg Intravenous BID  . levothyroxine  112 mcg Oral QAC breakfast  . metoprolol tartrate  37.5 mg Oral BID  . multivitamin with minerals  1 tablet Oral Daily  . pantoprazole  40 mg Oral QHS  . polyethylene glycol  17 g Oral BID  . potassium chloride  20 mEq Oral BID  . senna-docusate  2 tablet Oral BID  . sodium chloride flush  3 mL Intravenous Q12H  . sodium chloride flush  3 mL Intravenous Q12H   Continuous Infusions: . sodium chloride    . amiodarone 30 mg/hr (07/01/16 1255)  . cefTRIAXone (ROCEPHIN)  IV Stopped (07/01/16 0910)   PRN Meds: acetaminophen **OR** acetaminophen, alum & mag hydroxide-simeth, cyclobenzaprine, fentaNYL (SUBLIMAZE) injection, HYDROcodone-acetaminophen, ipratropium-albuterol, levalbuterol, menthol-cetylpyridinium **OR** phenol, metoprolol, ondansetron **OR** ondansetron (ZOFRAN) IV, sodium chloride flush, sodium chloride flush, white petrolatum, zolpidem   Vital Signs    Vitals:   06/30/16 1800 06/30/16 1940 07/01/16 0405 07/01/16 0622  BP:  131/82 118/73   Pulse:  (!) 113 (!) 129   Resp:   18   Temp: 98.2 F (36.8 C) 98.7 F (37.1 C) 98.2 F (36.8 C)   TempSrc: Oral Oral Oral   SpO2:  93% 98%   Weight:    176 lb 14.4 oz (80.2 kg)  Height:        Intake/Output Summary (Last 24 hours) at 07/01/16 1428 Last data filed at 07/01/16 1300  Gross per 24 hour  Intake              540 ml  Output             1360 ml  Net             -820 ml   Filed Weights   06/29/16 0559 06/30/16 0356 07/01/16 0622    Weight: 192 lb 7.2 oz (87.3 kg) 192 lb 11.2 oz (87.4 kg) 176 lb 14.4 oz (80.2 kg)    Telemetry    Atrial flutter with controlled rate- Personally Reviewed  ECG    NA - Personally Reviewed  Physical Exam   GEN: No distress Neck: No JVD Cardiac: IrregularRR, no murmurs or rubs Respiratory: Clear GI: Soft, nontender, non-distended, no rebound, no guarding MS: Moderate to severe diffuse edema. Neuro:  Nonfocal exam Psych: Normal affect and oriented  Labs    Chemistry  Recent Labs Lab 06/29/16 0341 06/30/16 0337 07/01/16 0429  NA 130* 131* 132*  K 3.9 3.8 3.6  CL 99* 98* 95*  CO2 24 25 29   GLUCOSE 108* 113* 118*  BUN 33* 25* 17  CREATININE 1.56* 1.23* 1.10*  CALCIUM 8.6* 8.6* 8.7*  ALBUMIN 1.8* 1.7* 1.7*  GFRNONAA 32* 43* 49*  GFRAA 37* 50* 57*  ANIONGAP 7 8 8      Hematology  Recent Labs Lab 06/28/16 0247 06/29/16 0341 06/30/16 0337  WBC 20.6* 14.1* 11.4*  RBC 2.67* 3.59* 3.40*  HGB 7.0* 9.5* 9.0*  HCT 21.5* 29.5* 28.4*  MCV 80.5 82.2 83.5  MCH 26.2 26.5 26.5  MCHC 32.6 32.2 31.7  RDW 18.5* 16.6* 17.0*  PLT 245 176 171    Cardiac Enzymes  Recent Labs Lab 06/26/16 2235 06/27/16 0457 06/27/16 0937 06/27/16 1703  TROPONINI 0.04* <0.03 <0.03 0.37*   No results for input(s): TROPIPOC in the last 168 hours.   BNPNo results for input(s): BNP, PROBNP in the last 168 hours.   DDimer No results for input(s): DDIMER in the last 168 hours.   Radiology    No results found.  Cardiac Studies   NA  Patient Profile     71 y.o. female with a h/o CAD s/p recent CABG 05/30/16 as well a h/o PAF treated with amiodarone, who is being seen today for atrial fibrillation medication management and elevated troponin  Assessment & Plan    ATRIAL FIB:  Rate is mildly elevated.  Eliquis has been restarted.   I will increase her beta blocker and change amiodarone to PO.   ELEVATED TROPONIN:    Demand ischemia in the setting of atrial fib.  I will continue to  cycle since it did go up.  However, she is not having chest pain.   SLEEP APNEA:  Likely.  She will need an out patient sleep study.  We will need to arrange this.      Signed, Rollene Rotunda, MD  07/01/2016, 2:28 PM
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
## 2016-07-02 DIAGNOSIS — I5033 Acute on chronic diastolic (congestive) heart failure: Secondary | ICD-10-CM

## 2016-07-02 LAB — CULTURE, BLOOD (ROUTINE X 2)
Culture: NO GROWTH
Culture: NO GROWTH
Special Requests: ADEQUATE
Special Requests: ADEQUATE

## 2016-07-02 LAB — CBC
HCT: 32.2 % — ABNORMAL LOW (ref 36.0–46.0)
Hemoglobin: 10.3 g/dL — ABNORMAL LOW (ref 12.0–15.0)
MCH: 27 pg (ref 26.0–34.0)
MCHC: 32 g/dL (ref 30.0–36.0)
MCV: 84.5 fL (ref 78.0–100.0)
Platelets: 180 10*3/uL (ref 150–400)
RBC: 3.81 MIL/uL — ABNORMAL LOW (ref 3.87–5.11)
RDW: 17.3 % — ABNORMAL HIGH (ref 11.5–15.5)
WBC: 13.8 10*3/uL — ABNORMAL HIGH (ref 4.0–10.5)

## 2016-07-02 LAB — RENAL FUNCTION PANEL
Albumin: 1.7 g/dL — ABNORMAL LOW (ref 3.5–5.0)
Anion gap: 9 (ref 5–15)
BUN: 15 mg/dL (ref 6–20)
CO2: 30 mmol/L (ref 22–32)
Calcium: 8.7 mg/dL — ABNORMAL LOW (ref 8.9–10.3)
Chloride: 94 mmol/L — ABNORMAL LOW (ref 101–111)
Creatinine, Ser: 1.07 mg/dL — ABNORMAL HIGH (ref 0.44–1.00)
GFR calc Af Amer: 59 mL/min — ABNORMAL LOW (ref >60)
GFR calc non Af Amer: 51 mL/min — ABNORMAL LOW (ref >60)
Glucose, Bld: 100 mg/dL — ABNORMAL HIGH (ref 65–99)
Phosphorus: 3.7 mg/dL (ref 2.5–4.6)
Potassium: 3.6 mmol/L (ref 3.5–5.1)
Sodium: 133 mmol/L — ABNORMAL LOW (ref 135–145)

## 2016-07-02 MED ORDER — POTASSIUM CHLORIDE CRYS ER 20 MEQ PO TBCR
20.0000 meq | EXTENDED_RELEASE_TABLET | Freq: Two times a day (BID) | ORAL | Status: AC
  Administered 2016-07-02 (×2): 20 meq via ORAL
  Filled 2016-07-02 (×2): qty 1

## 2016-07-02 MED ORDER — VALACYCLOVIR HCL 500 MG PO TABS
2000.0000 mg | ORAL_TABLET | Freq: Two times a day (BID) | ORAL | Status: AC
  Administered 2016-07-02 (×2): 2000 mg via ORAL
  Filled 2016-07-02 (×2): qty 4

## 2016-07-02 MED ORDER — DILTIAZEM HCL 30 MG PO TABS
30.0000 mg | ORAL_TABLET | Freq: Four times a day (QID) | ORAL | Status: DC
  Administered 2016-07-02 – 2016-07-03 (×4): 30 mg via ORAL
  Filled 2016-07-02 (×4): qty 1

## 2016-07-02 NOTE — Care Management Important Message (Signed)
Important Message  Patient Details  Name: Erin [name] MRN: [id number] Date of Birth: April 03, 1945   Medicare Important Message Given:  Yes    [name] 07/02/2016, 1:53 PM

## 2016-07-02 NOTE — Progress Notes (Signed)
ANTIBIOTIC CONSULT NOTE   Pharmacy Consult for Rocephin Indication: UTI/ E.coli bacteremia/spinal abscess  Allergies  Allergen Reactions  . Ace Inhibitors Swelling    ACE stopped after pt seen in ED with facial swelling- allergy testing pending  . Morphine And Related   . Codeine Nausea And Vomiting    Patient Measurements: Height: 5\' 3"  (160 cm) Weight: 189 lb (85.7 kg) IBW/kg (Calculated) : 52.4  Vital Signs: Temp: 98.5 F (36.9 C) (05/14 0252) Temp Source: Oral (05/14 0252) BP: 122/92 (05/14 0252) Pulse Rate: 126 (05/14 0252) Intake/Output from previous day: 05/13 0701 - 05/14 0700 In: 315 [P.O.:240; IV Piggyback:50] Out: 1391 [Urine:1350; Drains:40; Stool:1] Intake/Output from this shift: No intake/output data recorded.  Labs:  Recent Labs  06/30/16 0337 07/01/16 0429 07/02/16 0353  WBC 11.4*  --   --   HGB 9.0*  --   --   PLT 171  --   --   CREATININE 1.23* 1.10* 1.07*   Estimated Creatinine Clearance: 49.3 mL/min (A) (by C-G formula based on SCr of 1.07 mg/dL (H)). No results for input(s): VANCOTROUGH, VANCOPEAK, VANCORANDOM, GENTTROUGH, GENTPEAK, GENTRANDOM, TOBRATROUGH, TOBRAPEAK, TOBRARND, AMIKACINPEAK, AMIKACINTROU, AMIKACIN in the last 72 hours.   Microbiology:   Medical History: Past Medical History:  Diagnosis Date  . Arthritis    "all my back is eat up w/it; knees too" (05/24/2016)  . CAD (coronary artery disease)    s/p CABG  . Chronic bronchitis (HCC)   . Chronic lower back pain   . Dyspnea    "since OR 04/2016" (05/24/2016)  . Family history of adverse reaction to anesthesia    "daughter gets PONV" (05/24/2016)  . GERD (gastroesophageal reflux disease)    occ  . High cholesterol   . History of stomach ulcers   . Hypertension   . Hypothyroidism   . Migraine    "usually have one monthly; nothing since 04/25/2016)  . Pneumonia ~ 2002   Assessment:  ID: 71 yo female with E.coli bacteremia/spinal abscess on rocephin. Plans are for 6 weeks  antibiotics with end date 08/01/16.    Vanc 5/3 >>5/7, 5/8>>5/9 Zosyn 5/3 >> 5/7 CTX 5/7 >> (6/13)  5/8 wound A: few Ecoli S Rocephin 5/8 wound B: Ecoli 5/8: wound C: Ecoli 5/8 wound/tissue: ngtd-reincubate 5/6 BCID: EColi 5/6 Blood- 1/2 EColi 5/3 Blood- E coli pan sensitive  5/3 BCID - E coli 5/3 urine - E.coli pan sensitive  5/3 Influenza PCR negative  Plan:  - CTX 2g IV q12h (OPAT orders entered, ONCE daily Rocephin confirmed with [facility]) -Will sign off. Please contact pharmacy with any other needs.  Thank you, Harland German, Pharm D 07/02/2016 9:02 AM

## 2016-07-02 NOTE — Progress Notes (Addendum)
Patient ID: TERRACE GORSLINE, female   DOB: 1945/11/05, 71 y.o.   MRN: [id number] Patient seems to be improving with decreased back pain still with some muscle spasms,  swelling is goingdown in her legs no numbness or tingling. She is ambulating better.  Strength 5 out of 5 wound clean dry and intact and drain output put out 40 mL over the 12 hour shift last night.  I have discontinued her Hemovac drain continue mobilization with physical and occupational therapy continued IV antibiotics.   it's okay with me for thePatient 2be transferred to rehabilitation at any point I will hold off on MRI scan in the setting of patient's clinical improvement
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
## 2016-07-03 ENCOUNTER — Encounter: Payer: Self-pay | Admitting: Thoracic Surgery (Cardiothoracic Vascular Surgery)

## 2016-07-03 LAB — RENAL FUNCTION PANEL
Albumin: 1.8 g/dL — ABNORMAL LOW (ref 3.5–5.0)
Anion gap: 10 (ref 5–15)
BUN: 12 mg/dL (ref 6–20)
CO2: 30 mmol/L (ref 22–32)
Calcium: 8.6 mg/dL — ABNORMAL LOW (ref 8.9–10.3)
Chloride: 93 mmol/L — ABNORMAL LOW (ref 101–111)
Creatinine, Ser: 1.04 mg/dL — ABNORMAL HIGH (ref 0.44–1.00)
GFR calc Af Amer: >60 mL/min (ref >60)
GFR calc non Af Amer: 52 mL/min — ABNORMAL LOW (ref >60)
Glucose, Bld: 97 mg/dL (ref 65–99)
Phosphorus: 3.6 mg/dL (ref 2.5–4.6)
Potassium: 3.8 mmol/L (ref 3.5–5.1)
Sodium: 133 mmol/L — ABNORMAL LOW (ref 135–145)

## 2016-07-03 LAB — CBC
HCT: 29.9 % — ABNORMAL LOW (ref 36.0–46.0)
Hemoglobin: 9.3 g/dL — ABNORMAL LOW (ref 12.0–15.0)
MCH: 26.1 pg (ref 26.0–34.0)
MCHC: 31.1 g/dL (ref 30.0–36.0)
MCV: 84 fL (ref 78.0–100.0)
Platelets: 171 10*3/uL (ref 150–400)
RBC: 3.56 MIL/uL — ABNORMAL LOW (ref 3.87–5.11)
RDW: 16.8 % — ABNORMAL HIGH (ref 11.5–15.5)
WBC: 12.3 10*3/uL — ABNORMAL HIGH (ref 4.0–10.5)

## 2016-07-03 MED ORDER — METOPROLOL TARTRATE 50 MG PO TABS
50.0000 mg | ORAL_TABLET | Freq: Three times a day (TID) | ORAL | Status: DC
  Administered 2016-07-03 – 2016-07-04 (×3): 50 mg via ORAL
  Filled 2016-07-03 (×3): qty 1

## 2016-07-03 MED ORDER — POTASSIUM CHLORIDE CRYS ER 20 MEQ PO TBCR
20.0000 meq | EXTENDED_RELEASE_TABLET | Freq: Once | ORAL | Status: AC
  Administered 2016-07-03: 20 meq via ORAL
  Filled 2016-07-03: qty 1

## 2016-07-03 MED ORDER — DILTIAZEM HCL 60 MG PO TABS
60.0000 mg | ORAL_TABLET | Freq: Four times a day (QID) | ORAL | Status: DC
  Administered 2016-07-03 – 2016-07-04 (×4): 60 mg via ORAL
  Filled 2016-07-03 (×4): qty 1

## 2016-07-03 NOTE — Progress Notes (Signed)
      [address]       [place] [address]             [phone]      Just back from walking 26' with assistance C/o back spasms  BP 134/89 (BP Location: Right Arm)   Pulse (!) 131   Temp 98.8 F (37.1 C) (Oral)   Resp 18   Ht 5\' 3"  (1.6 m)   Wt 186 lb 14.4 oz (84.8 kg)   SpO2 97%   BMI 33.11 kg/m    Intake/Output Summary (Last 24 hours) at 07/03/16 0831 Last data filed at 07/03/16 0520  Gross per 24 hour  Intake              490 ml  Output             2400 ml  Net            -1910 ml    Chest and leg incisions OK Still has peripheral edema but diuresing well with IV lasix  Viviann Spare C. Dorris Fetch, MD [facility] [phone]
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
## 2016-07-03 NOTE — Progress Notes (Signed)
Clinical Social Worker met with patient at bedside to offer support and discuss patient discharge options. Patient stated she remembers CSW from previous admission. Patient gave CSW verbal permission to contact son and have son make discharge decisions. CSW reach out to patient son Sherren Mocha and Sherren Mocha stated that he has placed a hold on patients bed at [facility]. Sherren Mocha stated that he would like patient to return back to SNF since the family does not feel patient would be safe to discharge home. CSW has reached out to facility. CSW remains available for support and discharge needs.  Rhea Pink, MSW,  [place]
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
## 2016-07-03 NOTE — Progress Notes (Addendum)
 Progress Note  Patient Name: Erin [name] Date of Encounter: 07/03/2016  Primary Cardiologist: Dr. Kelly  Subjective   Feeling better.  Short of breath with ambulation   Inpatient Medications    Scheduled Meds: . amiodarone  400 mg Oral BID  . apixaban  5 mg Oral BID  . aspirin EC  81 mg Oral Daily  . atorvastatin  80 mg Oral q1800  . cholecalciferol  2,000 Units Oral QPC breakfast  . cycloSPORINE  1 drop Both Eyes BID  . diltiazem  60 mg Oral Q6H  . furosemide  40 mg Intravenous BID  . levothyroxine  112 mcg Oral QAC breakfast  . metoprolol tartrate  50 mg Oral TID  . multivitamin with minerals  1 tablet Oral Daily  . pantoprazole  40 mg Oral QHS  . polyethylene glycol  17 g Oral BID  . senna-docusate  2 tablet Oral BID  . sodium chloride flush  3 mL Intravenous Q12H  . sodium chloride flush  3 mL Intravenous Q12H   Continuous Infusions: . sodium chloride    . cefTRIAXone (ROCEPHIN)  IV 2 g (07/03/16 0931)   PRN Meds: acetaminophen **OR** acetaminophen, alum & mag hydroxide-simeth, cyclobenzaprine, fentaNYL (SUBLIMAZE) injection, HYDROcodone-acetaminophen, ipratropium-albuterol, levalbuterol, menthol-cetylpyridinium **OR** phenol, metoprolol tartrate, ondansetron **OR** ondansetron (ZOFRAN) IV, sodium chloride flush, sodium chloride flush, white petrolatum, zolpidem   Vital Signs    Vitals:   07/02/16 1406 07/02/16 2114 07/03/16 0451 07/03/16 0628  BP: 134/88 (!) 116/58  134/89  Pulse: (!) 118 (!) 102  (!) 131  Resp: 18 18  18  Temp:  98.7 F (37.1 C)  98.8 F (37.1 C)  TempSrc:  Oral  Oral  SpO2: 97% 97%  97%  Weight:   84.8 kg (186 lb 14.4 oz)   Height:        Intake/Output Summary (Last 24 hours) at 07/03/16 0958 Last data filed at 07/03/16 0520  Gross per 24 hour  Intake              490 ml  Output             2400 ml  Net            -1910 ml   Filed Weights   07/02/16 0252 07/02/16 1108 07/03/16 0451  Weight: 85.7 kg (189 lb) 86.2 kg (190 lb)  84.8 kg (186 lb 14.4 oz)    Telemetry    Atrial flutter.  Rates 80s-130s - Personally Reviewed  ECG    n/a - Personally Reviewed  Physical Exam   GEN: Mildly ill-appearing.  No acute distress.   Neck: No JVD Cardiac: Tachycardic.  Irregularly irregular.  no murmurs, rubs, or gallops.  Respiratory: Clear to auscultation bilaterally.  No crackles, wheezes or rhonchi GI: Soft, nontender, non-distended  MS: 2+ pitting edema to the knee bilaterally; No deformity. Neuro:  Nonfocal  Psych: Normal affect   Labs    Chemistry Recent Labs Lab 07/01/16 0429 07/02/16 0353 07/03/16 0508  NA 132* 133* 133*  K 3.6 3.6 3.8  CL 95* 94* 93*  CO2 29 30 30  GLUCOSE 118* 100* 97  BUN 17 15 12  CREATININE 1.10* 1.07* 1.04*  CALCIUM 8.7* 8.7* 8.6*  ALBUMIN 1.7* 1.7* 1.8*  GFRNONAA 49* 51* 52*  GFRAA 57* 59* >60  ANIONGAP 8 9 10     Hematology Recent Labs Lab 06/30/16 0337 07/02/16 0848 07/03/16 0508  WBC 11.4* 13.8* 12.3*  RBC 3.40* 3.81* 3.56*    HGB 9.0* 10.3* 9.3*  HCT 28.4* 32.2* 29.9*  MCV 83.5 84.5 84.0  MCH 26.5 27.0 26.1  MCHC 31.7 32.0 31.1  RDW 17.0* 17.3* 16.8*  PLT 171 180 171    Cardiac Enzymes Recent Labs Lab 06/27/16 0937 06/27/16 1703 07/01/16 1633 07/01/16 2253  TROPONINI <0.03 0.37* <0.03 <0.03   No results for input(s): TROPIPOC in the last 168 hours.   BNPNo results for input(s): BNP, PROBNP in the last 168 hours.   DDimer No results for input(s): DDIMER in the last 168 hours.   Radiology    No results found.  Cardiac Studies   Echo 05/27/16: Study Conclusions  - Left ventricle: The cavity size was normal. Systolic function was   normal. The estimated ejection fraction was in the range of 55%   to 60%. Wall motion was normal; there were no regional wall   motion abnormalities. Doppler parameters are consistent with   abnormal left ventricular relaxation (grade 1 diastolic   dysfunction). Doppler parameters are consistent with  elevated   ventricular end-diastolic filling pressure. - Aortic valve: Trileaflet; mildly thickened, mildly calcified   leaflets. Transvalvular velocity was within the normal range.   There was no stenosis. Valve area (VTI): 2.03 cm^2. Valve area   (Vmax): 1.63 cm^2. Valve area (Vmean): 1.62 cm^2. - Aortic root: The aortic root was normal in size. - Mitral valve: There was mild regurgitation. - Right ventricle: The cavity size was normal. Wall thickness was   normal. Systolic function was normal. - Right atrium: The atrium was normal in size. - Tricuspid valve: There was mild regurgitation. - Pulmonary arteries: Systolic pressure was within the normal   range. - Inferior vena cava: The vessel was normal in size. - Pericardium, extracardiac: There was no pericardial effusion.   Patient Profile     70 y.o. female with CAD s/p CABG, hypertension, hyperlipidemia and OSA here with sepsis and epidural/parvertebral abscesses.  Hospitalization is complicated by acute on chronic diastolic heart failure and atrial fibrillation with RVR.   Assessment & Plan    # Atrial flutter: Erin [name] remains in atrial flutter and rates are poorly-controlled.  We will increase metoprolol to 50mg q8h and diltiazem to 60mg q6h.  Continue amiodarone.  I think that she needs a cardioversion.  She has received 2400 mg of amiodarone.  She is unwilling to make a decision unless I talk with her son. I spoke with her son, Erin [name], who is in agreement.  Will plan for TEE/DCCV tomorrow at noon with Dr. Nishan.  Eliquis was held for 3 days prior to her back surgery on 06/26/16.  [facility] has been requested to perform a transesophageal echocardiogram and direct current cardioversion on Erin [name] for atrial flutter.  After careful review of history and examination, the risks and benefits of transesophageal echocardiogram have been explained including risks of esophageal damage, perforation (1:10,000 risk), bleeding,  pharyngeal hematoma as well as other potential complications associated with conscious sedation including aspiration, arrhythmia, respiratory failure and death. Alternatives to treatment were discussed, questions were answered. Patient is willing to proceed.   # CAD s/p CABG:  # Demand ischemia: No chest pain.  This is thought to be demand from infection and rapid atrial flutter.  Continue aspirin, atorvastatin, and metoprolol.  Consider stopping aspirin given that she is on apixaban.  # Volume overload 2/2 fluid resuscitation +/- diastolic dysfunction: Continue diuresis with lasix 40mg IV bid.  Renal function stable.  Will order Ted hose.      Signed, Maurion [place]ak [place], MD  07/03/2016, 9:58 AM    

## 2016-07-04 ENCOUNTER — Encounter (HOSPITAL_COMMUNITY)
Admission: AD | Disposition: A | Discharge status: Skilled Nursing Facility | Payer: Self-pay | Source: Ambulatory Visit | Attending: Family Medicine

## 2016-07-04 ENCOUNTER — Inpatient Hospital Stay (HOSPITAL_COMMUNITY): Payer: Medicare HMO | Admitting: Certified Registered Nurse Anesthetist

## 2016-07-04 ENCOUNTER — Inpatient Hospital Stay (HOSPITAL_COMMUNITY): Payer: Medicare HMO

## 2016-07-04 ENCOUNTER — Encounter (HOSPITAL_COMMUNITY): Payer: Self-pay | Admitting: *Deleted

## 2016-07-04 DIAGNOSIS — I481 Persistent atrial fibrillation: Secondary | ICD-10-CM

## 2016-07-04 DIAGNOSIS — I340 Nonrheumatic mitral (valve) insufficiency: Secondary | ICD-10-CM

## 2016-07-04 DIAGNOSIS — I4892 Unspecified atrial flutter: Secondary | ICD-10-CM

## 2016-07-04 DIAGNOSIS — I483 Typical atrial flutter: Secondary | ICD-10-CM

## 2016-07-04 DIAGNOSIS — I484 Atypical atrial flutter: Secondary | ICD-10-CM

## 2016-07-04 DIAGNOSIS — D649 Anemia, unspecified: Secondary | ICD-10-CM

## 2016-07-04 HISTORY — PX: TEE WITHOUT CARDIOVERSION: SHX5443

## 2016-07-04 HISTORY — PX: CARDIOVERSION: SHX1299

## 2016-07-04 LAB — RENAL FUNCTION PANEL
Albumin: 1.8 g/dL — ABNORMAL LOW (ref 3.5–5.0)
Anion gap: 9 (ref 5–15)
BUN: 10 mg/dL (ref 6–20)
CO2: 32 mmol/L (ref 22–32)
Calcium: 8.5 mg/dL — ABNORMAL LOW (ref 8.9–10.3)
Chloride: 90 mmol/L — ABNORMAL LOW (ref 101–111)
Creatinine, Ser: 1.06 mg/dL — ABNORMAL HIGH (ref 0.44–1.00)
GFR calc Af Amer: 59 mL/min — ABNORMAL LOW (ref >60)
GFR calc non Af Amer: 51 mL/min — ABNORMAL LOW (ref >60)
Glucose, Bld: 110 mg/dL — ABNORMAL HIGH (ref 65–99)
Phosphorus: 3.6 mg/dL (ref 2.5–4.6)
Potassium: 3.5 mmol/L (ref 3.5–5.1)
Sodium: 131 mmol/L — ABNORMAL LOW (ref 135–145)

## 2016-07-04 LAB — CBC
HCT: 27.8 % — ABNORMAL LOW (ref 36.0–46.0)
Hemoglobin: 8.7 g/dL — ABNORMAL LOW (ref 12.0–15.0)
MCH: 26.5 pg (ref 26.0–34.0)
MCHC: 31.3 g/dL (ref 30.0–36.0)
MCV: 84.8 fL (ref 78.0–100.0)
Platelets: 145 10*3/uL — ABNORMAL LOW (ref 150–400)
RBC: 3.28 MIL/uL — ABNORMAL LOW (ref 3.87–5.11)
RDW: 17 % — ABNORMAL HIGH (ref 11.5–15.5)
WBC: 13.4 10*3/uL — ABNORMAL HIGH (ref 4.0–10.5)

## 2016-07-04 LAB — OCCULT BLOOD X 1 CARD TO LAB, STOOL: Fecal Occult Bld: NEGATIVE

## 2016-07-04 SURGERY — ECHOCARDIOGRAM, TRANSESOPHAGEAL
Anesthesia: General

## 2016-07-04 MED ORDER — BUTAMBEN-TETRACAINE-BENZOCAINE 2-2-14 % EX AERO
INHALATION_SPRAY | CUTANEOUS | Status: DC | PRN
  Administered 2016-07-04: 2 via TOPICAL

## 2016-07-04 MED ORDER — PERFLUTREN LIPID MICROSPHERE
INTRAVENOUS | Status: DC | PRN
  Administered 2016-07-04: 3 mL via INTRAVENOUS

## 2016-07-04 MED ORDER — METOPROLOL TARTRATE 50 MG PO TABS
50.0000 mg | ORAL_TABLET | Freq: Two times a day (BID) | ORAL | Status: AC
  Administered 2016-07-04 – 2016-07-05 (×3): 50 mg via ORAL
  Filled 2016-07-04 (×3): qty 1

## 2016-07-04 MED ORDER — PROPOFOL 500 MG/50ML IV EMUL
INTRAVENOUS | Status: DC | PRN
  Administered 2016-07-04: 75 ug/kg/min via INTRAVENOUS

## 2016-07-04 MED ORDER — POTASSIUM CHLORIDE CRYS ER 20 MEQ PO TBCR
20.0000 meq | EXTENDED_RELEASE_TABLET | Freq: Two times a day (BID) | ORAL | Status: DC
  Administered 2016-07-04 – 2016-07-05 (×2): 20 meq via ORAL
  Filled 2016-07-04 (×2): qty 1

## 2016-07-04 MED ORDER — DILTIAZEM HCL ER COATED BEADS 120 MG PO CP24
120.0000 mg | ORAL_CAPSULE | Freq: Every day | ORAL | Status: DC
  Administered 2016-07-04 – 2016-07-19 (×16): 120 mg via ORAL
  Filled 2016-07-04 (×16): qty 1

## 2016-07-04 MED ORDER — PHENYLEPHRINE HCL 10 MG/ML IJ SOLN
INTRAMUSCULAR | Status: DC | PRN
  Administered 2016-07-04 (×2): 80 ug via INTRAVENOUS

## 2016-07-04 MED ORDER — PERFLUTREN LIPID MICROSPHERE
INTRAVENOUS | Status: AC
  Filled 2016-07-04: qty 10

## 2016-07-04 MED ORDER — PROPOFOL 10 MG/ML IV BOLUS
INTRAVENOUS | Status: DC | PRN
  Administered 2016-07-04: 20 mg via INTRAVENOUS

## 2016-07-04 NOTE — Interval H&P Note (Signed)
History and Physical Interval Note:  07/04/2016 10:20 AM  Erin [name]  has presented today for surgery, with the diagnosis of a fib  The various methods of treatment have been discussed with the patient and family. After consideration of risks, benefits and other options for treatment, the patient has consented to  Procedure(s): TRANSESOPHAGEAL ECHOCARDIOGRAM (TEE) (N/A) CARDIOVERSION (N/A) as a surgical intervention .  The patient's history has been reviewed, patient examined, no change in status, stable for surgery.  I have reviewed the patient's chart and labs.  Questions were answered to the patient's satisfaction.     Charlton Haws

## 2016-07-04 NOTE — Progress Notes (Signed)
  Echocardiogram Echocardiogram Transesophageal has been performed.  Delcie Roch 07/04/2016, 1:16 PM

## 2016-07-04 NOTE — Progress Notes (Addendum)
Patient ID: Erin [name], female   DOB: July 26, 1945, 71 y.o.   MRN: [id number] Will be out of town until Monday, ok for transfer to SNF when Medically cleared.  Call the office [phone] as needed for questions and concerns  Overall patient seems to be improving had a good day yesterday ambulating but her activity could've resulted in worsening back pain this morning that she is complaining of.  Strength is 5 out of 5 wound is clean dry and intact I remove the outer dressing cover up for showers only.  If no contraindication patient can be given light doses of steroids for inflammation if the pain doesn't settle down throughout the morning with pain medication and muscle relaxers.

## 2016-07-04 NOTE — Anesthesia Preprocedure Evaluation (Addendum)
Anesthesia Evaluation  Patient identified by MRN, date of birth, ID band Patient awake    Reviewed: Allergy & Precautions, H&P , Patient's Chart, lab work & pertinent test results, reviewed documented beta blocker date and time   Airway Mallampati: II  TM Distance: >3 FB Neck ROM: full    Dental no notable dental hx.    Pulmonary    Pulmonary exam normal breath sounds clear to auscultation       Cardiovascular hypertension,  Rhythm:regular Rate:Normal     Neuro/Psych    GI/Hepatic   Endo/Other    Renal/GU      Musculoskeletal   Abdominal   Peds  Hematology   Anesthesia Other Findings  Left ventricle: LV systolic function is low normal with an EF of 50-55%.  Septum: No Patent Foramen Ovale present.  Left atrium: Patent foramen ovale not present.  Aortic valve: The valve is bicuspid.  Mitral valve: Mild regurgitation.  HTN GERD  Reproductive/Obstetrics                             Anesthesia Physical Anesthesia Plan  ASA: III  Anesthesia Plan: General   Post-op Pain Management:    Induction: Intravenous  Airway Management Planned: Mask, Natural Airway and Simple Face Mask  Additional Equipment:   Intra-op Plan:   Post-operative Plan:   Informed Consent: I have reviewed the patients History and Physical, chart, labs and discussed the procedure including the risks, benefits and alternatives for the proposed anesthesia with the patient or authorized representative who has indicated his/her understanding and acceptance.   Dental Advisory Given  Plan Discussed with: CRNA and Surgeon  Anesthesia Plan Comments:        Anesthesia Quick Evaluation

## 2016-07-04 NOTE — H&P (View-Only) (Signed)
Progress Note  Patient Name: Erin [name] Date of Encounter: 07/03/2016  Primary Cardiologist: Dr. Tresa Endo  Subjective   Feeling better.  Short of breath with ambulation   Inpatient Medications    Scheduled Meds: . amiodarone  400 mg Oral BID  . apixaban  5 mg Oral BID  . aspirin EC  81 mg Oral Daily  . atorvastatin  80 mg Oral q1800  . cholecalciferol  2,000 Units Oral QPC breakfast  . cycloSPORINE  1 drop Both Eyes BID  . diltiazem  60 mg Oral Q6H  . furosemide  40 mg Intravenous BID  . levothyroxine  112 mcg Oral QAC breakfast  . metoprolol tartrate  50 mg Oral TID  . multivitamin with minerals  1 tablet Oral Daily  . pantoprazole  40 mg Oral QHS  . polyethylene glycol  17 g Oral BID  . senna-docusate  2 tablet Oral BID  . sodium chloride flush  3 mL Intravenous Q12H  . sodium chloride flush  3 mL Intravenous Q12H   Continuous Infusions: . sodium chloride    . cefTRIAXone (ROCEPHIN)  IV 2 g (07/03/16 0931)   PRN Meds: acetaminophen **OR** acetaminophen, alum & mag hydroxide-simeth, cyclobenzaprine, fentaNYL (SUBLIMAZE) injection, HYDROcodone-acetaminophen, ipratropium-albuterol, levalbuterol, menthol-cetylpyridinium **OR** phenol, metoprolol tartrate, ondansetron **OR** ondansetron (ZOFRAN) IV, sodium chloride flush, sodium chloride flush, white petrolatum, zolpidem   Vital Signs    Vitals:   07/02/16 1406 07/02/16 2114 07/03/16 0451 07/03/16 0628  BP: 134/88 (!) 116/58  134/89  Pulse: (!) 118 (!) 102  (!) 131  Resp: 18 18  18   Temp:  98.7 F (37.1 C)  98.8 F (37.1 C)  TempSrc:  Oral  Oral  SpO2: 97% 97%  97%  Weight:   84.8 kg (186 lb 14.4 oz)   Height:        Intake/Output Summary (Last 24 hours) at 07/03/16 0958 Last data filed at 07/03/16 0520  Gross per 24 hour  Intake              490 ml  Output             2400 ml  Net            -1910 ml   Filed Weights   07/02/16 0252 07/02/16 1108 07/03/16 0451  Weight: 85.7 kg (189 lb) 86.2 kg (190 lb)  84.8 kg (186 lb 14.4 oz)    Telemetry    Atrial flutter.  Rates 80s-130s - Personally Reviewed  ECG    n/a - Personally Reviewed  Physical Exam   GEN: Mildly ill-appearing.  No acute distress.   Neck: No JVD Cardiac: Tachycardic.  Irregularly irregular.  no murmurs, rubs, or gallops.  Respiratory: Clear to auscultation bilaterally.  No crackles, wheezes or rhonchi GI: Soft, nontender, non-distended  MS: 2+ pitting edema to the knee bilaterally; No deformity. Neuro:  Nonfocal  Psych: Normal affect   Labs    Chemistry Recent Labs Lab 07/01/16 0429 07/02/16 0353 07/03/16 0508  NA 132* 133* 133*  K 3.6 3.6 3.8  CL 95* 94* 93*  CO2 29 30 30   GLUCOSE 118* 100* 97  BUN 17 15 12   CREATININE 1.10* 1.07* 1.04*  CALCIUM 8.7* 8.7* 8.6*  ALBUMIN 1.7* 1.7* 1.8*  GFRNONAA 49* 51* 52*  GFRAA 57* 59* >60  ANIONGAP 8 9 10      Hematology Recent Labs Lab 06/30/16 0337 07/02/16 0848 07/03/16 0508  WBC 11.4* 13.8* 12.3*  RBC 3.40* 3.81* 3.56*  HGB 9.0* 10.3* 9.3*  HCT 28.4* 32.2* 29.9*  MCV 83.5 84.5 84.0  MCH 26.5 27.0 26.1  MCHC 31.7 32.0 31.1  RDW 17.0* 17.3* 16.8*  PLT 171 180 171    Cardiac Enzymes Recent Labs Lab 06/27/16 0937 06/27/16 1703 07/01/16 1633 07/01/16 2253  TROPONINI <0.03 0.37* <0.03 <0.03   No results for input(s): TROPIPOC in the last 168 hours.   BNPNo results for input(s): BNP, PROBNP in the last 168 hours.   DDimer No results for input(s): DDIMER in the last 168 hours.   Radiology    No results found.  Cardiac Studies   Echo 05/27/16: Study Conclusions  - Left ventricle: The cavity size was normal. Systolic function was   normal. The estimated ejection fraction was in the range of 55%   to 60%. Wall motion was normal; there were no regional wall   motion abnormalities. Doppler parameters are consistent with   abnormal left ventricular relaxation (grade 1 diastolic   dysfunction). Doppler parameters are consistent with  elevated   ventricular end-diastolic filling pressure. - Aortic valve: Trileaflet; mildly thickened, mildly calcified   leaflets. Transvalvular velocity was within the normal range.   There was no stenosis. Valve area (VTI): 2.03 cm^2. Valve area   (Vmax): 1.63 cm^2. Valve area (Vmean): 1.62 cm^2. - Aortic root: The aortic root was normal in size. - Mitral valve: There was mild regurgitation. - Right ventricle: The cavity size was normal. Wall thickness was   normal. Systolic function was normal. - Right atrium: The atrium was normal in size. - Tricuspid valve: There was mild regurgitation. - Pulmonary arteries: Systolic pressure was within the normal   range. - Inferior vena cava: The vessel was normal in size. - Pericardium, extracardiac: There was no pericardial effusion.   Patient Profile     71 y.o. female with CAD s/p CABG, hypertension, hyperlipidemia and OSA here with sepsis and epidural/parvertebral abscesses.  Hospitalization is complicated by acute on chronic diastolic heart failure and atrial fibrillation with RVR.   Assessment & Plan    # Atrial flutter: Erin [name] remains in atrial flutter and rates are poorly-controlled.  We will increase metoprolol to 50mg  q8h and diltiazem to 60mg  q6h.  Continue amiodarone.  I think that she needs a cardioversion.  She has received 2400 mg of amiodarone.  She is unwilling to make a decision unless I talk with her son. I spoke with her son, Erin [name], who is in agreement.  Will plan for TEE/DCCV tomorrow at noon with Dr. Eden Emms.  Eliquis was held for 3 days prior to her back surgery on 06/26/16.  [facility] has been requested to perform a transesophageal echocardiogram and direct current cardioversion on Erin [name] for atrial flutter.  After careful review of history and examination, the risks and benefits of transesophageal echocardiogram have been explained including risks of esophageal damage, perforation (1:10,000 risk), bleeding,  pharyngeal hematoma as well as other potential complications associated with conscious sedation including aspiration, arrhythmia, respiratory failure and death. Alternatives to treatment were discussed, questions were answered. Patient is willing to proceed.   # CAD s/p CABG:  # Demand ischemia: No chest pain.  This is thought to be demand from infection and rapid atrial flutter.  Continue aspirin, atorvastatin, and metoprolol.  Consider stopping aspirin given that she is on apixaban.  # Volume overload 2/2 fluid resuscitation +/- diastolic dysfunction: Continue diuresis with lasix 40mg  IV bid.  Renal function stable.  Will order Ted hose.  Signed, Chilton Si, MD  07/03/2016, 9:58 AM

## 2016-07-04 NOTE — Plan of Care (Signed)
Problem: Safety: Goal: Ability to remain free from injury will improve Outcome: Progressing No incidence of falls during this admission. Call bell within reach. Bed in low and locked position. Toileting offered Q2H. Patient alert and oriented. Clean and clear environment maintained. 3/4 siderails in place. Nonskid footwear being utilized. Patient verbalized understanding of safety instruction.  Problem: Pain Management: Goal: Pain level will decrease Outcome: Progressing Pain is being controlled with PO PRN pain medication. Vital signs are stable. Patient has been able to rest well.

## 2016-07-04 NOTE — CV Procedure (Signed)
TEE/DCC:  ON Rx Eliquis Propofol Anesthesia  Had LAA clip during CABG Thrombus seen behind clip With definity and color no flow through clip Since appendage isolated proceeded with [facility]  DCC x 1 120 Joules converted to NSR  No immediate neurologic sequelae. Had some blood Blisters on lips prior to procedure which bled during case But did not compromise airway  Charlton Haws
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
## 2016-07-04 NOTE — Progress Notes (Signed)
Called by nurse re: HR in 60s now that she is NSR - is on dilt 60mg  q6hr, metoprolol 50mg  TID - diltiazem due now. Per review with Dr. Duke Salvia will change dilitazem to CD 120mg  daily and decrease metoprolol to 50mg  BID. Cyerra Yim PA-C

## 2016-07-04 NOTE — Anesthesia Procedure Notes (Signed)
Procedure Name: MAC Date/Time: 07/04/2016 12:41 PM Performed by: Oletta Lamas Pre-anesthesia Checklist: Patient identified, Emergency Drugs available, Suction available and Patient being monitored Patient Re-evaluated:Patient Re-evaluated prior to inductionOxygen Delivery Method: Nasal cannula

## 2016-07-04 NOTE — Anesthesia Postprocedure Evaluation (Signed)
Anesthesia Post Note  Patient: Erin [name]  Procedure(s) Performed: Procedure(s) (LRB): TRANSESOPHAGEAL ECHOCARDIOGRAM (TEE) (N/A) CARDIOVERSION (N/A)  Patient location during evaluation: PACU Anesthesia Type: General Level of consciousness: awake and alert Pain management: pain level controlled Vital Signs Assessment: post-procedure vital signs reviewed and stable Respiratory status: spontaneous breathing, nonlabored ventilation, respiratory function stable and patient connected to nasal cannula oxygen Cardiovascular status: blood pressure returned to baseline and stable Postop Assessment: no signs of nausea or vomiting Anesthetic complications: no       Last Vitals:  Vitals:   07/04/16 1340 07/04/16 1400  BP: (!) 109/53 (!) 106/58  Pulse: 60 63  Resp: (!) 23 (!) 23  Temp:  36.7 C    Last Pain:  Vitals:   07/04/16 1319  TempSrc: Oral  PainSc:                  Kennieth Rad

## 2016-07-04 NOTE — Transfer of Care (Signed)
Immediate Anesthesia Transfer of Care Note  Patient: Erin [name]  Procedure(s) Performed: Procedure(s): TRANSESOPHAGEAL ECHOCARDIOGRAM (TEE) (N/A) CARDIOVERSION (N/A)  Patient Location: PACU and Endoscopy Unit  Anesthesia Type:General  Level of Consciousness: drowsy, patient cooperative and responds to stimulation  Airway & Oxygen Therapy: Patient Spontanous Breathing and Patient connected to nasal cannula oxygen  Post-op Assessment: Report given to RN and Post -op Vital signs reviewed and stable  Post vital signs: Reviewed and stable  Last Vitals:  Vitals:   07/04/16 1158 07/04/16 1209  BP: 126/72   Pulse:    Resp: (!) 22   Temp:  36.9 C    Last Pain:  Vitals:   07/04/16 1209  TempSrc: Oral  PainSc:       Patients Stated Pain Goal: 2 (07/04/16 1158)  Complications: No apparent anesthesia complications
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
## 2016-07-05 LAB — RENAL FUNCTION PANEL
Albumin: 1.8 g/dL — ABNORMAL LOW (ref 3.5–5.0)
Anion gap: 11 (ref 5–15)
BUN: 12 mg/dL (ref 6–20)
CO2: 31 mmol/L (ref 22–32)
Calcium: 8.1 mg/dL — ABNORMAL LOW (ref 8.9–10.3)
Chloride: 91 mmol/L — ABNORMAL LOW (ref 101–111)
Creatinine, Ser: 1.14 mg/dL — ABNORMAL HIGH (ref 0.44–1.00)
GFR calc Af Amer: 54 mL/min — ABNORMAL LOW (ref >60)
GFR calc non Af Amer: 47 mL/min — ABNORMAL LOW (ref >60)
Glucose, Bld: 113 mg/dL — ABNORMAL HIGH (ref 65–99)
Phosphorus: 3.4 mg/dL (ref 2.5–4.6)
Potassium: 3.4 mmol/L — ABNORMAL LOW (ref 3.5–5.1)
Sodium: 133 mmol/L — ABNORMAL LOW (ref 135–145)

## 2016-07-05 LAB — CBC
HCT: 25.8 % — ABNORMAL LOW (ref 36.0–46.0)
Hemoglobin: 8.1 g/dL — ABNORMAL LOW (ref 12.0–15.0)
MCH: 26.6 pg (ref 26.0–34.0)
MCHC: 31.4 g/dL (ref 30.0–36.0)
MCV: 84.6 fL (ref 78.0–100.0)
Platelets: 139 10*3/uL — ABNORMAL LOW (ref 150–400)
RBC: 3.05 MIL/uL — ABNORMAL LOW (ref 3.87–5.11)
RDW: 16.9 % — ABNORMAL HIGH (ref 11.5–15.5)
WBC: 12.9 10*3/uL — ABNORMAL HIGH (ref 4.0–10.5)

## 2016-07-05 MED ORDER — POTASSIUM CHLORIDE CRYS ER 20 MEQ PO TBCR
40.0000 meq | EXTENDED_RELEASE_TABLET | Freq: Two times a day (BID) | ORAL | Status: DC
  Administered 2016-07-05 – 2016-07-14 (×17): 40 meq via ORAL
  Administered 2016-07-14: 20 meq via ORAL
  Administered 2016-07-15 – 2016-07-19 (×8): 40 meq via ORAL
  Filled 2016-07-05 (×27): qty 2

## 2016-07-05 MED ORDER — FUROSEMIDE 10 MG/ML IJ SOLN
80.0000 mg | Freq: Two times a day (BID) | INTRAMUSCULAR | Status: DC
  Administered 2016-07-05 – 2016-07-07 (×4): 80 mg via INTRAVENOUS
  Filled 2016-07-05 (×4): qty 8

## 2016-07-05 MED ORDER — METOPROLOL SUCCINATE ER 100 MG PO TB24
100.0000 mg | ORAL_TABLET | Freq: Every day | ORAL | Status: DC
  Administered 2016-07-06 – 2016-07-09 (×4): 100 mg via ORAL
  Filled 2016-07-05 (×4): qty 1
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
## 2016-07-05 NOTE — Care Management Important Message (Signed)
Important Message  Patient Details  Name: Erin [name] MRN: [id number] Date of Birth: [birth year]   Medicare Important Message Given:  Yes    Manfred Laspina Abena 07/05/2016, 11:12 AM

## 2016-07-05 NOTE — Progress Notes (Signed)
Progress Note  Patient Name: Erin [name] Date of Encounter: 07/05/2016  Primary Cardiologist: Dr. Tresa Endo  Subjective   Feeling tired.  Palpitations improved.  Breathing is getting better.   Inpatient Medications    Scheduled Meds: . amiodarone  400 mg Oral BID  . apixaban  5 mg Oral BID  . aspirin EC  81 mg Oral Daily  . atorvastatin  80 mg Oral q1800  . cholecalciferol  2,000 Units Oral QPC breakfast  . cycloSPORINE  1 drop Both Eyes BID  . diltiazem  120 mg Oral Daily  . furosemide  40 mg Intravenous BID  . levothyroxine  112 mcg Oral QAC breakfast  . metoprolol tartrate  50 mg Oral BID  . multivitamin with minerals  1 tablet Oral Daily  . pantoprazole  40 mg Oral QHS  . polyethylene glycol  17 g Oral BID  . potassium chloride  20 mEq Oral BID  . senna-docusate  2 tablet Oral BID  . sodium chloride flush  3 mL Intravenous Q12H  . sodium chloride flush  3 mL Intravenous Q12H   Continuous Infusions: . sodium chloride    . cefTRIAXone (ROCEPHIN)  IV 2 g (07/05/16 0901)   PRN Meds: acetaminophen **OR** acetaminophen, alum & mag hydroxide-simeth, cyclobenzaprine, fentaNYL (SUBLIMAZE) injection, HYDROcodone-acetaminophen, ipratropium-albuterol, levalbuterol, menthol-cetylpyridinium **OR** phenol, metoprolol tartrate, ondansetron **OR** ondansetron (ZOFRAN) IV, sodium chloride flush, sodium chloride flush, white petrolatum, zolpidem   Vital Signs    Vitals:   07/04/16 1340 07/04/16 1400 07/04/16 2014 07/05/16 0514  BP: (!) 109/53 (!) 106/58 119/65 130/61  Pulse: 60 63 72 78  Resp: (!) 23 (!) 23 18 18   Temp:  98.1 F (36.7 C) 98.2 F (36.8 C) 98.3 F (36.8 C)  TempSrc:   Oral Oral  SpO2: 98% 96% 97% 94%  Weight:    84.6 kg (186 lb 6.4 oz)  Height:        Intake/Output Summary (Last 24 hours) at 07/05/16 1005 Last data filed at 07/05/16 0520  Gross per 24 hour  Intake              400 ml  Output              210 ml  Net              190 ml   Filed Weights     07/03/16 0451 07/04/16 0527 07/05/16 0514  Weight: 84.8 kg (186 lb 14.4 oz) 84.5 kg (186 lb 4.6 oz) 84.6 kg (186 lb 6.4 oz)    Telemetry    Sinus rhythm.  PVCs. - Personally Reviewed  ECG    n/a - Personally Reviewed  Physical Exam   GEN: Mildly ill-appearing.  No acute distress.   Neck: JVP 1cm above clavicle sitting upright.  Cardiac: RRR.  No murmurs, rubs, or gallops.  Respiratory: Bibasilar crackles.  No wheezes or rhonchi GI: Soft, nontender, non-distended  MS: 2+ pitting edema to the knee bilaterally; No deformity. Neuro:  Nonfocal  Psych: Normal affect   Labs    Chemistry  Recent Labs Lab 07/03/16 0508 07/04/16 0415 07/05/16 0433  NA 133* 131* 133*  K 3.8 3.5 3.4*  CL 93* 90* 91*  CO2 30 32 31  GLUCOSE 97 110* 113*  BUN 12 10 12   CREATININE 1.04* 1.06* 1.14*  CALCIUM 8.6* 8.5* 8.1*  ALBUMIN 1.8* 1.8* 1.8*  GFRNONAA 52* 51* 47*  GFRAA >60 59* 54*  ANIONGAP 10 9 11  Hematology  Recent Labs Lab 07/03/16 0508 07/04/16 0415 07/05/16 0433  WBC 12.3* 13.4* 12.9*  RBC 3.56* 3.28* 3.05*  HGB 9.3* 8.7* 8.1*  HCT 29.9* 27.8* 25.8*  MCV 84.0 84.8 84.6  MCH 26.1 26.5 26.6  MCHC 31.1 31.3 31.4  RDW 16.8* 17.0* 16.9*  PLT 171 145* 139*    Cardiac Enzymes  Recent Labs Lab 07/01/16 1633 07/01/16 2253  TROPONINI <0.03 <0.03   No results for input(s): TROPIPOC in the last 168 hours.   BNPNo results for input(s): BNP, PROBNP in the last 168 hours.   DDimer No results for input(s): DDIMER in the last 168 hours.   Radiology    No results found.  Cardiac Studies   Echo 05/27/16: Study Conclusions  - Left ventricle: The cavity size was normal. Systolic function was   normal. The estimated ejection fraction was in the range of 55%   to 60%. Wall motion was normal; there were no regional wall   motion abnormalities. Doppler parameters are consistent with   abnormal left ventricular relaxation (grade 1 diastolic   dysfunction). Doppler  parameters are consistent with elevated   ventricular end-diastolic filling pressure. - Aortic valve: Trileaflet; mildly thickened, mildly calcified   leaflets. Transvalvular velocity was within the normal range.   There was no stenosis. Valve area (VTI): 2.03 cm^2. Valve area   (Vmax): 1.63 cm^2. Valve area (Vmean): 1.62 cm^2. - Aortic root: The aortic root was normal in size. - Mitral valve: There was mild regurgitation. - Right ventricle: The cavity size was normal. Wall thickness was   normal. Systolic function was normal. - Right atrium: The atrium was normal in size. - Tricuspid valve: There was mild regurgitation. - Pulmonary arteries: Systolic pressure was within the normal   range. - Inferior vena cava: The vessel was normal in size. - Pericardium, extracardiac: There was no pericardial effusion.   Patient Profile     71 y.o. female with CAD s/p CABG, hypertension, hyperlipidemia and OSA here with sepsis and epidural/parvertebral abscesses.  Hospitalization is complicated by acute on chronic diastolic heart failure and atrial fibrillation with RVR.   Assessment & Plan    # Atrial flutter s/p LAA clipping: Ms. Manson Passey underwent TEE/DCCV yesterday.  She is maintaining sinus rhythm.  She was noted to have a LAA thrombus but it was isolated by the clip.  Continue Eliquis and amiodarone.  She will need 400mg  bid for two more days then 200 mg daily.  Follow thyroid function closely.  Continue metoprolol and diltiazem.  Will consolidate metoprolol to 100mg  daily tomorrow.  # CAD s/p CABG:  # Demand ischemia: No chest pain.  This is thought to be demand from infection and rapid atrial flutter.  Continue aspirin, atorvastatin, and metoprolol.  Consider stopping aspirin in 3 months given that she is on apixaban.   # Volume overload 2/2 fluid resuscitation +/- diastolic dysfunction: Continue diuresis with lasix 40mg  IV bid.  Renal function stable.  Encouraged to wear Ted  hose.    Signed, Chilton Si, MD  07/05/2016, 10:05 AM

## 2016-07-05 NOTE — Progress Notes (Signed)
      [address]       [place] [address]             [phone]      C/o blister on lip BP 130/61 (BP Location: Right Arm)   Pulse 78   Temp 98.3 F (36.8 C) (Oral)   Resp 18   Ht 5\' 3"  (1.6 m)   Wt 186 lb 6.4 oz (84.6 kg)   SpO2 94%   BMI 33.02 kg/m   Intake/Output Summary (Last 24 hours) at 07/05/16 0955 Last data filed at 07/05/16 0520  Gross per 24 hour  Intake              400 ml  Output              210 ml  Net              190 ml   Cardioverted yesterday, now in SR Sternal incision looks good  Viviann Spare C. Dorris Fetch, MD [facility] [phone]
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
## 2016-07-06 ENCOUNTER — Inpatient Hospital Stay (HOSPITAL_COMMUNITY): Payer: Medicare HMO

## 2016-07-06 DIAGNOSIS — G062 Extradural and subdural abscess, unspecified: Secondary | ICD-10-CM

## 2016-07-06 LAB — BASIC METABOLIC PANEL
Anion gap: 10 (ref 5–15)
BUN: 11 mg/dL (ref 6–20)
CO2: 30 mmol/L (ref 22–32)
Calcium: 8.2 mg/dL — ABNORMAL LOW (ref 8.9–10.3)
Chloride: 90 mmol/L — ABNORMAL LOW (ref 101–111)
Creatinine, Ser: 1.14 mg/dL — ABNORMAL HIGH (ref 0.44–1.00)
GFR calc Af Amer: 54 mL/min — ABNORMAL LOW (ref >60)
GFR calc non Af Amer: 47 mL/min — ABNORMAL LOW (ref >60)
Glucose, Bld: 117 mg/dL — ABNORMAL HIGH (ref 65–99)
Potassium: 3.8 mmol/L (ref 3.5–5.1)
Sodium: 130 mmol/L — ABNORMAL LOW (ref 135–145)

## 2016-07-06 LAB — RENAL FUNCTION PANEL
Albumin: 1.9 g/dL — ABNORMAL LOW (ref 3.5–5.0)
Anion gap: 10 (ref 5–15)
BUN: 11 mg/dL (ref 6–20)
CO2: 30 mmol/L (ref 22–32)
Calcium: 8.2 mg/dL — ABNORMAL LOW (ref 8.9–10.3)
Chloride: 91 mmol/L — ABNORMAL LOW (ref 101–111)
Creatinine, Ser: 1.13 mg/dL — ABNORMAL HIGH (ref 0.44–1.00)
GFR calc Af Amer: 55 mL/min — ABNORMAL LOW (ref >60)
GFR calc non Af Amer: 47 mL/min — ABNORMAL LOW (ref >60)
Glucose, Bld: 117 mg/dL — ABNORMAL HIGH (ref 65–99)
Phosphorus: 3 mg/dL (ref 2.5–4.6)
Potassium: 3.8 mmol/L (ref 3.5–5.1)
Sodium: 131 mmol/L — ABNORMAL LOW (ref 135–145)

## 2016-07-06 LAB — MAGNESIUM: Magnesium: 1.2 mg/dL — ABNORMAL LOW (ref 1.7–2.4)

## 2016-07-06 LAB — CBC
HCT: 27 % — ABNORMAL LOW (ref 36.0–46.0)
Hemoglobin: 8.3 g/dL — ABNORMAL LOW (ref 12.0–15.0)
MCH: 26 pg (ref 26.0–34.0)
MCHC: 30.7 g/dL (ref 30.0–36.0)
MCV: 84.6 fL (ref 78.0–100.0)
Platelets: 135 10*3/uL — ABNORMAL LOW (ref 150–400)
RBC: 3.19 MIL/uL — ABNORMAL LOW (ref 3.87–5.11)
RDW: 16.9 % — ABNORMAL HIGH (ref 11.5–15.5)
WBC: 11.3 10*3/uL — ABNORMAL HIGH (ref 4.0–10.5)

## 2016-07-06 MED ORDER — MAGNESIUM SULFATE 4 GM/100ML IV SOLN
4.0000 g | Freq: Once | INTRAVENOUS | Status: AC
  Administered 2016-07-06: 4 g via INTRAVENOUS
  Filled 2016-07-06: qty 100

## 2016-07-06 MED ORDER — POTASSIUM CHLORIDE ER 10 MEQ PO TBCR: 20.0000 meq | EXTENDED_RELEASE_TABLET | Freq: Every day | ORAL | Status: DC

## 2016-07-06 MED ORDER — GADOBENATE DIMEGLUMINE 529 MG/ML IV SOLN
17.0000 mL | Freq: Once | INTRAVENOUS | Status: AC | PRN
  Administered 2016-07-06: 17 mL via INTRAVENOUS

## 2016-07-06 MED ORDER — AMIODARONE HCL 200 MG PO TABS
200.0000 mg | ORAL_TABLET | Freq: Every day | ORAL | Status: DC
  Administered 2016-07-08 – 2016-07-25 (×17): 200 mg via ORAL
  Filled 2016-07-06 (×18): qty 1

## 2016-07-06 NOTE — Progress Notes (Signed)
Patient ID: Erin [name], female   DOB: [birth year], 71 y.o.   MRN: [id number] I was asked to see this patient because of continued progressive back pain through the week. She describes some heaviness in the legs. She has more trouble getting up because of pain today. The pain radiates to the back around to the groin. The pain is quite severe and limits her mobility. She remains on Rocephin. She has good dorsi and plantar flexion but seems somewhat weak proximally. She has probably 3 out of 5 as best I can tell to an in chair exam but I don't how much of this is related to pain. She seems to have sensation.. I have recommended an MRI of the lumbar spine with and without contrast.

## 2016-07-06 NOTE — Progress Notes (Signed)
PT Cancellation Note  Patient Details Name: Erin [name] MRN: [id number] DOB: 11/22/1945   Cancelled Treatment:     Pt deferring PT visit at this time due to pain 9/10.  States she just had pain medication but requesting to have therapy at later time today if possible.  Will have another PT attempt to check on patient later today if time allows.    Verdell Face, PTA  07/06/2016
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
## 2016-07-06 NOTE — Progress Notes (Signed)
      [address]       [place] [address]             [phone]      Patient asleep at present. Talked to her son. She did not sleep last night due to back pain  BP (!) 119/59 (BP Location: Right Arm)   Pulse 76   Temp 98.2 F (36.8 C) (Oral)   Resp 20   Ht 5\' 3"  (1.6 m)   Wt 186 lb 14.4 oz (84.8 kg)   SpO2 91%   BMI 33.11 kg/m    Intake/Output Summary (Last 24 hours) at 07/06/16 1103 Last data filed at 07/06/16 0215  Gross per 24 hour  Intake              240 ml  Output              800 ml  Net             -560 ml   Maintaining SR  On diuretics but weight unchanged  Consider reimaging back if pain persists  Viviann Spare C. Dorris Fetch, MD [facility] [phone]

## 2016-07-06 NOTE — Progress Notes (Signed)
Progress Note  Patient Name: Erin [name] Date of Encounter: 07/06/2016  Primary Cardiologist: Dr. Tresa Endo  Subjective   Feeling tired and naueous.  Ambulated the hall without chest pain or shortness of breath.    Inpatient Medications    Scheduled Meds: . amiodarone  400 mg Oral BID  . apixaban  5 mg Oral BID  . aspirin EC  81 mg Oral Daily  . atorvastatin  80 mg Oral q1800  . cholecalciferol  2,000 Units Oral QPC breakfast  . cycloSPORINE  1 drop Both Eyes BID  . diltiazem  120 mg Oral Daily  . furosemide  80 mg Intravenous BID  . levothyroxine  112 mcg Oral QAC breakfast  . metoprolol succinate  100 mg Oral Daily  . multivitamin with minerals  1 tablet Oral Daily  . pantoprazole  40 mg Oral QHS  . polyethylene glycol  17 g Oral BID  . potassium chloride  40 mEq Oral BID  . senna-docusate  2 tablet Oral BID  . sodium chloride flush  3 mL Intravenous Q12H  . sodium chloride flush  3 mL Intravenous Q12H   Continuous Infusions: . sodium chloride    . cefTRIAXone (ROCEPHIN)  IV Stopped (07/05/16 2153)   PRN Meds: acetaminophen **OR** acetaminophen, alum & mag hydroxide-simeth, cyclobenzaprine, fentaNYL (SUBLIMAZE) injection, HYDROcodone-acetaminophen, ipratropium-albuterol, levalbuterol, menthol-cetylpyridinium **OR** phenol, metoprolol tartrate, ondansetron **OR** ondansetron (ZOFRAN) IV, sodium chloride flush, sodium chloride flush, white petrolatum, zolpidem   Vital Signs    Vitals:   07/05/16 0514 07/05/16 1244 07/05/16 2033 07/06/16 0422  BP: 130/61 115/60 (!) 107/51 (!) 119/59  Pulse: 78 60 71 76  Resp: 18 17 18 20   Temp: 98.3 F (36.8 C) 98.2 F (36.8 C) 97.6 F (36.4 C) 98.2 F (36.8 C)  TempSrc: Oral Oral Oral Oral  SpO2: 94% 92% 91% 91%  Weight: 84.6 kg (186 lb 6.4 oz)   84.8 kg (186 lb 14.4 oz)  Height:        Intake/Output Summary (Last 24 hours) at 07/06/16 1046 Last data filed at 07/06/16 0215  Gross per 24 hour  Intake              240 ml    Output              800 ml  Net             -560 ml   Filed Weights   07/04/16 0527 07/05/16 0514 07/06/16 0422  Weight: 84.5 kg (186 lb 4.6 oz) 84.6 kg (186 lb 6.4 oz) 84.8 kg (186 lb 14.4 oz)    Telemetry    Sinus rhythm.  PVCs. - Personally Reviewed  ECG    n/a - Personally Reviewed  Physical Exam   GEN: Mildly ill-appearing.  No acute distress.   Neck: JVP cm above clavicle laying flat Cardiac: RRR.  No murmurs, rubs, or gallops.  Respiratory: Faint bibasilar crackles.  No wheezes or rhonchi GI: Soft, nontender, non-distended  MS: 1+ pitting edema to the mid tibia bilaterally; No deformity. Neuro:  Nonfocal  Psych: Normal affect   Labs    Chemistry  Recent Labs Lab 07/04/16 0415 07/05/16 0433 07/06/16 0432  NA 131* 133* 130*  131*  K 3.5 3.4* 3.8  3.8  CL 90* 91* 90*  91*  CO2 32 31 30  30   GLUCOSE 110* 113* 117*  117*  BUN 10 12 11  11   CREATININE 1.06* 1.14* 1.14*  1.13*  CALCIUM 8.5*  8.1* 8.2*  8.2*  ALBUMIN 1.8* 1.8* 1.9*  GFRNONAA 51* 47* 47*  47*  GFRAA 59* 54* 54*  55*  ANIONGAP 9 11 10  10      Hematology  Recent Labs Lab 07/04/16 0415 07/05/16 0433 07/06/16 0432  WBC 13.4* 12.9* 11.3*  RBC 3.28* 3.05* 3.19*  HGB 8.7* 8.1* 8.3*  HCT 27.8* 25.8* 27.0*  MCV 84.8 84.6 84.6  MCH 26.5 26.6 26.0  MCHC 31.3 31.4 30.7  RDW 17.0* 16.9* 16.9*  PLT 145* 139* 135*    Cardiac Enzymes  Recent Labs Lab 07/01/16 1633 07/01/16 2253  TROPONINI <0.03 <0.03   No results for input(s): TROPIPOC in the last 168 hours.   BNPNo results for input(s): BNP, PROBNP in the last 168 hours.   DDimer No results for input(s): DDIMER in the last 168 hours.   Radiology    No results found.  Cardiac Studies   Echo 05/27/16: Study Conclusions  - Left ventricle: The cavity size was normal. Systolic function was   normal. The estimated ejection fraction was in the range of 55%   to 60%. Wall motion was normal; there were no regional wall    motion abnormalities. Doppler parameters are consistent with   abnormal left ventricular relaxation (grade 1 diastolic   dysfunction). Doppler parameters are consistent with elevated   ventricular end-diastolic filling pressure. - Aortic valve: Trileaflet; mildly thickened, mildly calcified   leaflets. Transvalvular velocity was within the normal range.   There was no stenosis. Valve area (VTI): 2.03 cm^2. Valve area   (Vmax): 1.63 cm^2. Valve area (Vmean): 1.62 cm^2. - Aortic root: The aortic root was normal in size. - Mitral valve: There was mild regurgitation. - Right ventricle: The cavity size was normal. Wall thickness was   normal. Systolic function was normal. - Right atrium: The atrium was normal in size. - Tricuspid valve: There was mild regurgitation. - Pulmonary arteries: Systolic pressure was within the normal   range. - Inferior vena cava: The vessel was normal in size. - Pericardium, extracardiac: There was no pericardial effusion.   Patient Profile     71 y.o. female with CAD s/p CABG, hypertension, hyperlipidemia and OSA here with sepsis and epidural/parvertebral abscesses.  Hospitalization is complicated by acute on chronic diastolic heart failure and atrial fibrillation with RVR.   Assessment & Plan    # Atrial flutter s/p LAA clipping: Erin [name] underwent TEE/DCCV and remains in sinus rhythm. She is maintaining sinus rhythm.  She was noted to have a LAA thrombus but it was isolated by the clip.  Continue Eliquis and amiodarone.  Switch to amiodarone 200mg  daily on Sunday.  Continue metoprolol and diltiazem. Continue Eliquis.    # CAD s/p CABG:  # Demand ischemia: No chest pain.  This is thought to be demand from infection and rapid atrial flutter.  Continue aspirin, atorvastatin, and metoprolol.  Consider stopping aspirin in 3 months given that she is on apixaban.   # Volume overload 2/2 fluid resuscitation +/- diastolic dysfunction:  Lasix was increased to 80mg  IV  bid.  Weight is not showing improvement but her legs look much better.  Renal function stable.  Continue diuresis.  She was unable to tolerate Ted hose.   # AKI: Creatinine stable ad BUN dropping.  However sodium remains low with diuresis.  Suspect hypervolemic hyponatremia.    Signed, Chilton Si, MD  07/06/2016, 10:46 AM

## 2016-07-07 DIAGNOSIS — I480 Paroxysmal atrial fibrillation: Secondary | ICD-10-CM

## 2016-07-07 LAB — PROTIME-INR
INR: 2.25
Prothrombin Time: 25.2 seconds — ABNORMAL HIGH (ref 11.4–15.2)

## 2016-07-07 LAB — CBC
HCT: 24.9 % — ABNORMAL LOW (ref 36.0–46.0)
Hemoglobin: 8.2 g/dL — ABNORMAL LOW (ref 12.0–15.0)
MCH: 27.6 pg (ref 26.0–34.0)
MCHC: 32.9 g/dL (ref 30.0–36.0)
MCV: 83.8 fL (ref 78.0–100.0)
Platelets: 135 10*3/uL — ABNORMAL LOW (ref 150–400)
RBC: 2.97 MIL/uL — ABNORMAL LOW (ref 3.87–5.11)
RDW: 17.5 % — ABNORMAL HIGH (ref 11.5–15.5)
WBC: 10.8 10*3/uL — ABNORMAL HIGH (ref 4.0–10.5)

## 2016-07-07 LAB — RENAL FUNCTION PANEL
Albumin: 1.8 g/dL — ABNORMAL LOW (ref 3.5–5.0)
Anion gap: 10 (ref 5–15)
BUN: 11 mg/dL (ref 6–20)
CO2: 31 mmol/L (ref 22–32)
Calcium: 8.2 mg/dL — ABNORMAL LOW (ref 8.9–10.3)
Chloride: 91 mmol/L — ABNORMAL LOW (ref 101–111)
Creatinine, Ser: 1.12 mg/dL — ABNORMAL HIGH (ref 0.44–1.00)
GFR calc Af Amer: 55 mL/min — ABNORMAL LOW (ref >60)
GFR calc non Af Amer: 48 mL/min — ABNORMAL LOW (ref >60)
Glucose, Bld: 162 mg/dL — ABNORMAL HIGH (ref 65–99)
Phosphorus: 2.8 mg/dL (ref 2.5–4.6)
Potassium: 4.1 mmol/L (ref 3.5–5.1)
Sodium: 132 mmol/L — ABNORMAL LOW (ref 135–145)

## 2016-07-07 LAB — BASIC METABOLIC PANEL
Anion gap: 11 (ref 5–15)
BUN: 11 mg/dL (ref 6–20)
CO2: 28 mmol/L (ref 22–32)
Calcium: 8.2 mg/dL — ABNORMAL LOW (ref 8.9–10.3)
Chloride: 91 mmol/L — ABNORMAL LOW (ref 101–111)
Creatinine, Ser: 1.14 mg/dL — ABNORMAL HIGH (ref 0.44–1.00)
GFR calc Af Amer: 54 mL/min — ABNORMAL LOW (ref >60)
GFR calc non Af Amer: 47 mL/min — ABNORMAL LOW (ref >60)
Glucose, Bld: 160 mg/dL — ABNORMAL HIGH (ref 65–99)
Potassium: 4.1 mmol/L (ref 3.5–5.1)
Sodium: 130 mmol/L — ABNORMAL LOW (ref 135–145)

## 2016-07-07 LAB — SEDIMENTATION RATE: Sed Rate: 121 mm/hr — ABNORMAL HIGH (ref 0–22)

## 2016-07-07 LAB — C-REACTIVE PROTEIN: CRP: 18.8 mg/dL — ABNORMAL HIGH (ref <1.0)

## 2016-07-07 MED ORDER — FUROSEMIDE 10 MG/ML IJ SOLN
120.0000 mg | Freq: Two times a day (BID) | INTRAVENOUS | Status: DC
  Administered 2016-07-07 – 2016-07-10 (×6): 120 mg via INTRAVENOUS
  Filled 2016-07-07 (×2): qty 12
  Filled 2016-07-07: qty 10
  Filled 2016-07-07 (×4): qty 12

## 2016-07-07 MED ORDER — OXYCODONE HCL 5 MG PO TABS
5.0000 mg | ORAL_TABLET | ORAL | Status: DC | PRN
  Administered 2016-07-07 – 2016-07-10 (×14): 10 mg via ORAL
  Administered 2016-07-11 – 2016-07-12 (×2): 5 mg via ORAL
  Administered 2016-07-12: 10 mg via ORAL
  Filled 2016-07-07 (×4): qty 2
  Filled 2016-07-07: qty 1
  Filled 2016-07-07 (×6): qty 2
  Filled 2016-07-07: qty 1
  Filled 2016-07-07 (×8): qty 2

## 2016-07-07 MED ORDER — HYDROCODONE-ACETAMINOPHEN 10-325 MG PO TABS: 1.0000 | ORAL_TABLET | Freq: Four times a day (QID) | ORAL | Status: DC | PRN

## 2016-07-07 MED ORDER — METOLAZONE 5 MG PO TABS
5.0000 mg | ORAL_TABLET | Freq: Once | ORAL | Status: AC
  Administered 2016-07-07: 5 mg via ORAL
  Filled 2016-07-07: qty 1

## 2016-07-07 MED ORDER — CEFAZOLIN SODIUM-DEXTROSE 2-4 GM/100ML-% IV SOLN
2.0000 g | Freq: Three times a day (TID) | INTRAVENOUS | Status: DC
  Administered 2016-07-07 – 2016-08-01 (×71): 2 g via INTRAVENOUS
  Filled 2016-07-07 (×78): qty 100

## 2016-07-07 MED ORDER — MUPIROCIN 2 % EX OINT
TOPICAL_OINTMENT | Freq: Two times a day (BID) | CUTANEOUS | Status: DC
  Administered 2016-07-07 – 2016-07-09 (×6): via TOPICAL
  Administered 2016-07-10: 1 via TOPICAL
  Administered 2016-07-10 – 2016-07-15 (×9): via TOPICAL
  Administered 2016-07-15: 1 via TOPICAL
  Administered 2016-07-16 (×2): via TOPICAL
  Administered 2016-07-17: 1 via TOPICAL
  Administered 2016-07-17 – 2016-07-25 (×14): via TOPICAL
  Administered 2016-07-26: 1 via TOPICAL
  Administered 2016-07-26 – 2016-07-29 (×5): via TOPICAL
  Administered 2016-07-29: 1 via TOPICAL
  Administered 2016-07-31 (×2): via TOPICAL
  Filled 2016-07-07 (×7): qty 22

## 2016-07-07 NOTE — Progress Notes (Addendum)
MRI reviewed and agree with Dr. Ronnald Ramp, not unexpected findings and continuing with medical therapy.  ESR today but I do not feel change of antibiotics indicated at this time with known organism.    ADDENDUM: ESR remains elevated, will change to cefazolin and extend stop date 2 weeks.   Scharlene Gloss, MD

## 2016-07-07 NOTE — Progress Notes (Signed)
Patient ID: Erin [name], female   DOB: April 03, 1945, 71 y.o.   MRN: [id number] I spoke with pt about her MRI, back pain, no leg pain today, OOB to chair. I do not see anything that needs acute surgical intervention. No severe canal compromise. As I expected, MRI shows diskitis at L3-4 with septic arthritis, which is treated medically at this stage. Bracing may help when up, will need pain control for quite a while, Rec ID follow up. Continue ABX.
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
## 2016-07-07 NOTE — Progress Notes (Signed)
ANTIBIOTIC CONSULT NOTE   Pharmacy Consult for Rocephin Indication: UTI/ E.coli bacteremia/spinal abscess  Allergies  Allergen Reactions  . Ace Inhibitors Swelling    ACE stopped after pt seen in ED with facial swelling- allergy testing pending  . Morphine And Related   . Codeine Nausea And Vomiting    Patient Measurements: Height: '5\' 3"'  (160 cm) Weight: 190 lb 14.7 oz (86.6 kg) IBW/kg (Calculated) : 52.4  Vital Signs: Temp: 97.5 F (36.4 C) (05/19 0600) Temp Source: Oral (05/19 0600) BP: 116/64 (05/19 0600) Pulse Rate: 67 (05/19 0600) Intake/Output from previous day: 05/18 0701 - 05/19 0700 In: 130 [P.O.:120; I.V.:10] Out: 750 [Urine:750] Intake/Output from this shift: Total I/O In: 50 [IV Piggyback:50] Out: 300 [Urine:300]  Labs:  Recent Labs  07/05/16 0433 07/06/16 0432 07/07/16 0330  WBC 12.9* 11.3* 10.8*  HGB 8.1* 8.3* 8.2*  PLT 139* 135* 135*  CREATININE 1.14* 1.14*  1.13* 1.14*  1.12*   Estimated Creatinine Clearance: 47.4 mL/min (A) (by C-G formula based on SCr of 1.12 mg/dL (H)). No results for input(s): VANCOTROUGH, VANCOPEAK, VANCORANDOM, GENTTROUGH, GENTPEAK, GENTRANDOM, TOBRATROUGH, TOBRAPEAK, TOBRARND, AMIKACINPEAK, AMIKACINTROU, AMIKACIN in the last 72 hours.   Microbiology:   Medical History: Past Medical History:  Diagnosis Date  . Arthritis    "all my back is eat up w/it; knees too" (05/24/2016)  . CAD (coronary artery disease)    s/p CABG  . Chronic bronchitis ([place])   . Chronic lower back pain   . Dyspnea    "since OR 04/2016" (05/24/2016)  . Family history of adverse reaction to anesthesia    "daughter gets PONV" (05/24/2016)  . GERD (gastroesophageal reflux disease)    occ  . High cholesterol   . History of stomach ulcers   . Hypertension   . Hypothyroidism   . Migraine    "usually have one monthly; nothing since 04/25/2016)  . Pneumonia ~ 2002   Assessment:  ID: 71 yo female with E.coli bacteremia/spinal abscess on rocephin.   ESR= 121 and CRP= 18.8.  Pharmacy consulted to change to ancef  Vanc 5/3 >>5/7, 5/8>>5/9 Zosyn 5/3 >> 5/7 CTX 5/7 >> 5/19 Ancef 5/19>>  5/8 wound A: few Ecoli S Rocephin 5/8 wound B: Ecoli 5/8: wound C: Ecoli 5/8 wound/tissue: ngtd-reincubate 5/6 BCID: EColi 5/6 Blood- 1/2 EColi 5/3 Blood- E coli pan sensitive  5/3 BCID - E coli 5/3 urine - E.coli pan sensitive  5/3 Influenza PCR negative  Plan:  -Ancef 2gm IV q8h -Will sign off. Please contact pharmacy with any other needs.  Thank you, Hildred Laser, Pharm D 07/07/2016 2:12 PM

## 2016-07-07 NOTE — Progress Notes (Signed)
Progress Note  Patient Name: Erin [name] Date of Encounter: 07/07/2016  Primary Cardiologist: Tresa Endo  Subjective   Feeling better, since back pain is well controlled right now. She has dyspnea, but this is relatively mild. Does speak in interrupted sentences. Reported negative fluid balance over the last 24 hours, but weight has increased 4 pounds. Markedly hypervolemic on exam. Maintaining normal sinus rhythm for over 48 hours now.  Inpatient Medications    Scheduled Meds: . [START ON 07/08/2016] amiodarone  200 mg Oral Daily  . amiodarone  400 mg Oral BID  . apixaban  5 mg Oral BID  . aspirin EC  81 mg Oral Daily  . atorvastatin  80 mg Oral q1800  . cholecalciferol  2,000 Units Oral QPC breakfast  . cycloSPORINE  1 drop Both Eyes BID  . diltiazem  120 mg Oral Daily  . furosemide  80 mg Intravenous BID  . levothyroxine  112 mcg Oral QAC breakfast  . metoprolol succinate  100 mg Oral Daily  . multivitamin with minerals  1 tablet Oral Daily  . mupirocin ointment   Topical BID  . pantoprazole  40 mg Oral QHS  . polyethylene glycol  17 g Oral BID  . potassium chloride  40 mEq Oral BID  . senna-docusate  2 tablet Oral BID  . sodium chloride flush  3 mL Intravenous Q12H  . sodium chloride flush  3 mL Intravenous Q12H   Continuous Infusions: . sodium chloride    . cefTRIAXone (ROCEPHIN)  IV Stopped (07/07/16 1118)   PRN Meds: acetaminophen **OR** acetaminophen, alum & mag hydroxide-simeth, cyclobenzaprine, fentaNYL (SUBLIMAZE) injection, ipratropium-albuterol, levalbuterol, menthol-cetylpyridinium **OR** phenol, metoprolol tartrate, ondansetron **OR** ondansetron (ZOFRAN) IV, oxyCODONE, sodium chloride flush, sodium chloride flush, white petrolatum, zolpidem   Vital Signs    Vitals:   07/06/16 2145 07/06/16 2243 07/07/16 0256 07/07/16 0600  BP: 123/69   116/64  Pulse: 73   67  Resp: (!) 22   20  Temp: 98.2 F (36.8 C) 98.6 F (37 C)  97.5 F (36.4 C)  TempSrc: Oral  Oral  Oral  SpO2: 93%   100%  Weight:   190 lb 14.7 oz (86.6 kg)   Height:        Intake/Output Summary (Last 24 hours) at 07/07/16 1311 Last data filed at 07/07/16 1046  Gross per 24 hour  Intake              180 ml  Output             1050 ml  Net             -870 ml   Filed Weights   07/05/16 0514 07/06/16 0422 07/07/16 0256  Weight: 186 lb 6.4 oz (84.6 kg) 186 lb 14.4 oz (84.8 kg) 190 lb 14.7 oz (86.6 kg)    Telemetry    Normal sinus rhythm with occasional PVCs and occasional PACs numerous artifactual alarms for "pause" and "asystole" due to low voltage QRS- Personally Reviewed  ECG    Atrial fibrillation with controlled rate, low voltage QRS - Personally Reviewed  Physical Exam  Sitting up eating lunch, appears comfortable but speaks in interrupted sentences GEN: No acute distress.   Neck:  10-11 centimeters JVD Cardiac: RRR, no murmurs, rubs, or gallops.  Respiratory: Clear to auscultation bilaterally. GI: Soft, nontender, non-distended  MS:  3+ pitting edema to the thighs bilaterally; No deformity. Neuro:  Nonfocal  Psych: Normal affect   Labs  Chemistry Recent Labs Lab 07/05/16 0433 07/06/16 0432 07/07/16 0330  NA 133* 130*  131* 130*  132*  K 3.4* 3.8  3.8 4.1  4.1  CL 91* 90*  91* 91*  91*  CO2 31 30  30 28  31   GLUCOSE 113* 117*  117* 160*  162*  BUN 12 11  11 11  11   CREATININE 1.14* 1.14*  1.13* 1.14*  1.12*  CALCIUM 8.1* 8.2*  8.2* 8.2*  8.2*  ALBUMIN 1.8* 1.9* 1.8*  GFRNONAA 47* 47*  47* 47*  48*  GFRAA 54* 54*  55* 54*  55*  ANIONGAP 11 10  10 11  10      Hematology Recent Labs Lab 07/05/16 0433 07/06/16 0432 07/07/16 0330  WBC 12.9* 11.3* 10.8*  RBC 3.05* 3.19* 2.97*  HGB 8.1* 8.3* 8.2*  HCT 25.8* 27.0* 24.9*  MCV 84.6 84.6 83.8  MCH 26.6 26.0 27.6  MCHC 31.4 30.7 32.9  RDW 16.9* 16.9* 17.5*  PLT 139* 135* 135*    Cardiac Enzymes Recent Labs Lab 07/01/16 1633 07/01/16 2253  TROPONINI <0.03 <0.03     No results for input(s): TROPIPOC in the last 168 hours.   BNPNo results for input(s): BNP, PROBNP in the last 168 hours.   DDimer No results for input(s): DDIMER in the last 168 hours.   Radiology    Mr Lumbar Spine W Wo Contrast  Result Date: 07/06/2016 CLINICAL DATA:  Worsening low back pain after incision and drainage of lumbar spine epidural abscess May 8th. EXAM: MRI LUMBAR SPINE WITHOUT AND WITH CONTRAST TECHNIQUE: Multiplanar and multiecho pulse sequences of the lumbar spine were obtained without and with intravenous contrast. CONTRAST:  17mL MULTIHANCE GADOBENATE DIMEGLUMINE 529 MG/ML IV SOLN COMPARISON:  MRI of lumbar spine with contrast May 7th 2018 and MRI of the lumbar spine without contrast Jun 21, 2016 FINDINGS: SEGMENTATION: For the purposes of this report, the last well-formed intervertebral disc will be described as L5-S1. ALIGNMENT: Maintenance of the lumbar lordosis. No malalignment. VERTEBRAE:New mild widening L3-4 disc space with RIGHT T2 signal within the disc, and mild marginal enhancement. Remaining discs demonstrate normal morphology with slight desiccation. Multilevel mild chronic discogenic endplate changes and scattered old Schmorl's nodes. Scattered hemangiomata. Status post L4-5 and L5-S1 PLIF, hardware results in susceptibility artifact. CONUS MEDULLARIS: Conus medullaris terminates at T12-L1 and demonstrates normal morphology and signal characteristics. Fuzzy appearance of the cauda equina without enhancement equivocal for arachnoiditis, similar. PARASPINAL AND SOFT TISSUES: Similar epidural enhancement from L2 through the level of surgical intervention, with focal 15 x 6 mm residual ventral epidural abscess at L3. Resolution of fluid collections surrounding the pedicle screws. Enlarging 3.1 x 1.1 cm LEFT paraspinal fluid collection along the surgical approach, now tracking to the surgical bed. The previous 3.8 x 2 cm fluid collection within the surgical bed is now 2.5 x  2 cm with thick irregular marginal enhancement. Mild symmetric iliopsoas muscle denervation versus myositis. Paraspinal muscle denervation versus myositis. Ectatic infrarenal aorta. DISC LEVELS: L1-2: Small broad-based disc bulge. Mild facet arthropathy and ligamentum flavum redundancy without canal stenosis. Mild neural foraminal narrowing. Small broad-based disc bulge. Mild to moderate facet arthropathy and ligamentum flavum redundancy without canal stenosis. Mild bilateral neural foraminal narrowing. L3-4: No disc bulge. Progressively widened facets to 4 mm, limited assessment for enhancement due to hardware artifact. Mild canal stenosis. Mild to moderate bilateral neural foraminal narrowing. L4-5: Posterior decompression. Mild thecal sac effacement due to epidural abscess. Posterior decompression. Phlegmon/abscess extending to  the neural foramen bilaterally. L5-S1: PLIF. No canal stenosis. Phlegmon effacing the medial neural foramen bilaterally. IMPRESSION: New L3-4 discitis, with bilateral L3-4 facet effusions concerning for septic arthropathy. Similar epidural abscess. Consolidating abscess within the L4-5 surgical bed tracking into the paraspinal soft tissues. Surrounding paraspinal and iliopsoas myositis versus denervation. Mild canal stenosis L3-4. Phlegmon effacing the medial neural foramen at L4-5 and L5-S1. Electronically Signed   By: Awilda Metro M.D.   On: 07/06/2016 21:40    Patient Profile     71 y.o. female with coronary artery disease and recent bypass surgery, recurrent paroxysmal atrial fibrillation, acute diastolic heart failure, L3-L4 discitis, UTI and bacteremia with Escherichia coli.  Assessment & Plan    1. AFib:Taking sinus rhythm on amiodarone. Plan to reduce to 200 mg daily on Sunday. Anticoagulated with Eliquis. She has had clipping of her left atrial appendage. Stop aspirin after 3 months post bypass. 2. CHF: Markedly volume overloaded. Will increase diuretics and add  metolazone. Recheck daily potassium, magnesium, renal function labs. 3. CAD s/p recent CABG: No cardiomegaly or chest x-ray. Consider repeat echo for pericardial effusion if she develops hypotension with diuresis.  Signed, Thurmon Fair, MD  07/07/2016, 1:11 PM

## 2016-07-08 LAB — CBC
HCT: 25.6 % — ABNORMAL LOW (ref 36.0–46.0)
Hemoglobin: 7.8 g/dL — ABNORMAL LOW (ref 12.0–15.0)
MCH: 26.3 pg (ref 26.0–34.0)
MCHC: 30.5 g/dL (ref 30.0–36.0)
MCV: 86.2 fL (ref 78.0–100.0)
Platelets: 167 10*3/uL (ref 150–400)
RBC: 2.97 MIL/uL — ABNORMAL LOW (ref 3.87–5.11)
RDW: 17.3 % — ABNORMAL HIGH (ref 11.5–15.5)
WBC: 9.4 10*3/uL (ref 4.0–10.5)

## 2016-07-08 LAB — BASIC METABOLIC PANEL
Anion gap: 8 (ref 5–15)
BUN: 11 mg/dL (ref 6–20)
CO2: 34 mmol/L — ABNORMAL HIGH (ref 22–32)
Calcium: 8.5 mg/dL — ABNORMAL LOW (ref 8.9–10.3)
Chloride: 90 mmol/L — ABNORMAL LOW (ref 101–111)
Creatinine, Ser: 1.18 mg/dL — ABNORMAL HIGH (ref 0.44–1.00)
GFR calc Af Amer: 52 mL/min — ABNORMAL LOW (ref >60)
GFR calc non Af Amer: 45 mL/min — ABNORMAL LOW (ref >60)
Glucose, Bld: 105 mg/dL — ABNORMAL HIGH (ref 65–99)
Potassium: 4.6 mmol/L (ref 3.5–5.1)
Sodium: 132 mmol/L — ABNORMAL LOW (ref 135–145)

## 2016-07-08 LAB — RENAL FUNCTION PANEL
Albumin: 1.8 g/dL — ABNORMAL LOW (ref 3.5–5.0)
Anion gap: 7 (ref 5–15)
BUN: 10 mg/dL (ref 6–20)
CO2: 34 mmol/L — ABNORMAL HIGH (ref 22–32)
Calcium: 8.4 mg/dL — ABNORMAL LOW (ref 8.9–10.3)
Chloride: 90 mmol/L — ABNORMAL LOW (ref 101–111)
Creatinine, Ser: 1.12 mg/dL — ABNORMAL HIGH (ref 0.44–1.00)
GFR calc Af Amer: 55 mL/min — ABNORMAL LOW (ref >60)
GFR calc non Af Amer: 48 mL/min — ABNORMAL LOW (ref >60)
Glucose, Bld: 102 mg/dL — ABNORMAL HIGH (ref 65–99)
Phosphorus: 3.3 mg/dL (ref 2.5–4.6)
Potassium: 4.5 mmol/L (ref 3.5–5.1)
Sodium: 131 mmol/L — ABNORMAL LOW (ref 135–145)

## 2016-07-08 LAB — MAGNESIUM: Magnesium: 1.5 mg/dL — ABNORMAL LOW (ref 1.7–2.4)

## 2016-07-08 MED ORDER — MAGNESIUM SULFATE 2 GM/50ML IV SOLN
2.0000 g | Freq: Once | INTRAVENOUS | Status: AC
  Administered 2016-07-08: 2 g via INTRAVENOUS
  Filled 2016-07-08: qty 50

## 2016-07-08 MED ORDER — METOLAZONE 5 MG PO TABS
5.0000 mg | ORAL_TABLET | Freq: Once | ORAL | Status: AC
  Administered 2016-07-08: 5 mg via ORAL
  Filled 2016-07-08: qty 1

## 2016-07-08 NOTE — Progress Notes (Signed)
Progress Note  Patient Name: Erin [name] Date of Encounter: 07/08/2016  Primary Cardiologist: Dr. Daphene Jaeger  Subjective   Still with dyspnea. Back pain.   Inpatient Medications    Scheduled Meds: . amiodarone  200 mg Oral Daily  . apixaban  5 mg Oral BID  . aspirin EC  81 mg Oral Daily  . atorvastatin  80 mg Oral q1800  . cholecalciferol  2,000 Units Oral QPC breakfast  . cycloSPORINE  1 drop Both Eyes BID  . diltiazem  120 mg Oral Daily  . levothyroxine  112 mcg Oral QAC breakfast  . metoprolol succinate  100 mg Oral Daily  . multivitamin with minerals  1 tablet Oral Daily  . mupirocin ointment   Topical BID  . pantoprazole  40 mg Oral QHS  . polyethylene glycol  17 g Oral BID  . potassium chloride  40 mEq Oral BID  . senna-docusate  2 tablet Oral BID  . sodium chloride flush  3 mL Intravenous Q12H  . sodium chloride flush  3 mL Intravenous Q12H   Continuous Infusions: . sodium chloride    .  ceFAZolin (ANCEF) IV Stopped (07/08/16 0708)  . furosemide Stopped (07/08/16 0935)   PRN Meds: acetaminophen **OR** acetaminophen, alum & mag hydroxide-simeth, cyclobenzaprine, fentaNYL (SUBLIMAZE) injection, ipratropium-albuterol, levalbuterol, menthol-cetylpyridinium **OR** phenol, metoprolol tartrate, ondansetron **OR** ondansetron (ZOFRAN) IV, oxyCODONE, sodium chloride flush, sodium chloride flush, white petrolatum, zolpidem   Vital Signs    Vitals:   07/07/16 0600 07/07/16 1448 07/07/16 1948 07/08/16 0631  BP: 116/64 119/65 130/63 (!) 124/58  Pulse: 67 (!) 56 64 64  Resp: 20 20 20 20   Temp: 97.5 F (36.4 C) 97.8 F (36.6 C) 99 F (37.2 C) 98.3 F (36.8 C)  TempSrc: Oral Oral Oral Oral  SpO2: 100% 99% 94% 95%  Weight:    194 lb 0.1 oz (88 kg)  Height:        Intake/Output Summary (Last 24 hours) at 07/08/16 1031 Last data filed at 07/07/16 2138  Gross per 24 hour  Intake              212 ml  Output             1800 ml  Net            -1588 ml   Filed  Weights   07/06/16 0422 07/07/16 0256 07/08/16 0631  Weight: 186 lb 14.4 oz (84.8 kg) 190 lb 14.7 oz (86.6 kg) 194 lb 0.1 oz (88 kg)    Telemetry    NSR - Personally Reviewed  ECG    07/02/2016-atrial fibrillation 103 bpm - Personally Reviewed  Physical Exam   GEN: No acute distress.   Neck: No JVD Cardiac: RRR, no murmurs, rubs, or gallops.  Respiratory: Clear to auscultation bilaterally. GI: Soft, nontender, non-distended  MS: No edema; No deformity. Neuro:  Nonfocal  Psych: Normal affect   Labs    Chemistry Recent Labs Lab 07/06/16 0432 07/07/16 0330 07/08/16 0451  NA 130*  131* 130*  132* 132*  131*  K 3.8  3.8 4.1  4.1 4.6  4.5  CL 90*  91* 91*  91* 90*  90*  CO2 30  30 28  31  34*  34*  GLUCOSE 117*  117* 160*  162* 105*  102*  BUN 11  11 11  11 11  10   CREATININE 1.14*  1.13* 1.14*  1.12* 1.18*  1.12*  CALCIUM 8.2*  8.2* 8.2*  8.2* 8.5*  8.4*  ALBUMIN 1.9* 1.8* 1.8*  GFRNONAA 47*  47* 47*  48* 45*  48*  GFRAA 54*  55* 54*  55* 52*  55*  ANIONGAP 10  10 11  10 8  7      Hematology Recent Labs Lab 07/06/16 0432 07/07/16 0330 07/08/16 0451  WBC 11.3* 10.8* 9.4  RBC 3.19* 2.97* 2.97*  HGB 8.3* 8.2* 7.8*  HCT 27.0* 24.9* 25.6*  MCV 84.6 83.8 86.2  MCH 26.0 27.6 26.3  MCHC 30.7 32.9 30.5  RDW 16.9* 17.5* 17.3*  PLT 135* 135* 167    Cardiac Enzymes Recent Labs Lab 07/01/16 1633 07/01/16 2253  TROPONINI <0.03 <0.03   No results for input(s): TROPIPOC in the last 168 hours.   BNPNo results for input(s): BNP, PROBNP in the last 168 hours.   DDimer No results for input(s): DDIMER in the last 168 hours.   Radiology    Mr Lumbar Spine W Wo Contrast  Result Date: 07/06/2016 CLINICAL DATA:  Worsening low back pain after incision and drainage of lumbar spine epidural abscess May 8th. EXAM: MRI LUMBAR SPINE WITHOUT AND WITH CONTRAST TECHNIQUE: Multiplanar and multiecho pulse sequences of the lumbar spine were obtained  without and with intravenous contrast. CONTRAST:  17mL MULTIHANCE GADOBENATE DIMEGLUMINE 529 MG/ML IV SOLN COMPARISON:  MRI of lumbar spine with contrast May 7th 2018 and MRI of the lumbar spine without contrast Jun 21, 2016 FINDINGS: SEGMENTATION: For the purposes of this report, the last well-formed intervertebral disc will be described as L5-S1. ALIGNMENT: Maintenance of the lumbar lordosis. No malalignment. VERTEBRAE:New mild widening L3-4 disc space with RIGHT T2 signal within the disc, and mild marginal enhancement. Remaining discs demonstrate normal morphology with slight desiccation. Multilevel mild chronic discogenic endplate changes and scattered old Schmorl's nodes. Scattered hemangiomata. Status post L4-5 and L5-S1 PLIF, hardware results in susceptibility artifact. CONUS MEDULLARIS: Conus medullaris terminates at T12-L1 and demonstrates normal morphology and signal characteristics. Fuzzy appearance of the cauda equina without enhancement equivocal for arachnoiditis, similar. PARASPINAL AND SOFT TISSUES: Similar epidural enhancement from L2 through the level of surgical intervention, with focal 15 x 6 mm residual ventral epidural abscess at L3. Resolution of fluid collections surrounding the pedicle screws. Enlarging 3.1 x 1.1 cm LEFT paraspinal fluid collection along the surgical approach, now tracking to the surgical bed. The previous 3.8 x 2 cm fluid collection within the surgical bed is now 2.5 x 2 cm with thick irregular marginal enhancement. Mild symmetric iliopsoas muscle denervation versus myositis. Paraspinal muscle denervation versus myositis. Ectatic infrarenal aorta. DISC LEVELS: L1-2: Small broad-based disc bulge. Mild facet arthropathy and ligamentum flavum redundancy without canal stenosis. Mild neural foraminal narrowing. Small broad-based disc bulge. Mild to moderate facet arthropathy and ligamentum flavum redundancy without canal stenosis. Mild bilateral neural foraminal narrowing. L3-4:  No disc bulge. Progressively widened facets to 4 mm, limited assessment for enhancement due to hardware artifact. Mild canal stenosis. Mild to moderate bilateral neural foraminal narrowing. L4-5: Posterior decompression. Mild thecal sac effacement due to epidural abscess. Posterior decompression. Phlegmon/abscess extending to the neural foramen bilaterally. L5-S1: PLIF. No canal stenosis. Phlegmon effacing the medial neural foramen bilaterally. IMPRESSION: New L3-4 discitis, with bilateral L3-4 facet effusions concerning for septic arthropathy. Similar epidural abscess. Consolidating abscess within the L4-5 surgical bed tracking into the paraspinal soft tissues. Surrounding paraspinal and iliopsoas myositis versus denervation. Mild canal stenosis L3-4. Phlegmon effacing the medial neural foramen at L4-5 and L5-S1. Electronically Signed   By: Pernell Dupre  Bloomer M.D.   On: 07/06/2016 21:40    Cardiac Studies   Cardiac catheterization 05/29/16-severe multivessel CAD-CABG  Echocardiogram 05/27/16-normal EF  Patient Profile     71 y.o. female with coronary artery disease status post CABG with septic arthritis, paroxysmal atrial fibrillation, acute diastolic heart failure  Assessment & Plan    Paroxysmal atrial fibrillation  - TEE cardioversion 07/04/16  - Continue anticoagulation, Eliquis  - Amiodarone 200 daily  - Prior left atrial appendage clipping  - Consider stopping aspirin 3 months post bypass and continuing Eliquis.  Acute diastolic heart failure  - PVC markedly volume overloaded.  - Continue IV lasix drip and will give another metolazone dose. Will give 2g IV MAG  - Total net out -8.9 L  - Creatinine 1.18 stable. Potassium 4.6.  - Weight was 173 on 06/21/16-currently 194.  CAD post CABG  - 05/30/16, Dr. Dorris Fetch, LAA clipping, wound excellent.  Anemia  - Hemoglobin 7.8-8.2, status post 2 units on 06/28/16  - Continue to monitor especially with anticoagulation.  Perivertebral septic  arthritis  - Both infectious disease and neurosurgery notes reviewed.  - Antibiotics    Signed, Donato Schultz, MD  07/08/2016, 10:31 AM
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
## 2016-07-08 NOTE — Progress Notes (Signed)
PHARMACY CONSULT NOTE FOR:  OUTPATIENT  PARENTERAL ANTIBIOTIC THERAPY (OPAT)  Indication: epidural and paravertebral abscesses Regimen: Ancef 2gm IV q8h End date: 08/18/17 (6 weeks from 5/19)  IV antibiotic discharge orders are pended. To discharging provider:  please sign these orders via discharge navigator,  Select New Orders & click on the button choice - Manage This Unsigned Work.     Thank you for allowing pharmacy to be a part of this patient's care.  Harland German, Pharm D 07/08/2016 12:10 PM

## 2016-07-09 ENCOUNTER — Inpatient Hospital Stay (HOSPITAL_COMMUNITY): Payer: Medicare HMO

## 2016-07-09 DIAGNOSIS — I4892 Unspecified atrial flutter: Secondary | ICD-10-CM

## 2016-07-09 LAB — CBC
HCT: 24.1 % — ABNORMAL LOW (ref 36.0–46.0)
Hemoglobin: 7.4 g/dL — ABNORMAL LOW (ref 12.0–15.0)
MCH: 26.1 pg (ref 26.0–34.0)
MCHC: 30.7 g/dL (ref 30.0–36.0)
MCV: 85.2 fL (ref 78.0–100.0)
Platelets: 164 10*3/uL (ref 150–400)
RBC: 2.83 MIL/uL — ABNORMAL LOW (ref 3.87–5.11)
RDW: 17.4 % — ABNORMAL HIGH (ref 11.5–15.5)
WBC: 8.3 10*3/uL (ref 4.0–10.5)

## 2016-07-09 LAB — RENAL FUNCTION PANEL
Albumin: 1.8 g/dL — ABNORMAL LOW (ref 3.5–5.0)
Anion gap: 8 (ref 5–15)
BUN: 11 mg/dL (ref 6–20)
CO2: 36 mmol/L — ABNORMAL HIGH (ref 22–32)
Calcium: 8.6 mg/dL — ABNORMAL LOW (ref 8.9–10.3)
Chloride: 88 mmol/L — ABNORMAL LOW (ref 101–111)
Creatinine, Ser: 1.16 mg/dL — ABNORMAL HIGH (ref 0.44–1.00)
GFR calc Af Amer: 53 mL/min — ABNORMAL LOW (ref >60)
GFR calc non Af Amer: 46 mL/min — ABNORMAL LOW (ref >60)
Glucose, Bld: 110 mg/dL — ABNORMAL HIGH (ref 65–99)
Phosphorus: 3.6 mg/dL (ref 2.5–4.6)
Potassium: 3.9 mmol/L (ref 3.5–5.1)
Sodium: 132 mmol/L — ABNORMAL LOW (ref 135–145)

## 2016-07-09 MED ORDER — METOLAZONE 5 MG PO TABS
5.0000 mg | ORAL_TABLET | Freq: Once | ORAL | Status: AC
  Administered 2016-07-09: 5 mg via ORAL
  Filled 2016-07-09: qty 1

## 2016-07-09 MED ORDER — METOPROLOL SUCCINATE ER 50 MG PO TB24
50.0000 mg | ORAL_TABLET | Freq: Every day | ORAL | Status: DC
  Administered 2016-07-10 – 2016-07-19 (×10): 50 mg via ORAL
  Filled 2016-07-09 (×10): qty 1

## 2016-07-09 MED ORDER — ORAL CARE MOUTH RINSE
15.0000 mL | Freq: Two times a day (BID) | OROMUCOSAL | Status: DC
  Administered 2016-07-09 – 2016-07-25 (×10): 15 mL via OROMUCOSAL

## 2016-07-09 NOTE — Progress Notes (Signed)
Progress Note  Patient Name: Erin [name] Date of Encounter: 07/09/2016  Primary Cardiologist: Dr. Daphene Jaeger  Subjective   Back pain. No CP. SOB is improved.   Inpatient Medications    Scheduled Meds: . amiodarone  200 mg Oral Daily  . apixaban  5 mg Oral BID  . aspirin EC  81 mg Oral Daily  . atorvastatin  80 mg Oral q1800  . cholecalciferol  2,000 Units Oral QPC breakfast  . cycloSPORINE  1 drop Both Eyes BID  . diltiazem  120 mg Oral Daily  . levothyroxine  112 mcg Oral QAC breakfast  . mouth rinse  15 mL Mouth Rinse BID  . metoprolol succinate  100 mg Oral Daily  . multivitamin with minerals  1 tablet Oral Daily  . mupirocin ointment   Topical BID  . pantoprazole  40 mg Oral QHS  . polyethylene glycol  17 g Oral BID  . potassium chloride  40 mEq Oral BID  . senna-docusate  2 tablet Oral BID  . sodium chloride flush  3 mL Intravenous Q12H  . sodium chloride flush  3 mL Intravenous Q12H   Continuous Infusions: . sodium chloride    .  ceFAZolin (ANCEF) IV Stopped (07/09/16 7017)  . furosemide 120 mg (07/09/16 0859)   PRN Meds: acetaminophen **OR** acetaminophen, alum & mag hydroxide-simeth, cyclobenzaprine, fentaNYL (SUBLIMAZE) injection, ipratropium-albuterol, levalbuterol, menthol-cetylpyridinium **OR** phenol, metoprolol tartrate, ondansetron **OR** ondansetron (ZOFRAN) IV, oxyCODONE, sodium chloride flush, sodium chloride flush, white petrolatum, zolpidem   Vital Signs    Vitals:   07/08/16 0631 07/08/16 1535 07/08/16 2043 07/09/16 0607  BP: (!) 124/58 (!) 104/50 (!) 122/59 108/62  Pulse: 64 (!) 55 60 (!) 53  Resp: 20 20 18 20   Temp: 98.3 F (36.8 C) 98.7 F (37.1 C) 98.5 F (36.9 C) 97.8 F (36.6 C)  TempSrc: Oral Oral Oral Oral  SpO2: 95% 98% 98% 97%  Weight: 194 lb 0.1 oz (88 kg)   192 lb 3.9 oz (87.2 kg)  Height:        Intake/Output Summary (Last 24 hours) at 07/09/16 0941 Last data filed at 07/09/16 7939  Gross per 24 hour  Intake               370 ml  Output             1900 ml  Net            -1530 ml   Filed Weights   07/07/16 0256 07/08/16 0631 07/09/16 0607  Weight: 190 lb 14.7 oz (86.6 kg) 194 lb 0.1 oz (88 kg) 192 lb 3.9 oz (87.2 kg)    Telemetry    NSR - Personally Reviewed  ECG    07/02/2016-AFIB 103- Personally Reviewed  Physical Exam   GEN: Well nourished, well developed, in no acute distress  HEENT: normal  Neck: no JVD, carotid bruits, or masses Cardiac: RRR; no murmurs, rubs, or gallops,no edema  Respiratory:  clear to auscultation bilaterally, normal work of breathing GI: soft, nontender, nondistended, + BS MS: no deformity or atrophy  Skin: warm and dry, no rash Neuro:  Alert and Oriented x 3, Strength and sensation are intact Psych: euthymic mood, full affect   Labs    Chemistry  Recent Labs Lab 07/07/16 0330 07/08/16 0451 07/09/16 0530  NA 130*  132* 132*  131* 132*  K 4.1  4.1 4.6  4.5 3.9  CL 91*  91* 90*  90* 88*  CO2  28  31 34*  34* 36*  GLUCOSE 160*  162* 105*  102* 110*  BUN 11  11 11  10 11   CREATININE 1.14*  1.12* 1.18*  1.12* 1.16*  CALCIUM 8.2*  8.2* 8.5*  8.4* 8.6*  ALBUMIN 1.8* 1.8* 1.8*  GFRNONAA 47*  48* 45*  48* 46*  GFRAA 54*  55* 52*  55* 53*  ANIONGAP 11  10 8  7 8      Hematology  Recent Labs Lab 07/07/16 0330 07/08/16 0451 07/09/16 0530  WBC 10.8* 9.4 8.3  RBC 2.97* 2.97* 2.83*  HGB 8.2* 7.8* 7.4*  HCT 24.9* 25.6* 24.1*  MCV 83.8 86.2 85.2  MCH 27.6 26.3 26.1  MCHC 32.9 30.5 30.7  RDW 17.5* 17.3* 17.4*  PLT 135* 167 164    Cardiac EnzymesNo results for input(s): TROPONINI in the last 168 hours. No results for input(s): TROPIPOC in the last 168 hours.   BNPNo results for input(s): BNP, PROBNP in the last 168 hours.   DDimer No results for input(s): DDIMER in the last 168 hours.   Radiology    No results found.  Cardiac Studies   Cardiac catheterization 05/29/16-severe multivessel CAD-CABG  Echocardiogram  05/27/16-normal EF  Patient Profile     71 y.o. female with coronary artery disease status post CABG with septic arthritis, paroxysmal atrial fibrillation, acute diastolic heart failure  Assessment & Plan    Paroxysmal atrial fibrillation  - TEE cardioversion 07/04/16  Huston Foley noted 50bpm. Will cut back metoprolol to 50.   - Continue anticoagulation, Eliquis  - Amiodarone 200 daily-no change  - Prior left atrial appendage clipping  - Consider stopping aspirin 3 months post bypass and continuing Eliquis.  Acute diastolic heart failure  - previously markedly volume overloaded.  - Continue IV lasix drip and will give another metolazone dose.   - Total net out -8.7 L  - Creatinine 1.16 stable. Potassium 3.9  - Weight was 173 on 06/21/16-currently 194.  - May be able to change to oral lasix tomorrow.   CAD post CABG  - 05/30/16, Dr. Dorris Fetch, LAA clipping, wound excellent.  Anemia  - Hemoglobin 7.4-8.2, status post 2 units on 06/28/16  - Continue to monitor especially with anticoagulation.  - may require additional tx  Perivertebral septic arthritis  - Both infectious disease and neurosurgery notes reviewed.  - Antibiotics  - Dr. Wynetta Emery note reviewed. CT scan.     Signed, Donato Schultz, MD  07/09/2016, 9:41 AM

## 2016-07-09 NOTE — Progress Notes (Signed)
Patient ID: Erin [name], female   DOB: 07-31-1945, 71 y.o.   MRN: [id number] Patient with continued severe back pain throughout the weekend has a heaviness feeling to her legs but she has good strength but whenever she stands up and weight bears or starts to ambulate the back and hip pain is worse.  Patient was seen the end of last week had a follow-up MRI scan did show some progression of infection into the joints facet joints and disc space at the level above her fusion at L3-4.  Seems represent at lack of response to the original antibiotics which have now been changed.     strength is somewhat pain limited but 5/5 lower extremities possibly some 4+ out of 5 hip flexor weakness in the right lower extremity.  Patchy decreased sensory loss.    Overall thought the MRI did not show something that we would need to rush in and Re evacuate.  However certainly I think it is concerning for increased instability at the level above her fusion and possibly some continued loosening of the L4 screws.  So I am going to order CT of her lumbar spine to further evaluate this better.   Also defer the question of whether to expander coverage with additional antibiotics to infectious disease moving into dual therapy as opposed to monotherapy with lack of response.  Will defer to them upon re-evaluation this morning.

## 2016-07-09 NOTE — Care Management Important Message (Signed)
Important Message  Patient Details  Name: Erin [name] MRN: [id number] Date of Birth: 1945-09-22   Medicare Important Message Given:  Yes    Erin [name] 07/09/2016, 1:00 PM

## 2016-07-09 NOTE — Progress Notes (Signed)
Telemetry called this RN at approximately 0940 stating that patient heart rate was sinus brady in low 50's. Dr. Anne Fu with cardiology aware and made adjustment to meds, no other orders at this time.
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
## 2016-07-09 NOTE — Progress Notes (Signed)
PT Cancellation Note  Patient Details Name: Erin [name] MRN: [id number] DOB: 1945/09/10   Cancelled Treatment:    Reason Eval/Treat Not Completed: Other (comment) (Noted CT suggests continued infection, pt states she is on bedrest.  Nurse states that pts activity level is unclear.   MD:  Please advise as to what activity level is desired for pt. ) Thanks.    [name] 07/09/2016, 12:45 PM [name],PT Acute Rehabilitation [phone] (pager)

## 2016-07-09 NOTE — Progress Notes (Signed)
PT Cancellation Note  Patient Details Name: Erin [name] MRN: [id number] DOB: 1945-06-17   Cancelled Treatment:    Reason Eval/Treat Not Completed:  (Pt going for CT due to incr back pain. Will wait for results) Thanks.    [name] 07/09/2016, 9:33 AM Eber Jones Acute Rehabilitation [phone] (pager)

## 2016-07-10 LAB — RENAL FUNCTION PANEL
Albumin: 1.9 g/dL — ABNORMAL LOW (ref 3.5–5.0)
Anion gap: 11 (ref 5–15)
BUN: 10 mg/dL (ref 6–20)
CO2: 37 mmol/L — ABNORMAL HIGH (ref 22–32)
Calcium: 8.9 mg/dL (ref 8.9–10.3)
Chloride: 82 mmol/L — ABNORMAL LOW (ref 101–111)
Creatinine, Ser: 1.24 mg/dL — ABNORMAL HIGH (ref 0.44–1.00)
GFR calc Af Amer: 49 mL/min — ABNORMAL LOW (ref >60)
GFR calc non Af Amer: 42 mL/min — ABNORMAL LOW (ref >60)
Glucose, Bld: 119 mg/dL — ABNORMAL HIGH (ref 65–99)
Phosphorus: 3.6 mg/dL (ref 2.5–4.6)
Potassium: 3.4 mmol/L — ABNORMAL LOW (ref 3.5–5.1)
Sodium: 130 mmol/L — ABNORMAL LOW (ref 135–145)

## 2016-07-10 LAB — CBC
HCT: 26 % — ABNORMAL LOW (ref 36.0–46.0)
Hemoglobin: 8.1 g/dL — ABNORMAL LOW (ref 12.0–15.0)
MCH: 26.2 pg (ref 26.0–34.0)
MCHC: 31.2 g/dL (ref 30.0–36.0)
MCV: 84.1 fL (ref 78.0–100.0)
Platelets: 185 10*3/uL (ref 150–400)
RBC: 3.09 MIL/uL — ABNORMAL LOW (ref 3.87–5.11)
RDW: 17.2 % — ABNORMAL HIGH (ref 11.5–15.5)
WBC: 11.5 10*3/uL — ABNORMAL HIGH (ref 4.0–10.5)

## 2016-07-10 LAB — PROTIME-INR
INR: 1.96
Prothrombin Time: 22.6 seconds — ABNORMAL HIGH (ref 11.4–15.2)

## 2016-07-10 LAB — MAGNESIUM: Magnesium: 1.4 mg/dL — ABNORMAL LOW (ref 1.7–2.4)

## 2016-07-10 LAB — HEPARIN LEVEL (UNFRACTIONATED): Heparin Unfractionated: >2.2 IU/mL — ABNORMAL HIGH (ref 0.30–0.70)

## 2016-07-10 LAB — C-REACTIVE PROTEIN: CRP: 16.4 mg/dL — ABNORMAL HIGH (ref <1.0)

## 2016-07-10 LAB — APTT: aPTT: 46 seconds — ABNORMAL HIGH (ref 24–36)

## 2016-07-10 LAB — SEDIMENTATION RATE: Sed Rate: 114 mm/hr — ABNORMAL HIGH (ref 0–22)

## 2016-07-10 MED ORDER — HEPARIN (PORCINE) IN NACL 100-0.45 UNIT/ML-% IJ SOLN
900.0000 [IU]/h | INTRAMUSCULAR | Status: DC
  Administered 2016-07-10: 1050 [IU]/h via INTRAVENOUS
  Administered 2016-07-11: 900 [IU]/h via INTRAVENOUS
  Filled 2016-07-10 (×2): qty 250

## 2016-07-10 MED ORDER — FUROSEMIDE 40 MG PO TABS
40.0000 mg | ORAL_TABLET | Freq: Every day | ORAL | Status: DC
  Administered 2016-07-10 – 2016-08-01 (×22): 40 mg via ORAL
  Filled 2016-07-10 (×23): qty 1

## 2016-07-10 MED ORDER — MAGNESIUM SULFATE 4 GM/100ML IV SOLN
4.0000 g | Freq: Once | INTRAVENOUS | Status: AC
  Administered 2016-07-10: 4 g via INTRAVENOUS
  Filled 2016-07-10: qty 100

## 2016-07-10 NOTE — Progress Notes (Signed)
When checking am pt o2 sat was in 70's on room air. 4L o2 applied. Pt now sating >95 on 2L o2. Continuous pulse ox order and applied. Will continue to follow.

## 2016-07-10 NOTE — Progress Notes (Signed)
ANTICOAGULATION CONSULT NOTE  Pharmacy Consult for heparin Indication: atrial fibrillation  Heparin Dosing Weight: 70.4 kg   Assessment: 70 yof on apixaban for afib, currently on hold for OR Thursday for lumbar revision surgery. Pharmacy consulted to dose heparin. Hg up 8.1, plt improved to 185. No bleed documented.  Last dose of apixaban given at 0844 this AM - will start heparin tonight.  Goal of Therapy:  Heparin level 0.3-0.7 units/ml aPTT 66-102 seconds Monitor platelets by anticoagulation protocol: Yes   Plan:  Baseline aPTT/heparin level No bolus. Start heparin at 1050 units/h at 2045 (12 hours after last dose of apixaban) 8h aPTT Daily aPTT/heparin level/CBC Monitor s/sx bleeding OR Thursday for procedure   Babs Bertin, PharmD, BCPS Clinical Pharmacist 07/10/2016 1:17 PM

## 2016-07-10 NOTE — Progress Notes (Signed)
Clinical Social Worker remains available  for support and discharge needs. Patient SNF [facility]) is aware patient is still not medically stable for discharge. Facility has attained insurance authorization through [employer] but authorization expired 07/09/16. Once patient becomes stable for discharge, CSW will redo authorization request.  Marrianne Mood, MSW,  [employer] [phone]

## 2016-07-10 NOTE — Progress Notes (Signed)
Patient ID: Erin [name], female   DOB: [birth year], 71 y.o.   MRN: [id number] Patient and lumbar back pain this morning no new lower extremity symptoms   she is afebrile heart rate 78 blood pressure stable  incision clean dry and intact lower extremity strength stable 5/5 distally 4+ out of 5 proximal right lower extremity   appears that white blood cell count is slightly elevated from yesterday not convincing Ancef is covering I have a call into infectious disease I would recommend we change her antibiotics include double coverage it seems like the patient responded best when she was on vanc and Zosyn would consider changing the to this preoperatively.  Planned going to the OR Thursday afternoon for revision surgery discussed with patient and family.
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
## 2016-07-10 NOTE — Progress Notes (Signed)
Progress Note  Patient Name: Erin [name] Date of Encounter: 07/10/2016  Primary Cardiologist: Dr. Daphene Jaeger  Subjective   Has continued back pain. No SOB, no orthopnea.   Inpatient Medications    Scheduled Meds: . amiodarone  200 mg Oral Daily  . apixaban  5 mg Oral BID  . aspirin EC  81 mg Oral Daily  . atorvastatin  80 mg Oral q1800  . cholecalciferol  2,000 Units Oral QPC breakfast  . cycloSPORINE  1 drop Both Eyes BID  . diltiazem  120 mg Oral Daily  . levothyroxine  112 mcg Oral QAC breakfast  . mouth rinse  15 mL Mouth Rinse BID  . metoprolol succinate  50 mg Oral Daily  . multivitamin with minerals  1 tablet Oral Daily  . mupirocin ointment   Topical BID  . pantoprazole  40 mg Oral QHS  . polyethylene glycol  17 g Oral BID  . potassium chloride  40 mEq Oral BID  . senna-docusate  2 tablet Oral BID  . sodium chloride flush  3 mL Intravenous Q12H  . sodium chloride flush  3 mL Intravenous Q12H   Continuous Infusions: . sodium chloride    .  ceFAZolin (ANCEF) IV Stopped (07/10/16 4098)  . furosemide 120 mg (07/10/16 0804)  . magnesium sulfate 1 - 4 g bolus IVPB 4 g (07/10/16 1005)   PRN Meds: acetaminophen **OR** acetaminophen, alum & mag hydroxide-simeth, cyclobenzaprine, fentaNYL (SUBLIMAZE) injection, ipratropium-albuterol, levalbuterol, menthol-cetylpyridinium **OR** phenol, metoprolol tartrate, ondansetron **OR** ondansetron (ZOFRAN) IV, oxyCODONE, sodium chloride flush, sodium chloride flush, white petrolatum, zolpidem   Vital Signs    Vitals:   07/09/16 2030 07/10/16 0528 07/10/16 0620 07/10/16 1017  BP: (!) 114/50 (!) 114/46  (!) 106/48  Pulse: 90 78  65  Resp: 20 20    Temp: 99.2 F (37.3 C) 98.6 F (37 C)    TempSrc: Oral Oral    SpO2: 95% (!) 70% 97%   Weight:  180 lb 8.9 oz (81.9 kg)    Height:        Intake/Output Summary (Last 24 hours) at 07/10/16 1148 Last data filed at 07/10/16 0515  Gross per 24 hour  Intake              250 ml    Output             1600 ml  Net            -1350 ml   Filed Weights   07/08/16 0631 07/09/16 0607 07/10/16 0528  Weight: 194 lb 0.1 oz (88 kg) 192 lb 3.9 oz (87.2 kg) 180 lb 8.9 oz (81.9 kg)    Telemetry    NSR now,  AFIB on tele at 10pm last night - Personally Reviewed  ECG    07/02/2016-AFIB 103- Personally Reviewed  Physical Exam   GEN: Well nourished, well developed, in no acute distress  HEENT: normal  Neck: no JVD, carotid bruits, or masses Cardiac: RRR; no murmurs, rubs, or gallops, mild LE edema  Respiratory:  clear to auscultation bilaterally, normal work of breathing GI: soft, nontender, nondistended, + BS, protuberant MS: no deformity or atrophy  Skin: warm and dry, no rash Neuro:  Alert and Oriented x 3, Strength and sensation are intact Psych: euthymic mood, full affect    Labs    Chemistry  Recent Labs Lab 07/08/16 0451 07/09/16 0530 07/10/16 0507  NA 132*  131* 132* 130*  K 4.6  4.5  3.9 3.4*  CL 90*  90* 88* 82*  CO2 34*  34* 36* 37*  GLUCOSE 105*  102* 110* 119*  BUN 11  10 11 10   CREATININE 1.18*  1.12* 1.16* 1.24*  CALCIUM 8.5*  8.4* 8.6* 8.9  ALBUMIN 1.8* 1.8* 1.9*  GFRNONAA 45*  48* 46* 42*  GFRAA 52*  55* 53* 49*  ANIONGAP 8  7 8 11      Hematology  Recent Labs Lab 07/08/16 0451 07/09/16 0530 07/10/16 0507  WBC 9.4 8.3 11.5*  RBC 2.97* 2.83* 3.09*  HGB 7.8* 7.4* 8.1*  HCT 25.6* 24.1* 26.0*  MCV 86.2 85.2 84.1  MCH 26.3 26.1 26.2  MCHC 30.5 30.7 31.2  RDW 17.3* 17.4* 17.2*  PLT 167 164 185    Cardiac EnzymesNo results for input(s): TROPONINI in the last 168 hours. No results for input(s): TROPIPOC in the last 168 hours.   BNPNo results for input(s): BNP, PROBNP in the last 168 hours.   DDimer No results for input(s): DDIMER in the last 168 hours.   Radiology    Ct Lumbar Spine Wo Contrast  Result Date: 07/09/2016 CLINICAL DATA:  Low back pain.  Discitis. EXAM: CT LUMBAR SPINE WITHOUT CONTRAST TECHNIQUE:  Multidetector CT imaging of the lumbar spine was performed without intravenous contrast administration. Multiplanar CT image reconstructions were also generated. COMPARISON:  Lumbar spine MRI 07/06/2016 and CT 05/24/2016 FINDINGS: Segmentation: Normal. Alignment: Trace retrolisthesis of L1 on L2, unchanged. Vertebrae: Multiple Schmorl's nodes as previously seen with an old compression fracture at L1. L2 and L3 vertebral body hemangiomas. Prior L4-S1 posterior and interbody fusion. Prominent lucency about the L4 screws has progressed from the prior CT, most notably posteriorly. The screws again terminate at the level of the L4 superior endplate. There is also mild lucency about the left L5 screw which has mildly increased. Subsidence of the L4-5 interbody cages into the superior L5 vertebral body does not appear significantly changed from the prior CT. Mild Schmorl's node deformity and mild erosive change in the L3 inferior endplate are new from the prior CT but similar in appearance to the recent MRI. Widening of the L3-4 facet joints bilaterally corresponds to fluid on the recent MRI with mild cortical erosive changes seen. Paraspinal and other soft tissues: Small left pleural effusion is partially visualized along with bibasilar atelectasis. Small layering stones are noted in the gallbladder. The paravertebral inflammatory changes are seen at L3-4 without discrete so last abscess on this unenhanced study. Postoperative changes and fluid collection posteriorly at L4-5 are better evaluated on the recent prior MRI. Epidural abscess described on MRI cannot be adequately evaluated on this study. Disc levels: Assessment of degenerative changes deferred to recent MRI. IMPRESSION: 1. Increased lucency about both L4 pedicle screws and L3-4 disc and endplate changes compatible with known infection. 2. Lucency about the left L5 screw which may reflect new loosening or infection. 3. Bilateral L3-4 facet joint widening and  erosion compatible with septic arthritis. Electronically Signed   By: Sebastian Ache M.D.   On: 07/09/2016 10:49    Cardiac Studies   Cardiac catheterization 05/29/16-severe multivessel CAD-CABG  Echocardiogram 05/27/16-normal EF  Patient Profile     71 y.o. female with coronary artery disease status post CABG with septic arthritis, paroxysmal atrial fibrillation, acute diastolic heart failure  Assessment & Plan    Paroxysmal atrial fibrillation  - TEE cardioversion 07/04/16  Huston Foley noted 50bpm yesterday. Cut back metoprolol to 50.   - Continue anticoagulation, Eliquis  -  Amiodarone 200 daily-no change  - Brief episode of what looks like AFIB on tele at 10pm last night.   - Prior left atrial appendage clipping  - Will stop eliquis in anticipation of surgery. (last dose 10AM 5/22).  Acute diastolic heart failure  - previously markedly volume overloaded.  - Stop IV lasix drip and give lasix 40 QD.   - Total net out -9.2 L  - Creatinine increased today from 1.16 to 1.24. Potassium 3.4  - Weight was 173 on 06/21/16-currently 180.    CAD post CABG  - 05/30/16, Dr. Dorris Fetch, LAA clipping, wound excellent.  Anemia  - Hemoglobin 7.4-8.2, status post 2 units on 06/28/16  - Continue to monitor  - may require additional tx  Vertebral septic arthritis  - Both infectious disease and neurosurgery notes reviewed.  - Antibiotics  - Dr. Wynetta Emery note reviewed. Surgery Thurs. OK from cardio perspective to proceed.     Signed, Donato Schultz, MD  07/10/2016, 11:48 AM

## 2016-07-10 NOTE — Progress Notes (Signed)
PT Cancellation Note  Patient Details Name: Erin [name] MRN: [id number] DOB: 09/29/1945   Cancelled Treatment:    Reason Eval/Treat Not Completed: Other (comment) (await clarification of pt activity order)   Marina Boerner B Shawna Kiener 07/10/2016, 7:36 AM  [name], PT [phone]

## 2016-07-10 NOTE — Consult Note (Signed)
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 CHEST 1 VIEW COMPARISON:  Radiograph of Jun 21, 2016. FINDINGS: Stable cardiomegaly. Status post coronary artery bypass graft. No pneumothorax or pleural effusion is noted. Stable minimal atelectasis seen in lingular and right midlung regions. Bony thorax is unremarkable. IMPRESSION: Stable minimal bilateral subsegmental atelectasis. Electronically Signed   By: Lupita Raider, M.D.   On: 06/24/2016 15:03   Dg Chest Port 1 View  Result Date: 06/21/2016 CLINICAL DATA:  Sepsis EXAM: PORTABLE CHEST 1 VIEW COMPARISON:  06/20/2016 FINDINGS: Minimal basilar atelectasis persist without worsening or new finding. Heart size is stable. Pulmonary vascularity is normal. Aortic atherosclerosis again noted. Previous CABG. IMPRESSION: Mild basilar atelectasis. Electronically Signed   By: Paulina Fusi M.D.   On: 06/21/2016 18:18    Labs:  CBC:  Recent Labs   07/07/16 0330 07/08/16 0451 07/09/16 0530 07/10/16 0507  WBC 10.8* 9.4 8.3 11.5*  HGB 8.2* 7.8* 7.4* 8.1*  HCT 24.9* 25.6* 24.1* 26.0*  PLT 135* 167 164 185    COAGS:  Recent Labs  05/30/16 0413 05/30/16 1513 06/21/16 1922 06/26/16 0936 07/07/16 0330  INR 1.07 1.28 2.17 2.06 2.25  APTT 72* 30 42*  --   --     BMP:  Recent Labs  07/07/16 0330 07/08/16 0451 07/09/16 0530 07/10/16 0507  NA 130*  132* 132*  131* 132* 130*  K 4.1  4.1 4.6  4.5 3.9 3.4*  CL 91*  91* 90*  90* 88* 82*  CO2 28  31 34*  34* 36* 37*  GLUCOSE 160*  162* 105*  102* 110* 119*  BUN 11  11 11  10 11 10   CALCIUM 8.2*  8.2* 8.5*  8.4* 8.6* 8.9  CREATININE 1.14*  1.12* 1.18*  1.12* 1.16* 1.24*  GFRNONAA  47*  48* 45*  48* 46* 42*  GFRAA 54*  55* 52*  55* 53* 49*    LIVER FUNCTION TESTS:  Recent Labs  05/24/16 1433 06/20/16 1139 06/21/16 1922  07/07/16 0330 07/08/16 0451 07/09/16 0530 07/10/16 0507  BILITOT 0.3 0.9 0.7  --   --   --   --   --   AST 22 30 31   --   --   --   --   --   ALT 16 18 19   --   --   --   --   --   ALKPHOS 129* 112 95  --   --   --   --   --   PROT 6.3* 5.3* 5.2*  --   --   --   --   --   ALBUMIN 3.4* 3.1* 2.1*  < > 1.8* 1.8* 1.8* 1.9*  < > = values in this interval not displayed.  TUMOR MARKERS: No results for input(s): AFPTM, CEA, CA199, CHROMGRNA in the last 8760 hours.  Assessment and Plan: Epidural/paravertebral abscesses Fluid collection and discitis at L3 Patient with increased back pain, elevated WBC.  ID following for antibiotic coverage.  IR consulted for possible aspiration of L3 fluid collection.  Dr. Corliss Skains has reviewed case and feels patient is appropriate.  Her INR is currently 2.25.  Her Elliquis has been d/c'd.   Anticipate procedure once INR <1.5. Patient is alert and oriented.  She is agreeable to procedure but would like for her son/HCPOA to provide consent.  Have attempted to call him x2 today with no response.    Will continue to address labs and consent. Patient aware of care plan and potential timeline.   Thank you for this interesting consult.  I greatly enjoyed meeting VIA ROSADO and look forward to participating in their care.  A copy of this report was sent to the requesting provider on this date.  Electronically Signed: Hoyt Koch, PA 07/10/2016, 1:46 PM   I spent a total of 40 Minutes    in face to face in clinical consultation, greater than 50% of which was counseling/coordinating care for L3 disc aspiration

## 2016-07-10 NOTE — Progress Notes (Signed)
PT Cancellation Note  Patient Details Name: Erin [name] MRN: [id number] DOB: 08-15-1945   Cancelled Treatment:    Reason Eval/Treat Not Completed: Other (comment) (planned surgery thur. will hold until after sx and reevaluate as medically appropriate)   [name] [name] 07/10/2016, 8:57 AM  [name], PT [phone]

## 2016-07-11 LAB — RENAL FUNCTION PANEL
Albumin: 1.9 g/dL — ABNORMAL LOW (ref 3.5–5.0)
Anion gap: 12 (ref 5–15)
BUN: 9 mg/dL (ref 6–20)
CO2: 40 mmol/L — ABNORMAL HIGH (ref 22–32)
Calcium: 8.8 mg/dL — ABNORMAL LOW (ref 8.9–10.3)
Chloride: 77 mmol/L — ABNORMAL LOW (ref 101–111)
Creatinine, Ser: 1.21 mg/dL — ABNORMAL HIGH (ref 0.44–1.00)
GFR calc Af Amer: 51 mL/min — ABNORMAL LOW (ref >60)
GFR calc non Af Amer: 44 mL/min — ABNORMAL LOW (ref >60)
Glucose, Bld: 121 mg/dL — ABNORMAL HIGH (ref 65–99)
Phosphorus: 3.8 mg/dL (ref 2.5–4.6)
Potassium: 2.9 mmol/L — ABNORMAL LOW (ref 3.5–5.1)
Sodium: 129 mmol/L — ABNORMAL LOW (ref 135–145)

## 2016-07-11 LAB — DIFFERENTIAL
Basophils Absolute: 0 10*3/uL (ref 0.0–0.1)
Basophils Relative: 0 %
Eosinophils Absolute: 0.2 10*3/uL (ref 0.0–0.7)
Eosinophils Relative: 2 %
Lymphocytes Relative: 8 %
Lymphs Abs: 0.8 10*3/uL (ref 0.7–4.0)
Monocytes Absolute: 1 10*3/uL (ref 0.1–1.0)
Monocytes Relative: 11 %
Neutro Abs: 7 10*3/uL (ref 1.7–7.7)
Neutrophils Relative %: 79 %

## 2016-07-11 LAB — CBC
HCT: 25.4 % — ABNORMAL LOW (ref 36.0–46.0)
Hemoglobin: 8.1 g/dL — ABNORMAL LOW (ref 12.0–15.0)
MCH: 26.6 pg (ref 26.0–34.0)
MCHC: 31.9 g/dL (ref 30.0–36.0)
MCV: 83.6 fL (ref 78.0–100.0)
Platelets: 156 10*3/uL (ref 150–400)
RBC: 3.04 MIL/uL — ABNORMAL LOW (ref 3.87–5.11)
RDW: 16.9 % — ABNORMAL HIGH (ref 11.5–15.5)
WBC: 9.2 10*3/uL (ref 4.0–10.5)

## 2016-07-11 LAB — HEPARIN LEVEL (UNFRACTIONATED): Heparin Unfractionated: >2.2 IU/mL — ABNORMAL HIGH (ref 0.30–0.70)

## 2016-07-11 LAB — APTT
aPTT: 109 seconds — ABNORMAL HIGH (ref 24–36)
aPTT: 101 seconds — ABNORMAL HIGH (ref 24–36)

## 2016-07-11 LAB — MAGNESIUM: Magnesium: 1.6 mg/dL — ABNORMAL LOW (ref 1.7–2.4)

## 2016-07-11 MED ORDER — POTASSIUM CHLORIDE 10 MEQ/100ML IV SOLN
10.0000 meq | INTRAVENOUS | Status: AC
  Administered 2016-07-11 (×3): 10 meq via INTRAVENOUS
  Filled 2016-07-11 (×3): qty 100

## 2016-07-11 MED ORDER — MAGNESIUM SULFATE 2 GM/50ML IV SOLN
2.0000 g | Freq: Once | INTRAVENOUS | Status: AC
  Administered 2016-07-11: 2 g via INTRAVENOUS
  Filled 2016-07-11: qty 50

## 2016-07-11 NOTE — Progress Notes (Signed)
Progress Note  Patient Name: Erin [name] Date of Encounter: 07/11/2016  Primary Cardiologist: Dr. Daphene Jaeger  Subjective   Comfortable. Son has noted some twitching occasionally in bed. No chest pain, no shortness of breath.   Inpatient Medications    Scheduled Meds: . amiodarone  200 mg Oral Daily  . aspirin EC  81 mg Oral Daily  . atorvastatin  80 mg Oral q1800  . cholecalciferol  2,000 Units Oral QPC breakfast  . cycloSPORINE  1 drop Both Eyes BID  . diltiazem  120 mg Oral Daily  . furosemide  40 mg Oral Daily  . levothyroxine  112 mcg Oral QAC breakfast  . mouth rinse  15 mL Mouth Rinse BID  . metoprolol succinate  50 mg Oral Daily  . multivitamin with minerals  1 tablet Oral Daily  . mupirocin ointment   Topical BID  . pantoprazole  40 mg Oral QHS  . polyethylene glycol  17 g Oral BID  . potassium chloride  40 mEq Oral BID  . senna-docusate  2 tablet Oral BID  . sodium chloride flush  3 mL Intravenous Q12H  . sodium chloride flush  3 mL Intravenous Q12H   Continuous Infusions: . sodium chloride 250 mL (07/11/16 0700)  .  ceFAZolin (ANCEF) IV Stopped (07/11/16 0630)  . heparin 950 Units/hr (07/11/16 0814)  . magnesium sulfate 1 - 4 g bolus IVPB    . potassium chloride 10 mEq (07/11/16 1025)   PRN Meds: acetaminophen **OR** acetaminophen, alum & mag hydroxide-simeth, cyclobenzaprine, fentaNYL (SUBLIMAZE) injection, ipratropium-albuterol, levalbuterol, menthol-cetylpyridinium **OR** phenol, metoprolol tartrate, ondansetron **OR** ondansetron (ZOFRAN) IV, oxyCODONE, sodium chloride flush, sodium chloride flush, white petrolatum, zolpidem   Vital Signs    Vitals:   07/10/16 1300 07/10/16 2033 07/11/16 0500 07/11/16 0933  BP:  (!) 108/55 (!) 119/58 (!) 119/56  Pulse:  64 70 63  Resp:  18 18   Temp:  98.3 F (36.8 C) 99.9 F (37.7 C)   TempSrc:  Oral Oral   SpO2:  97% 97%   Weight: 180 lb 8.9 oz (81.9 kg)  180 lb 8.9 oz (81.9 kg)   Height: 5\' 3"  (1.6 m)        Intake/Output Summary (Last 24 hours) at 07/11/16 1041 Last data filed at 07/11/16 1000  Gross per 24 hour  Intake           677.98 ml  Output             2300 ml  Net         -1622.02 ml   Filed Weights   07/10/16 0528 07/10/16 1300 07/11/16 0500  Weight: 180 lb 8.9 oz (81.9 kg) 180 lb 8.9 oz (81.9 kg) 180 lb 8.9 oz (81.9 kg)    Telemetry    NSR now,  AFIB on tele at 10pm last night - Personally Reviewed  ECG    07/02/2016-AFIB 103- Personally Reviewed  Physical Exam   GEN: Well nourished, well developed, in no acute distress  HEENT: normal  Neck: no JVD, carotid bruits, or masses Cardiac: RRR; no murmurs, rubs, or gallops, mild LE edema  Respiratory:  clear to auscultation bilaterally, normal work of breathing GI: soft, nontender, nondistended, + BS, protuberant MS: no deformity or atrophy  Skin: warm and dry, no rash Neuro:  Alert and Oriented x 3, Strength and sensation are intact Psych: euthymic mood, full affect No change in physical exam from yesterday.   Labs    Chemistry  Recent Labs Lab 07/09/16 0530 07/10/16 0507 07/11/16 0630  NA 132* 130* 129*  K 3.9 3.4* 2.9*  CL 88* 82* 77*  CO2 36* 37* 40*  GLUCOSE 110* 119* 121*  BUN 11 10 9   CREATININE 1.16* 1.24* 1.21*  CALCIUM 8.6* 8.9 8.8*  ALBUMIN 1.8* 1.9* 1.9*  GFRNONAA 46* 42* 44*  GFRAA 53* 49* 51*  ANIONGAP 8 11 12      Hematology  Recent Labs Lab 07/09/16 0530 07/10/16 0507 07/11/16 0630  WBC 8.3 11.5* 9.2  RBC 2.83* 3.09* 3.04*  HGB 7.4* 8.1* 8.1*  HCT 24.1* 26.0* 25.4*  MCV 85.2 84.1 83.6  MCH 26.1 26.2 26.6  MCHC 30.7 31.2 31.9  RDW 17.4* 17.2* 16.9*  PLT 164 185 156    Cardiac EnzymesNo results for input(s): TROPONINI in the last 168 hours. No results for input(s): TROPIPOC in the last 168 hours.   BNPNo results for input(s): BNP, PROBNP in the last 168 hours.   DDimer No results for input(s): DDIMER in the last 168 hours.   Radiology    No results  found.  Cardiac Studies   Cardiac catheterization 05/29/16-severe multivessel CAD-CABG  Echocardiogram 05/27/16-normal EF  Patient Profile     71 y.o. female with coronary artery disease status post CABG with septic arthritis, paroxysmal atrial fibrillation, acute diastolic heart failure  Assessment & Plan    Paroxysmal atrial fibrillation  - TEE cardioversion 07/04/16  Huston Foley noted 50bpm previously. Cut back metoprolol to 50.   - Continue anticoagulation, Eliquis  - Amiodarone 200 daily-no change  - Brief episode of what looks like AFIB on tele at 10pm 07/09/16.   - Prior left atrial appendage clipping  - Stopped eliquis in anticipation of surgery. (last dose 10AM 5/22).  Acute diastolic heart failure  - previously markedly volume overloaded.  - Stopped IV lasix drip and giving lasix 40 QD.   - Total net out -11 L  - Creatinine increased today from 1.16 to 1.21. Potassium 2.9, replete  - Weight was 173 on 06/21/16-currently 180.    CAD post CABG  - 05/30/16, Dr. Dorris Fetch, LAA clipping, wound excellent.  Anemia  - Hemoglobin 7.4-8.2, status post 2 units on 06/28/16  - Continue to monitor  - may require additional tx  Vertebral septic arthritis  - Both infectious disease and neurosurgery notes reviewed.  - Antibiotics  - Dr. Wynetta Emery note reviewed. Surgery Thurs. OK from cardio perspective to proceed.     Signed, Donato Schultz, MD  07/11/2016, 10:41 AM

## 2016-07-11 NOTE — Progress Notes (Signed)
[facility]    Date of Admission:  06/21/2016   Total days of antibiotics 15        Day 4 cefazolin           ID: Erin [name] is a 71 y.o. female with e.coli epidural abscess due to ecoli bacteremia Principal Problem:   Sepsis (HCC) Active Problems:   Spinal stenosis at L4-L5 level   Dyslipidemia   Acute midline low back pain without sciatica   Hypokalemia   CAD in native artery   Hx of CABG   AKI (acute kidney injury) (HCC)   Hyponatremia   Persistent atrial fibrillation (HCC)   Spinal abscess (HCC)   Acute on chronic diastolic (congestive) heart failure (HCC)   Atrial flutter (HCC)   Normocytic anemia   Paroxysmal atrial fibrillation (HCC)    Subjective: Ongoing low back pain. Muscle aches  Medications:  . amiodarone  200 mg Oral Daily  . aspirin EC  81 mg Oral Daily  . atorvastatin  80 mg Oral q1800  . cholecalciferol  2,000 Units Oral QPC breakfast  . cycloSPORINE  1 drop Both Eyes BID  . diltiazem  120 mg Oral Daily  . furosemide  40 mg Oral Daily  . levothyroxine  112 mcg Oral QAC breakfast  . mouth rinse  15 mL Mouth Rinse BID  . metoprolol succinate  50 mg Oral Daily  . multivitamin with minerals  1 tablet Oral Daily  . mupirocin ointment   Topical BID  . pantoprazole  40 mg Oral QHS  . polyethylene glycol  17 g Oral BID  . potassium chloride  40 mEq Oral BID  . senna-docusate  2 tablet Oral BID  . sodium chloride flush  3 mL Intravenous Q12H  . sodium chloride flush  3 mL Intravenous Q12H    Objective: Vital signs in last 24 hours: Temp:  [97.6 F (36.4 C)-99.9 F (37.7 C)] 98.2 F (36.8 C) (05/23 2007) Pulse Rate:  [61-70] 61 (05/23 2007) Resp:  [18] 18 (05/23 2007) BP: (110-119)/(55-61) 110/55 (05/23 2007) SpO2:  [97 %-100 %] 100 % (05/23 2007) Weight:  [180 lb 8.9 oz (81.9 kg)] 180 lb 8.9 oz (81.9 kg) (05/23 0500) gen = laying in bed in nad HEENT = dry oral mucosa,   Lab Results  Recent Labs   07/10/16 0507 07/11/16 0630  WBC 11.5* 9.2  HGB 8.1* 8.1*  HCT 26.0* 25.4*  NA 130* 129*  K 3.4* 2.9*  CL 82* 77*  CO2 37* 40*  BUN 10 9  CREATININE 1.24* 1.21*   Liver Panel  Recent Labs  07/10/16 0507 07/11/16 0630  ALBUMIN 1.9* 1.9*   Sedimentation Rate  Recent Labs  07/10/16 0855  ESRSEDRATE 114*   C-Reactive Protein  Recent Labs  07/10/16 0855  CRP 16.4*    Microbiology: 5/8 or cx - e.coli Studies/Results: No results found.   Assessment/Plan: Paraspinal/epidural abscess = repeat imaging suggests new areas of involvement. We have asked IR to do aspirate of newly affected area to see if it is polymicrobial infection, or still c/w known e.coli infection. Once aspirate is collected, can expanding abtx. Empirically while cultures are incubating, to include the addition of vancomycin.   Leukocytosis = improved, back to 9. Possibly elevated a few days ago due to stress v. Infection.   Anticoagulation = currently eliquis is being held in order to meet threshold to undergo aspiration by IR  Back pain = thought to  be in part due to loosening of hardware. Dr Wynetta Emery is planning to take back to the OR for revision tomorrow.  Hypokalemia/hypomagnesiumia = maybe also contributing to muscle ache/spasm. Primary team repleting  Spent 35 min speaking with her son discussing treatment plan.   Drue Second [facility] Cell: [phone] Pager: [phone]  07/11/2016, 10:20 PM

## 2016-07-11 NOTE — Progress Notes (Signed)
OT Cancellation Note  Patient Details Name: Erin [name] MRN: [id number] DOB: 12-Apr-1945   Cancelled Treatment:    Reason Eval/Treat Not Completed: Medical issues which prohibited therapy. Pt to have back surgery on Thursday. Will hold OT until after surgery.  Evern Bio 07/11/2016, 1:16 PM  [phone]

## 2016-07-11 NOTE — Progress Notes (Signed)
7 Days Post-Op Procedure(s) (LRB): TRANSESOPHAGEAL ECHOCARDIOGRAM (TEE) (N/A) CARDIOVERSION (N/A) Subjective: Complains of back pain, no chest pain  Objective: Vital signs in last 24 hours: Temp:  [98.3 F (36.8 C)-99.9 F (37.7 C)] 99.9 F (37.7 C) (05/23 0500) Pulse Rate:  [63-70] 63 (05/23 0933) Cardiac Rhythm: Heart block (05/23 0724) Resp:  [18-19] 18 (05/23 0500) BP: (101-119)/(48-58) 119/56 (05/23 0933) SpO2:  [95 %-97 %] 97 % (05/23 0500) Weight:  [180 lb 8.9 oz (81.9 kg)] 180 lb 8.9 oz (81.9 kg) (05/23 0500)  Hemodynamic parameters for last 24 hours:   Stable  Intake/Output from previous day: 05/22 0701 - 05/23 0700 In: 635 [I.V.:235; IV Piggyback:300] Out: 1650 [Urine:1650] Intake/Output this shift: Total I/O In: 293 [I.V.:143; IV Piggyback:150] Out: 650 [Urine:650]  Sternal incision clean and dry stable and healing well  Lab Results:  Recent Labs  07/10/16 0507 07/11/16 0630  WBC 11.5* 9.2  HGB 8.1* 8.1*  HCT 26.0* 25.4*  PLT 185 156   BMET:  Recent Labs  07/10/16 0507 07/11/16 0630  NA 130* 129*  K 3.4* 2.9*  CL 82* 77*  CO2 37* 40*  GLUCOSE 119* 121*  BUN 10 9  CREATININE 1.24* 1.21*  CALCIUM 8.9 8.8*    PT/INR:  Recent Labs  07/10/16 1536  LABPROT 22.6*  INR 1.96   ABG    Component Value Date/Time   PHART 7.301 (L) 05/30/2016 2139   HCO3 22.0 05/30/2016 2139   TCO2 24 05/31/2016 1802   ACIDBASEDEF 4.0 (H) 05/30/2016 2139   O2SAT 96.0 05/30/2016 2139   CBG (last 3)  No results for input(s): GLUCAP in the last 72 hours.  Assessment/Plan: S/P Procedure(s) (LRB): TRANSESOPHAGEAL ECHOCARDIOGRAM (TEE) (N/A) CARDIOVERSION (N/A) Dr. Dorris Fetch will follow-up the patient as an outpatient   LOS: 20 days    Kathlee Nations Trigt III 07/11/2016

## 2016-07-11 NOTE — Progress Notes (Signed)
Patient moving around in bed. Eyes are open but she is still sleep and has to be called several times. Tech states that patient was trying to get up the previous night, hollering for help with the call light in her hand,  and pulled off her tele box.  Additional rail up for safety, will continue to monitor. Bed alarm is on.
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
## 2016-07-11 NOTE — Progress Notes (Signed)
ANTICOAGULATION CONSULT NOTE - Follow Up Consult  Pharmacy Consult for heparin Indication: atrial fibrillation   Labs:  Recent Labs  07/09/16 0530 07/10/16 0507 07/10/16 1536 07/11/16 0630  HGB 7.4* 8.1*  --  8.1*  HCT 24.1* 26.0*  --  25.4*  PLT 164 185  --  156  APTT  --   --  46* 109*  LABPROT  --   --  22.6*  --   INR  --   --  1.96  --   HEPARINUNFRC  --   --  >2.20* >2.20*  CREATININE 1.16* 1.24*  --  1.21*     Assessment: 70yo female above goal on heparin with initial dosing while Eliquis on hold.  Goal of Therapy:  aPTT 66-102 seconds   Plan:  Will decrease heparin gtt by 1-2 units/kg/hr to 950 units/hr and check PTT in 8hr.  Vernard Gambles, PharmD, BCPS  07/11/2016,7:52 AM

## 2016-07-11 NOTE — Progress Notes (Addendum)
ANTICOAGULATION CONSULT NOTE  Pharmacy Consult for heparin Indication: atrial fibrillation  Heparin Dosing Weight: 70.4 kg   Assessment: 70 yof on apixaban for afib, currently on hold for OR Thursday for lumbar revision surgery. Pharmacy consulted to dose heparin. HgB stable 8.1, plt 156. No bleding or infusion related issues.   APTT: 101  Goal of Therapy:  Heparin level 0.3-0.7 units/ml aPTT 66-102 seconds Monitor platelets by anticoagulation protocol: Yes   Plan:  Reduce heparin gtt slightly to 900 units/hr 8h aPTT Daily aPTT/heparin level/CBC Monitor s/sx bleeding OR Thursday for procedure  Ruben Im, PharmD Clinical Pharmacist 07/11/2016 4:34 PM

## 2016-07-11 NOTE — Progress Notes (Signed)
Patient ID: Erin [name], female   DOB: [birth year], 71 y.o.   MRN: [id number]   Pt is scheduled in IR for disc aspiration as soon as appropriate LD Eliquis 5/22---need off 48 hrs INR 1.96  5/22 Need 1.5 or lower to safely proceed  Will plan on probable aspiration 5/24 if INR wnl

## 2016-07-12 ENCOUNTER — Encounter (HOSPITAL_COMMUNITY): Payer: Self-pay | Admitting: Interventional Radiology

## 2016-07-12 ENCOUNTER — Inpatient Hospital Stay (HOSPITAL_COMMUNITY): Payer: Medicare HMO

## 2016-07-12 HISTORY — PX: IR FLUORO GUIDED NEEDLE PLC ASPIRATION/INJECTION LOC: IMG2395

## 2016-07-12 LAB — APTT: aPTT: 92 seconds — ABNORMAL HIGH (ref 24–36)

## 2016-07-12 LAB — CBC
HCT: 25.3 % — ABNORMAL LOW (ref 36.0–46.0)
Hemoglobin: 7.9 g/dL — ABNORMAL LOW (ref 12.0–15.0)
MCH: 26.5 pg (ref 26.0–34.0)
MCHC: 31.2 g/dL (ref 30.0–36.0)
MCV: 84.9 fL (ref 78.0–100.0)
Platelets: 172 10*3/uL (ref 150–400)
RBC: 2.98 MIL/uL — ABNORMAL LOW (ref 3.87–5.11)
RDW: 17.4 % — ABNORMAL HIGH (ref 11.5–15.5)
WBC: 7.8 10*3/uL (ref 4.0–10.5)

## 2016-07-12 LAB — RENAL FUNCTION PANEL
Albumin: 1.9 g/dL — ABNORMAL LOW (ref 3.5–5.0)
Anion gap: 8 (ref 5–15)
BUN: 11 mg/dL (ref 6–20)
CO2: 41 mmol/L — ABNORMAL HIGH (ref 22–32)
Calcium: 8.5 mg/dL — ABNORMAL LOW (ref 8.9–10.3)
Chloride: 80 mmol/L — ABNORMAL LOW (ref 101–111)
Creatinine, Ser: 1.18 mg/dL — ABNORMAL HIGH (ref 0.44–1.00)
GFR calc Af Amer: 52 mL/min — ABNORMAL LOW (ref >60)
GFR calc non Af Amer: 45 mL/min — ABNORMAL LOW (ref >60)
Glucose, Bld: 96 mg/dL (ref 65–99)
Phosphorus: 3.1 mg/dL (ref 2.5–4.6)
Potassium: 3.6 mmol/L (ref 3.5–5.1)
Sodium: 129 mmol/L — ABNORMAL LOW (ref 135–145)

## 2016-07-12 LAB — MAGNESIUM: Magnesium: 1.9 mg/dL (ref 1.7–2.4)

## 2016-07-12 LAB — PROTIME-INR
INR: 1.51
Prothrombin Time: 18.4 seconds — ABNORMAL HIGH (ref 11.4–15.2)

## 2016-07-12 LAB — HEPARIN LEVEL (UNFRACTIONATED): Heparin Unfractionated: >2.2 IU/mL — ABNORMAL HIGH (ref 0.30–0.70)

## 2016-07-12 MED ORDER — TIZANIDINE HCL 4 MG PO TABS
4.0000 mg | ORAL_TABLET | Freq: Three times a day (TID) | ORAL | Status: DC | PRN
  Administered 2016-07-12 – 2016-07-27 (×18): 4 mg via ORAL
  Filled 2016-07-12 (×23): qty 1

## 2016-07-12 MED ORDER — MIDAZOLAM HCL 2 MG/2ML IJ SOLN
INTRAMUSCULAR | Status: AC
  Filled 2016-07-12: qty 4

## 2016-07-12 MED ORDER — MIDAZOLAM HCL 2 MG/2ML IJ SOLN
INTRAMUSCULAR | Status: AC | PRN
  Administered 2016-07-12 (×2): 1 mg via INTRAVENOUS

## 2016-07-12 MED ORDER — HEPARIN (PORCINE) IN NACL 100-0.45 UNIT/ML-% IJ SOLN
700.0000 [IU]/h | INTRAMUSCULAR | Status: DC
  Administered 2016-07-12 – 2016-07-13 (×2): 900 [IU]/h via INTRAVENOUS
  Administered 2016-07-14: 950 [IU]/h via INTRAVENOUS
  Administered 2016-07-15: 850 [IU]/h via INTRAVENOUS
  Administered 2016-07-17: 800 [IU]/h via INTRAVENOUS
  Filled 2016-07-12 (×4): qty 250

## 2016-07-12 MED ORDER — FENTANYL CITRATE (PF) 100 MCG/2ML IJ SOLN
INTRAMUSCULAR | Status: AC | PRN
  Administered 2016-07-12: 50 ug via INTRAVENOUS
  Administered 2016-07-12 (×2): 25 ug via INTRAVENOUS
  Administered 2016-07-12: 50 ug via INTRAVENOUS

## 2016-07-12 MED ORDER — BUPIVACAINE HCL (PF) 0.25 % IJ SOLN
INTRAMUSCULAR | Status: AC
  Filled 2016-07-12: qty 30

## 2016-07-12 MED ORDER — FENTANYL CITRATE (PF) 100 MCG/2ML IJ SOLN
INTRAMUSCULAR | Status: AC
  Filled 2016-07-12: qty 4

## 2016-07-12 MED ORDER — BUPIVACAINE HCL 0.25 % IJ SOLN
INTRAMUSCULAR | Status: AC | PRN
  Administered 2016-07-12: 5 mL

## 2016-07-12 MED ORDER — HYDROCODONE-ACETAMINOPHEN 7.5-325 MG/15ML PO SOLN
10.0000 mL | ORAL | Status: DC | PRN
  Administered 2016-07-12 – 2016-08-01 (×59): 10 mL via ORAL
  Filled 2016-07-12 (×63): qty 15

## 2016-07-12 NOTE — Care Management Important Message (Signed)
Important Message  Patient Details  Name: Erin [name] MRN: [id number] Date of Birth: 1945-03-26   Medicare Important Message Given:  Yes    [name] 07/12/2016, 10:23 AM

## 2016-07-12 NOTE — Progress Notes (Signed)
Progress Note  Patient Name: Erin [name] Date of Encounter: 07/12/2016  Primary Cardiologist: Dr. Daphene Jaeger  Subjective   Was confused. Thought she was being held hostage. Better oriented now that son is in room. No CP, no SOB. Just back pain.   Inpatient Medications    Scheduled Meds: . amiodarone  200 mg Oral Daily  . aspirin EC  81 mg Oral Daily  . atorvastatin  80 mg Oral q1800  . cholecalciferol  2,000 Units Oral QPC breakfast  . cycloSPORINE  1 drop Both Eyes BID  . diltiazem  120 mg Oral Daily  . furosemide  40 mg Oral Daily  . levothyroxine  112 mcg Oral QAC breakfast  . mouth rinse  15 mL Mouth Rinse BID  . metoprolol succinate  50 mg Oral Daily  . multivitamin with minerals  1 tablet Oral Daily  . mupirocin ointment   Topical BID  . pantoprazole  40 mg Oral QHS  . polyethylene glycol  17 g Oral BID  . potassium chloride  40 mEq Oral BID  . senna-docusate  2 tablet Oral BID  . sodium chloride flush  3 mL Intravenous Q12H  . sodium chloride flush  3 mL Intravenous Q12H   Continuous Infusions: . sodium chloride 250 mL (07/11/16 0700)  .  ceFAZolin (ANCEF) IV Stopped (07/11/16 2241)  . heparin Stopped (07/12/16 0833)   PRN Meds: acetaminophen **OR** acetaminophen, alum & mag hydroxide-simeth, cyclobenzaprine, fentaNYL (SUBLIMAZE) injection, ipratropium-albuterol, levalbuterol, menthol-cetylpyridinium **OR** phenol, metoprolol tartrate, ondansetron **OR** ondansetron (ZOFRAN) IV, oxyCODONE, sodium chloride flush, sodium chloride flush, white petrolatum   Vital Signs    Vitals:   07/11/16 0933 07/11/16 1415 07/11/16 2007 07/12/16 0458  BP: (!) 119/56 116/61 (!) 110/55 117/61  Pulse: 63 62 61 65  Resp:  18 18 18   Temp:  97.6 F (36.4 C) 98.2 F (36.8 C) 98.7 F (37.1 C)  TempSrc:  Oral Oral Oral  SpO2:  99% 100% 97%  Weight:    158 lb 1.1 oz (71.7 kg)  Height:        Intake/Output Summary (Last 24 hours) at 07/12/16 8921 Last data filed at 07/12/16  0500  Gross per 24 hour  Intake          1161.48 ml  Output             1650 ml  Net          -488.52 ml   Filed Weights   07/10/16 1300 07/11/16 0500 07/12/16 0458  Weight: 180 lb 8.9 oz (81.9 kg) 180 lb 8.9 oz (81.9 kg) 158 lb 1.1 oz (71.7 kg)    Telemetry    NSR now,  AFIB previously - Personally Reviewed  ECG    07/02/2016-AFIB 103- Personally Reviewed  Physical Exam   GEN: Well nourished, well developed, in no acute distress  HEENT: normal  Neck: no JVD, carotid bruits, or masses Cardiac: RRR; no murmurs, rubs, or gallops,no edema  Respiratory:  clear to auscultation bilaterally, normal work of breathing GI: soft, nontender, nondistended, + BS MS: no deformity or atrophy  Skin: warm and dry, no rash Neuro:  Alert but uncomfortable. Strength and sensation are intact Psych: uncomfortable.    Labs    Chemistry  Recent Labs Lab 07/10/16 0507 07/11/16 0630 07/12/16 0729  NA 130* 129* 129*  K 3.4* 2.9* 3.6  CL 82* 77* 80*  CO2 37* 40* 41*  GLUCOSE 119* 121* 96  BUN 10 9 11  CREATININE 1.24* 1.21* 1.18*  CALCIUM 8.9 8.8* 8.5*  ALBUMIN 1.9* 1.9* 1.9*  GFRNONAA 42* 44* 45*  GFRAA 49* 51* 52*  ANIONGAP 11 12 8      Hematology  Recent Labs Lab 07/10/16 0507 07/11/16 0630 07/12/16 0729  WBC 11.5* 9.2 7.8  RBC 3.09* 3.04* 2.98*  HGB 8.1* 8.1* 7.9*  HCT 26.0* 25.4* 25.3*  MCV 84.1 83.6 84.9  MCH 26.2 26.6 26.5  MCHC 31.2 31.9 31.2  RDW 17.2* 16.9* 17.4*  PLT 185 156 172    Cardiac EnzymesNo results for input(s): TROPONINI in the last 168 hours. No results for input(s): TROPIPOC in the last 168 hours.   BNPNo results for input(s): BNP, PROBNP in the last 168 hours.   DDimer No results for input(s): DDIMER in the last 168 hours.   Radiology    No results found.  Cardiac Studies   Cardiac catheterization 05/29/16-severe multivessel CAD-CABG  Echocardiogram 05/27/16-normal EF  Patient Profile     71 y.o. female with coronary artery disease  status post CABG with septic arthritis, paroxysmal atrial fibrillation, acute diastolic heart failure  Assessment & Plan    Paroxysmal atrial fibrillation  - TEE cardioversion 07/04/16  Huston Foley noted 50bpm previously. Cut back metoprolol to 50.   - Continue anticoagulation, Eliquis  - Amiodarone 200 daily  - Prior left atrial appendage clipping  - Stopped eliquis in anticipation of surgery. (last dose 10AM 5/22).  - NSR today, no change  Acute diastolic heart failure  - previously markedly volume overloaded.  - Stopped IV lasix drip and giving lasix 40 QD.   - Total net out -11 L  - Creatinine increased today from 1.16 to 1.21. Potassium 2.9 yesterday now 3.6, replete  - Weight was 173 on 06/21/16-currently 180.  - Stable    CAD post CABG  - 05/30/16, Dr. Dorris Fetch, LAA clipping, wound excellent.  Anemia  - Hemoglobin 7.4-8.2, status post 2 units on 06/28/16  - Continue to monitor  - may require additional tx  Vertebral septic arthritis  - Both infectious disease and neurosurgery notes reviewed.  - Antibiotics  - Dr. Wynetta Emery note reviewed. IR to obtain samples today. ID note reviewed.   Delirium  - now improved.    Signed, Donato Schultz, MD  07/12/2016, 9:27 AM

## 2016-07-12 NOTE — Progress Notes (Addendum)
ANTICOAGULATION CONSULT NOTE - Follow Up Consult  Pharmacy Consult for Heparin Indication: atrial fibrillation  Allergies  Allergen Reactions  . Ace Inhibitors Swelling    ACE stopped after pt seen in ED with facial swelling- allergy testing pending  . Morphine And Related   . Codeine Nausea And Vomiting    Patient Measurements: Height: 5\' 3"  (160 cm) Weight: 158 lb 1.1 oz (71.7 kg) IBW/kg (Calculated) : 52.4  Vital Signs: Temp: 98.7 F (37.1 C) (05/24 0458) Temp Source: Oral (05/24 0458) BP: 117/61 (05/24 0458) Pulse Rate: 65 (05/24 0458)  Labs:  Recent Labs  07/10/16 0507  07/10/16 1536 07/11/16 0630 07/11/16 1549 07/12/16 0729  HGB 8.1*  --   --  8.1*  --  7.9*  HCT 26.0*  --   --  25.4*  --  25.3*  PLT 185  --   --  156  --  172  APTT  --   < > 46* 109* 101* 92*  LABPROT  --   --  22.6*  --   --  18.4*  INR  --   --  1.96  --   --  1.51  HEPARINUNFRC  --   --  >2.20* >2.20*  --  >2.20*  CREATININE 1.24*  --   --  1.21*  --  1.18*  < > = values in this interval not displayed.  Estimated Creatinine Clearance: 40.9 mL/min (A) (by C-G formula based on SCr of 1.18 mg/dL (H)).   Medications:  Scheduled:  . amiodarone  200 mg Oral Daily  . aspirin EC  81 mg Oral Daily  . atorvastatin  80 mg Oral q1800  . cholecalciferol  2,000 Units Oral QPC breakfast  . cycloSPORINE  1 drop Both Eyes BID  . diltiazem  120 mg Oral Daily  . furosemide  40 mg Oral Daily  . levothyroxine  112 mcg Oral QAC breakfast  . mouth rinse  15 mL Mouth Rinse BID  . metoprolol succinate  50 mg Oral Daily  . multivitamin with minerals  1 tablet Oral Daily  . mupirocin ointment   Topical BID  . pantoprazole  40 mg Oral QHS  . polyethylene glycol  17 g Oral BID  . potassium chloride  40 mEq Oral BID  . senna-docusate  2 tablet Oral BID  . sodium chloride flush  3 mL Intravenous Q12H  . sodium chloride flush  3 mL Intravenous Q12H    Assessment: 70 yof on apixaban for afib, currently  on hold for OR today for lumbar revision surgery and bridging with heparin.  Heparin level reflecting ongoing Apixaban effect, aPTT within goal.  Hg stable, pltc wnl.  No bleeding noted.  Heparin has been turned off for OR.  Goal of Therapy:  Heparin level 0.3-0.7 units/ml Monitor platelets by anticoagulation protocol: Yes   Plan:  F/U after OR for plans to resume anticoagulation  Alvester Morin., PharmD Clinical Pharmacist [facility]   07/12/16  Now s/p IR, aspiration of L3-4 space, and OK to resume heparin in 6hr.   1515  -Resume heparin 900 units/hr, at 7PM -Heparin level and aPTT in 8hr -Daily heparin level, aPTT   Alvester Morin., PharmD Clinical Pharmacist [place] [facility]

## 2016-07-12 NOTE — Progress Notes (Signed)
[facility]    Date of Admission:  06/21/2016   Total days of antibiotics 15        Day 5 Cefazolin         ID: Erin [name] is a 71 y.o. female with  e.coli epidural abscess due to ecoli bacteremia  Principal Problem:   Sepsis (HCC) Active Problems:   Spinal stenosis at L4-L5 level   Dyslipidemia   Acute midline low back pain without sciatica   Hypokalemia   CAD in native artery   Hx of CABG   AKI (acute kidney injury) (HCC)   Hyponatremia   Persistent atrial fibrillation (HCC)   Spinal abscess (HCC)   Acute on chronic diastolic (congestive) heart failure (HCC)   Atrial flutter (HCC)   Normocytic anemia   Paroxysmal atrial fibrillation (HCC)    Subjective: Complains of back pain. She seems to be a little confused today. Getting ready for procedure.  Medications:  . fentaNYL      . midazolam      . amiodarone  200 mg Oral Daily  . aspirin EC  81 mg Oral Daily  . atorvastatin  80 mg Oral q1800  . cholecalciferol  2,000 Units Oral QPC breakfast  . cycloSPORINE  1 drop Both Eyes BID  . diltiazem  120 mg Oral Daily  . furosemide  40 mg Oral Daily  . levothyroxine  112 mcg Oral QAC breakfast  . mouth rinse  15 mL Mouth Rinse BID  . metoprolol succinate  50 mg Oral Daily  . multivitamin with minerals  1 tablet Oral Daily  . mupirocin ointment   Topical BID  . pantoprazole  40 mg Oral QHS  . polyethylene glycol  17 g Oral BID  . potassium chloride  40 mEq Oral BID  . senna-docusate  2 tablet Oral BID  . sodium chloride flush  3 mL Intravenous Q12H  . sodium chloride flush  3 mL Intravenous Q12H    Objective: Vital signs in last 24 hours: Temp:  [97.6 F (36.4 C)-98.7 F (37.1 C)] 98.7 F (37.1 C) (05/24 0458) Pulse Rate:  [61-70] 70 (05/24 1129) Resp:  [16-18] 16 (05/24 1129) BP: (110-131)/(55-74) 131/74 (05/24 1129) SpO2:  [96 %-100 %] 96 % (05/24 1129) Weight:  [158 lb 1.1 oz (71.7 kg)] 158 lb 1.1 oz (71.7 kg) (05/24  0458)   General appearance: alert, no distress and appears uncomfortable lying in bed Back: lumbar incision assessed - clean and dry with approximated tissues. No warmth/erythema. Firm swelling to right of incision palpated that is tender. Limited ROM Resp: clear to auscultation bilaterally Cardio: regular rate and rhythm and S1, S2 normal GI: soft, non-tender; bowel sounds normal; no masses,  no organomegaly Pulses: 2+ and symmetric Skin: Skin color, texture, turgor normal. No rashes or lesions or multiple bruises noted to extremeties  Neurologic: Alert and oriented X 3, normal strength and tone. Normal symmetric reflexes. Normal coordination and gait Incision/Wound: back wound as described above. LUE PICC line site unremarkable.   Lab Results  Recent Labs  07/11/16 0630 07/12/16 0729  WBC 9.2 7.8  HGB 8.1* 7.9*  HCT 25.4* 25.3*  NA 129* 129*  K 2.9* 3.6  CL 77* 80*  CO2 40* 41*  BUN 9 11  CREATININE 1.21* 1.18*   Liver Panel  Recent Labs  07/11/16 0630 07/12/16 0729  ALBUMIN 1.9* 1.9*   Sedimentation Rate  Recent Labs  07/10/16 0855  ESRSEDRATE 114*  C-Reactive Protein  Recent Labs  07/10/16 0855  CRP 16.4*    Microbiology:  Studies/Results: No results found.   Assessment/Plan: Paraspinal/epidural abscess = repeat imaging suggests new areas of involvement. Going for fluid aspiration today. WBC improved on cefazolin monotherapy.   Leukocytosis = improved, back to 7.8. Possibly elevated a few days ago due to stress v. Infection.   Anticoagulation = currently eliquis is being held in order to meet threshold to undergo aspiration by IR. She is on heparin bridge. INR 1.5 this morning.   Back pain = thought to be in part due to loosening of hardware. Dr Wynetta Emery is planning to take back to the OR for revision pending culture of aspirate.  Hypokalemia/hypomagnesiumia = Improved with supplementation.   Rexene Alberts, MSN, NP-C [facility] Cell: [phone] Pager: [phone]  07/12/2016, 12:19 PM
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
## 2016-07-12 NOTE — Sedation Documentation (Signed)
Patient denies pain and is resting comfortably.  
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
## 2016-07-12 NOTE — Procedures (Signed)
Aspiration of L3-4 disc space under fluoro 7ml thin blood tinged purulent fluid out, sent for GS, C&S No complication No blood loss. See complete dictation in [facility].

## 2016-07-12 NOTE — Sedation Documentation (Signed)
Patient is resting comfortably. 

## 2016-07-12 NOTE — Sedation Documentation (Addendum)
Patient is resting comfortably.  Denies pain.

## 2016-07-12 NOTE — Progress Notes (Signed)
Patient concerned for patient safety, assistance with bathroom and pain control. Family stated in morning that they would be staying with patient all day.  Patient will need safety sitter tonight as family is fatigued. Pt resting with call bell within reach.  Will continue to monitor. Thomas Hoff, RN

## 2016-07-12 NOTE — Progress Notes (Addendum)
Patient ID: Erin [name], female   DOB: 12-24-1945, 71 y.o.   MRN: [id number]   INR 1.5 today--- IR plans to move ahead with L4-5 disc aspiration today.  RN to turn off Hep now  (830 am)

## 2016-07-12 NOTE — Sedation Documentation (Signed)
Pain medication given to patient for comfort before and during positioning patient for procedure. Tolerated position without incident. Currently in position of comfort.

## 2016-07-12 NOTE — Sedation Documentation (Signed)
Patient moving on table even with nurse instructing patient to lay still while needle in currently in patient's back. Patient able to follow directions however continues to move.

## 2016-07-13 LAB — RENAL FUNCTION PANEL
Albumin: 2 g/dL — ABNORMAL LOW (ref 3.5–5.0)
Anion gap: 9 (ref 5–15)
BUN: 10 mg/dL (ref 6–20)
CO2: 41 mmol/L — ABNORMAL HIGH (ref 22–32)
Calcium: 8.9 mg/dL (ref 8.9–10.3)
Chloride: 80 mmol/L — ABNORMAL LOW (ref 101–111)
Creatinine, Ser: 1.17 mg/dL — ABNORMAL HIGH (ref 0.44–1.00)
GFR calc Af Amer: 53 mL/min — ABNORMAL LOW (ref >60)
GFR calc non Af Amer: 45 mL/min — ABNORMAL LOW (ref >60)
Glucose, Bld: 104 mg/dL — ABNORMAL HIGH (ref 65–99)
Phosphorus: 3.3 mg/dL (ref 2.5–4.6)
Potassium: 3.3 mmol/L — ABNORMAL LOW (ref 3.5–5.1)
Sodium: 130 mmol/L — ABNORMAL LOW (ref 135–145)

## 2016-07-13 LAB — CBC
HCT: 33.6 % — ABNORMAL LOW (ref 36.0–46.0)
Hemoglobin: 10.6 g/dL — ABNORMAL LOW (ref 12.0–15.0)
MCH: 26.9 pg (ref 26.0–34.0)
MCHC: 31.5 g/dL (ref 30.0–36.0)
MCV: 85.3 fL (ref 78.0–100.0)
Platelets: 159 10*3/uL (ref 150–400)
RBC: 3.94 MIL/uL (ref 3.87–5.11)
RDW: 18 % — ABNORMAL HIGH (ref 11.5–15.5)
WBC: 5.4 10*3/uL (ref 4.0–10.5)

## 2016-07-13 LAB — APTT
aPTT: 93 seconds — ABNORMAL HIGH (ref 24–36)
aPTT: 83 seconds — ABNORMAL HIGH (ref 24–36)

## 2016-07-13 LAB — MAGNESIUM: Magnesium: 1.6 mg/dL — ABNORMAL LOW (ref 1.7–2.4)

## 2016-07-13 LAB — HEPARIN LEVEL (UNFRACTIONATED): Heparin Unfractionated: >2.2 IU/mL — ABNORMAL HIGH (ref 0.30–0.70)

## 2016-07-13 MED ORDER — MAGNESIUM SULFATE IN D5W 1-5 GM/100ML-% IV SOLN
1.0000 g | Freq: Once | INTRAVENOUS | Status: AC
  Administered 2016-07-13: 1 g via INTRAVENOUS
  Filled 2016-07-13: qty 100

## 2016-07-13 NOTE — Progress Notes (Signed)
ANTICOAGULATION CONSULT NOTE   Pharmacy Consult for Heparin Indication: atrial fibrillation  Allergies  Allergen Reactions  . Ace Inhibitors Swelling    ACE stopped after pt seen in ED with facial swelling- allergy testing pending  . Morphine And Related   . Codeine Nausea And Vomiting    Patient Measurements: Height: 5\' 3"  (160 cm) Weight: 180 lb 1.9 oz (81.7 kg) IBW/kg (Calculated) : 52.4  Vital Signs: Temp: 98.6 F (37 C) (05/25 1512) Temp Source: Oral (05/25 1512) BP: 121/56 (05/25 1512) Pulse Rate: 61 (05/25 1512)  Labs:  Recent Labs  07/11/16 0630  07/12/16 0729 07/13/16 0610 07/13/16 1603  HGB 8.1*  --  7.9* 10.6*  --   HCT 25.4*  --  25.3* 33.6*  --   PLT 156  --  172 159  --   APTT 109*  < > 92* 93* 83*  LABPROT  --   --  18.4*  --   --   INR  --   --  1.51  --   --   HEPARINUNFRC >2.20*  --  >2.20* >2.20*  --   CREATININE 1.21*  --  1.18* 1.17*  --   < > = values in this interval not displayed.  Estimated Creatinine Clearance: 44 mL/min (A) (by C-G formula based on SCr of 1.17 mg/dL (H)).  Assessment: 70 yof on apixaban PTA for afib, apixaban is on hold peri-operatively for lumbar revision surgery- pt is being bridged with heparin.  Last dose of Eliquis was 5/22. Heparin level reflecting ongoing Apixaban effect, aPTT continues to be within goal on follow up level.  Goal of Therapy:  APTT 66-102 sec Heparin level 0.3-0.7 units/ml Monitor platelets by anticoagulation protocol: Yes    Plan:  -Continue heparin at 900 units/hr -Daily heparin level, aPTT  -Check confirmatory aPTT this afternoon  Sheppard Coil PharmD., BCPS Clinical Pharmacist Pager [phone] 07/13/2016 4:43 PM
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
## 2016-07-13 NOTE — Progress Notes (Signed)
Progress Note  Patient Name: Erin [name] Date of Encounter: 07/13/2016  Primary Cardiologist: Dr. Daphene Jaeger  Subjective   Post IR procedure, back. No CP, no SOB, no orthopnea. Wants to eat and sit up. Cant sit up she says  Inpatient Medications    Scheduled Meds: . amiodarone  200 mg Oral Daily  . aspirin EC  81 mg Oral Daily  . atorvastatin  80 mg Oral q1800  . cholecalciferol  2,000 Units Oral QPC breakfast  . cycloSPORINE  1 drop Both Eyes BID  . diltiazem  120 mg Oral Daily  . furosemide  40 mg Oral Daily  . levothyroxine  112 mcg Oral QAC breakfast  . mouth rinse  15 mL Mouth Rinse BID  . metoprolol succinate  50 mg Oral Daily  . multivitamin with minerals  1 tablet Oral Daily  . mupirocin ointment   Topical BID  . pantoprazole  40 mg Oral QHS  . polyethylene glycol  17 g Oral BID  . potassium chloride  40 mEq Oral BID  . senna-docusate  2 tablet Oral BID  . sodium chloride flush  3 mL Intravenous Q12H  . sodium chloride flush  3 mL Intravenous Q12H   Continuous Infusions: . sodium chloride 250 mL (07/11/16 0700)  .  ceFAZolin (ANCEF) IV Stopped (07/13/16 0138)  . heparin 900 Units/hr (07/12/16 1859)   PRN Meds: acetaminophen **OR** acetaminophen, alum & mag hydroxide-simeth, fentaNYL (SUBLIMAZE) injection, HYDROcodone-acetaminophen, ipratropium-albuterol, levalbuterol, menthol-cetylpyridinium **OR** phenol, metoprolol tartrate, ondansetron **OR** ondansetron (ZOFRAN) IV, sodium chloride flush, sodium chloride flush, tiZANidine, white petrolatum   Vital Signs    Vitals:   07/12/16 2108 07/12/16 2118 07/13/16 0435 07/13/16 0600  BP: (!) 106/50  (!) 121/47   Pulse: 69  66   Resp: 20  18   Temp: 98.1 F (36.7 C)  98.4 F (36.9 C)   TempSrc: Oral  Oral   SpO2: 91% 98% 95%   Weight:    180 lb 1.9 oz (81.7 kg)  Height:        Intake/Output Summary (Last 24 hours) at 07/13/16 0841 Last data filed at 07/12/16 2222  Gross per 24 hour  Intake                10 ml  Output              700 ml  Net             -690 ml   Filed Weights   07/11/16 0500 07/12/16 0458 07/13/16 0600  Weight: 180 lb 8.9 oz (81.9 kg) 158 lb 1.1 oz (71.7 kg) 180 lb 1.9 oz (81.7 kg)    Telemetry    NSR,  AFIB previously - Personally Reviewed  ECG    07/02/2016-AFIB 103- Personally Reviewed  Physical Exam   GEN: Well nourished, well developed, in no acute distress  HEENT: normal  Neck: no JVD, carotid bruits, or masses Cardiac: RRR; no murmurs, rubs, or gallops,no edema  Respiratory:  clear to auscultation bilaterally, normal work of breathing GI: soft, nontender, nondistended, + BS MS: no deformity or atrophy  Skin: warm and dry, no rash Neuro:  Alert and Oriented x 3, Strength and sensation are intact Psych: delirium improved   Labs    Chemistry  Recent Labs Lab 07/11/16 0630 07/12/16 0729 07/13/16 0610  NA 129* 129* 130*  K 2.9* 3.6 3.3*  CL 77* 80* 80*  CO2 40* 41* 41*  GLUCOSE 121* 96 104*  BUN 9 11 10   CREATININE 1.21* 1.18* 1.17*  CALCIUM 8.8* 8.5* 8.9  ALBUMIN 1.9* 1.9* 2.0*  GFRNONAA 44* 45* 45*  GFRAA 51* 52* 53*  ANIONGAP 12 8 9      Hematology  Recent Labs Lab 07/11/16 0630 07/12/16 0729 07/13/16 0610  WBC 9.2 7.8 5.4  RBC 3.04* 2.98* 3.94  HGB 8.1* 7.9* 10.6*  HCT 25.4* 25.3* 33.6*  MCV 83.6 84.9 85.3  MCH 26.6 26.5 26.9  MCHC 31.9 31.2 31.5  RDW 16.9* 17.4* 18.0*  PLT 156 172 159    Cardiac EnzymesNo results for input(s): TROPONINI in the last 168 hours. No results for input(s): TROPIPOC in the last 168 hours.   BNPNo results for input(s): BNP, PROBNP in the last 168 hours.   DDimer No results for input(s): DDIMER in the last 168 hours.   Radiology    Ir Fluoro Guide Ndl Plmt / Bx  Result Date: 07/12/2016 INDICATION: Instrumented lumbar fusion, with postop infection. Recent MR suggest progressive discitis L3-4. EXAM: FLUOROSCOPIC GUIDED LUMBAR DISC ASPIRATION MEDICATIONS: None. ANESTHESIA/SEDATION:  Intravenous Fentanyl and Versed were administered as conscious sedation during continuous monitoring of the patient's level of consciousness and physiological / cardiorespiratory status by the radiology RN, with a total moderate sedation time of 10 minutes. PROCEDURE: Informed written consent was obtained from the patient after a thorough discussion of the procedural risks, benefits and alternatives. All questions were addressed. Maximal Sterile Barrier Technique was utilized including caps, mask, sterile gowns, sterile gloves, sterile drape, hand hygiene and skin antiseptic. A timeout was performed prior to the initiation of the procedure. Under fluoroscopic guidance, a 16 gauge trocar needle was advanced into the central third of the L3-4 interspace from a right parasagittal approach, needle position confirmed on AP and lateral fluoroscopy. Approximately 5 mL of thin blood-tinged purulent fluid were aspirated, sent for the requested laboratory studies. The patient tolerated the procedure well. FLUOROSCOPY TIME:  30 seconds, 13 mGy COMPLICATIONS: None immediate. IMPRESSION: 1. Technically successful fluoroscopic guided lumbar L3-4 disc aspiration. Sample sent for Gram stain and culture Electronically Signed   By: Corlis Leak M.D.   On: 07/12/2016 13:46    Cardiac Studies   Cardiac catheterization 05/29/16-severe multivessel CAD-CABG  Echocardiogram 05/27/16-normal EF  Patient Profile     71 y.o. female with coronary artery disease status post CABG with septic arthritis, paroxysmal atrial fibrillation, acute diastolic heart failure  Assessment & Plan    Paroxysmal atrial fibrillation  - TEE cardioversion 07/04/16  Huston Foley noted 50bpm previously. Cut back metoprolol to 50.   - Amiodarone 200 daily  - Prior left atrial appendage clipping  - Stopped eliquis in anticipation of surgery. (last dose 10AM 5/22).  - On hep IV  - NSR today, no change  Acute diastolic heart failure  - previously markedly  volume overloaded.  - Stopped IV lasix drip and giving lasix 40 QD.   - Total net out -11 L  - Creatinine increased today from 1.16 to 1.21.   - Weight was 173 on 06/21/16-currently 180.  - Doing well on oral lasix. No Change  Hypokalemia  - replete (3.3 today)    CAD post CABG  - 05/30/16, Dr. Dorris Fetch, LAA clipping, wound excellent.  Anemia  - Hemoglobin 7.4-8.2, status post 2 units on 06/28/16  - Now 10.6 ? spurious  - Continue to monitor  - may require additional tx, for now stable  Vertebral septic arthritis  - Both infectious disease and neurosurgery notes reviewed.  -  Antibiotics  - Dr. Wynetta Emery note reviewed. IR obtained samples yesterday. ID note reviewed.   Delirium  - better with son in room yesterday. Now seems clear.   Things have been stable from cardiology perspective. Resume Eliquis when able from surgical perspective Continue oral lasix and metoprolol, amiodarone at current dose.   Will sign off. Please call if any ?  Signed, Donato Schultz, MD  07/13/2016, 8:41 AM
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
## 2016-07-13 NOTE — Progress Notes (Addendum)
[facility]    Date of Admission:  06/21/2016   Total days of antibiotics 23        Day 7 cefazolin           ID: Erin [name] is a 71 y.o. female with complicated history of spinal stenosis s/p laminectomy  On 3/7 c/b fever, worsening back pain found to have sepsis due to ecoli bacteremia and epidural abscess s/p I x D of lumbar wound and evacuation of lumbar epidural abscess on 5/8. OR cx are + pan sensitive ecoli.  Principal Problem:   Sepsis (HCC) Active Problems:   Spinal stenosis at L4-L5 level   Dyslipidemia   Acute midline low back pain without sciatica   Hypokalemia   CAD in native artery   Hx of CABG   AKI (acute kidney injury) (HCC)   Hyponatremia   Persistent atrial fibrillation (HCC)   Spinal abscess (HCC)   Acute on chronic diastolic (congestive) heart failure (HCC)   Atrial flutter (HCC)   Normocytic anemia   Paroxysmal atrial fibrillation (HCC)    Subjective: Still having back pain, less confusion from yesterday. No fevers. Underwent fluoro guided aspiration of l4-l5 disc space  Medications:  . amiodarone  200 mg Oral Daily  . aspirin EC  81 mg Oral Daily  . atorvastatin  80 mg Oral q1800  . cholecalciferol  2,000 Units Oral QPC breakfast  . cycloSPORINE  1 drop Both Eyes BID  . diltiazem  120 mg Oral Daily  . furosemide  40 mg Oral Daily  . levothyroxine  112 mcg Oral QAC breakfast  . mouth rinse  15 mL Mouth Rinse BID  . metoprolol succinate  50 mg Oral Daily  . multivitamin with minerals  1 tablet Oral Daily  . mupirocin ointment   Topical BID  . pantoprazole  40 mg Oral QHS  . polyethylene glycol  17 g Oral BID  . potassium chloride  40 mEq Oral BID  . senna-docusate  2 tablet Oral BID  . sodium chloride flush  3 mL Intravenous Q12H  . sodium chloride flush  3 mL Intravenous Q12H    Objective: Vital signs in last 24 hours: Temp:  [98.1 F (36.7 C)-98.7 F (37.1 C)] 98.4 F (36.9 C) (05/25 0435) Pulse Rate:   [66-74] 66 (05/25 0435) Resp:  [18-20] 18 (05/25 0435) BP: (106-138)/(47-59) 121/47 (05/25 0435) SpO2:  [91 %-100 %] 100 % (05/25 1037) Weight:  [180 lb 1.9 oz (81.7 kg)] 180 lb 1.9 oz (81.7 kg) (05/25 0600) Physical Exam  Constitutional:  oriented to person, place, and time. He appears well-developed and well-nourished. No distress, laying on her side.  HENT:  Mouth/Throat: Oropharynx is clear and moist. No oropharyngeal exudate.  Cardiovascular: Normal rate, regular rhythm and normal heart sounds. Exam reveals no gallop and no friction rub.  No murmur heard.  Pulmonary/Chest: Effort normal and breath sounds normal. No respiratory distress. He has no wheezes.  Abdominal: Soft. Bowel sounds are normal. He exhibits no distension. There is no tenderness.  Skin: Skin is warm and dry. No rash noted. No erythema.  Psychiatric: He has a normal mood and affect. His behavior is normal.    Lab Results  Recent Labs  07/12/16 0729 07/13/16 0610  WBC 7.8 5.4  HGB 7.9* 10.6*  HCT 25.3* 33.6*  NA 129* 130*  K 3.6 3.3*  CL 80* 80*  CO2 41* 41*  BUN 11 10  CREATININE 1.18* 1.17*  Liver Panel  Recent Labs  07/12/16 0729 07/13/16 0610  ALBUMIN 1.9* 2.0*   Lab Results  Component Value Date   ESRSEDRATE 114 (H) 07/10/2016   Lab Results  Component Value Date   CRP 16.4 (H) 07/10/2016    Microbiology: 5/24 aspirate = WBC on gram stain Studies/Results: Ir Fluoro Guide Ndl Plmt / Bx  Result Date: 07/12/2016 INDICATION: Instrumented lumbar fusion, with postop infection. Recent MR suggest progressive discitis L3-4. EXAM: FLUOROSCOPIC GUIDED LUMBAR DISC ASPIRATION MEDICATIONS: None. ANESTHESIA/SEDATION: Intravenous Fentanyl and Versed were administered as conscious sedation during continuous monitoring of the patient's level of consciousness and physiological / cardiorespiratory status by the radiology RN, with a total moderate sedation time of 10 minutes. PROCEDURE: Informed written  consent was obtained from the patient after a thorough discussion of the procedural risks, benefits and alternatives. All questions were addressed. Maximal Sterile Barrier Technique was utilized including caps, mask, sterile gowns, sterile gloves, sterile drape, hand hygiene and skin antiseptic. A timeout was performed prior to the initiation of the procedure. Under fluoroscopic guidance, a 16 gauge trocar needle was advanced into the central third of the L3-4 interspace from a right parasagittal approach, needle position confirmed on AP and lateral fluoroscopy. Approximately 5 mL of thin blood-tinged purulent fluid were aspirated, sent for the requested laboratory studies. The patient tolerated the procedure well. FLUOROSCOPY TIME:  30 seconds, 13 mGy COMPLICATIONS: None immediate. IMPRESSION: 1. Technically successful fluoroscopic guided lumbar L3-4 disc aspiration. Sample sent for Gram stain and culture Electronically Signed   By: Corlis Leak M.D.   On: 07/12/2016 13:46     Assessment/Plan: E.coli discitis/epidural abscess = continue on cefazolin for now. Appears to be responding from peripheral WBC standpoint and no longer having fevers. Await for culture results from yesterday's aspirate to decide to change abtx regimen. Patient is slated for revision/fusion to stabilize the affected area.  Back pain = recommend to try lidocaine patch or voltaren cream to do non-opiate remedies.Affected vertebrae are likely weakened from the e.coli infection causing the instability-> possibly nerve impingement.  Dr Zenaida Niece dam to see over the weekend  [facility] Cell: [phone] Pager: [phone]  07/13/2016, 12:53 PM

## 2016-07-13 NOTE — Care Management Note (Signed)
Case Management Note Donn Pierini RN, BSN Unit 2W-Case Manager [phone]  Patient Details  Name: Erin [name] MRN: [id number] Date of Birth: 1945/05/12  Subjective/Objective:   Pt admitted with sepsis, AKI, plan for OR today 06/26/16                 Action/Plan: PTA pt was from home with son, recently discharged from SNF-rehab stay- uses RW. - CM to follow for d/c needs post op - per Essie Hart. RNCM- pt made HRI with [facility] if pt  to go home with [facility] services- per [facility] mtg on 06/26/16   Expected Discharge Date:                  Expected Discharge Plan:  Skilled Nursing Facility  In-House Referral:  Clinical Social Work  Discharge planning Services  CM Consult  Post Acute Care Choice:    Choice offered to:     DME Arranged:    DME Agency:     HH Arranged:    HH Agency:     Status of Service:  In process, will continue to follow  If discussed at Long Length of Stay Meetings, dates discussed:  5/22, 5/24  Discharge Disposition:   Additional Comments:  07/13/16- 1200- Donn Pierini RN, CM- pt's stay has been complicated by afib and vol overload- s/p cardioversion. Pt had IR aspiration done 5/24 for further cultures- plan is for surgical stabilization next week ?5/30 per Dr. Wynetta Emery. CSW continues to follow for SNF when medically stable.  06/27/16- 1140- Azrael Maddix rN, CM- pt s/p reexploration of the lumbar wound for irrigation and debridement and evacuation of lumbar epidural abscess on 06/26/16- on 2H post op with order to tx today-  PT/OT evals pending- CM will follow for recommendations and d/c needs.   Darrold Span, RN 07/13/2016, 12:09 PM

## 2016-07-13 NOTE — Progress Notes (Signed)
ANTICOAGULATION CONSULT NOTE   Pharmacy Consult for Heparin Indication: atrial fibrillation  Allergies  Allergen Reactions  . Ace Inhibitors Swelling    ACE stopped after pt seen in ED with facial swelling- allergy testing pending  . Morphine And Related   . Codeine Nausea And Vomiting    Patient Measurements: Height: 5\' 3"  (160 cm) Weight: 180 lb 1.9 oz (81.7 kg) IBW/kg (Calculated) : 52.4  Vital Signs: Temp: 98.4 F (36.9 C) (05/25 0435) Temp Source: Oral (05/25 0435) BP: 121/47 (05/25 0435) Pulse Rate: 66 (05/25 0435)  Labs:  Recent Labs  07/10/16 1536  07/11/16 0630 07/11/16 1549 07/12/16 0729 07/13/16 0610  HGB  --   < > 8.1*  --  7.9* 10.6*  HCT  --   --  25.4*  --  25.3* 33.6*  PLT  --   --  156  --  172 159  APTT 46*  --  109* 101* 92* 93*  LABPROT 22.6*  --   --   --  18.4*  --   INR 1.96  --   --   --  1.51  --   HEPARINUNFRC >2.20*  --  >2.20*  --  >2.20* >2.20*  CREATININE  --   --  1.21*  --  1.18* 1.17*  < > = values in this interval not displayed.  Estimated Creatinine Clearance: 44 mL/min (A) (by C-G formula based on SCr of 1.17 mg/dL (H)).   Medications:  Scheduled:  . amiodarone  200 mg Oral Daily  . aspirin EC  81 mg Oral Daily  . atorvastatin  80 mg Oral q1800  . cholecalciferol  2,000 Units Oral QPC breakfast  . cycloSPORINE  1 drop Both Eyes BID  . diltiazem  120 mg Oral Daily  . furosemide  40 mg Oral Daily  . levothyroxine  112 mcg Oral QAC breakfast  . mouth rinse  15 mL Mouth Rinse BID  . metoprolol succinate  50 mg Oral Daily  . multivitamin with minerals  1 tablet Oral Daily  . mupirocin ointment   Topical BID  . pantoprazole  40 mg Oral QHS  . polyethylene glycol  17 g Oral BID  . potassium chloride  40 mEq Oral BID  . senna-docusate  2 tablet Oral BID  . sodium chloride flush  3 mL Intravenous Q12H  . sodium chloride flush  3 mL Intravenous Q12H    Assessment: 70 yof on apixaban PTA for afib, apixaban is on hold  peri-operatively for lumbar revision surgery- pt is being bridged with heparin.  Last dose of Eliquis was 5/22. Heparin level reflecting ongoing Apixaban effect, aPTT within goal. hgb up to 10.6, ptls wnl, SCr 1.1.   Goal of Therapy:  APTT 66-102 sec Heparin level 0.3-0.7 units/ml Monitor platelets by anticoagulation protocol: Yes    Plan:  -Continue heparin at 900 units/hr -Daily heparin level, aPTT  -Check confirmatory aPTT this afternoon    Agapito Games, PharmD, BCPS Clinical Pharmacist 07/13/2016 8:27 AM
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
## 2016-07-13 NOTE — Progress Notes (Signed)
Patient ID: Erin [name], female   DOB: 06/13/1945, 71 y.o.   MRN: [id number] Murle looks lot better this morning less back pain improved lower extremity symptoms    Strength 5/5 wound clean dry and intact   continue very mild activity bedrest out of bed only in the brace continue to await cultures white count seems to be improved.  We will plan surgical stabilization next week probably Wednesday.  Hopefully when cultures come back we can broaden her antibiotic coverage leading in the surgery.

## 2016-07-14 LAB — RENAL FUNCTION PANEL
Albumin: 1.9 g/dL — ABNORMAL LOW (ref 3.5–5.0)
Anion gap: 8 (ref 5–15)
BUN: 8 mg/dL (ref 6–20)
CO2: 39 mmol/L — ABNORMAL HIGH (ref 22–32)
Calcium: 8.7 mg/dL — ABNORMAL LOW (ref 8.9–10.3)
Chloride: 81 mmol/L — ABNORMAL LOW (ref 101–111)
Creatinine, Ser: 1.05 mg/dL — ABNORMAL HIGH (ref 0.44–1.00)
GFR calc Af Amer: 60 mL/min — ABNORMAL LOW (ref >60)
GFR calc non Af Amer: 52 mL/min — ABNORMAL LOW (ref >60)
Glucose, Bld: 109 mg/dL — ABNORMAL HIGH (ref 65–99)
Phosphorus: 3.1 mg/dL (ref 2.5–4.6)
Potassium: 3.8 mmol/L (ref 3.5–5.1)
Sodium: 128 mmol/L — ABNORMAL LOW (ref 135–145)

## 2016-07-14 LAB — CBC
HCT: 23.6 % — ABNORMAL LOW (ref 36.0–46.0)
Hemoglobin: 7.4 g/dL — ABNORMAL LOW (ref 12.0–15.0)
MCH: 26.5 pg (ref 26.0–34.0)
MCHC: 31.4 g/dL (ref 30.0–36.0)
MCV: 84.6 fL (ref 78.0–100.0)
Platelets: 183 10*3/uL (ref 150–400)
RBC: 2.79 MIL/uL — ABNORMAL LOW (ref 3.87–5.11)
RDW: 17.6 % — ABNORMAL HIGH (ref 11.5–15.5)
WBC: 6.8 10*3/uL (ref 4.0–10.5)

## 2016-07-14 LAB — HEPARIN LEVEL (UNFRACTIONATED): Heparin Unfractionated: 1.53 IU/mL — ABNORMAL HIGH (ref 0.30–0.70)

## 2016-07-14 LAB — APTT: aPTT: 68 seconds — ABNORMAL HIGH (ref 24–36)

## 2016-07-14 LAB — MAGNESIUM: Magnesium: 1.6 mg/dL — ABNORMAL LOW (ref 1.7–2.4)

## 2016-07-14 NOTE — Progress Notes (Signed)
         Date: 07/14/2016  Patient name: Erin [name]  Medical record number: [id number]  Date of birth: 04-10-1945    Cultures from  Aspirate are NG x 24 hours  Continue Cefazolin   Paulette Blanch Dam 07/14/2016, 10:55 AM

## 2016-07-14 NOTE — Progress Notes (Signed)
Family called concerned about swelling noted at back when patient got up to bedside commode. Assessed and appeared slightly swollen as during morning assessment.  Assisted to bedside commode later and noted more swelling at site and appeared somewhat deformed. Patient complaining of pain and taking medication for first time today.  Called Dr. Cena Benton and updated.  Called Dr. Newell Coral to make aware. Pt resting with call bell within reach.  Will continue to monitor. Daughter at bedside. Thomas Hoff, RN
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
## 2016-07-14 NOTE — Progress Notes (Signed)
ANTICOAGULATION CONSULT NOTE   Pharmacy Consult for Heparin Indication: atrial fibrillation  Allergies  Allergen Reactions  . Ace Inhibitors Swelling    ACE stopped after pt seen in ED with facial swelling- allergy testing pending  . Morphine And Related   . Codeine Nausea And Vomiting    Patient Measurements: Height: 5\' 3"  (160 cm) Weight: 180 lb 5.4 oz (81.8 kg) IBW/kg (Calculated) : 52.4  Vital Signs: Temp: 98.6 F (37 C) (05/26 0400) Temp Source: Oral (05/26 0400) BP: 117/68 (05/26 0400) Pulse Rate: 67 (05/26 0400)  Labs:  Recent Labs  07/12/16 0729 07/13/16 0610 07/13/16 1603 07/14/16 0427  HGB 7.9* 10.6*  --  7.4*  HCT 25.3* 33.6*  --  23.6*  PLT 172 159  --  183  APTT 92* 93* 83* 68*  LABPROT 18.4*  --   --   --   INR 1.51  --   --   --   HEPARINUNFRC >2.20* >2.20*  --  1.53*  CREATININE 1.18* 1.17*  --  1.05*    Estimated Creatinine Clearance: 49.1 mL/min (A) (by C-G formula based on SCr of 1.05 mg/dL (H)).   Medications:  Scheduled:  . amiodarone  200 mg Oral Daily  . aspirin EC  81 mg Oral Daily  . atorvastatin  80 mg Oral q1800  . cholecalciferol  2,000 Units Oral QPC breakfast  . cycloSPORINE  1 drop Both Eyes BID  . diltiazem  120 mg Oral Daily  . furosemide  40 mg Oral Daily  . levothyroxine  112 mcg Oral QAC breakfast  . mouth rinse  15 mL Mouth Rinse BID  . metoprolol succinate  50 mg Oral Daily  . multivitamin with minerals  1 tablet Oral Daily  . mupirocin ointment   Topical BID  . pantoprazole  40 mg Oral QHS  . polyethylene glycol  17 g Oral BID  . potassium chloride  40 mEq Oral BID  . senna-docusate  2 tablet Oral BID  . sodium chloride flush  3 mL Intravenous Q12H  . sodium chloride flush  3 mL Intravenous Q12H    Assessment: 71 year old female on apixaban PTA for afib, apixaban has been on hold peri-operatively for lumbar revision surgery- pt is being bridged with heparin.  Last dose of Eliquis was 5/22. Heparin level  reflecting ongoing Apixaban effect, aPTT within therapeutic range but is trending down and is on low end of range. hgb 7.4, plts wnl.   Goal of Therapy:  APTT 66-102 sec Heparin level 0.3-0.7 units/ml Monitor platelets by anticoagulation protocol: Yes    Plan:  -Increase heparin to 950 units/hr -Daily heparin level, aPTT  -Watch drop in hgb     Agapito Games, PharmD, BCPS Clinical Pharmacist 07/14/2016 10:33 AM

## 2016-07-14 NOTE — Progress Notes (Signed)
Subjective: Patient with moderate confusion, but appears comfortable.  Objective: Vital signs in last 24 hours: Vitals:   07/13/16 1512 07/13/16 2046 07/14/16 0400 07/14/16 0500  BP: (!) 121/56 124/63 117/68   Pulse: 61 (!) 59 67   Resp: 18 18 16    Temp: 98.6 F (37 C) 98.5 F (36.9 C) 98.6 F (37 C)   TempSrc: Oral Oral Oral   SpO2: 100% 100% 99%   Weight:    81.8 kg (180 lb 5.4 oz)  Height:        Intake/Output from previous day: 05/25 0701 - 05/26 0700 In: 1300 [P.O.:240; I.V.:460; IV Piggyback:600] Out: -  Intake/Output this shift: No intake/output data recorded.  Physical Exam:  Awake alert, oriented to name, but not day. Moving all 4 extremities.  CBC  Recent Labs  07/13/16 0610 07/14/16 0427  WBC 5.4 6.8  HGB 10.6* 7.4*  HCT 33.6* 23.6*  PLT 159 183   BMET  Recent Labs  07/13/16 0610 07/14/16 0427  NA 130* 128*  K 3.3* 3.8  CL 80* 81*  CO2 41* 39*  GLUCOSE 104* 109*  BUN 10 8  CREATININE 1.17* 1.05*  CALCIUM 8.9 8.7*    Studies/Results: Ir Fluoro Guide Ndl Plmt / Bx  Result Date: 07/12/2016 INDICATION: Instrumented lumbar fusion, with postop infection. Recent MR suggest progressive discitis L3-4. EXAM: FLUOROSCOPIC GUIDED LUMBAR DISC ASPIRATION MEDICATIONS: None. ANESTHESIA/SEDATION: Intravenous Fentanyl and Versed were administered as conscious sedation during continuous monitoring of the patient's level of consciousness and physiological / cardiorespiratory status by the radiology RN, with a total moderate sedation time of 10 minutes. PROCEDURE: Informed written consent was obtained from the patient after a thorough discussion of the procedural risks, benefits and alternatives. All questions were addressed. Maximal Sterile Barrier Technique was utilized including caps, mask, sterile gowns, sterile gloves, sterile drape, hand hygiene and skin antiseptic. A timeout was performed prior to the initiation of the procedure. Under fluoroscopic guidance, a  16 gauge trocar needle was advanced into the central third of the L3-4 interspace from a right parasagittal approach, needle position confirmed on AP and lateral fluoroscopy. Approximately 5 mL of thin blood-tinged purulent fluid were aspirated, sent for the requested laboratory studies. The patient tolerated the procedure well. FLUOROSCOPY TIME:  30 seconds, 13 mGy COMPLICATIONS: None immediate. IMPRESSION: 1. Technically successful fluoroscopic guided lumbar L3-4 disc aspiration. Sample sent for Gram stain and culture Electronically Signed   By: Corlis Leak M.D.   On: 07/12/2016 13:46    Assessment/Plan: Continues on Ancef.   Hewitt Shorts, MD 07/14/2016, 11:09 AM

## 2016-07-15 LAB — RENAL FUNCTION PANEL
Albumin: 2 g/dL — ABNORMAL LOW (ref 3.5–5.0)
Anion gap: 6 (ref 5–15)
BUN: 7 mg/dL (ref 6–20)
CO2: 39 mmol/L — ABNORMAL HIGH (ref 22–32)
Calcium: 8.6 mg/dL — ABNORMAL LOW (ref 8.9–10.3)
Chloride: 82 mmol/L — ABNORMAL LOW (ref 101–111)
Creatinine, Ser: 1.1 mg/dL — ABNORMAL HIGH (ref 0.44–1.00)
GFR calc Af Amer: 57 mL/min — ABNORMAL LOW (ref >60)
GFR calc non Af Amer: 49 mL/min — ABNORMAL LOW (ref >60)
Glucose, Bld: 110 mg/dL — ABNORMAL HIGH (ref 65–99)
Phosphorus: 3 mg/dL (ref 2.5–4.6)
Potassium: 3.8 mmol/L (ref 3.5–5.1)
Sodium: 127 mmol/L — ABNORMAL LOW (ref 135–145)

## 2016-07-15 LAB — BASIC METABOLIC PANEL
Anion gap: 8 (ref 5–15)
BUN: 7 mg/dL (ref 6–20)
CO2: 39 mmol/L — ABNORMAL HIGH (ref 22–32)
Calcium: 8.6 mg/dL — ABNORMAL LOW (ref 8.9–10.3)
Chloride: 81 mmol/L — ABNORMAL LOW (ref 101–111)
Creatinine, Ser: 1.07 mg/dL — ABNORMAL HIGH (ref 0.44–1.00)
GFR calc Af Amer: 59 mL/min — ABNORMAL LOW (ref >60)
GFR calc non Af Amer: 51 mL/min — ABNORMAL LOW (ref >60)
Glucose, Bld: 109 mg/dL — ABNORMAL HIGH (ref 65–99)
Potassium: 3.7 mmol/L (ref 3.5–5.1)
Sodium: 128 mmol/L — ABNORMAL LOW (ref 135–145)

## 2016-07-15 LAB — CBC
HCT: 28 % — ABNORMAL LOW (ref 36.0–46.0)
Hemoglobin: 8.6 g/dL — ABNORMAL LOW (ref 12.0–15.0)
MCH: 26 pg (ref 26.0–34.0)
MCHC: 30.7 g/dL (ref 30.0–36.0)
MCV: 84.6 fL (ref 78.0–100.0)
Platelets: 162 10*3/uL (ref 150–400)
RBC: 3.31 MIL/uL — ABNORMAL LOW (ref 3.87–5.11)
RDW: 17.9 % — ABNORMAL HIGH (ref 11.5–15.5)
WBC: 5.8 10*3/uL (ref 4.0–10.5)

## 2016-07-15 LAB — MAGNESIUM: Magnesium: 1.5 mg/dL — ABNORMAL LOW (ref 1.7–2.4)

## 2016-07-15 LAB — HEPARIN LEVEL (UNFRACTIONATED)
Heparin Unfractionated: 1.2 IU/mL — ABNORMAL HIGH (ref 0.30–0.70)
Heparin Unfractionated: 1.31 IU/mL — ABNORMAL HIGH (ref 0.30–0.70)

## 2016-07-15 LAB — APTT
aPTT: 125 seconds — ABNORMAL HIGH (ref 24–36)
aPTT: >200 seconds (ref 24–36)
aPTT: 103 seconds — ABNORMAL HIGH (ref 24–36)

## 2016-07-15 MED ORDER — MAGNESIUM SULFATE 2 GM/50ML IV SOLN
2.0000 g | Freq: Once | INTRAVENOUS | Status: AC
  Administered 2016-07-15: 2 g via INTRAVENOUS
  Filled 2016-07-15: qty 50

## 2016-07-15 NOTE — Progress Notes (Signed)
ANTICOAGULATION CONSULT NOTE   Pharmacy Consult for Heparin Indication: atrial fibrillation  Allergies  Allergen Reactions  . Ace Inhibitors Swelling    ACE stopped after pt seen in ED with facial swelling- allergy testing pending  . Ambien [Zolpidem] Other (See Comments)    confusion  . Morphine And Related   . Codeine Nausea And Vomiting    Patient Measurements: Height: 5\' 3"  (160 cm) Weight: 180 lb 1.9 oz (81.7 kg) IBW/kg (Calculated) : 52.4  Vital Signs: Temp: 97.5 F (36.4 C) (05/27 0623) Temp Source: Oral (05/27 0623) BP: 110/50 (05/27 0623) Pulse Rate: 63 (05/27 0623)  Labs:  Recent Labs  07/12/16 0729 07/13/16 0610 07/13/16 1603 07/14/16 0427 07/15/16 0426  HGB 7.9* 10.6*  --  7.4* 8.6*  HCT 25.3* 33.6*  --  23.6* 28.0*  PLT 172 159  --  183 162  APTT 92* 93* 83* 68* 125*  LABPROT 18.4*  --   --   --   --   INR 1.51  --   --   --   --   HEPARINUNFRC >2.20* >2.20*  --  1.53* 1.20*  CREATININE 1.18* 1.17*  --  1.05* 1.10*  1.07*    Estimated Creatinine Clearance: 46.8 mL/min (A) (by C-G formula based on SCr of 1.1 mg/dL (H)).   Medications:  Scheduled:  . amiodarone  200 mg Oral Daily  . aspirin EC  81 mg Oral Daily  . atorvastatin  80 mg Oral q1800  . cholecalciferol  2,000 Units Oral QPC breakfast  . cycloSPORINE  1 drop Both Eyes BID  . diltiazem  120 mg Oral Daily  . furosemide  40 mg Oral Daily  . levothyroxine  112 mcg Oral QAC breakfast  . mouth rinse  15 mL Mouth Rinse BID  . metoprolol succinate  50 mg Oral Daily  . multivitamin with minerals  1 tablet Oral Daily  . mupirocin ointment   Topical BID  . pantoprazole  40 mg Oral QHS  . polyethylene glycol  17 g Oral BID  . potassium chloride  40 mEq Oral BID  . senna-docusate  2 tablet Oral BID  . sodium chloride flush  3 mL Intravenous Q12H  . sodium chloride flush  3 mL Intravenous Q12H    Assessment: 71 year old female on apixaban PTA for afib, apixaban has been on hold  peri-operatively for lumbar revision surgery- pt is being bridged with heparin.  Last dose of Eliquis was 5/22. Heparin level reflecting ongoing Apixaban effect, aPTT is higher than desired this am at 125. Hgb 8.6, PLTc 162.   Goal of Therapy:  APTT 66-102 sec Heparin level 0.3-0.7 units/ml Monitor platelets by anticoagulation protocol: Yes    Plan:  -Decrease heparin infusion 850 units/hr -Repeat heparin level and aPTT in 8 hours   Pollyann Samples, PharmD, BCPS 07/15/2016, 6:32 AM
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
## 2016-07-15 NOTE — Progress Notes (Signed)
ANTICOAGULATION CONSULT NOTE   Pharmacy Consult for Heparin Indication: atrial fibrillation  Allergies  Allergen Reactions  . Ace Inhibitors Swelling    ACE stopped after pt seen in ED with facial swelling- allergy testing pending  . Ambien [Zolpidem] Other (See Comments)    confusion  . Morphine And Related   . Codeine Nausea And Vomiting    Patient Measurements: Height: 5\' 3"  (160 cm) Weight: 180 lb 1.9 oz (81.7 kg) IBW/kg (Calculated) : 52.4  Vital Signs: Temp: 98.7 F (37.1 C) (05/27 1201) Temp Source: Oral (05/27 1201) BP: 119/63 (05/27 1201) Pulse Rate: 66 (05/27 1201)  Labs:  Recent Labs  07/13/16 0610  07/14/16 0427 07/15/16 0426 07/15/16 1820 07/15/16 2000  HGB 10.6*  --  7.4* 8.6*  --   --   HCT 33.6*  --  23.6* 28.0*  --   --   PLT 159  --  183 162  --   --   APTT 93*  < > 68* 125* >200* 103*  HEPARINUNFRC >2.20*  --  1.53* 1.20* 1.31*  --   CREATININE 1.17*  --  1.05* 1.10*  1.07*  --   --   < > = values in this interval not displayed.  Estimated Creatinine Clearance: 46.8 mL/min (A) (by C-G formula based on SCr of 1.1 mg/dL (H)).   Medications:  Scheduled:  . amiodarone  200 mg Oral Daily  . aspirin EC  81 mg Oral Daily  . atorvastatin  80 mg Oral q1800  . cholecalciferol  2,000 Units Oral QPC breakfast  . cycloSPORINE  1 drop Both Eyes BID  . diltiazem  120 mg Oral Daily  . furosemide  40 mg Oral Daily  . levothyroxine  112 mcg Oral QAC breakfast  . mouth rinse  15 mL Mouth Rinse BID  . metoprolol succinate  50 mg Oral Daily  . multivitamin with minerals  1 tablet Oral Daily  . mupirocin ointment   Topical BID  . pantoprazole  40 mg Oral QHS  . polyethylene glycol  17 g Oral BID  . potassium chloride  40 mEq Oral BID  . senna-docusate  2 tablet Oral BID  . sodium chloride flush  3 mL Intravenous Q12H  . sodium chloride flush  3 mL Intravenous Q12H    Assessment: 71 year old female on apixaban PTA for afib, apixaban has been on hold  peri-operatively for lumbar revision surgery- pt is being bridged with heparin.  Last dose of Eliquis was 5/22.   The initial HL and aPTT from this evening were drawn incorrectly, both resulting as supratherapeutic levels > these should be ignored.   Repeat aPTT is slightly supratherapeutic, though difficult to interpret. aPTTs have been fluctuating, given she was almost subtherapeutic previously on 900 units/hr, will leave current rate as is.   Goal of Therapy:  APTT 66-102 sec Heparin level 0.3-0.7 units/ml Monitor platelets by anticoagulation protocol: Yes    Plan:  -Continue heparin at 850 units/hr -Daily heparin level, aPTT      Agapito Games, PharmD, BCPS Clinical Pharmacist 07/15/2016 8:56 PM

## 2016-07-16 LAB — CBC
HCT: 24.5 % — ABNORMAL LOW (ref 36.0–46.0)
Hemoglobin: 7.6 g/dL — ABNORMAL LOW (ref 12.0–15.0)
MCH: 26.4 pg (ref 26.0–34.0)
MCHC: 31 g/dL (ref 30.0–36.0)
MCV: 85.1 fL (ref 78.0–100.0)
Platelets: 174 10*3/uL (ref 150–400)
RBC: 2.88 MIL/uL — ABNORMAL LOW (ref 3.87–5.11)
RDW: 18 % — ABNORMAL HIGH (ref 11.5–15.5)
WBC: 7.3 10*3/uL (ref 4.0–10.5)

## 2016-07-16 LAB — RENAL FUNCTION PANEL
Albumin: 2 g/dL — ABNORMAL LOW (ref 3.5–5.0)
Anion gap: 9 (ref 5–15)
BUN: 6 mg/dL (ref 6–20)
CO2: 36 mmol/L — ABNORMAL HIGH (ref 22–32)
Calcium: 8.7 mg/dL — ABNORMAL LOW (ref 8.9–10.3)
Chloride: 86 mmol/L — ABNORMAL LOW (ref 101–111)
Creatinine, Ser: 1.13 mg/dL — ABNORMAL HIGH (ref 0.44–1.00)
GFR calc Af Amer: 55 mL/min — ABNORMAL LOW (ref >60)
GFR calc non Af Amer: 47 mL/min — ABNORMAL LOW (ref >60)
Glucose, Bld: 97 mg/dL (ref 65–99)
Phosphorus: 3.1 mg/dL (ref 2.5–4.6)
Potassium: 4.5 mmol/L (ref 3.5–5.1)
Sodium: 131 mmol/L — ABNORMAL LOW (ref 135–145)

## 2016-07-16 LAB — APTT: aPTT: 103 seconds — ABNORMAL HIGH (ref 24–36)

## 2016-07-16 LAB — HEPARIN LEVEL (UNFRACTIONATED): Heparin Unfractionated: 0.98 IU/mL — ABNORMAL HIGH (ref 0.30–0.70)

## 2016-07-16 LAB — MAGNESIUM: Magnesium: 1.8 mg/dL (ref 1.7–2.4)

## 2016-07-16 MED ORDER — LIDOCAINE 5 % EX PTCH
1.0000 | MEDICATED_PATCH | CUTANEOUS | Status: DC
  Administered 2016-07-16 – 2016-07-29 (×9): 1 via TRANSDERMAL
  Filled 2016-07-16 (×10): qty 1

## 2016-07-16 NOTE — Progress Notes (Signed)
ANTICOAGULATION CONSULT NOTE   Pharmacy Consult for Heparin Indication: atrial fibrillation  Allergies  Allergen Reactions  . Ace Inhibitors Swelling    ACE stopped after pt seen in ED with facial swelling- allergy testing pending  . Ambien [Zolpidem] Other (See Comments)    confusion  . Morphine And Related   . Codeine Nausea And Vomiting    Patient Measurements: Height: 5\' 3"  (160 cm) Weight: 171 lb 1.2 oz (77.6 kg) IBW/kg (Calculated) : 52.4  Vital Signs: Temp: 98.7 F (37.1 C) (05/28 0514) Temp Source: Oral (05/28 0514) BP: 134/60 (05/28 0514) Pulse Rate: 64 (05/28 0514)  Labs:  Recent Labs  07/14/16 0427 07/15/16 0426 07/15/16 1820 07/15/16 2000 07/16/16 0435  HGB 7.4* 8.6*  --   --  7.6*  HCT 23.6* 28.0*  --   --  24.5*  PLT 183 162  --   --  174  APTT 68* 125* >200* 103* 103*  HEPARINUNFRC 1.53* 1.20* 1.31*  --  0.98*  CREATININE 1.05* 1.10*  1.07*  --   --  1.13*    Estimated Creatinine Clearance: 44.4 mL/min (A) (by C-G formula based on SCr of 1.13 mg/dL (H)).   Medications:  Scheduled:  . amiodarone  200 mg Oral Daily  . aspirin EC  81 mg Oral Daily  . atorvastatin  80 mg Oral q1800  . cholecalciferol  2,000 Units Oral QPC breakfast  . cycloSPORINE  1 drop Both Eyes BID  . diltiazem  120 mg Oral Daily  . furosemide  40 mg Oral Daily  . levothyroxine  112 mcg Oral QAC breakfast  . mouth rinse  15 mL Mouth Rinse BID  . metoprolol succinate  50 mg Oral Daily  . multivitamin with minerals  1 tablet Oral Daily  . mupirocin ointment   Topical BID  . pantoprazole  40 mg Oral QHS  . polyethylene glycol  17 g Oral BID  . potassium chloride  40 mEq Oral BID  . senna-docusate  2 tablet Oral BID  . sodium chloride flush  3 mL Intravenous Q12H  . sodium chloride flush  3 mL Intravenous Q12H    Assessment: 71 year old female on apixaban PTA for afib, apixaban has been on hold peri-operatively for lumbar revision surgery- pt is being bridged with  heparin.  Last dose of Eliquis was 5/22.   aPTTs (103) remains slightly above goal this morning on 850 units/hr. hgb low at 7.6, but overall stable, pltc 174 K  Goal of Therapy:  APTT 66-102 sec Heparin level 0.3-0.7 units/ml Monitor platelets by anticoagulation protocol: Yes    Plan:  -Decrease heparin rate to 800 units/hr -Daily heparin level, aPTT   Bayard Hugger, PharmD, BCPS  Clinical Pharmacist  Pager: [phone]   07/16/2016 8:07 AM

## 2016-07-16 NOTE — Progress Notes (Signed)
Clinical Social Worker following patient/family for support and disposition needs.  Marrianne Mood, MSW,  [employer] [phone]
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
## 2016-07-17 ENCOUNTER — Other Ambulatory Visit: Payer: Self-pay | Admitting: Neurosurgery

## 2016-07-17 LAB — RENAL FUNCTION PANEL
Albumin: 1.8 g/dL — ABNORMAL LOW (ref 3.5–5.0)
Anion gap: 8 (ref 5–15)
BUN: 7 mg/dL (ref 6–20)
CO2: 32 mmol/L (ref 22–32)
Calcium: 8.2 mg/dL — ABNORMAL LOW (ref 8.9–10.3)
Chloride: 90 mmol/L — ABNORMAL LOW (ref 101–111)
Creatinine, Ser: 1.06 mg/dL — ABNORMAL HIGH (ref 0.44–1.00)
GFR calc Af Amer: 59 mL/min — ABNORMAL LOW (ref >60)
GFR calc non Af Amer: 51 mL/min — ABNORMAL LOW (ref >60)
Glucose, Bld: 103 mg/dL — ABNORMAL HIGH (ref 65–99)
Phosphorus: 2.8 mg/dL (ref 2.5–4.6)
Potassium: 4.5 mmol/L (ref 3.5–5.1)
Sodium: 130 mmol/L — ABNORMAL LOW (ref 135–145)

## 2016-07-17 LAB — CBC
HCT: 23.1 % — ABNORMAL LOW (ref 36.0–46.0)
Hemoglobin: 7.2 g/dL — ABNORMAL LOW (ref 12.0–15.0)
MCH: 26.6 pg (ref 26.0–34.0)
MCHC: 31.2 g/dL (ref 30.0–36.0)
MCV: 85.2 fL (ref 78.0–100.0)
Platelets: 161 10*3/uL (ref 150–400)
RBC: 2.71 MIL/uL — ABNORMAL LOW (ref 3.87–5.11)
RDW: 18.2 % — ABNORMAL HIGH (ref 11.5–15.5)
WBC: 7.3 10*3/uL (ref 4.0–10.5)

## 2016-07-17 LAB — APTT: aPTT: 88 seconds — ABNORMAL HIGH (ref 24–36)

## 2016-07-17 LAB — AEROBIC/ANAEROBIC CULTURE (SURGICAL/DEEP WOUND): Special Requests: NORMAL

## 2016-07-17 LAB — HEPARIN LEVEL (UNFRACTIONATED): Heparin Unfractionated: 0.67 IU/mL (ref 0.30–0.70)

## 2016-07-17 LAB — MAGNESIUM: Magnesium: 1.4 mg/dL — ABNORMAL LOW (ref 1.7–2.4)

## 2016-07-17 LAB — AEROBIC/ANAEROBIC CULTURE W GRAM STAIN (SURGICAL/DEEP WOUND): Culture: NO GROWTH

## 2016-07-17 MED ORDER — KETOROLAC TROMETHAMINE 15 MG/ML IJ SOLN
15.0000 mg | Freq: Four times a day (QID) | INTRAMUSCULAR | Status: AC | PRN
  Administered 2016-07-17 – 2016-07-22 (×12): 15 mg via INTRAVENOUS
  Filled 2016-07-17 (×14): qty 1

## 2016-07-17 MED ORDER — DEXTROSE 5 % IV SOLN
3.0000 g | Freq: Once | INTRAVENOUS | Status: AC
  Administered 2016-07-17: 3 g via INTRAVENOUS
  Filled 2016-07-17: qty 6

## 2016-07-17 MED ORDER — CEFAZOLIN SODIUM-DEXTROSE 2-4 GM/100ML-% IV SOLN: 2.0000 g | INTRAVENOUS | Status: DC

## 2016-07-17 MED ORDER — CHLORHEXIDINE GLUCONATE CLOTH 2 % EX PADS
6.0000 | MEDICATED_PAD | Freq: Once | CUTANEOUS | Status: AC
  Administered 2016-07-17: 6 via TOPICAL

## 2016-07-17 MED ORDER — CEFAZOLIN SODIUM-DEXTROSE 2-4 GM/100ML-% IV SOLN
2.0000 g | INTRAVENOUS | Status: DC
  Filled 2016-07-17: qty 100

## 2016-07-17 MED ORDER — CHLORHEXIDINE GLUCONATE CLOTH 2 % EX PADS
6.0000 | MEDICATED_PAD | Freq: Once | CUTANEOUS | Status: AC
  Administered 2016-07-18: 6 via TOPICAL
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
## 2016-07-17 NOTE — Progress Notes (Signed)
ANTICOAGULATION CONSULT NOTE   Pharmacy Consult for Heparin Indication: atrial fibrillation  Allergies  Allergen Reactions  . Ace Inhibitors Swelling    ACE stopped after pt seen in ED with facial swelling- allergy testing pending  . Ambien [Zolpidem] Other (See Comments)    confusion  . Morphine And Related   . Codeine Nausea And Vomiting    Patient Measurements: Height: 5\' 3"  (160 cm) Weight: 174 lb 9.7 oz (79.2 kg) IBW/kg (Calculated) : 52.4  Vital Signs: Temp: 98.5 F (36.9 C) (05/29 0300) Temp Source: Oral (05/29 0300) BP: 112/51 (05/29 1007) Pulse Rate: 58 (05/29 0300)  Labs:  Recent Labs  07/15/16 0426 07/15/16 1820 07/15/16 2000 07/16/16 0435 07/17/16 0445 07/17/16 0500  HGB 8.6*  --   --  7.6* 7.2*  --   HCT 28.0*  --   --  24.5* 23.1*  --   PLT 162  --   --  174 161  --   APTT 125* >200* 103* 103* 88*  --   HEPARINUNFRC 1.20* 1.31*  --  0.98* 0.67  --   CREATININE 1.10*  1.07*  --   --  1.13*  --  1.06*    Estimated Creatinine Clearance: 47.8 mL/min (A) (by C-G formula based on SCr of 1.06 mg/dL (H)).   Medications:  Scheduled:  . amiodarone  200 mg Oral Daily  . aspirin EC  81 mg Oral Daily  . atorvastatin  80 mg Oral q1800  . cholecalciferol  2,000 Units Oral QPC breakfast  . cycloSPORINE  1 drop Both Eyes BID  . diltiazem  120 mg Oral Daily  . furosemide  40 mg Oral Daily  . levothyroxine  112 mcg Oral QAC breakfast  . lidocaine  1 patch Transdermal Q24H  . mouth rinse  15 mL Mouth Rinse BID  . metoprolol succinate  50 mg Oral Daily  . multivitamin with minerals  1 tablet Oral Daily  . mupirocin ointment   Topical BID  . pantoprazole  40 mg Oral QHS  . polyethylene glycol  17 g Oral BID  . potassium chloride  40 mEq Oral BID  . senna-docusate  2 tablet Oral BID  . sodium chloride flush  3 mL Intravenous Q12H  . sodium chloride flush  3 mL Intravenous Q12H    Assessment: 71 year old female on apixaban PTA for afib, apixaban has been  on hold peri-operatively for lumbar revision surgery- pt is being bridged with heparin.  Last dose of Eliquis was 5/22.  Per notes, plans  for OR at a later time to stabilize the back -aPTT= 88, heparin level = 0.67   Goal of Therapy:  APTT 66-102 sec Heparin level 0.3-0.7 units/ml Monitor platelets by anticoagulation protocol: Yes    Plan:  -No heparin changes needed -Daily heparin level and CBC -May be able to discontinue aPTTs in am  Harland German, Pharm D 07/17/2016 11:32 AM

## 2016-07-17 NOTE — Progress Notes (Signed)
PHARMACY CONSULT NOTE FOR:  OUTPATIENT  PARENTERAL ANTIBIOTIC THERAPY (OPAT)  Indication: epidural and paravertebral abscesses Regimen: Ancef 2gm IV q8h End date: 7/4//2018  IV antibiotic discharge orders are pended. To discharging provider:  please sign these orders via discharge navigator,  Select New Orders & click on the button choice - Manage This Unsigned Work.     Thank you for allowing pharmacy to be a part of this patient's care.  Benny Lennert 07/17/2016, 11:07 AM

## 2016-07-17 NOTE — Care Management Important Message (Signed)
Important Message  Patient Details  Name: Erin [name] MRN: [id number] Date of Birth: [birth year]   Medicare Important Message Given:  Yes    [name] 07/17/2016, 12:52 PM

## 2016-07-17 NOTE — Progress Notes (Signed)
Patient ID: Erin [name], female   DOB: 06/10/1945, 71 y.o.   MRN: [id number] Patient overall looks okay still with a lot of back pain lower Cordelia Pen strength seems to be stable and intact.  Plan OR tomorrow for extension. Lamina placing screws at L2-L3 removing all loose screws at L4 and tying into her construct from 5-1. I've extensively gone over this procedure with her and her family. I discussed this with the nurse will need to stop her heparin at 8 AM planned procedure around 3 patient will be nothing by mouth. We have also reviewed risks benefits perioperative course alternatives surgery next rotations of outcome and managed and agreed to proceed forward.

## 2016-07-17 NOTE — Progress Notes (Signed)
  [facility]    Date of Admission:  06/21/2016    Total days of antibiotics 27        Day 11 cefazolin   ID: Erin [name] is a 71 y.o. female with complicated history of spinal stenosis s/p laminectomy  On 3/7 c/b fever, worsening back pain found to have sepsis due to ecoli bacteremia and epidural abscess s/p I x D of lumbar wound and evacuation of lumbar epidural abscess on 5/8. OR cx are + pan sensitive ecoli. Aspirated new fluid at L3-L4 space on 5/24.   Principal Problem:   Sepsis (HCC) Active Problems:   Spinal stenosis at L4-L5 level   Dyslipidemia   Acute midline low back pain without sciatica   Hypokalemia   CAD in native artery   Hx of CABG   AKI (acute kidney injury) (HCC)   Hyponatremia   Persistent atrial fibrillation (HCC)   Spinal abscess (HCC)   Acute on chronic diastolic (congestive) heart failure (HCC)   Atrial flutter (HCC)   Normocytic anemia   Paroxysmal atrial fibrillation (HCC)    Subjective: Upset this morning. Frustrated with the pain she continues to have. Reports lidocaine patch does not work. Reports lower leg weakness bilaterally and inability to stand now. Shooting pains down legs at times. Asking when she can have surgery.   Medications:  . amiodarone  200 mg Oral Daily  . aspirin EC  81 mg Oral Daily  . atorvastatin  80 mg Oral q1800  . cholecalciferol  2,000 Units Oral QPC breakfast  . cycloSPORINE  1 drop Both Eyes BID  . diltiazem  120 mg Oral Daily  . furosemide  40 mg Oral Daily  . levothyroxine  112 mcg Oral QAC breakfast  . lidocaine  1 patch Transdermal Q24H  . mouth rinse  15 mL Mouth Rinse BID  . metoprolol succinate  50 mg Oral Daily  . multivitamin with minerals  1 tablet Oral Daily  . mupirocin ointment   Topical BID  . pantoprazole  40 mg Oral QHS  . polyethylene glycol  17 g Oral BID  . potassium chloride  40 mEq Oral BID  . senna-docusate  2 tablet Oral BID  . sodium chloride flush  3 mL  Intravenous Q12H  . sodium chloride flush  3 mL Intravenous Q12H    Objective: Vital signs in last 24 hours: Temp:  [97.7 F (36.5 C)-98.5 F (36.9 C)] 98.5 F (36.9 C) (05/29 0300) Pulse Rate:  [58-66] 58 (05/29 0300) Resp:  [18-20] 18 (05/29 0300) BP: (97-132)/(65-86) 97/86 (05/29 0300) SpO2:  [96 %-99 %] 99 % (05/29 0300) Weight:  [174 lb 9.7 oz (79.2 kg)] 174 lb 9.7 oz (79.2 kg) (05/29 0300)  Constitutional: Oriented to person, place, and time. Appears to be in pain, lying on her side and tearful this morning.  HENT:  Mouth/Throat: Oropharynx is clear and moist. No oropharyngeal exudate. Improved blister on lip.  Cardiovascular: Normal rate, regular rhythm and normal heart sounds. No murmur heard.  Pulmonary/Chest: Effort normal and breath sounds normal. No respiratory distress. He has no wheezes or rales.  Abdominal: Soft. Bowel sounds are normal. No distension. There is no tenderness.  Skin: Skin is warm and dry. No rash noted. No erythema.  Psychiatric: tearful and depressed today.   Lab Results  Recent Labs  07/16/16 0435 07/17/16 0445 07/17/16 0500  WBC 7.3 7.3  --   HGB 7.6* 7.2*  --   HCT   24.5* 23.1*  --   NA 131*  --  130*  K 4.5  --  4.5  CL 86*  --  90*  CO2 36*  --  32  BUN 6  --  7  CREATININE 1.13*  --  1.06*   Liver Panel  Recent Labs  07/16/16 0435 07/17/16 0500  ALBUMIN 2.0* 1.8*    Microbiology: Recent Results (from the past 240 hour(s))  Aerobic/Anaerobic Culture (surgical/deep wound)     Status: None (Preliminary result)   Collection Time: 07/12/16 12:54 PM  Result Value Ref Range Status   Specimen Description BACK  Final   Special Requests Normal  Final   Gram Stain   Final    ABUNDANT WBC PRESENT, PREDOMINANTLY PMN NO ORGANISMS SEEN    Culture NO GROWTH, REINCUBATED  Final   Report Status PENDING  Incomplete    Studies/Results: No results found.   Assessment/Plan: E.coli discitis/epidural abscess =  -Continues to be  afebrile and without leukocytosis on cefazolin -No growth x 5 days from lumbar aspirate on 5/24 --> reincubated  -Would treat for total of 8 weeks with cefazolin from 5/9.   Back pain =  -Affected vertebrae are likely weakened from the e.coli infection causing the instability. Sounds like there is nerve involvement.  -Lidocaine patch in place --> No help -Dr. Cram planning on taking her to OR to stabilize her back. Unscheduled at this time while awaiting final cultures. Surgery date TBD for now  OPAT orders for discharge as listed below: We will follow her after her surgery.   Stephanie Dixon, MSN, NP-C [facility] Cell: [phone] Pager: [phone]  07/17/2016, 8:46 AM  Diagnosis: E Coli Discitis / Epidural Abscess  Culture Result: E Coli (pan sens)  Allergies  Allergen Reactions  . Ace Inhibitors Swelling    ACE stopped after pt seen in ED with facial swelling- allergy testing pending  . Ambien [Zolpidem] Other (See Comments)    confusion  . Morphine And Related   . Codeine Nausea And Vomiting    Discharge antibiotics: Cefazolin   Duration: 8 weeks  End Date: July 4th   PIC Care Per Protocol:  Labs weekly while on IV antibiotics: _x_ CBC with differential __ BMP _x_ CMP _x_ CRP _x_ ESR __ Vancomycin trough  __ Please pull PIC at completion of IV antibiotics _x_ Please leave PIC in place until doctor has seen patient or been notified  Fax weekly labs to [phone]  Clinic Follow Up Appt:  @ [facility] clinic in 4 weeks       

## 2016-07-18 ENCOUNTER — Inpatient Hospital Stay (HOSPITAL_COMMUNITY): Payer: Medicare HMO | Admitting: Certified Registered Nurse Anesthetist

## 2016-07-18 ENCOUNTER — Inpatient Hospital Stay (HOSPITAL_COMMUNITY): Payer: Medicare HMO

## 2016-07-18 ENCOUNTER — Encounter (HOSPITAL_COMMUNITY)
Admission: AD | Disposition: A | Discharge status: Skilled Nursing Facility | Payer: Self-pay | Source: Ambulatory Visit | Attending: Family Medicine

## 2016-07-18 DIAGNOSIS — D649 Anemia, unspecified: Secondary | ICD-10-CM

## 2016-07-18 DIAGNOSIS — R0600 Dyspnea, unspecified: Secondary | ICD-10-CM

## 2016-07-18 LAB — CBC
HCT: 22.7 % — ABNORMAL LOW (ref 36.0–46.0)
Hemoglobin: 7 g/dL — ABNORMAL LOW (ref 12.0–15.0)
MCH: 26.4 pg (ref 26.0–34.0)
MCHC: 30.8 g/dL (ref 30.0–36.0)
MCV: 85.7 fL (ref 78.0–100.0)
Platelets: 164 10*3/uL (ref 150–400)
RBC: 2.65 MIL/uL — ABNORMAL LOW (ref 3.87–5.11)
RDW: 18.5 % — ABNORMAL HIGH (ref 11.5–15.5)
WBC: 7.6 10*3/uL (ref 4.0–10.5)

## 2016-07-18 LAB — RENAL FUNCTION PANEL
Albumin: 1.9 g/dL — ABNORMAL LOW (ref 3.5–5.0)
Anion gap: 5 (ref 5–15)
BUN: 9 mg/dL (ref 6–20)
CO2: 32 mmol/L (ref 22–32)
Calcium: 8.1 mg/dL — ABNORMAL LOW (ref 8.9–10.3)
Chloride: 87 mmol/L — ABNORMAL LOW (ref 101–111)
Creatinine, Ser: 1.16 mg/dL — ABNORMAL HIGH (ref 0.44–1.00)
GFR calc Af Amer: 53 mL/min — ABNORMAL LOW (ref >60)
GFR calc non Af Amer: 46 mL/min — ABNORMAL LOW (ref >60)
Glucose, Bld: 126 mg/dL — ABNORMAL HIGH (ref 65–99)
Phosphorus: 3.3 mg/dL (ref 2.5–4.6)
Potassium: 4.3 mmol/L (ref 3.5–5.1)
Sodium: 124 mmol/L — ABNORMAL LOW (ref 135–145)

## 2016-07-18 LAB — MAGNESIUM: Magnesium: 2.4 mg/dL (ref 1.7–2.4)

## 2016-07-18 LAB — HEPARIN LEVEL (UNFRACTIONATED): Heparin Unfractionated: 0.6 IU/mL (ref 0.30–0.70)

## 2016-07-18 LAB — APTT: aPTT: 97 seconds — ABNORMAL HIGH (ref 24–36)

## 2016-07-18 SURGERY — POSTERIOR LUMBAR FUSION 2 WITH HARDWARE REMOVAL
Anesthesia: General | Site: Spine Lumbar

## 2016-07-18 MED ORDER — BUPIVACAINE LIPOSOME 1.3 % IJ SUSP
20.0000 mL | INTRAMUSCULAR | Status: AC
  Filled 2016-07-18: qty 20

## 2016-07-18 MED ORDER — FENTANYL CITRATE (PF) 250 MCG/5ML IJ SOLN
INTRAMUSCULAR | Status: AC
  Filled 2016-07-18: qty 5

## 2016-07-18 MED ORDER — PHENYLEPHRINE HCL 10 MG/ML IJ SOLN
INTRAVENOUS | Status: DC | PRN
  Administered 2016-07-18: 25 ug/min via INTRAVENOUS

## 2016-07-18 MED ORDER — LIDOCAINE-EPINEPHRINE 1 %-1:100000 IJ SOLN
INTRAMUSCULAR | Status: DC | PRN
  Administered 2016-07-18: 8 mL

## 2016-07-18 MED ORDER — FENTANYL CITRATE (PF) 100 MCG/2ML IJ SOLN
50.0000 ug | Freq: Once | INTRAMUSCULAR | Status: AC
  Administered 2016-07-18: 50 ug via INTRAVENOUS

## 2016-07-18 MED ORDER — LIDOCAINE-EPINEPHRINE 1 %-1:100000 IJ SOLN
INTRAMUSCULAR | Status: AC
  Filled 2016-07-18: qty 1

## 2016-07-18 MED ORDER — FENTANYL CITRATE (PF) 100 MCG/2ML IJ SOLN
INTRAMUSCULAR | Status: AC
  Filled 2016-07-18: qty 2

## 2016-07-18 MED ORDER — FENTANYL CITRATE (PF) 100 MCG/2ML IJ SOLN
INTRAMUSCULAR | Status: AC
  Administered 2016-07-18: 50 ug via INTRAVENOUS
  Filled 2016-07-18: qty 2

## 2016-07-18 MED ORDER — THROMBIN 20000 UNITS EX SOLR
CUTANEOUS | Status: DC | PRN
  Administered 2016-07-18: 23:00:00 via TOPICAL

## 2016-07-18 MED ORDER — PHENYLEPHRINE HCL 10 MG/ML IJ SOLN
INTRAMUSCULAR | Status: AC
  Filled 2016-07-18: qty 1

## 2016-07-18 MED ORDER — LACTATED RINGERS IV SOLN
INTRAVENOUS | Status: DC | PRN
  Administered 2016-07-18 (×2): via INTRAVENOUS

## 2016-07-18 MED ORDER — PHENYLEPHRINE HCL 10 MG/ML IJ SOLN
INTRAMUSCULAR | Status: DC | PRN
  Administered 2016-07-18: 80 ug via INTRAVENOUS

## 2016-07-18 MED ORDER — THROMBIN 20000 UNITS EX SOLR
CUTANEOUS | Status: AC
  Filled 2016-07-18: qty 20000

## 2016-07-18 MED ORDER — PROPOFOL 10 MG/ML IV BOLUS
INTRAVENOUS | Status: DC | PRN
  Administered 2016-07-18: 70 mg via INTRAVENOUS

## 2016-07-18 MED ORDER — PROPOFOL 10 MG/ML IV BOLUS
INTRAVENOUS | Status: AC
  Filled 2016-07-18: qty 20

## 2016-07-18 MED ORDER — EPHEDRINE SULFATE 50 MG/ML IJ SOLN
INTRAMUSCULAR | Status: DC | PRN
  Administered 2016-07-18 (×2): 10 mg via INTRAVENOUS

## 2016-07-18 MED ORDER — ROCURONIUM BROMIDE 100 MG/10ML IV SOLN
INTRAVENOUS | Status: DC | PRN
  Administered 2016-07-18: 20 mg via INTRAVENOUS
  Administered 2016-07-18: 50 mg via INTRAVENOUS

## 2016-07-18 MED ORDER — SODIUM CHLORIDE 0.9 % IV SOLN
INTRAVENOUS | Status: DC | PRN
  Administered 2016-07-18: 23:00:00 via INTRAVENOUS

## 2016-07-18 MED ORDER — CEFAZOLIN SODIUM-DEXTROSE 2-3 GM-% IV SOLR
INTRAVENOUS | Status: DC | PRN
  Administered 2016-07-18: 2 g via INTRAVENOUS

## 2016-07-18 MED ORDER — SODIUM CHLORIDE 0.9 % IV SOLN
Freq: Once | INTRAVENOUS | Status: AC
  Administered 2016-07-22: 22:00:00 via INTRAVENOUS

## 2016-07-18 MED ORDER — FENTANYL CITRATE (PF) 250 MCG/5ML IJ SOLN
INTRAMUSCULAR | Status: DC | PRN
  Administered 2016-07-18 – 2016-07-19 (×4): 50 ug via INTRAVENOUS

## 2016-07-18 MED ORDER — LACTATED RINGERS IV SOLN
INTRAVENOUS | Status: DC
  Administered 2016-07-18 – 2016-07-30 (×2): via INTRAVENOUS

## 2016-07-18 MED ORDER — 0.9 % SODIUM CHLORIDE (POUR BTL) OPTIME
TOPICAL | Status: DC | PRN
  Administered 2016-07-18: 1000 mL

## 2016-07-18 MED ORDER — SODIUM CHLORIDE 0.9 % IR SOLN
Status: DC | PRN
  Administered 2016-07-18: 23:00:00

## 2016-07-18 SURGICAL SUPPLY — 81 items
ADH SKN CLS APL DERMABOND .7 (GAUZE/BANDAGES/DRESSINGS) ×1
APL SKNCLS STERI-STRIP NONHPOA (GAUZE/BANDAGES/DRESSINGS) ×1
BAG DECANTER FOR FLEXI CONT (MISCELLANEOUS) ×3 IMPLANT
BENZOIN TINCTURE PRP APPL 2/3 (GAUZE/BANDAGES/DRESSINGS) ×3 IMPLANT
BLADE SURG 11 STRL SS (BLADE) ×3 IMPLANT
BONE CANC CHIPS 40CC CAN1/2 (Bone Implant) ×3 IMPLANT
BUR CUTTER 7.0 ROUND (BURR) ×1 IMPLANT
BUR MATCHSTICK NEURO 3.0 LAGG (BURR) ×3 IMPLANT
CANISTER SUCT 3000ML PPV (MISCELLANEOUS) ×3 IMPLANT
CAP LOCKING THREADED (Cap) ×16 IMPLANT
CARTRIDGE OIL MAESTRO DRILL (MISCELLANEOUS) ×1 IMPLANT
CHIPS CANC BONE 40CC CAN1/2 (Bone Implant) ×1 IMPLANT
CLOSURE WOUND 1/2 X4 (GAUZE/BANDAGES/DRESSINGS) ×1
CONT SPEC 4OZ CLIKSEAL STRL BL (MISCELLANEOUS) ×3 IMPLANT
COVER BACK TABLE 24X17X13 BIG (DRAPES) IMPLANT
COVER BACK TABLE 60X90IN (DRAPES) ×3 IMPLANT
CROSSLINK SPINAL FUSION (Cage) ×2 IMPLANT
DERMABOND ADVANCED (GAUZE/BANDAGES/DRESSINGS) ×2
DERMABOND ADVANCED .7 DNX12 (GAUZE/BANDAGES/DRESSINGS) ×1 IMPLANT
DIFFUSER DRILL AIR PNEUMATIC (MISCELLANEOUS) ×3 IMPLANT
DRAPE C-ARM 42X72 X-RAY (DRAPES) ×3 IMPLANT
DRAPE C-ARMOR (DRAPES) ×3 IMPLANT
DRAPE HALF SHEET 40X57 (DRAPES) IMPLANT
DRAPE LAPAROTOMY 100X72X124 (DRAPES) ×3 IMPLANT
DRAPE POUCH INSTRU U-SHP 10X18 (DRAPES) ×3 IMPLANT
DRAPE SURG 17X23 STRL (DRAPES) ×3 IMPLANT
DRSG OPSITE 4X5.5 SM (GAUZE/BANDAGES/DRESSINGS) ×2 IMPLANT
DRSG OPSITE POSTOP 4X10 (GAUZE/BANDAGES/DRESSINGS) ×2 IMPLANT
DURAPREP 26ML APPLICATOR (WOUND CARE) ×3 IMPLANT
ELECT BLADE 4.0 EZ CLEAN MEGAD (MISCELLANEOUS) ×3
ELECT REM PT RETURN 9FT ADLT (ELECTROSURGICAL) ×3
ELECTRODE BLDE 4.0 EZ CLN MEGD (MISCELLANEOUS) IMPLANT
ELECTRODE REM PT RTRN 9FT ADLT (ELECTROSURGICAL) ×1 IMPLANT
EVACUATOR 3/16  PVC DRAIN (DRAIN) ×4
EVACUATOR 3/16 PVC DRAIN (DRAIN) ×1 IMPLANT
GAUZE SPONGE 4X4 12PLY STRL (GAUZE/BANDAGES/DRESSINGS) ×3 IMPLANT
GAUZE SPONGE 4X4 12PLY STRL LF (GAUZE/BANDAGES/DRESSINGS) ×2 IMPLANT
GAUZE SPONGE 4X4 16PLY XRAY LF (GAUZE/BANDAGES/DRESSINGS) IMPLANT
GLOVE BIO SURGEON STRL SZ7 (GLOVE) ×2 IMPLANT
GLOVE BIO SURGEON STRL SZ7.5 (GLOVE) ×2 IMPLANT
GLOVE BIO SURGEON STRL SZ8 (GLOVE) ×6 IMPLANT
GLOVE BIOGEL PI IND STRL 7.0 (GLOVE) IMPLANT
GLOVE BIOGEL PI IND STRL 7.5 (GLOVE) IMPLANT
GLOVE BIOGEL PI INDICATOR 7.0 (GLOVE) ×2
GLOVE BIOGEL PI INDICATOR 7.5 (GLOVE) ×2
GLOVE ECLIPSE 8.0 STRL XLNG CF (GLOVE) ×2 IMPLANT
GLOVE INDICATOR 8.5 STRL (GLOVE) ×6 IMPLANT
GOWN STRL REUS W/ TWL LRG LVL3 (GOWN DISPOSABLE) IMPLANT
GOWN STRL REUS W/ TWL XL LVL3 (GOWN DISPOSABLE) ×2 IMPLANT
GOWN STRL REUS W/TWL 2XL LVL3 (GOWN DISPOSABLE) ×2 IMPLANT
GOWN STRL REUS W/TWL LRG LVL3 (GOWN DISPOSABLE) ×6
GOWN STRL REUS W/TWL XL LVL3 (GOWN DISPOSABLE) ×6
GRAFT BNE CHIP CANC 1-8 40 (Bone Implant) IMPLANT
KIT BASIN OR (CUSTOM PROCEDURE TRAY) ×3 IMPLANT
KIT INFUSE X SMALL 1.4CC (Orthopedic Implant) ×2 IMPLANT
KIT ROOM TURNOVER OR (KITS) ×3 IMPLANT
NDL HYPO 21X1.5 SAFETY (NEEDLE) ×1 IMPLANT
NDL HYPO 25X1 1.5 SAFETY (NEEDLE) ×1 IMPLANT
NEEDLE HYPO 21X1.5 SAFETY (NEEDLE) ×3 IMPLANT
NEEDLE HYPO 25X1 1.5 SAFETY (NEEDLE) ×3 IMPLANT
NS IRRIG 1000ML POUR BTL (IV SOLUTION) ×3 IMPLANT
OIL CARTRIDGE MAESTRO DRILL (MISCELLANEOUS) ×3
PACK LAMINECTOMY NEURO (CUSTOM PROCEDURE TRAY) ×3 IMPLANT
PAD ARMBOARD 7.5X6 YLW CONV (MISCELLANEOUS) ×9 IMPLANT
ROD CREO 125MM SPINAL (Rod) ×4 IMPLANT
SCREW AMP MODULAR CREO 6.5X45 (Screw) ×6 IMPLANT
SCREW SPINE CREO AMP 6.5X50 (Screw) ×2 IMPLANT
SPONGE LAP 4X18 X RAY DECT (DISPOSABLE) IMPLANT
SPONGE SURGIFOAM ABS GEL 100 (HEMOSTASIS) ×3 IMPLANT
STRIP BIOACTIVE 20CC 25X100X8 (Miscellaneous) ×4 IMPLANT
STRIP CLOSURE SKIN 1/2X4 (GAUZE/BANDAGES/DRESSINGS) ×3 IMPLANT
SUT VIC AB 0 CT1 18XCR BRD8 (SUTURE) ×1 IMPLANT
SUT VIC AB 0 CT1 8-18 (SUTURE) ×3
SUT VIC AB 2-0 CT1 18 (SUTURE) ×3 IMPLANT
SUT VIC AB 4-0 PS2 27 (SUTURE) ×3 IMPLANT
SYR 20CC LL (SYRINGE) ×3 IMPLANT
TOWEL GREEN STERILE (TOWEL DISPOSABLE) ×2 IMPLANT
TOWEL GREEN STERILE FF (TOWEL DISPOSABLE) ×3 IMPLANT
TRAY FOLEY W/METER SILVER 16FR (SET/KITS/TRAYS/PACK) ×3 IMPLANT
TULIP CREP AMP 5.5MM (Orthopedic Implant) ×8 IMPLANT
WATER STERILE IRR 1000ML POUR (IV SOLUTION) ×3 IMPLANT

## 2016-07-18 NOTE — Progress Notes (Signed)
Patient ID: Erin [name], female   DOB: [birth year], 71 y.o.   MRN: [id number] Patient for all stable condition of back pain  Strength out of 5 lower extremities  Patient presents for extension of her fusion due to instability L3-4 with discitis susceptive facet joints and displacement and loose L4 screws. I've extensively gone over the risks and benefits of the operation the patient as well as perioperative course expectations of outcome and alternatives of surgery and she and her family understand and agree to proceed forward
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
## 2016-07-18 NOTE — Progress Notes (Signed)
OT Cancellation Note  Patient Details Name: Erin [name] MRN: [id number] DOB: February 25, 1945   Cancelled Treatment:    Reason Eval/Treat Not Completed: Medical issues which prohibited therapy (Planned back surgery for today. Will follow.)  Evern Bio 07/18/2016, 8:14 AM  [phone]

## 2016-07-18 NOTE — Progress Notes (Signed)
Heparin drip stopped as per MD orders.

## 2016-07-18 NOTE — Progress Notes (Addendum)
ANTICOAGULATION CONSULT NOTE   Pharmacy Consult for Heparin Indication: atrial fibrillation  Allergies  Allergen Reactions  . Ace Inhibitors Swelling    ACE stopped after pt seen in ED with facial swelling- allergy testing pending  . Ambien [Zolpidem] Other (See Comments)    confusion  . Morphine And Related   . Codeine Nausea And Vomiting    Patient Measurements: Height: 5\' 3"  (160 cm) Weight: 176 lb 12.9 oz (80.2 kg) (pt's complained of back pain. weight in bed) IBW/kg (Calculated) : 52.4  Vital Signs: Temp: 97.6 F (36.4 C) (05/30 0420) Temp Source: Oral (05/30 0420) BP: 134/59 (05/30 0420) Pulse Rate: 59 (05/30 0420)  Labs:  Recent Labs  07/16/16 0435 07/17/16 0445 07/17/16 0500 07/18/16 0000  HGB 7.6* 7.2*  --  7.0*  HCT 24.5* 23.1*  --  22.7*  PLT 174 161  --  164  APTT 103* 88*  --  97*  HEPARINUNFRC 0.98* 0.67  --  0.60  CREATININE 1.13*  --  1.06* 1.16*    Estimated Creatinine Clearance: 43.9 mL/min (A) (by C-G formula based on SCr of 1.16 mg/dL (H)).   Medications:  Scheduled:  . amiodarone  200 mg Oral Daily  . aspirin EC  81 mg Oral Daily  . atorvastatin  80 mg Oral q1800  . cholecalciferol  2,000 Units Oral QPC breakfast  . cycloSPORINE  1 drop Both Eyes BID  . diltiazem  120 mg Oral Daily  . furosemide  40 mg Oral Daily  . levothyroxine  112 mcg Oral QAC breakfast  . lidocaine  1 patch Transdermal Q24H  . mouth rinse  15 mL Mouth Rinse BID  . metoprolol succinate  50 mg Oral Daily  . multivitamin with minerals  1 tablet Oral Daily  . mupirocin ointment   Topical BID  . pantoprazole  40 mg Oral QHS  . polyethylene glycol  17 g Oral BID  . potassium chloride  40 mEq Oral BID  . senna-docusate  2 tablet Oral BID  . sodium chloride flush  3 mL Intravenous Q12H  . sodium chloride flush  3 mL Intravenous Q12H    Assessment: 71 year old female on apixaban PTA for afib, apixaban has been on hold peri-operatively for lumbar revision surgery-  pt is being bridged with heparin.  Last dose of Eliquis was 5/22.  Per notes, plans  for OR today and heparin is off. -aPTT=97, heparin level = 0.6 -Hg= 7.0   Goal of Therapy:  Heparin level= 0.3-0.7 Monitor platelets by anticoagulation protocol: Yes    Plan:  -Will follow plans post OR -When heparin restarts will consider a goal of 0.3-0.5 due to low hg  Harland German, Pharm D 07/18/2016 10:32 AM

## 2016-07-18 NOTE — Progress Notes (Signed)
PT Cancellation Note  Patient Details Name: Erin [name] MRN: [id number] DOB: [birth year]   Cancelled Treatment:    Reason Eval/Treat Not Completed: Medical issues which prohibited therapy (pt for back surgery today).   [name] 07/18/2016, 8:25 AM Skip Mayer PT [phone]

## 2016-07-18 NOTE — Anesthesia Procedure Notes (Signed)
Procedure Name: Intubation Date/Time: 07/18/2016 9:45 PM Performed by: Molli Hazard Pre-anesthesia Checklist: Patient identified, Emergency Drugs available, Suction available and Patient being monitored Patient Re-evaluated:Patient Re-evaluated prior to inductionOxygen Delivery Method: Circle system utilized Preoxygenation: Pre-oxygenation with 100% oxygen Intubation Type: IV induction Ventilation: Mask ventilation without difficulty Laryngoscope Size: Miller and 2 Grade View: Grade II Tube type: Oral Tube size: 7.5 mm Number of attempts: 1 Airway Equipment and Method: Stylet Placement Confirmation: ETT inserted through vocal cords under direct vision,  positive ETCO2 and breath sounds checked- equal and bilateral Secured at: 20 cm Tube secured with: Tape Dental Injury: Teeth and Oropharynx as per pre-operative assessment

## 2016-07-18 NOTE — Anesthesia Preprocedure Evaluation (Signed)
Anesthesia Evaluation  Patient identified by MRN, date of birth, ID band Patient awake    Reviewed: Allergy & Precautions, NPO status , Patient's Chart, lab work & pertinent test results  Airway Mallampati: II  TM Distance: >3 FB Neck ROM: Full    Dental no notable dental hx.    Pulmonary neg pulmonary ROS,    Pulmonary exam normal breath sounds clear to auscultation       Cardiovascular hypertension, + CAD, + CABG and +CHF  + Valvular Problems/Murmurs MR  Rhythm:Regular Rate:Normal + Systolic murmurs Left ventricle: Systolic function was mildly to moderately   reduced. The estimated ejection fraction was in the range of 40%   to 45%. - Mitral valve: There was moderate regurgitation. - Left atrium: The patient has a LAA clip in place. There is   thrombus in the distal LAA tip By definity and color flow   the appendage is isolated/excluded with no flow to LA and no   embolic potential. The atrium was dilated. - Right atrium: The atrium was dilated. - Atrial septum: The septum was thickened. No defect or patent   foramen ovale was identified. 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   Converted with single 120J shock from afib ratge 88 to NSR rate   64.   Neuro/Psych negative neurological ROS  negative psych ROS   GI/Hepatic negative GI ROS, Neg liver ROS,   Endo/Other  Hypothyroidism   Renal/GU negative Renal ROS  negative genitourinary   Musculoskeletal negative musculoskeletal ROS (+)   Abdominal   Peds negative pediatric ROS (+)  Hematology  (+) anemia ,   Anesthesia Other Findings   Reproductive/Obstetrics negative OB ROS                             Anesthesia Physical Anesthesia Plan  ASA: III  Anesthesia Plan: General   Post-op Pain Management:    Induction: Intravenous  Airway Management Planned: Oral ETT  Additional  Equipment:   Intra-op Plan:   Post-operative Plan: Extubation in OR  Informed Consent: I have reviewed the patients History and Physical, chart, labs and discussed the procedure including the risks, benefits and alternatives for the proposed anesthesia with the patient or authorized representative who has indicated his/her understanding and acceptance.   Dental advisory given  Plan Discussed with: CRNA and Surgeon  Anesthesia Plan Comments:         Anesthesia Quick Evaluation

## 2016-07-19 ENCOUNTER — Inpatient Hospital Stay (HOSPITAL_COMMUNITY): Payer: Medicare HMO

## 2016-07-19 ENCOUNTER — Encounter (HOSPITAL_COMMUNITY): Payer: Self-pay | Admitting: Radiology

## 2016-07-19 DIAGNOSIS — R7989 Other specified abnormal findings of blood chemistry: Secondary | ICD-10-CM

## 2016-07-19 DIAGNOSIS — M4640 Discitis, unspecified, site unspecified: Secondary | ICD-10-CM | POA: Diagnosis present

## 2016-07-19 DIAGNOSIS — J90 Pleural effusion, not elsewhere classified: Secondary | ICD-10-CM

## 2016-07-19 DIAGNOSIS — R0602 Shortness of breath: Secondary | ICD-10-CM

## 2016-07-19 LAB — POCT I-STAT 7, (LYTES, BLD GAS, ICA,H+H)
Acid-Base Excess: 13 mmol/L — ABNORMAL HIGH (ref 0.0–2.0)
Bicarbonate: 36.3 mmol/L — ABNORMAL HIGH (ref 20.0–28.0)
Calcium, Ion: 1.15 mmol/L (ref 1.15–1.40)
HCT: 19 % — ABNORMAL LOW (ref 36.0–46.0)
Hemoglobin: 6.5 g/dL — CL (ref 12.0–15.0)
O2 Saturation: 100 %
Patient temperature: 36.4
Potassium: 4.1 mmol/L (ref 3.5–5.1)
Sodium: 128 mmol/L — ABNORMAL LOW (ref 135–145)
TCO2: 38 mmol/L (ref 0–100)
pCO2 arterial: 39.7 mmHg (ref 32.0–48.0)
pH, Arterial: 7.567 — ABNORMAL HIGH (ref 7.350–7.450)
pO2, Arterial: 416 mmHg — ABNORMAL HIGH (ref 83.0–108.0)

## 2016-07-19 LAB — PREPARE PLATELET PHERESIS: Unit division: 0

## 2016-07-19 LAB — PREPARE FRESH FROZEN PLASMA
Unit division: 0
Unit division: 0

## 2016-07-19 LAB — RENAL FUNCTION PANEL
Albumin: 2.1 g/dL — ABNORMAL LOW (ref 3.5–5.0)
Anion gap: 7 (ref 5–15)
BUN: 7 mg/dL (ref 6–20)
CO2: 31 mmol/L (ref 22–32)
Calcium: 8.1 mg/dL — ABNORMAL LOW (ref 8.9–10.3)
Chloride: 92 mmol/L — ABNORMAL LOW (ref 101–111)
Creatinine, Ser: 1.03 mg/dL — ABNORMAL HIGH (ref 0.44–1.00)
GFR calc Af Amer: >60 mL/min (ref >60)
GFR calc non Af Amer: 53 mL/min — ABNORMAL LOW (ref >60)
Glucose, Bld: 104 mg/dL — ABNORMAL HIGH (ref 65–99)
Phosphorus: 4.2 mg/dL (ref 2.5–4.6)
Potassium: 4 mmol/L (ref 3.5–5.1)
Sodium: 130 mmol/L — ABNORMAL LOW (ref 135–145)

## 2016-07-19 LAB — TYPE AND SCREEN
ABO/RH(D): O POS
Antibody Screen: NEGATIVE
Unit division: 0
Unit division: 0

## 2016-07-19 LAB — BPAM RBC
Blood Product Expiration Date: 201806252359
Blood Product Expiration Date: 201806252359
ISSUE DATE / TIME: 201805302147
ISSUE DATE / TIME: 201805302147
Unit Type and Rh: 5100
Unit Type and Rh: 5100

## 2016-07-19 LAB — MAGNESIUM: Magnesium: 1.7 mg/dL (ref 1.7–2.4)

## 2016-07-19 LAB — DIC (DISSEMINATED INTRAVASCULAR COAGULATION)PANEL
D-Dimer, Quant: 8.63 ug/mL-FEU — ABNORMAL HIGH (ref 0.00–0.50)
Fibrinogen: 449 mg/dL (ref 210–475)
Prothrombin Time: 15.3 seconds — ABNORMAL HIGH (ref 11.4–15.2)
Smear Review: NONE SEEN
aPTT: 34 seconds (ref 24–36)

## 2016-07-19 LAB — BPAM PLATELET PHERESIS
Blood Product Expiration Date: 201805302359
ISSUE DATE / TIME: 201805302302
Unit Type and Rh: 6200

## 2016-07-19 LAB — MRSA PCR SCREENING: MRSA by PCR: NEGATIVE

## 2016-07-19 LAB — BPAM FFP
Blood Product Expiration Date: 201806012359
Blood Product Expiration Date: 201806012359
ISSUE DATE / TIME: 201805302301
ISSUE DATE / TIME: 201805302301
Unit Type and Rh: 5100
Unit Type and Rh: 5100

## 2016-07-19 LAB — CBC
HCT: 25.7 % — ABNORMAL LOW (ref 36.0–46.0)
Hemoglobin: 8.1 g/dL — ABNORMAL LOW (ref 12.0–15.0)
MCH: 27 pg (ref 26.0–34.0)
MCHC: 31.5 g/dL (ref 30.0–36.0)
MCV: 85.7 fL (ref 78.0–100.0)
Platelets: 147 10*3/uL — ABNORMAL LOW (ref 150–400)
RBC: 3 MIL/uL — ABNORMAL LOW (ref 3.87–5.11)
RDW: 17.2 % — ABNORMAL HIGH (ref 11.5–15.5)
WBC: 9.6 10*3/uL (ref 4.0–10.5)

## 2016-07-19 LAB — HEPARIN LEVEL (UNFRACTIONATED): Heparin Unfractionated: 0.23 IU/mL — ABNORMAL LOW (ref 0.30–0.70)

## 2016-07-19 LAB — DIC (DISSEMINATED INTRAVASCULAR COAGULATION) PANEL (NOT AT ARMC)
INR: 1.2
Platelets: 150 10*3/uL (ref 150–400)

## 2016-07-19 MED ORDER — HYDROMORPHONE HCL 1 MG/ML IJ SOLN
0.2500 mg | INTRAMUSCULAR | Status: DC | PRN
  Administered 2016-07-19 (×5): 0.25 mg via INTRAVENOUS

## 2016-07-19 MED ORDER — NEOSTIGMINE METHYLSULFATE 5 MG/5ML IV SOSY
PREFILLED_SYRINGE | INTRAVENOUS | Status: AC
  Filled 2016-07-19: qty 5

## 2016-07-19 MED ORDER — ACETAMINOPHEN 325 MG PO TABS: 650.0000 mg | ORAL_TABLET | ORAL | Status: DC | PRN

## 2016-07-19 MED ORDER — ALUM & MAG HYDROXIDE-SIMETH 200-200-20 MG/5ML PO SUSP: 30.0000 mL | Freq: Four times a day (QID) | ORAL | Status: DC | PRN

## 2016-07-19 MED ORDER — POTASSIUM CHLORIDE IN NACL 20-0.45 MEQ/L-% IV SOLN
INTRAVENOUS | Status: DC
  Administered 2016-07-19 – 2016-07-20 (×2): via INTRAVENOUS
  Filled 2016-07-19 (×3): qty 1000

## 2016-07-19 MED ORDER — MENTHOL 3 MG MT LOZG: 1.0000 | LOZENGE | OROMUCOSAL | Status: DC | PRN

## 2016-07-19 MED ORDER — SODIUM CHLORIDE 0.9% FLUSH
3.0000 mL | Freq: Two times a day (BID) | INTRAVENOUS | Status: DC
  Administered 2016-07-19 – 2016-08-01 (×10): 3 mL via INTRAVENOUS

## 2016-07-19 MED ORDER — HYDROMORPHONE HCL 1 MG/ML IJ SOLN
INTRAMUSCULAR | Status: AC
  Filled 2016-07-19: qty 0.5

## 2016-07-19 MED ORDER — ACETAMINOPHEN 650 MG RE SUPP: 650.0000 mg | RECTAL | Status: DC | PRN

## 2016-07-19 MED ORDER — GLYCOPYRROLATE 0.2 MG/ML IJ SOLN
INTRAMUSCULAR | Status: DC | PRN
  Administered 2016-07-19: 0.6 mg via INTRAVENOUS

## 2016-07-19 MED ORDER — HYDROMORPHONE HCL 1 MG/ML IJ SOLN
INTRAMUSCULAR | Status: AC
  Administered 2016-07-19: 0.25 mg via INTRAVENOUS
  Filled 2016-07-19: qty 1

## 2016-07-19 MED ORDER — HYDROMORPHONE HCL 1 MG/ML IJ SOLN
0.5000 mg | INTRAMUSCULAR | Status: DC | PRN
  Administered 2016-07-19 (×2): 0.5 mg via INTRAVENOUS
  Filled 2016-07-19 (×2): qty 0.5

## 2016-07-19 MED ORDER — BUPIVACAINE LIPOSOME 1.3 % IJ SUSP
INTRAMUSCULAR | Status: DC | PRN
  Administered 2016-07-19: 20 mL

## 2016-07-19 MED ORDER — PHENOL 1.4 % MT LIQD: 1.0000 | OROMUCOSAL | Status: DC | PRN

## 2016-07-19 MED ORDER — ONDANSETRON HCL 4 MG/2ML IJ SOLN
INTRAMUSCULAR | Status: AC
  Filled 2016-07-19: qty 2

## 2016-07-19 MED ORDER — IOPAMIDOL (ISOVUE-370) INJECTION 76%
INTRAVENOUS | Status: AC
  Administered 2016-07-19: 50 mL
  Filled 2016-07-19: qty 100

## 2016-07-19 MED ORDER — SODIUM CHLORIDE 0.9 % IV SOLN: 250.0000 mL | INTRAVENOUS | Status: DC

## 2016-07-19 MED ORDER — CYCLOBENZAPRINE HCL 10 MG PO TABS
10.0000 mg | ORAL_TABLET | Freq: Three times a day (TID) | ORAL | Status: DC | PRN
  Administered 2016-07-21 – 2016-08-01 (×27): 10 mg via ORAL
  Filled 2016-07-19 (×27): qty 1

## 2016-07-19 MED ORDER — NALOXONE HCL 0.4 MG/ML IJ SOLN
0.4000 mg | INTRAMUSCULAR | Status: DC | PRN
  Administered 2016-07-19: 0.4 mg via INTRAVENOUS
  Filled 2016-07-19 (×2): qty 1

## 2016-07-19 MED ORDER — SODIUM CHLORIDE 0.9 % IJ SOLN
INTRAMUSCULAR | Status: AC
  Filled 2016-07-19: qty 20

## 2016-07-19 MED ORDER — SODIUM CHLORIDE 0.9% FLUSH: 3.0000 mL | INTRAVENOUS | Status: DC | PRN

## 2016-07-19 MED ORDER — NEOSTIGMINE METHYLSULFATE 10 MG/10ML IV SOLN
INTRAVENOUS | Status: DC | PRN
  Administered 2016-07-19: 4 mg via INTRAVENOUS

## 2016-07-19 MED ORDER — ONDANSETRON HCL 4 MG PO TABS: 4.0000 mg | ORAL_TABLET | Freq: Four times a day (QID) | ORAL | Status: DC | PRN

## 2016-07-19 MED ORDER — PROMETHAZINE HCL 25 MG/ML IJ SOLN: 6.2500 mg | INTRAMUSCULAR | Status: DC | PRN

## 2016-07-19 MED ORDER — ONDANSETRON HCL 4 MG/2ML IJ SOLN
INTRAMUSCULAR | Status: DC | PRN
  Administered 2016-07-19: 4 mg via INTRAVENOUS

## 2016-07-19 MED ORDER — ONDANSETRON HCL 4 MG/2ML IJ SOLN: 4.0000 mg | Freq: Four times a day (QID) | INTRAMUSCULAR | Status: DC | PRN

## 2016-07-19 MED ORDER — PANTOPRAZOLE SODIUM 40 MG IV SOLR
40.0000 mg | Freq: Every day | INTRAVENOUS | Status: DC
  Filled 2016-07-19: qty 40

## 2016-07-19 NOTE — Progress Notes (Signed)
OT Cancellation Note  Patient Details Name: Erin [name] MRN: [id number] DOB: 03-30-1945   Cancelled Treatment:    Reason Eval/Treat Not Completed: Medical issues which prohibited therapy (HR 37)  [facility] Tammy Ericsson, OT/L  [phone] 07/19/2016 07/19/2016, 2:17 PM

## 2016-07-19 NOTE — Progress Notes (Signed)
Informed Dr. Catha Gosselin that patients HR is in the high 30's BP 101/53 patient is alert and oriented times 4. Denies and CP SOB. EKG shows Marked Bradycardia. Per MD give one dose of Narcan now and continue to monitor patient.

## 2016-07-19 NOTE — Transfer of Care (Signed)
Immediate Anesthesia Transfer of Care Note  Patient: Erin [name]  Procedure(s) Performed: Procedure(s) with comments: Revision and Exploration of fusion with extension Lumbar Two-Sacral One (N/A) - Revision, Exploration of fusion with extension L2-S1  Patient Location: PACU  Anesthesia Type:General  Level of Consciousness: awake, sedated and patient cooperative  Airway & Oxygen Therapy: Patient connected to nasal cannula oxygen  Post-op Assessment: Report given to RN and Post -op Vital signs reviewed and stable  Post vital signs: Reviewed and stable  Last Vitals:  Vitals:   07/17/16 2010 07/18/16 0420  BP: (!) 129/47 (!) 134/59  Pulse: (!) 52 (!) 59  Resp: 18 20  Temp: 36.3 C 36.4 C    Last Pain:  Vitals:   07/18/16 1409  TempSrc:   PainSc: 3       Patients Stated Pain Goal: 3 (07/18/16 9450)  Complications: No apparent anesthesia complications

## 2016-07-19 NOTE — Progress Notes (Signed)
Informed Dr. Catha Gosselin that patient HR is now in the mid 40's and is climbing back towards the 50's and that patient is still and alert and oriented with complaints that the narcan made her burn from the inside out but it is wearing off. MD states to continue to monitor patient.
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
## 2016-07-19 NOTE — Progress Notes (Signed)
PT Cancellation Note  Patient Details Name: CHYVONNE LUEKE MRN: [id number] DOB: [birth year]   Cancelled Treatment:    Reason Eval/Treat Not Completed: Medical issues which prohibited therapy Pt HR in the 30s. RN aware. Will hold PT today and follow up as appropriate.   Blake Divine A Tiari Andringa 07/19/2016, 2:43 PM Mylo Red, PT, DPT [phone]

## 2016-07-19 NOTE — Progress Notes (Signed)
Subjective: Patient reports Condition back pain but legs feel better  Objective: Vital signs in last 24 hours: Temp:  [97 F (36.1 C)-98.9 F (37.2 C)] 98.9 F (37.2 C) (05/31 0730) Pulse Rate:  [63-67] 67 (05/31 0800) Resp:  [18-23] 21 (05/31 0800) BP: (144-167)/(74-80) 144/74 (05/31 0800) SpO2:  [94 %-99 %] 99 % (05/31 0800) Weight:  [80.2 kg (176 lb 12.9 oz)] 80.2 kg (176 lb 12.9 oz) (05/31 0400)  Intake/Output from previous day: 05/30 0701 - 05/31 0700 In: 3462.5 [I.V.:2052.5; Blood:1410] Out: 2150 [Urine:1400; Drains:50; Blood:700] Intake/Output this shift: Total I/O In: 150 [I.V.:150] Out: 160 [Urine:150; Drains:10]  Awake alert strength out of 5 incision clean dry and intact small amount of drainage around the drain exit hole  Lab Results:  Recent Labs  07/18/16 0000 07/18/16 2216 07/19/16 0139 07/19/16 0302  WBC 7.6  --  9.6  --   HGB 7.0* 6.5* 8.1*  --   HCT 22.7* 19.0* 25.7*  --   PLT 164  --  147* 150   BMET  Recent Labs  07/18/16 0000 07/18/16 2216 07/19/16 0139  NA 124* 128* 130*  K 4.3 4.1 4.0  CL 87*  --  92*  CO2 32  --  31  GLUCOSE 126*  --  104*  BUN 9  --  7  CREATININE 1.16*  --  1.03*  CALCIUM 8.1*  --  8.1*    Studies/Results: Dg Lumbar Spine 2-3 Views  Result Date: 07/19/2016 CLINICAL DATA:  Revision of lumbosacral spinal fusion at L2-S1. EXAM: LUMBAR SPINE - 2-3 VIEW COMPARISON:  CT of the lumbar spine performed 07/09/2016 FINDINGS: Two fluoroscopic C-arm images are provided from the OR, demonstrating lumbosacral pedicle screws. Underlying decompression is again noted. IMPRESSION: Revision of lumbosacral spinal fusion at L2-S1. Electronically Signed   By: Roanna Raider M.D.   On: 07/19/2016 05:25   Ct Angio Chest Pe W Or Wo Contrast  Result Date: 07/19/2016 CLINICAL DATA:  Elevated D-dimer.  Decreased oxygen saturation. EXAM: CT ANGIOGRAPHY CHEST WITH CONTRAST TECHNIQUE: Multidetector CT imaging of the chest was performed using  the standard protocol during bolus administration of intravenous contrast. Multiplanar CT image reconstructions and MIPs were obtained to evaluate the vascular anatomy. CONTRAST:  50 mL Isovue 370 COMPARISON:  Chest radiograph 07/19/2016.  Chest CTA 05/25/2016. FINDINGS: Cardiovascular: Pulmonary arterial opacification is adequate, however motion artifact limits segmental and subsegmental evaluation in multiple regions bilaterally. There is no lobar or more central pulmonary embolus, and no convincing segmental emboli are identified within limitations of motion. A left PICC terminates in the SVC. There is mild cardiomegaly. Sequelae of prior CABG are identified, and there is three-vessel coronary artery atherosclerosis. Previously described bulging of the proximal descending thoracic aorta is not well seen on the current examination due to motion artifact and diminished contrast opacification of the aorta. Mediastinum/Nodes: No enlarged axillary, mediastinal, or hilar lymph nodes. Scattered small mediastinal lymph nodes are larger than on the prior CTA but benign/ reactive in appearance. Lungs/Pleura: Small pleural effusions, left larger than right. Evaluation of the lung parenchyma is limited by respiratory motion artifact and expiratory phase imaging. Mosaic attenuation in the upper lobes may reflect air trapping in the setting of expiratory phase imaging. Bandlike opacities in both upper lobes and along the fissures bilaterally suggest atelectasis. There is also evidence of volume loss and atelectasis in both lower lobes. Eventration of the right hemidiaphragm is noted. Upper Abdomen: Vicarious excretion of contrast in the gallbladder. Musculoskeletal: Partially visualized  drain in the posterior soft tissues of the lower thoracic spine. Prior anterior fusion in the lower cervical spine. Old L1 superior endplate compression fracture/ Schmorl's node. Thoracic spondylosis. Bilateral glenohumeral osteoarthrosis.  Review of the MIP images confirms the above findings. IMPRESSION: 1. No definite segmental or more central pulmonary emboli identified. 2. Expiratory imaging with respiratory motion artifact and atelectasis bilaterally. 3. Small pleural effusions. 4.  Aortic Atherosclerosis (ICD10-I70.0). Electronically Signed   By: Sebastian Ache M.D.   On: 07/19/2016 09:37   Dg Chest Port 1 View  Result Date: 07/19/2016 CLINICAL DATA:  Postoperative shortness of breath. EXAM: PORTABLE CHEST 1 VIEW COMPARISON:  Chest radiograph Jun 29, 2016 FINDINGS: Cardiac silhouette is mildly enlarged and unchanged. Status post median sternotomy for CABG. Catheter looped over LEFT chest, suspected to be external to the patient. LEFT PICC distal tip projects in mid superior vena cava, unchanged. Similar bronchitic changes. Increasing RIGHT midlung zone atelectasis with patchy consolidation LEFT mid and lower lung zone. Trace LEFT pleural effusion. No pneumothorax. Status post CABG. Surgical clips at GE junction. IMPRESSION: Increasing LEFT mid and lower lung zone consolidations, increasing RIGHT midlung zone atelectasis. Similar bronchitic changes. Electronically Signed   By: Awilda Metro M.D.   On: 07/19/2016 02:09   Dg C-arm 1-60 Min  Result Date: 07/19/2016 CLINICAL DATA:  Revision of lumbosacral spinal fusion at L2-S1. EXAM: LUMBAR SPINE - 2-3 VIEW COMPARISON:  CT of the lumbar spine performed 07/09/2016 FINDINGS: Two fluoroscopic C-arm images are provided from the OR, demonstrating lumbosacral pedicle screws. Underlying decompression is again noted. IMPRESSION: Revision of lumbosacral spinal fusion at L2-S1. Electronically Signed   By: Roanna Raider M.D.   On: 07/19/2016 05:25    Assessment/Plan: Mobilize in her brace with physical and occupational therapy today and tomorrow.  LOS: 28 days     Leobardo Granlund P 07/19/2016, 10:25 AM

## 2016-07-19 NOTE — Op Note (Signed)
Preoperative diagnosis: Instability L3-4 discitis L3-4 paraspinal abscess L3-S1 septic facet arthropathy L3-4  Postoperative diagnosis: Same  Procedure: #1 exploration of fusion removal of hardware L4-S1 with removal of rods top tightening nuts cross-link and bilateral L4 screws.  #2 placement of bilateral L2 and L3 screws utilizing the globus Creole amp modular pedicle screw set was 6 5 x 45 screws inserted at L2 on the left 6 5 x 50 L2 and the right and 6 5 x 45 at L3 bilaterally with placement of rods from L2-S1 and top tightening nuts.  #3 posterior lateral arthrodesis L2-L5 utilizing cancellus chips kinex and BMP  Surgeon: Jillyn Hidden Theora Vankirk  Anesthesia: Gen.  EBL: 700  History of present illness: Patient is a 71 year old female who developed progressive instability at L3-4 after seating her back with Escherichia coli from a kidney infection. She developed discitis at L3-4 facet arthropathy at L3-4 and had marked instability that level and increased pain. After patient had adequate initiation of antibiotics and show clinical signs of improvement we recommended restabilization with removal of bilateral loose L4 screws placement of bilateral L2 and L3 screws and time that enteral construct. We extensively went over the risks and benefits of that operation with the patient and patient's family and they understood and agreed to proceed forward.  Operative procedure: Patient brought in the or was induced under general anesthesia positioned prone the Wilson frame her back was prepped and draped in routine sterile fashion her old incision was opened up and extended cephalad her scar was ellipticized subperiosteal dissections care lamina of L1 L2-L3 and and the construct from L4-S1. There was a dense amount of bleeding and oozing from multiple different sites no overt bleeders but diffuse is she came in the operation somewhat anemic since she was transfused PRBCs she also was given FFP and platelets. After  expose the TPs at L2 and L3 and expose all the old hardware it was not a markedly unstable a disconnect the cross-link distally the top tightening nuts the L4 screws were bathed in pus I removed the screws easily the L5 and S1 screws bilaterally seem to be solid and fine. I took a bunch of additional cultures from the pedicle holes sent all the hardware off for culture took some more culture of subdural material in the paraspinal area. Then attention was taken to placement of bilateral L2 and L3 screws under fluoroscopy. Pilot holes were drilled they were cannulated with the awl probed O55 Probed again and all screws placed excellent purchase. Then I selected to 125 lordotic rods modify the contour placed M and anchored all the knots in place. Replaced a cross-link then aggressively decorticated the TPs from L2 down L4 and extending partially down towards L5 then packed the cancellus chips thekinex and BMP posterior laterally from L2-L5. Then placed a large Hemovac drain injected X Perrone the fascia and closed the wound in layers with interrupted Vicryl and a running 4 subcuticular Dermabond benzo and Steri-Strips and sterile dressings applied patient recovered in stable condition. At the end the case all needle counts sponge counts were correct.

## 2016-07-19 NOTE — Progress Notes (Signed)
[facility]    Date of Admission:  06/21/2016   Total days of antibiotics 29                                                                                     Day 13 cefazolin   ID: Erin [name] is a 71 y.o. female with  complicated history of spinal stenosis s/p laminectomy On 3/7 c/b fever, worsening back pain found to have sepsis due to ecoli bacteremia and epidural abscess s/p I x D of lumbar wound and evacuation of lumbar epidural abscess on 5/8. OR cx are + pan sensitive ecoli. Aspirated new fluid at L3-L4 space on 5/24. No S/P hardware removal and stabilization surgery 5/30.   Principal Problem:   Sepsis (HCC) Active Problems:   Spinal stenosis at L4-L5 level   Dyslipidemia   Acute midline low back pain without sciatica   Hypokalemia   CAD in native artery   Hx of CABG   AKI (acute kidney injury) (HCC)   Hyponatremia   Persistent atrial fibrillation (HCC)   Spinal abscess (HCC)   Acute on chronic diastolic (congestive) heart failure (HCC)   Atrial flutter (HCC)   Normocytic anemia   Paroxysmal atrial fibrillation (HCC)   Diskitis    Subjective: Still in pain but able to rest comfortably now with medication. No difficulty breathing but does endorse some nausea. Difficult to finish breakfast today.   Medications:  . amiodarone  200 mg Oral Daily  . aspirin EC  81 mg Oral Daily  . atorvastatin  80 mg Oral q1800  . bupivacaine liposome  20 mL Infiltration To OR  . cholecalciferol  2,000 Units Oral QPC breakfast  . cycloSPORINE  1 drop Both Eyes BID  . diltiazem  120 mg Oral Daily  . furosemide  40 mg Oral Daily  . HYDROmorphone      . levothyroxine  112 mcg Oral QAC breakfast  . lidocaine  1 patch Transdermal Q24H  . mouth rinse  15 mL Mouth Rinse BID  . metoprolol succinate  50 mg Oral Daily  . multivitamin with minerals  1 tablet Oral Daily  . mupirocin ointment   Topical BID  . pantoprazole  40 mg Oral QHS  . pantoprazole  (PROTONIX) IV  40 mg Intravenous QHS  . polyethylene glycol  17 g Oral BID  . potassium chloride  40 mEq Oral BID  . senna-docusate  2 tablet Oral BID  . sodium chloride flush  3 mL Intravenous Q12H  . sodium chloride flush  3 mL Intravenous Q12H  . sodium chloride flush  3 mL Intravenous Q12H    Objective: Vital signs in last 24 hours: Temp:  [97 F (36.1 C)-98.9 F (37.2 C)] 98.9 F (37.2 C) (05/31 0730) Pulse Rate:  [63-70] 70 (05/31 1000) Resp:  [18-26] 26 (05/31 1000) BP: (143-167)/(68-88) 143/88 (05/31 1000) SpO2:  [93 %-99 %] 96 % (05/31 1000) Weight:  [176 lb 12.9 oz (80.2 kg)] 176 lb 12.9 oz (80.2 kg) (05/31 0400)   Constitutional: Oriented to person, place, and time. Sleeping upon arrival to room. Appears much more comfortable since  surgery.  Mouth/Throat: Oropharynx is clear and moist. No oropharyngeal exudate. Blister is gone.  Cardiovascular: Normal rate, regular rhythm and normal heart sounds. No murmur heard.  Pulmonary/Chest: Effort normal, no respiratory distress.. Diminished throughout with rales heard over LLL.  Abdominal: Soft. Bowel sounds are hypoactive. No distension. There is no tenderness.  Skin: Skin is warm and dry. No rash noted. No erythema. Multiple bruises to arms.   Lab Results  Recent Labs  07/18/16 0000 07/18/16 2216 07/19/16 0139  WBC 7.6  --  9.6  HGB 7.0* 6.5* 8.1*  HCT 22.7* 19.0* 25.7*  NA 124* 128* 130*  K 4.3 4.1 4.0  CL 87*  --  92*  CO2 32  --  31  BUN 9  --  7  CREATININE 1.16*  --  1.03*   Liver Panel  Recent Labs  07/18/16 0000 07/19/16 0139  ALBUMIN 1.9* 2.1*   Sedimentation Rate No results for input(s): ESRSEDRATE in the last 72 hours. C-Reactive Protein No results for input(s): CRP in the last 72 hours.  Microbiology:  Studies/Results: Dg Lumbar Spine 2-3 Views  Result Date: 07/19/2016 CLINICAL DATA:  Revision of lumbosacral spinal fusion at L2-S1. EXAM: LUMBAR SPINE - 2-3 VIEW COMPARISON:  CT of the  lumbar spine performed 07/09/2016 FINDINGS: Two fluoroscopic C-arm images are provided from the OR, demonstrating lumbosacral pedicle screws. Underlying decompression is again noted. IMPRESSION: Revision of lumbosacral spinal fusion at L2-S1. Electronically Signed   By: Roanna Raider M.D.   On: 07/19/2016 05:25   Ct Angio Chest Pe W Or Wo Contrast  Result Date: 07/19/2016 CLINICAL DATA:  Elevated D-dimer.  Decreased oxygen saturation. EXAM: CT ANGIOGRAPHY CHEST WITH CONTRAST TECHNIQUE: Multidetector CT imaging of the chest was performed using the standard protocol during bolus administration of intravenous contrast. Multiplanar CT image reconstructions and MIPs were obtained to evaluate the vascular anatomy. CONTRAST:  50 mL Isovue 370 COMPARISON:  Chest radiograph 07/19/2016.  Chest CTA 05/25/2016. FINDINGS: Cardiovascular: Pulmonary arterial opacification is adequate, however motion artifact limits segmental and subsegmental evaluation in multiple regions bilaterally. There is no lobar or more central pulmonary embolus, and no convincing segmental emboli are identified within limitations of motion. A left PICC terminates in the SVC. There is mild cardiomegaly. Sequelae of prior CABG are identified, and there is three-vessel coronary artery atherosclerosis. Previously described bulging of the proximal descending thoracic aorta is not well seen on the current examination due to motion artifact and diminished contrast opacification of the aorta. Mediastinum/Nodes: No enlarged axillary, mediastinal, or hilar lymph nodes. Scattered small mediastinal lymph nodes are larger than on the prior CTA but benign/ reactive in appearance. Lungs/Pleura: Small pleural effusions, left larger than right. Evaluation of the lung parenchyma is limited by respiratory motion artifact and expiratory phase imaging. Mosaic attenuation in the upper lobes may reflect air trapping in the setting of expiratory phase imaging. Bandlike  opacities in both upper lobes and along the fissures bilaterally suggest atelectasis. There is also evidence of volume loss and atelectasis in both lower lobes. Eventration of the right hemidiaphragm is noted. Upper Abdomen: Vicarious excretion of contrast in the gallbladder. Musculoskeletal: Partially visualized drain in the posterior soft tissues of the lower thoracic spine. Prior anterior fusion in the lower cervical spine. Old L1 superior endplate compression fracture/ Schmorl's node. Thoracic spondylosis. Bilateral glenohumeral osteoarthrosis. Review of the MIP images confirms the above findings. IMPRESSION: 1. No definite segmental or more central pulmonary emboli identified. 2. Expiratory imaging with respiratory motion artifact and  atelectasis bilaterally. 3. Small pleural effusions. 4.  Aortic Atherosclerosis (ICD10-I70.0). Electronically Signed   By: Sebastian Ache M.D.   On: 07/19/2016 09:37   Dg Chest Port 1 View  Result Date: 07/19/2016 CLINICAL DATA:  Postoperative shortness of breath. EXAM: PORTABLE CHEST 1 VIEW COMPARISON:  Chest radiograph Jun 29, 2016 FINDINGS: Cardiac silhouette is mildly enlarged and unchanged. Status post median sternotomy for CABG. Catheter looped over LEFT chest, suspected to be external to the patient. LEFT PICC distal tip projects in mid superior vena cava, unchanged. Similar bronchitic changes. Increasing RIGHT midlung zone atelectasis with patchy consolidation LEFT mid and lower lung zone. Trace LEFT pleural effusion. No pneumothorax. Status post CABG. Surgical clips at GE junction. IMPRESSION: Increasing LEFT mid and lower lung zone consolidations, increasing RIGHT midlung zone atelectasis. Similar bronchitic changes. Electronically Signed   By: Awilda Metro M.D.   On: 07/19/2016 02:09   Dg C-arm 1-60 Min  Result Date: 07/19/2016 CLINICAL DATA:  Revision of lumbosacral spinal fusion at L2-S1. EXAM: LUMBAR SPINE - 2-3 VIEW COMPARISON:  CT of the lumbar spine  performed 07/09/2016 FINDINGS: Two fluoroscopic C-arm images are provided from the OR, demonstrating lumbosacral pedicle screws. Underlying decompression is again noted. IMPRESSION: Revision of lumbosacral spinal fusion at L2-S1. Electronically Signed   By: Roanna Raider M.D.   On: 07/19/2016 05:25     Assessment/Plan: KINGSLEE DOWSE is POD 1 S/P exploration of L4-S1 fusion with hardware removal with placement of b/l L2 and L3 screws and rods for stabilization. Dense amount of bleeding from multiple different sites with EBL 700cc. Dr. Wynetta Emery obtained several additional samples from pedicle holes, subdural material in paraspinal area and sent all the hardware for Cx. Hemovac drain remains. She has received 2 u PRBC, 2 FFP and Platelets.   Lumbar Pedicle Cx 5/30 >> No organisms, few WBCs Hardware from Lumbar Cx 5/30 >>  No organisms, few WBCs  E.coli discitis/epidural abscess =  - Continues to be afebrile and without leukocytosis on cefazolin. Slight expected bump in WBC s/p surgery.  - No growth x 5 days from lumbar aspirate on 5/24 - Plan unchanged at this time - 8 weeks with cefazolin from 5/9  Back pain =  - Instructions per neurosurgery to mobilize in her brace - Physical and occupational therapy  Oxygen Desaturations =  - D Dimer elevated 8.63. Chest CT negative for PE - CT scan showing b/l small pleural effusions L>R and suggestive of atelectasis - Reinforced use of IS and mobilization  Erin Alberts, MSN, NP-C [facility] Cell: [phone] Pager: [phone]  07/19/2016, 11:02 AM

## 2016-07-19 NOTE — Anesthesia Postprocedure Evaluation (Signed)
Anesthesia Post Note  Patient: Erin [name]  Procedure(s) Performed: Procedure(s) (LRB): Revision and Exploration of fusion with extension Lumbar Two-Sacral One (N/A)  Patient location during evaluation: PACU Anesthesia Type: General Level of consciousness: awake and alert Pain management: pain level controlled Vital Signs Assessment: post-procedure vital signs reviewed and stable Respiratory status: spontaneous breathing, nonlabored ventilation, respiratory function stable and patient connected to nasal cannula oxygen Cardiovascular status: blood pressure returned to baseline and stable Postop Assessment: no signs of nausea or vomiting Anesthetic complications: no       Last Vitals:  Vitals:   07/17/16 2010 07/18/16 0420  BP: (!) 129/47 (!) 134/59  Pulse: (!) 52 (!) 59  Resp: 18 20  Temp: 36.3 C 36.4 C    Last Pain:  Vitals:   07/18/16 1409  TempSrc:   PainSc: 3                  Jaisean Monteforte DAVID

## 2016-07-20 ENCOUNTER — Inpatient Hospital Stay (HOSPITAL_COMMUNITY): Payer: Medicare HMO

## 2016-07-20 DIAGNOSIS — R7989 Other specified abnormal findings of blood chemistry: Secondary | ICD-10-CM

## 2016-07-20 DIAGNOSIS — R001 Bradycardia, unspecified: Secondary | ICD-10-CM

## 2016-07-20 LAB — RENAL FUNCTION PANEL
Albumin: 1.9 g/dL — ABNORMAL LOW (ref 3.5–5.0)
Anion gap: 8 (ref 5–15)
BUN: 8 mg/dL (ref 6–20)
CO2: 27 mmol/L (ref 22–32)
Calcium: 7.9 mg/dL — ABNORMAL LOW (ref 8.9–10.3)
Chloride: 93 mmol/L — ABNORMAL LOW (ref 101–111)
Creatinine, Ser: 1.12 mg/dL — ABNORMAL HIGH (ref 0.44–1.00)
GFR calc Af Amer: 55 mL/min — ABNORMAL LOW (ref >60)
GFR calc non Af Amer: 48 mL/min — ABNORMAL LOW (ref >60)
Glucose, Bld: 102 mg/dL — ABNORMAL HIGH (ref 65–99)
Phosphorus: 2.7 mg/dL (ref 2.5–4.6)
Potassium: 5.2 mmol/L — ABNORMAL HIGH (ref 3.5–5.1)
Sodium: 128 mmol/L — ABNORMAL LOW (ref 135–145)

## 2016-07-20 LAB — RETICULOCYTES
RBC.: 2.21 MIL/uL — ABNORMAL LOW (ref 3.87–5.11)
Retic Count, Absolute: 114.9 10*3/uL (ref 19.0–186.0)
Retic Ct Pct: 5.2 % — ABNORMAL HIGH (ref 0.4–3.1)

## 2016-07-20 LAB — CBC
HCT: 22.2 % — ABNORMAL LOW (ref 36.0–46.0)
Hemoglobin: 7.1 g/dL — ABNORMAL LOW (ref 12.0–15.0)
MCH: 27.6 pg (ref 26.0–34.0)
MCHC: 32 g/dL (ref 30.0–36.0)
MCV: 86.4 fL (ref 78.0–100.0)
Platelets: 157 10*3/uL (ref 150–400)
RBC: 2.57 MIL/uL — ABNORMAL LOW (ref 3.87–5.11)
RDW: 17.8 % — ABNORMAL HIGH (ref 11.5–15.5)
WBC: 8.8 10*3/uL (ref 4.0–10.5)

## 2016-07-20 LAB — IRON AND TIBC
Iron: 14 ug/dL — ABNORMAL LOW (ref 28–170)
Saturation Ratios: 9 % — ABNORMAL LOW (ref 10.4–31.8)
TIBC: 161 ug/dL — ABNORMAL LOW (ref 250–450)
UIBC: 147 ug/dL

## 2016-07-20 LAB — FOLATE: Folate: 9.4 ng/mL (ref >5.9)

## 2016-07-20 LAB — FERRITIN: Ferritin: 237 ng/mL (ref 11–307)

## 2016-07-20 LAB — MAGNESIUM: Magnesium: 1.4 mg/dL — ABNORMAL LOW (ref 1.7–2.4)

## 2016-07-20 LAB — VITAMIN B12: Vitamin B-12: 707 pg/mL (ref 180–914)

## 2016-07-20 MED ORDER — FUROSEMIDE 10 MG/ML IJ SOLN
20.0000 mg | Freq: Once | INTRAMUSCULAR | Status: AC
  Administered 2016-07-20: 20 mg via INTRAVENOUS
  Filled 2016-07-20: qty 2

## 2016-07-20 MED ORDER — MAGNESIUM SULFATE 2 GM/50ML IV SOLN
2.0000 g | Freq: Once | INTRAVENOUS | Status: AC
  Administered 2016-07-20: 2 g via INTRAVENOUS
  Filled 2016-07-20: qty 50

## 2016-07-20 NOTE — Care Management Note (Signed)
Case Management Note Original Note Created by Fernande Boyden RN, BSN Unit 2W-Case Manager [phone]  Patient Details  Name: Erin [name] MRN: [id number] Date of Birth: [birth year]  Subjective/Objective:   Pt admitted with sepsis, AKI, plan for OR today 06/26/16                 Action/Plan: PTA pt was from home with son, recently discharged from SNF-rehab stay- uses RW. - CM to follow for d/c needs post op - per Essie Hart. RNCM- pt made HRI with [facility] if pt  to go home with [facility] services- per [facility] mtg on 06/26/16   Expected Discharge Date:                  Expected Discharge Plan:  Skilled Nursing Facility  In-House Referral:  Clinical Social Work  Discharge planning Services  CM Consult  Post Acute Care Choice:    Choice offered to:     DME Arranged:    DME Agency:     HH Arranged:    HH Agency:     Status of Service:  In process, will continue to follow  If discussed at Long Length of Stay Meetings, dates discussed:  5/22, 5/24  Discharge Disposition:   Additional Comments: 07/20/2016 Pt is now s/p extention of fusion.  Pt now with asymptomatic brady - holding lopressor, amio and cardizem.  IV lasix ordered.  CSW continuing to follow for SNF at discharge  07/13/16- 1200- Kristi Webster RN, CM- pt's stay has been complicated by afib and vol overload- s/p cardioversion. Pt had IR aspiration done 5/24 for further cultures- plan is for surgical stabilization next week ?5/30 per Dr. Wynetta Emery. CSW continues to follow for SNF when medically stable.  06/27/16- 1140- Kristi Webster rN, CM- pt s/p reexploration of the lumbar wound for irrigation and debridement and evacuation of lumbar epidural abscess on 06/26/16- on 2H post op with order to tx today-  PT/OT evals pending- CM will follow for recommendations and d/c needs.   Cherylann Parr, RN 07/20/2016, 2:16 PM

## 2016-07-20 NOTE — Progress Notes (Signed)
**  Preliminary report by tech**  Bilateral lower extremity venous duplex completed. There is no evidence of deep or superficial vein thrombosis involving the right and left lower extremities. All visualized vessels appear patent and compressible. There is no evidence of Baker's cysts bilaterally.  07/20/16 3:30 PM Olen Cordial RVT

## 2016-07-20 NOTE — Progress Notes (Signed)
[facility]    Date of Admission:  06/21/2016   Total days of antibiotics 30        Day 14 cefazolin           ID: Erin [name] is a 71 y.o. female with  complicated history of spinal stenosis s/p laminectomy On 3/7 c/b fever, worsening back pain found to have sepsis due to ecoli bacteremia and epidural abscess s/p I x D of lumbar wound and evacuation of lumbar epidural abscess on 5/8. OR cx are + pan sensitive ecoli. Aspirated new fluid at L3-L4 space on 5/24. No S/P hardware removal and stabilization surgery 5/30.  Principal Problem:   Sepsis (HCC) Active Problems:   Spinal stenosis at L4-L5 level   Dyslipidemia   Acute midline low back pain without sciatica   Hypokalemia   CAD in native artery   Hx of CABG   AKI (acute kidney injury) (HCC)   Hyponatremia   Persistent atrial fibrillation (HCC)   Spinal abscess (HCC)   Acute on chronic diastolic (congestive) heart failure (HCC)   Atrial flutter (HCC)   Normocytic anemia   Paroxysmal atrial fibrillation (HCC)   Diskitis    Subjective: Still having significant back pain but is able to sit up on edge of bed with PT. Her HR in the 60s with sBP in 110 since stopping amio and cardizem this am  Medications:  . amiodarone  200 mg Oral Daily  . aspirin EC  81 mg Oral Daily  . atorvastatin  80 mg Oral q1800  . cholecalciferol  2,000 Units Oral QPC breakfast  . cycloSPORINE  1 drop Both Eyes BID  . furosemide  40 mg Oral Daily  . levothyroxine  112 mcg Oral QAC breakfast  . lidocaine  1 patch Transdermal Q24H  . mouth rinse  15 mL Mouth Rinse BID  . multivitamin with minerals  1 tablet Oral Daily  . mupirocin ointment   Topical BID  . pantoprazole  40 mg Oral QHS  . pantoprazole (PROTONIX) IV  40 mg Intravenous QHS  . polyethylene glycol  17 g Oral BID  . potassium chloride  40 mEq Oral BID  . senna-docusate  2 tablet Oral BID  . sodium chloride flush  3 mL Intravenous Q12H  . sodium chloride  flush  3 mL Intravenous Q12H  . sodium chloride flush  3 mL Intravenous Q12H    Objective: Vital signs in last 24 hours: Temp:  [97.2 F (36.2 C)-99 F (37.2 C)] 97.2 F (36.2 C) (06/01 1158) Pulse Rate:  [37-69] 69 (06/01 1158) Resp:  [17-33] 23 (06/01 1158) BP: (89-136)/(46-69) 90/48 (06/01 1158) SpO2:  [97 %-100 %] 100 % (06/01 1158) Weight:  [177 lb 4 oz (80.4 kg)] 177 lb 4 oz (80.4 kg) (06/01 0500) Physical Exam  Constitutional:  oriented to person, place, and time. appears well-developed and well-nourished. No distress.  HENT: [place]/AT, PERRLA, no scleral icterus Mouth/Throat: Oropharynx is clear and moist. No oropharyngeal exudate.  Cardiovascular: Normal rate, regular rhythm and normal heart sounds. Exam reveals no gallop and no friction rub.  No murmur heard.  Pulmonary/Chest: Effort normal and breath sounds normal. No respiratory distress.  Bibasilar inspiratory Crackles Back : some dried blood drainage to the top of honeycomb dressing roughly 10% drain in place at superior aspect of incision. No other drainage noted. No pressure sores noted Neurological: alert and oriented to person, place, and time.  Skin: Skin is warm and  dry. No rash noted. No erythema.  Psychiatric: a normal mood and affect.  behavior is normal.    Lab Results  Recent Labs  07/19/16 0139 07/20/16 0554  WBC 9.6 8.8  HGB 8.1* 7.1*  HCT 25.7* 22.2*  NA 130* 128*  K 4.0 5.2*  CL 92* 93*  CO2 31 27  BUN 7 8  CREATININE 1.03* 1.12*   Liver Panel  Recent Labs  07/19/16 0139 07/20/16 0554  ALBUMIN 2.1* 1.9*   Lab Results  Component Value Date   ESRSEDRATE 114 (H) 07/10/2016   Lab Results  Component Value Date   CRP 16.4 (H) 07/10/2016    Microbiology: 5/30 cx x 3 - still ngtd at 48hr Studies/Results: Dg Lumbar Spine 2-3 Views  Result Date: 07/19/2016 CLINICAL DATA:  Revision of lumbosacral spinal fusion at L2-S1. EXAM: LUMBAR SPINE - 2-3 VIEW COMPARISON:  CT of the lumbar spine  performed 07/09/2016 FINDINGS: Two fluoroscopic C-arm images are provided from the OR, demonstrating lumbosacral pedicle screws. Underlying decompression is again noted. IMPRESSION: Revision of lumbosacral spinal fusion at L2-S1. Electronically Signed   By: Roanna Raider M.D.   On: 07/19/2016 05:25   Ct Angio Chest Pe W Or Wo Contrast  Result Date: 07/19/2016 CLINICAL DATA:  Elevated D-dimer.  Decreased oxygen saturation. EXAM: CT ANGIOGRAPHY CHEST WITH CONTRAST TECHNIQUE: Multidetector CT imaging of the chest was performed using the standard protocol during bolus administration of intravenous contrast. Multiplanar CT image reconstructions and MIPs were obtained to evaluate the vascular anatomy. CONTRAST:  50 mL Isovue 370 COMPARISON:  Chest radiograph 07/19/2016.  Chest CTA 05/25/2016. FINDINGS: Cardiovascular: Pulmonary arterial opacification is adequate, however motion artifact limits segmental and subsegmental evaluation in multiple regions bilaterally. There is no lobar or more central pulmonary embolus, and no convincing segmental emboli are identified within limitations of motion. A left PICC terminates in the SVC. There is mild cardiomegaly. Sequelae of prior CABG are identified, and there is three-vessel coronary artery atherosclerosis. Previously described bulging of the proximal descending thoracic aorta is not well seen on the current examination due to motion artifact and diminished contrast opacification of the aorta. Mediastinum/Nodes: No enlarged axillary, mediastinal, or hilar lymph nodes. Scattered small mediastinal lymph nodes are larger than on the prior CTA but benign/ reactive in appearance. Lungs/Pleura: Small pleural effusions, left larger than right. Evaluation of the lung parenchyma is limited by respiratory motion artifact and expiratory phase imaging. Mosaic attenuation in the upper lobes may reflect air trapping in the setting of expiratory phase imaging. Bandlike opacities in both  upper lobes and along the fissures bilaterally suggest atelectasis. There is also evidence of volume loss and atelectasis in both lower lobes. Eventration of the right hemidiaphragm is noted. Upper Abdomen: Vicarious excretion of contrast in the gallbladder. Musculoskeletal: Partially visualized drain in the posterior soft tissues of the lower thoracic spine. Prior anterior fusion in the lower cervical spine. Old L1 superior endplate compression fracture/ Schmorl's node. Thoracic spondylosis. Bilateral glenohumeral osteoarthrosis. Review of the MIP images confirms the above findings. IMPRESSION: 1. No definite segmental or more central pulmonary emboli identified. 2. Expiratory imaging with respiratory motion artifact and atelectasis bilaterally. 3. Small pleural effusions. 4.  Aortic Atherosclerosis (ICD10-I70.0). Electronically Signed   By: Sebastian Ache M.D.   On: 07/19/2016 09:37   Dg Chest Port 1 View  Result Date: 07/19/2016 CLINICAL DATA:  Postoperative shortness of breath. EXAM: PORTABLE CHEST 1 VIEW COMPARISON:  Chest radiograph Jun 29, 2016 FINDINGS: Cardiac silhouette is mildly enlarged  and unchanged. Status post median sternotomy for CABG. Catheter looped over LEFT chest, suspected to be external to the patient. LEFT PICC distal tip projects in mid superior vena cava, unchanged. Similar bronchitic changes. Increasing RIGHT midlung zone atelectasis with patchy consolidation LEFT mid and lower lung zone. Trace LEFT pleural effusion. No pneumothorax. Status post CABG. Surgical clips at GE junction. IMPRESSION: Increasing LEFT mid and lower lung zone consolidations, increasing RIGHT midlung zone atelectasis. Similar bronchitic changes. Electronically Signed   By: Awilda Metro M.D.   On: 07/19/2016 02:09   Dg C-arm 1-60 Min  Result Date: 07/19/2016 CLINICAL DATA:  Revision of lumbosacral spinal fusion at L2-S1. EXAM: LUMBAR SPINE - 2-3 VIEW COMPARISON:  CT of the lumbar spine performed 07/09/2016  FINDINGS: Two fluoroscopic C-arm images are provided from the OR, demonstrating lumbosacral pedicle screws. Underlying decompression is again noted. IMPRESSION: Revision of lumbosacral spinal fusion at L2-S1. Electronically Signed   By: Roanna Raider M.D.   On: 07/19/2016 05:25     Assessment/Plan: ecoli lumbar discitis/osteo = plan for 8 wk of IV cefazolin unless we find new pathogen. Will follow cx from 5/30. She is currently day 30 of 56. Drain is in place defer to dr cram when to remove.  Bradycardia = improved but still would recommend to continue monitoring on tele, making dose adjustment to optimize CAD meds.  afib = temporarily off of anticoagulation due to trying to minimize risk of bleeding into srugical bed. Pt is currently sinus  Anemia = had transfusion .continues to be monitored 2/2 blood loss from surgery.  Drue Second [facility] Cell: [phone] Pager: [phone]  07/20/2016, 1:43 PM

## 2016-07-20 NOTE — Progress Notes (Signed)
Informed Dr. Catha Gosselin that patinets HR is 56 BP is 103/54. Per MD hold po Lasix and amiodarone at this time. If HR increases to greater than 60 and sustains then it is ok to give the amiodarone

## 2016-07-20 NOTE — Progress Notes (Signed)
Subjective: Patient reports Doing well condition of back pain legs feel good  Objective: Vital signs in last 24 hours: Temp:  [98.1 F (36.7 C)-99 F (37.2 C)] 98.1 F (36.7 C) (06/01 0733) Pulse Rate:  [37-65] 58 (06/01 1023) Resp:  [17-33] 25 (06/01 1023) BP: (89-136)/(46-69) 103/54 (06/01 1023) SpO2:  [97 %-100 %] 100 % (06/01 1023) Weight:  [80.4 kg (177 lb 4 oz)] 80.4 kg (177 lb 4 oz) (06/01 0500)  Intake/Output from previous day: 05/31 0701 - 06/01 0700 In: 1725 [I.V.:1725] Out: 1375 [Urine:1350; Drains:25] Intake/Output this shift: Total I/O In: 177.5 [I.V.:177.5] Out: 200 [Urine:200]  Strength out of 5 wound clean dry and intact  Lab Results:  Recent Labs  07/19/16 0139 07/19/16 0302 07/20/16 0554  WBC 9.6  --  8.8  HGB 8.1*  --  7.1*  HCT 25.7*  --  22.2*  PLT 147* 150 157   BMET  Recent Labs  07/19/16 0139 07/20/16 0554  NA 130* 128*  K 4.0 5.2*  CL 92* 93*  CO2 31 27  GLUCOSE 104* 102*  BUN 7 8  CREATININE 1.03* 1.12*  CALCIUM 8.1* 7.9*    Studies/Results: Dg Lumbar Spine 2-3 Views  Result Date: 07/19/2016 CLINICAL DATA:  Revision of lumbosacral spinal fusion at L2-S1. EXAM: LUMBAR SPINE - 2-3 VIEW COMPARISON:  CT of the lumbar spine performed 07/09/2016 FINDINGS: Two fluoroscopic C-arm images are provided from the OR, demonstrating lumbosacral pedicle screws. Underlying decompression is again noted. IMPRESSION: Revision of lumbosacral spinal fusion at L2-S1. Electronically Signed   By: Roanna Raider M.D.   On: 07/19/2016 05:25   Ct Angio Chest Pe W Or Wo Contrast  Result Date: 07/19/2016 CLINICAL DATA:  Elevated D-dimer.  Decreased oxygen saturation. EXAM: CT ANGIOGRAPHY CHEST WITH CONTRAST TECHNIQUE: Multidetector CT imaging of the chest was performed using the standard protocol during bolus administration of intravenous contrast. Multiplanar CT image reconstructions and MIPs were obtained to evaluate the vascular anatomy. CONTRAST:  50 mL  Isovue 370 COMPARISON:  Chest radiograph 07/19/2016.  Chest CTA 05/25/2016. FINDINGS: Cardiovascular: Pulmonary arterial opacification is adequate, however motion artifact limits segmental and subsegmental evaluation in multiple regions bilaterally. There is no lobar or more central pulmonary embolus, and no convincing segmental emboli are identified within limitations of motion. A left PICC terminates in the SVC. There is mild cardiomegaly. Sequelae of prior CABG are identified, and there is three-vessel coronary artery atherosclerosis. Previously described bulging of the proximal descending thoracic aorta is not well seen on the current examination due to motion artifact and diminished contrast opacification of the aorta. Mediastinum/Nodes: No enlarged axillary, mediastinal, or hilar lymph nodes. Scattered small mediastinal lymph nodes are larger than on the prior CTA but benign/ reactive in appearance. Lungs/Pleura: Small pleural effusions, left larger than right. Evaluation of the lung parenchyma is limited by respiratory motion artifact and expiratory phase imaging. Mosaic attenuation in the upper lobes may reflect air trapping in the setting of expiratory phase imaging. Bandlike opacities in both upper lobes and along the fissures bilaterally suggest atelectasis. There is also evidence of volume loss and atelectasis in both lower lobes. Eventration of the right hemidiaphragm is noted. Upper Abdomen: Vicarious excretion of contrast in the gallbladder. Musculoskeletal: Partially visualized drain in the posterior soft tissues of the lower thoracic spine. Prior anterior fusion in the lower cervical spine. Old L1 superior endplate compression fracture/ Schmorl's node. Thoracic spondylosis. Bilateral glenohumeral osteoarthrosis. Review of the MIP images confirms the above findings. IMPRESSION: 1. No  definite segmental or more central pulmonary emboli identified. 2. Expiratory imaging with respiratory motion artifact  and atelectasis bilaterally. 3. Small pleural effusions. 4.  Aortic Atherosclerosis (ICD10-I70.0). Electronically Signed   By: Sebastian Ache M.D.   On: 07/19/2016 09:37   Dg Chest Port 1 View  Result Date: 07/19/2016 CLINICAL DATA:  Postoperative shortness of breath. EXAM: PORTABLE CHEST 1 VIEW COMPARISON:  Chest radiograph Jun 29, 2016 FINDINGS: Cardiac silhouette is mildly enlarged and unchanged. Status post median sternotomy for CABG. Catheter looped over LEFT chest, suspected to be external to the patient. LEFT PICC distal tip projects in mid superior vena cava, unchanged. Similar bronchitic changes. Increasing RIGHT midlung zone atelectasis with patchy consolidation LEFT mid and lower lung zone. Trace LEFT pleural effusion. No pneumothorax. Status post CABG. Surgical clips at GE junction. IMPRESSION: Increasing LEFT mid and lower lung zone consolidations, increasing RIGHT midlung zone atelectasis. Similar bronchitic changes. Electronically Signed   By: Awilda Metro M.D.   On: 07/19/2016 02:09   Dg C-arm 1-60 Min  Result Date: 07/19/2016 CLINICAL DATA:  Revision of lumbosacral spinal fusion at L2-S1. EXAM: LUMBAR SPINE - 2-3 VIEW COMPARISON:  CT of the lumbar spine performed 07/09/2016 FINDINGS: Two fluoroscopic C-arm images are provided from the OR, demonstrating lumbosacral pedicle screws. Underlying decompression is again noted. IMPRESSION: Revision of lumbosacral spinal fusion at L2-S1. Electronically Signed   By: Roanna Raider M.D.   On: 07/19/2016 05:25    Assessment/Plan: Continue to mobilize with physical and occupational therapy in her brace I discontinued all IV pain medications I think she can be managed on Vicodin only.     Erin [name] P 07/20/2016, 12:13 PM

## 2016-07-20 NOTE — Clinical Social Work Note (Signed)
CSW continuing to follow pt for support and disposition needs. Pt will discharge to [facility] when medically stable.   Corlis Hove, LCSWA, LCASA Clinical Social Work [phone]
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
## 2016-07-20 NOTE — Addendum Note (Signed)
Addendum  created 07/20/16 1232 by Chester Sibert D, MD   Sign clinical note    

## 2016-07-21 DIAGNOSIS — E875 Hyperkalemia: Secondary | ICD-10-CM

## 2016-07-21 LAB — RENAL FUNCTION PANEL
Albumin: 1.7 g/dL — ABNORMAL LOW (ref 3.5–5.0)
Anion gap: 6 (ref 5–15)
BUN: 12 mg/dL (ref 6–20)
CO2: 30 mmol/L (ref 22–32)
Calcium: 7.7 mg/dL — ABNORMAL LOW (ref 8.9–10.3)
Chloride: 93 mmol/L — ABNORMAL LOW (ref 101–111)
Creatinine, Ser: 1.19 mg/dL — ABNORMAL HIGH (ref 0.44–1.00)
GFR calc Af Amer: 52 mL/min — ABNORMAL LOW (ref >60)
GFR calc non Af Amer: 44 mL/min — ABNORMAL LOW (ref >60)
Glucose, Bld: 122 mg/dL — ABNORMAL HIGH (ref 65–99)
Phosphorus: 2.1 mg/dL — ABNORMAL LOW (ref 2.5–4.6)
Potassium: 4.7 mmol/L (ref 3.5–5.1)
Sodium: 129 mmol/L — ABNORMAL LOW (ref 135–145)

## 2016-07-21 LAB — CBC
HCT: 19.4 % — ABNORMAL LOW (ref 36.0–46.0)
Hemoglobin: 6.1 g/dL — CL (ref 12.0–15.0)
MCH: 27 pg (ref 26.0–34.0)
MCHC: 31.4 g/dL (ref 30.0–36.0)
MCV: 85.8 fL (ref 78.0–100.0)
Platelets: 135 10*3/uL — ABNORMAL LOW (ref 150–400)
RBC: 2.26 MIL/uL — ABNORMAL LOW (ref 3.87–5.11)
RDW: 18.2 % — ABNORMAL HIGH (ref 11.5–15.5)
WBC: 6.8 10*3/uL (ref 4.0–10.5)

## 2016-07-21 LAB — HEMOGLOBIN AND HEMATOCRIT, BLOOD
HCT: 25.2 % — ABNORMAL LOW (ref 36.0–46.0)
Hemoglobin: 8 g/dL — ABNORMAL LOW (ref 12.0–15.0)

## 2016-07-21 LAB — PREPARE RBC (CROSSMATCH)

## 2016-07-21 LAB — MAGNESIUM: Magnesium: 1.9 mg/dL (ref 1.7–2.4)

## 2016-07-21 MED ORDER — FERROUS SULFATE 325 (65 FE) MG PO TABS
325.0000 mg | ORAL_TABLET | Freq: Two times a day (BID) | ORAL | Status: DC
  Administered 2016-07-21 – 2016-08-01 (×22): 325 mg via ORAL
  Filled 2016-07-21 (×23): qty 1

## 2016-07-21 MED ORDER — SODIUM CHLORIDE 0.9 % IV SOLN: Freq: Once | INTRAVENOUS | Status: DC
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
## 2016-07-21 NOTE — Progress Notes (Signed)
Pt transferred from bed to bedside chair w/assist of one.  Assisted to stand by one RN; tood at bedside, turned and sat in chair almost independently.  Sternal precautions followed w/cues.  PRBC continues to infuse per PICC.  VSS.

## 2016-07-21 NOTE — Progress Notes (Addendum)
CRITICAL VALUE ALERT  Critical Value:  Hgb 6.1  Date & Time Notied:  07/21/16 3:42 AM  Provider Notified:  M. Lynch  Orders Received/Actions taken: recv'd orders to transfuse 1u pRBCs

## 2016-07-21 NOTE — Progress Notes (Signed)
No acute events Feels well Drain output 130 Moving legs with full strength Incision c/d/i Stable Continue current care Keep drain for now

## 2016-07-22 DIAGNOSIS — D696 Thrombocytopenia, unspecified: Secondary | ICD-10-CM

## 2016-07-22 LAB — CBC
HCT: 22.9 % — ABNORMAL LOW (ref 36.0–46.0)
Hemoglobin: 7.4 g/dL — ABNORMAL LOW (ref 12.0–15.0)
MCH: 27.7 pg (ref 26.0–34.0)
MCHC: 32.3 g/dL (ref 30.0–36.0)
MCV: 85.8 fL (ref 78.0–100.0)
Platelets: 150 10*3/uL (ref 150–400)
RBC: 2.67 MIL/uL — ABNORMAL LOW (ref 3.87–5.11)
RDW: 17.6 % — ABNORMAL HIGH (ref 11.5–15.5)
WBC: 6 10*3/uL (ref 4.0–10.5)

## 2016-07-22 LAB — RENAL FUNCTION PANEL
Albumin: 1.6 g/dL — ABNORMAL LOW (ref 3.5–5.0)
Anion gap: 9 (ref 5–15)
BUN: 7 mg/dL (ref 6–20)
CO2: 28 mmol/L (ref 22–32)
Calcium: 7.6 mg/dL — ABNORMAL LOW (ref 8.9–10.3)
Chloride: 92 mmol/L — ABNORMAL LOW (ref 101–111)
Creatinine, Ser: 1.06 mg/dL — ABNORMAL HIGH (ref 0.44–1.00)
GFR calc Af Amer: 59 mL/min — ABNORMAL LOW (ref >60)
GFR calc non Af Amer: 51 mL/min — ABNORMAL LOW (ref >60)
Glucose, Bld: 105 mg/dL — ABNORMAL HIGH (ref 65–99)
Phosphorus: 2.2 mg/dL — ABNORMAL LOW (ref 2.5–4.6)
Potassium: 3.7 mmol/L (ref 3.5–5.1)
Sodium: 129 mmol/L — ABNORMAL LOW (ref 135–145)

## 2016-07-22 LAB — MAGNESIUM: Magnesium: 1.6 mg/dL — ABNORMAL LOW (ref 1.7–2.4)

## 2016-07-22 LAB — PREPARE RBC (CROSSMATCH)

## 2016-07-22 MED ORDER — SODIUM CHLORIDE 0.9 % IV SOLN
Freq: Once | INTRAVENOUS | Status: AC
  Administered 2016-07-22: 11:00:00 via INTRAVENOUS

## 2016-07-22 MED ORDER — POTASSIUM CHLORIDE CRYS ER 20 MEQ PO TBCR
20.0000 meq | EXTENDED_RELEASE_TABLET | Freq: Once | ORAL | Status: AC
  Administered 2016-07-22: 20 meq via ORAL
  Filled 2016-07-22: qty 1

## 2016-07-22 MED ORDER — KETOROLAC TROMETHAMINE 15 MG/ML IJ SOLN
15.0000 mg | Freq: Four times a day (QID) | INTRAMUSCULAR | Status: AC | PRN
  Administered 2016-07-22 – 2016-07-27 (×10): 15 mg via INTRAVENOUS
  Filled 2016-07-22 (×11): qty 1

## 2016-07-22 NOTE — Progress Notes (Signed)
Pt seen and examined.  Bleeding from drain tubing site overnight with minimal drainage into drain itself No concerns.  EXAM: Temp:  [98 F (36.7 C)-98.5 F (36.9 C)] 98 F (36.7 C) (06/03 0408) Pulse Rate:  [72-90] 81 (06/03 0408) Resp:  [18-19] 18 (06/03 0408) BP: (116-155)/(62-81) 142/81 (06/03 0408) SpO2:  [97 %-100 %] 100 % (06/03 0408) Weight:  [80.3 kg (177 lb 0.5 oz)] 80.3 kg (177 lb 0.5 oz) (06/03 0408) Intake/Output      06/02 0701 - 06/03 0700 06/03 0701 - 06/04 0700   P.O.     I.V. (mL/kg) 10 (0.1)    Blood 352    IV Piggyback     Total Intake(mL/kg) 362 (4.5)    Urine (mL/kg/hr) 700 (0.4)    Drains 75 (0)    Total Output 775     Net -413          Stool Occurrence 1 x     Awake and alert Follows commands throughout MAEW Incision: c/d/i Drain tube site: gauze soaked with blood. Removal reveals no active bleeding this morning  Stable. Continue current care Changed bandage Likely pull drain tomorrow if remains same output

## 2016-07-22 NOTE — Progress Notes (Signed)
[facility]    Date of Admission:  06/21/2016   Total days of antibiotics 32        Day 16 cefazolin           ID: Erin [name] is a 71 y.o. female with  complicated history of spinal stenosis s/p laminectomy On 3/7 c/b fever, worsening back pain found to have sepsis due to ecoli bacteremia and epidural abscess s/p I x D of lumbar wound and evacuation of lumbar epidural abscess on 5/8. OR cx are + pan sensitive ecoli. Aspirated new fluid at L3-L4 space on 5/24. No S/P hardware removal and stabilization surgery 5/30.  Principal Problem:   Sepsis (HCC) Active Problems:   Spinal stenosis at L4-L5 level   Dyslipidemia   Acute midline low back pain without sciatica   Hypokalemia   CAD in native artery   Hx of CABG   AKI (acute kidney injury) (HCC)   Hyponatremia   Persistent atrial fibrillation (HCC)   Spinal abscess (HCC)   Acute on chronic diastolic (congestive) heart failure (HCC)   Atrial flutter (HCC)   Normocytic anemia   Paroxysmal atrial fibrillation (HCC)   Diskitis    Subjective: Back pain slightly improved. Has accordian drain in place still. Denies fever, chills, HR now within normal limits. Still gets dizzy once she sits up.  Medications:  . amiodarone  200 mg Oral Daily  . aspirin EC  81 mg Oral Daily  . atorvastatin  80 mg Oral q1800  . cholecalciferol  2,000 Units Oral QPC breakfast  . cycloSPORINE  1 drop Both Eyes BID  . ferrous sulfate  325 mg Oral BID WC  . furosemide  40 mg Oral Daily  . levothyroxine  112 mcg Oral QAC breakfast  . lidocaine  1 patch Transdermal Q24H  . mouth rinse  15 mL Mouth Rinse BID  . multivitamin with minerals  1 tablet Oral Daily  . mupirocin ointment   Topical BID  . pantoprazole  40 mg Oral QHS  . pantoprazole (PROTONIX) IV  40 mg Intravenous QHS  . polyethylene glycol  17 g Oral BID  . potassium chloride  20 mEq Oral Once  . senna-docusate  2 tablet Oral BID  . sodium chloride flush  3 mL  Intravenous Q12H  . sodium chloride flush  3 mL Intravenous Q12H  . sodium chloride flush  3 mL Intravenous Q12H    Objective: Vital signs in last 24 hours: Temp:  [97.8 F (36.6 C)-98.3 F (36.8 C)] (P) 97.8 F (36.6 C) (06/03 1120) Pulse Rate:  [75-90] (P) 77 (06/03 1120) Resp:  [16-18] (P) 16 (06/03 1120) BP: (123-155)/(63-81) (P) 134/72 (06/03 1120) SpO2:  [97 %-100 %] (P) 98 % (06/03 1120) Weight:  [177 lb 0.5 oz (80.3 kg)] 177 lb 0.5 oz (80.3 kg) (06/03 0408) Physical Exam  Constitutional:  oriented to person, place, and time. appears well-developed and well-nourished. No distress.  HENT: [place]/AT, PERRLA, no scleral icterus Mouth/Throat: Oropharynx is clear and moist. No oropharyngeal exudate.  Cardiovascular: Normal rate, regular rhythm and normal heart sounds. Exam reveals no gallop and no friction rub.  No murmur heard.  Pulmonary/Chest: Effort normal and breath sounds normal. No respiratory distress.  Bibasilar inspiratory Crackles Back : new dressing in place. No other drainage noted. accordian drain in place Skin: multiple ecchymosis to forearms and hands Psychiatric: a normal mood and affect.  behavior is normal.    Lab Results  Recent  Labs  07/21/16 0252 07/21/16 1945 07/22/16 0415  WBC 6.8  --  6.0  HGB 6.1* 8.0* 7.4*  HCT 19.4* 25.2* 22.9*  NA 129*  --  129*  K 4.7  --  3.7  CL 93*  --  92*  CO2 30  --  28  BUN 12  --  7  CREATININE 1.19*  --  1.06*   Liver Panel  Recent Labs  07/21/16 0252 07/22/16 0415  ALBUMIN 1.7* 1.6*   Lab Results  Component Value Date   ESRSEDRATE 114 (H) 07/10/2016   Lab Results  Component Value Date   CRP 16.4 (H) 07/10/2016    Microbiology: 5/30 cx x 3 - still ngtd at 4 days Studies/Results: No results found.   Assessment/Plan: ecoli lumbar discitis/osteo = plan for 8 wk of IV cefazolin unless we find new pathogen. Will follow cx from 5/30. She is currently day 32 of 56. Will likely need oral abtx for  suppression thereafter. Drain is in place defer to dr cram when to remove. Will check sed rate and crp tomorrow.  Bradycardia = improved but sounds like she has postural hypotension.  afib = on amio 200mg  daily.  Anemia = receiving 2 addn units of blood today. May need to consider why she is requiring more units this far out from her surgery vs. Oozing around drain. Wonder if there is potential for ? Retroperitoneal blood if she needs further rbc tsf.  Will sign off. We will arrange for follow up appt at [facility]. For now plan to keep treatment plan as stated above.  Drue Second [facility] Cell: [phone] Pager: [phone]  07/22/2016, 12:13 PM
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
## 2016-07-23 ENCOUNTER — Inpatient Hospital Stay (HOSPITAL_COMMUNITY): Payer: Medicare HMO

## 2016-07-23 LAB — TYPE AND SCREEN
ABO/RH(D): O POS
Antibody Screen: NEGATIVE
Unit division: 0
Unit division: 0

## 2016-07-23 LAB — BPAM RBC
Blood Product Expiration Date: 201806262359
Blood Product Expiration Date: 201806262359
ISSUE DATE / TIME: 201806021030
ISSUE DATE / TIME: 201806031208
Unit Type and Rh: 5100
Unit Type and Rh: 5100

## 2016-07-23 LAB — CBC
HCT: 29.2 % — ABNORMAL LOW (ref 36.0–46.0)
Hemoglobin: 9.5 g/dL — ABNORMAL LOW (ref 12.0–15.0)
MCH: 28.3 pg (ref 26.0–34.0)
MCHC: 32.5 g/dL (ref 30.0–36.0)
MCV: 86.9 fL (ref 78.0–100.0)
Platelets: 163 10*3/uL (ref 150–400)
RBC: 3.36 MIL/uL — ABNORMAL LOW (ref 3.87–5.11)
RDW: 16.9 % — ABNORMAL HIGH (ref 11.5–15.5)
WBC: 7.6 10*3/uL (ref 4.0–10.5)

## 2016-07-23 LAB — MAGNESIUM: Magnesium: 1.5 mg/dL — ABNORMAL LOW (ref 1.7–2.4)

## 2016-07-23 LAB — RENAL FUNCTION PANEL
Albumin: 1.8 g/dL — ABNORMAL LOW (ref 3.5–5.0)
Anion gap: 8 (ref 5–15)
BUN: 6 mg/dL (ref 6–20)
CO2: 27 mmol/L (ref 22–32)
Calcium: 7.9 mg/dL — ABNORMAL LOW (ref 8.9–10.3)
Chloride: 95 mmol/L — ABNORMAL LOW (ref 101–111)
Creatinine, Ser: 0.91 mg/dL (ref 0.44–1.00)
GFR calc Af Amer: >60 mL/min (ref >60)
GFR calc non Af Amer: >60 mL/min (ref >60)
Glucose, Bld: 106 mg/dL — ABNORMAL HIGH (ref 65–99)
Phosphorus: 2.3 mg/dL — ABNORMAL LOW (ref 2.5–4.6)
Potassium: 3.6 mmol/L (ref 3.5–5.1)
Sodium: 130 mmol/L — ABNORMAL LOW (ref 135–145)

## 2016-07-23 MED ORDER — DILTIAZEM HCL 30 MG PO TABS
30.0000 mg | ORAL_TABLET | Freq: Four times a day (QID) | ORAL | Status: DC
  Administered 2016-07-23 – 2016-07-25 (×9): 30 mg via ORAL
  Filled 2016-07-23 (×9): qty 1

## 2016-07-23 MED ORDER — APIXABAN 5 MG PO TABS: 5.0000 mg | ORAL_TABLET | Freq: Two times a day (BID) | ORAL | Status: DC

## 2016-07-23 MED ORDER — MAGNESIUM SULFATE 2 GM/50ML IV SOLN
2.0000 g | Freq: Once | INTRAVENOUS | Status: AC
  Administered 2016-07-23: 2 g via INTRAVENOUS
  Filled 2016-07-23: qty 50

## 2016-07-23 MED ORDER — POTASSIUM CHLORIDE CRYS ER 20 MEQ PO TBCR
40.0000 meq | EXTENDED_RELEASE_TABLET | Freq: Once | ORAL | Status: AC
  Administered 2016-07-23: 40 meq via ORAL
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
## 2016-07-23 NOTE — Clinical Social Work Note (Signed)
Per MD, patient will likely be stable for discharge tomorrow. CSW notified admissions coordinator at SNF. She will start insurance authorization when patient is definitely discharging because they have gotten it several times since she has been here and patient did not discharge.  Charlynn Court, CSW [phone]

## 2016-07-23 NOTE — Progress Notes (Signed)
CCMD notified this RN @2330  that this patient converted to rapid afib with RVR, heart rate 143. Patient stated that, "her heart was pounding and woke her up". Patient also stated that, 'she broke out in a cold sweat which usually happens when she has one of these spells".  EKG done showing afib RVR. EKG placed in chart. 5 ml metoprolol given @2343 . Patient's heart rate continues to be afib in the 110's. Notified on call MD for [facility]. Waiting for return call. Will continue to monitor

## 2016-07-23 NOTE — Care Management Important Message (Signed)
Important Message  Patient Details  Name: Erin [name] MRN: [id number] Date of Birth: [birth year]   Medicare Important Message Given:  Yes    [name] 07/23/2016, 12:34 PM
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
## 2016-07-24 ENCOUNTER — Encounter: Payer: Self-pay | Admitting: Thoracic Surgery (Cardiothoracic Vascular Surgery)

## 2016-07-24 DIAGNOSIS — G8918 Other acute postprocedural pain: Secondary | ICD-10-CM

## 2016-07-24 DIAGNOSIS — M48061 Spinal stenosis, lumbar region without neurogenic claudication: Secondary | ICD-10-CM

## 2016-07-24 DIAGNOSIS — G062 Extradural and subdural abscess, unspecified: Secondary | ICD-10-CM

## 2016-07-24 DIAGNOSIS — T888XXD Other specified complications of surgical and medical care, not elsewhere classified, subsequent encounter: Secondary | ICD-10-CM

## 2016-07-24 DIAGNOSIS — M4646 Discitis, unspecified, lumbar region: Secondary | ICD-10-CM

## 2016-07-24 DIAGNOSIS — I4891 Unspecified atrial fibrillation: Secondary | ICD-10-CM

## 2016-07-24 DIAGNOSIS — M545 Low back pain, unspecified: Secondary | ICD-10-CM

## 2016-07-24 DIAGNOSIS — T888XXA Other specified complications of surgical and medical care, not elsewhere classified, initial encounter: Secondary | ICD-10-CM

## 2016-07-24 DIAGNOSIS — D62 Acute posthemorrhagic anemia: Secondary | ICD-10-CM

## 2016-07-24 LAB — AEROBIC/ANAEROBIC CULTURE W GRAM STAIN (SURGICAL/DEEP WOUND): Culture: NO GROWTH

## 2016-07-24 LAB — RENAL FUNCTION PANEL
Albumin: 1.9 g/dL — ABNORMAL LOW (ref 3.5–5.0)
Anion gap: 9 (ref 5–15)
BUN: <5 mg/dL — ABNORMAL LOW (ref 6–20)
CO2: 30 mmol/L (ref 22–32)
Calcium: 7.9 mg/dL — ABNORMAL LOW (ref 8.9–10.3)
Chloride: 90 mmol/L — ABNORMAL LOW (ref 101–111)
Creatinine, Ser: 0.89 mg/dL (ref 0.44–1.00)
GFR calc Af Amer: >60 mL/min (ref >60)
GFR calc non Af Amer: >60 mL/min (ref >60)
Glucose, Bld: 111 mg/dL — ABNORMAL HIGH (ref 65–99)
Phosphorus: 2.6 mg/dL (ref 2.5–4.6)
Potassium: 3.5 mmol/L (ref 3.5–5.1)
Sodium: 129 mmol/L — ABNORMAL LOW (ref 135–145)

## 2016-07-24 LAB — CBC
HCT: 30 % — ABNORMAL LOW (ref 36.0–46.0)
Hemoglobin: 9.6 g/dL — ABNORMAL LOW (ref 12.0–15.0)
MCH: 27.7 pg (ref 26.0–34.0)
MCHC: 32 g/dL (ref 30.0–36.0)
MCV: 86.5 fL (ref 78.0–100.0)
Platelets: 160 10*3/uL (ref 150–400)
RBC: 3.47 MIL/uL — ABNORMAL LOW (ref 3.87–5.11)
RDW: 17.1 % — ABNORMAL HIGH (ref 11.5–15.5)
WBC: 7.9 10*3/uL (ref 4.0–10.5)

## 2016-07-24 LAB — AEROBIC/ANAEROBIC CULTURE (SURGICAL/DEEP WOUND)
Culture: NO GROWTH
Culture: NO GROWTH

## 2016-07-24 LAB — MAGNESIUM: Magnesium: 1.7 mg/dL (ref 1.7–2.4)

## 2016-07-24 MED ORDER — POTASSIUM CHLORIDE CRYS ER 20 MEQ PO TBCR
40.0000 meq | EXTENDED_RELEASE_TABLET | Freq: Once | ORAL | Status: AC
  Administered 2016-07-24: 40 meq via ORAL
  Filled 2016-07-24: qty 2

## 2016-07-24 MED ORDER — APIXABAN 5 MG PO TABS
5.0000 mg | ORAL_TABLET | Freq: Two times a day (BID) | ORAL | Status: DC
  Administered 2016-07-24 – 2016-08-01 (×17): 5 mg via ORAL
  Filled 2016-07-24 (×18): qty 1

## 2016-07-24 MED ORDER — MAGNESIUM CHLORIDE 64 MG PO TBEC
1.0000 | DELAYED_RELEASE_TABLET | Freq: Every day | ORAL | Status: DC
  Administered 2016-07-24 – 2016-07-31 (×8): 64 mg via ORAL
  Filled 2016-07-24 (×8): qty 1

## 2016-07-24 MED ORDER — SODIUM CHLORIDE 0.9% FLUSH
10.0000 mL | INTRAVENOUS | Status: DC | PRN
  Administered 2016-07-24 – 2016-08-01 (×6): 10 mL
  Filled 2016-07-24 (×5): qty 40

## 2016-07-24 NOTE — Progress Notes (Signed)
Patient ID: MAKHYLA ROMINE, female   DOB: 1945/10/14, 71 y.o.   MRN: [id number] Evelisse was doing better this morning condition of back pain no leg pain still baseline weakness proximally right lower extremity at about 4 to 4+ out of 5   incision clean dry and intact   continue to mobilize physical and occupational therapy it is okay with me for her to transfer to rehab both inpatient at [facility] or skilled nursing facility.  Okay to remove the outer dressing when her decubitus stressing is changed.  Leave the Steri-Strips on and intact.  Continue to mobilize in the brace I would like her to wear brace whenever she is sitting or ambulating.  I think the vast majority of her proximal lower extremity weakness represents more iliopsoas inflammation from the infection CT scan last night showed good position of all the hardware and implants no evidence of retroperitoneal hematoma.  Hematocrit seem stable overnight.  I will be out of town the remainder of this week will be back in town on Monday.  For any concerns or questions please contact the office at [phone] extension [phone]

## 2016-07-24 NOTE — Clinical Social Work Note (Signed)
Per RNCM, no beds available at [facility] for the next 2 days. CSW proceeding with SNF placement. CSW spoke with Marcelino Duster at [facility], who confirmed bed offer and has started insurance auth today. Hopeful insurance Berkley Harvey is recevied tomorrow for likely discharge tomorrow. CSW continuing to follow for family support and discharge needs.   Corlis Hove, LCSWA, LCASA Clinical Social Work [phone]
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
## 2016-07-24 NOTE — Progress Notes (Signed)
Rehab admissions - I met with patient and I spoke with her son, Sherren Mocha, by phone.  Currently we have no open rehab beds with only limited bed availability this week.  Per case manager, patient is medically ready for discharge.  Son is planning on patient going to [facility] in [place] since she has been there previously.  Recommend pursuit of SNF since we have no rehab beds available today and likely will not have a rehab bed open tomorrow.  Call me for questions.  #[phone]
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
## 2016-07-24 NOTE — Progress Notes (Signed)
ANTICOAGULATION CONSULT NOTE - Follow Up Consult  Pharmacy Consult for Eliquis Indication: atrial fibrillation  Labs:  Recent Labs  07/22/16 0415 07/23/16 0312 07/24/16 0409  HGB 7.4* 9.5* 9.6*  HCT 22.9* 29.2* 30.0*  PLT 150 163 160  CREATININE 1.06* 0.91 0.89    Assessment/Plan:  70yo female now cleared to resume anticoagulation for Afib.  Will resume Eliquis 5mg  PO BID.  Pt education done by pharmacist on 5/18.  Vernard Gambles, PharmD, BCPS  07/24/2016,7:34 AM

## 2016-07-24 NOTE — Care Management Note (Signed)
Case Management Note Original Note Created by Fernande Boyden RN, BSN Unit 2W-Case Manager [phone]  Patient Details  Name: Erin [name] MRN: [id number] Date of Birth: [birth year]  Subjective/Objective:   Pt admitted with sepsis, AKI, plan for OR today 06/26/16                 Action/Plan: PTA pt was from home with son, recently discharged from SNF-rehab stay- uses RW. - CM to follow for d/c needs post op - per Essie Hart. RNCM- pt made HRI with [facility] if pt  to go home with [facility] services- per [facility] mtg on 06/26/16   Expected Discharge Date:                  Expected Discharge Plan:  Skilled Nursing Facility  In-House Referral:  Clinical Social Work  Discharge planning Services  CM Consult  Post Acute Care Choice:    Choice offered to:     DME Arranged:    DME Agency:     HH Arranged:    HH Agency:     Status of Service:  Completed, signed off  If discussed at [employer] of Stay Meetings, dates discussed:  5/22, 5/24  Discharge Disposition:   Additional Comments:  07/24/16- 1500- Keryl Gholson RN, CM- CIR consulted for possible admission- have spoken with Gen[facility] at [facility]- consult completed however CIR does not have bed availability currently and per MD pt is medically stable for discharge- discussed this with pt at bedside along with CSW [place]- back up plan per pt and son is [facility]- CSW will proceed with SNF plan for d/c- plan for d/c 07/25/16.   Cherylann Parr, RN 07/20/2016, 2:16 PM---Pt is now s/p extention of fusion.  Pt now with asymptomatic brady - holding lopressor, amio and cardizem.  IV lasix ordered.  CSW continuing to follow for SNF at discharge  07/13/16- 1200- Panfilo Ketchum RN, CM- pt's stay has been complicated by afib and vol overload- s/p cardioversion. Pt had IR aspiration done 5/24 for further cultures- plan is for surgical stabilization next week ?5/30 per Dr. Wynetta Emery. CSW continues to follow for SNF when medically stable.  06/27/16-  1140- Kenry Daubert rN, CM- pt s/p reexploration of the lumbar wound for irrigation and debridement and evacuation of lumbar epidural abscess on 06/26/16- on 2H post op with order to tx today-  PT/OT evals pending- CM will follow for recommendations and d/c needs.   Darrold Span, RN 07/24/2016, 4:28 PM

## 2016-07-24 NOTE — Progress Notes (Signed)
PRN metoprolol given per order for HR sustained greater than 120 for 20 mins.  Pt sitting in chair, experiencing some concern over discharge planning.  Will con't to monitor.

## 2016-07-25 LAB — CBC
HCT: 29.5 % — ABNORMAL LOW (ref 36.0–46.0)
Hemoglobin: 9.5 g/dL — ABNORMAL LOW (ref 12.0–15.0)
MCH: 27.9 pg (ref 26.0–34.0)
MCHC: 32.2 g/dL (ref 30.0–36.0)
MCV: 86.5 fL (ref 78.0–100.0)
Platelets: 156 10*3/uL (ref 150–400)
RBC: 3.41 MIL/uL — ABNORMAL LOW (ref 3.87–5.11)
RDW: 17.1 % — ABNORMAL HIGH (ref 11.5–15.5)
WBC: 8.6 10*3/uL (ref 4.0–10.5)

## 2016-07-25 LAB — BASIC METABOLIC PANEL
Anion gap: 9 (ref 5–15)
BUN: 5 mg/dL — ABNORMAL LOW (ref 6–20)
CO2: 30 mmol/L (ref 22–32)
Calcium: 8.1 mg/dL — ABNORMAL LOW (ref 8.9–10.3)
Chloride: 91 mmol/L — ABNORMAL LOW (ref 101–111)
Creatinine, Ser: 0.82 mg/dL (ref 0.44–1.00)
GFR calc Af Amer: >60 mL/min (ref >60)
GFR calc non Af Amer: >60 mL/min (ref >60)
Glucose, Bld: 104 mg/dL — ABNORMAL HIGH (ref 65–99)
Potassium: 3.5 mmol/L (ref 3.5–5.1)
Sodium: 130 mmol/L — ABNORMAL LOW (ref 135–145)

## 2016-07-25 LAB — MAGNESIUM: Magnesium: 1.5 mg/dL — ABNORMAL LOW (ref 1.7–2.4)

## 2016-07-25 MED ORDER — AMIODARONE HCL IN DEXTROSE 360-4.14 MG/200ML-% IV SOLN
60.0000 mg/h | INTRAVENOUS | Status: AC
  Administered 2016-07-25 (×2): 60 mg/h via INTRAVENOUS
  Filled 2016-07-25 (×2): qty 200

## 2016-07-25 MED ORDER — MAGNESIUM SULFATE 2 GM/50ML IV SOLN
2.0000 g | Freq: Once | INTRAVENOUS | Status: AC
  Administered 2016-07-25: 2 g via INTRAVENOUS
  Filled 2016-07-25: qty 50

## 2016-07-25 MED ORDER — AMIODARONE LOAD VIA INFUSION
150.0000 mg | Freq: Once | INTRAVENOUS | Status: AC
  Administered 2016-07-25: 150 mg via INTRAVENOUS
  Filled 2016-07-25: qty 83.34

## 2016-07-25 MED ORDER — DILTIAZEM HCL 60 MG PO TABS
60.0000 mg | ORAL_TABLET | Freq: Four times a day (QID) | ORAL | Status: DC
  Administered 2016-07-25 – 2016-08-01 (×27): 60 mg via ORAL
  Filled 2016-07-25 (×27): qty 1

## 2016-07-25 MED ORDER — AMIODARONE HCL IN DEXTROSE 360-4.14 MG/200ML-% IV SOLN
30.0000 mg/h | INTRAVENOUS | Status: DC
  Administered 2016-07-25 – 2016-07-30 (×12): 30 mg/h via INTRAVENOUS
  Filled 2016-07-25 (×11): qty 200

## 2016-07-25 MED ORDER — POTASSIUM CHLORIDE CRYS ER 20 MEQ PO TBCR
40.0000 meq | EXTENDED_RELEASE_TABLET | Freq: Once | ORAL | Status: AC
  Administered 2016-07-25: 40 meq via ORAL
  Filled 2016-07-25: qty 2

## 2016-07-25 NOTE — Consult Note (Signed)
Cardiology Consultation:   Patient ID: Erin [name]; [id number]; 01/10/1946   Admit date: 06/21/2016 Date of Consult: 07/25/2016  Primary Care Provider: Caffie Damme, MD Primary Cardiologist: Tresa Endo Primary Electrophysiologist:  None   Patient Profile:   Erin [name] is a 71 y.o. female with a hx of CAD/CABG and PAF who is being seen today for the evaluation of atrial flutter  at the request of Dr Cena Benton.  History of Present Illness:   Erin [name] 71 y.o. recent CABG by Dr Dorris Fetch 05/30/16 with LAA clipping. Recent course complicated by Ecoli spesis and paravertebral abscess post lumbar laminectomy in March with revision by Dr Wynetta Emery. 06/26/16.  She is back on Eliquis PAF being Rx with cardizem and amiodarone orally low dose. I did a TEE cardioversion on her 07/04/16 and there was thrombus in her LAA but clip was intact with no communication to her LA and no other thrombus. She has gone back into rapid flutter rates 120. She is symptomatic with palpitations. No chest pain. EF 40-45% with moderate MR She is to wear a back brace and has some LE weakness from surgery and ? Iliopsoas inflammation from infection CT 6/4 with no retroperitoneal hematoma. Hct stable but low at 29.5 PLT 156   Past Medical History:  Diagnosis Date  . Arthritis    "all my back is eat up w/it; knees too" (05/24/2016)  . CAD (coronary artery disease)    s/p CABG  . Chronic bronchitis (HCC)   . Chronic lower back pain   . Dyspnea    "since OR 04/2016" (05/24/2016)  . Family history of adverse reaction to anesthesia    "daughter gets PONV" (05/24/2016)  . GERD (gastroesophageal reflux disease)    occ  . High cholesterol   . History of stomach ulcers   . Hypertension   . Hypothyroidism   . Migraine    "usually have one monthly; nothing since 04/25/2016)  . Pneumonia ~ 2002    Past Surgical History:  Procedure Laterality Date  . ANTERIOR CERVICAL DECOMP/DISCECTOMY FUSION  ~ 2009  . BACK SURGERY    . CARDIOVERSION  N/A 07/04/2016   Procedure: CARDIOVERSION;  Surgeon: Wendall Stade, MD;  Location: [facility] ENDOSCOPY;  Service: Cardiovascular;  Laterality: N/A;  . CARPAL TUNNEL RELEASE Bilateral 80's  . CORONARY ARTERY BYPASS GRAFT N/A 05/30/2016   Procedure: CORONARY ARTERY BYPASS GRAFTING (CABG), ON PUMP, TIMES FOUR, USING LEFT INTERNAL MAMMARY ARTERY AND ENDOSCOPICALLY HARVESTED BILATERAL GREATER SAPHENOUS VEINS WITH CLIPPING OF LEFT ATRIAL APPENDAGE;  Surgeon: Loreli Slot, MD;  Location: [facility] OR;  Service: Open Heart Surgery;  Laterality: N/A;  LIMA to LAD, SVG to RAMUS INTERMEDIATE, and SVG SEQUENTIALLY to CIRCUMFLEX and PDA  . IR FLUORO GUIDED NEEDLE PLC ASPIRATION/INJECTION LOC  07/12/2016  . KNEE ARTHROSCOPY Bilateral 2000s  . LEFT HEART CATH AND CORONARY ANGIOGRAPHY N/A 05/29/2016   Procedure: Left Heart Cath and Coronary Angiography;  Surgeon: Lennette Bihari, MD;  Location: [facility] INVASIVE CV LAB;  Service: Cardiovascular;  Laterality: N/A;  . LUMBAR DISC SURGERY  2006; 07/2005  . LUMBAR WOUND DEBRIDEMENT N/A 06/26/2016   Procedure: Incision and Drainage of LUMBAR WOUND;  Surgeon: Donalee Citrin, MD;  Location: [facility] OR;  Service: Neurosurgery;  Laterality: N/A;  . POSTERIOR LUMBAR FUSION  04/2016  . TEE WITHOUT CARDIOVERSION N/A 07/04/2016   Procedure: TRANSESOPHAGEAL ECHOCARDIOGRAM (TEE);  Surgeon: Wendall Stade, MD;  Location: [facility] ENDOSCOPY;  Service: Cardiovascular;  Laterality: N/A;  . TONSILLECTOMY    .  TUBAL LIGATION       Inpatient Medications: Scheduled Meds: . apixaban  5 mg Oral BID  . aspirin EC  81 mg Oral Daily  . atorvastatin  80 mg Oral q1800  . cholecalciferol  2,000 Units Oral QPC breakfast  . cycloSPORINE  1 drop Both Eyes BID  . diltiazem  60 mg Oral Q6H  . ferrous sulfate  325 mg Oral BID WC  . furosemide  40 mg Oral Daily  . levothyroxine  112 mcg Oral QAC breakfast  . lidocaine  1 patch Transdermal Q24H  . magnesium chloride  1 tablet Oral Daily  . mouth rinse  15 mL Mouth Rinse  BID  . multivitamin with minerals  1 tablet Oral Daily  . mupirocin ointment   Topical BID  . pantoprazole  40 mg Oral QHS  . polyethylene glycol  17 g Oral BID  . senna-docusate  2 tablet Oral BID  . sodium chloride flush  3 mL Intravenous Q12H  . sodium chloride flush  3 mL Intravenous Q12H  . sodium chloride flush  3 mL Intravenous Q12H   Continuous Infusions: . sodium chloride 250 mL (07/11/16 0700)  . sodium chloride    . sodium chloride    .  ceFAZolin (ANCEF) IV Stopped (07/25/16 1037)  . lactated ringers 10 mL/hr at 07/18/16 1825  . magnesium sulfate 1 - 4 g bolus IVPB 2 g (07/25/16 1203)   PRN Meds: acetaminophen **OR** acetaminophen, alum & mag hydroxide-simeth, cyclobenzaprine, HYDROcodone-acetaminophen, ipratropium-albuterol, ketorolac, levalbuterol, menthol-cetylpyridinium **OR** phenol, metoprolol tartrate, naLOXone (NARCAN)  injection, ondansetron **OR** [DISCONTINUED] ondansetron (ZOFRAN) IV, sodium chloride flush, sodium chloride flush, sodium chloride flush, sodium chloride flush, tiZANidine, white petrolatum  Allergies:    Allergies  Allergen Reactions  . Ace Inhibitors Swelling    ACE stopped after pt seen in ED with facial swelling- allergy testing pending  . Ambien [Zolpidem] Other (See Comments)    confusion  . Morphine And Related   . Codeine Nausea And Vomiting    Social History:   Social History   Social History  . Marital status: Divorced    Spouse name: N/A  . Number of children: N/A  . Years of education: N/A   Occupational History  . Not on file.   Social History Main Topics  . Smoking status: Never Smoker  . Smokeless tobacco: Never Used  . Alcohol use No  . Drug use: No  . Sexual activity: Not Currently   Other Topics Concern  . Not on file   Social History Narrative  . No narrative on file    Family History:   The patient's family history includes CAD in her sister and son.  ROS:  Please see the history of present illness.    ROS  Leg weakness and palpitations All other ROS reviewed and negative.     Physical Exam/Data:   Vitals:   07/25/16 0008 07/25/16 0027 07/25/16 0347 07/25/16 0400  BP: 113/68 110/84  120/73  Pulse:    (!) 119  Resp:    18  Temp:    98 F (36.7 C)  TempSrc:    Oral  SpO2:    97%  Weight:   178 lb 9.2 oz (81 kg)   Height:        Intake/Output Summary (Last 24 hours) at 07/25/16 1207 Last data filed at 07/25/16 0300  Gross per 24 hour  Intake  600 ml  Output             2350 ml  Net            -1750 ml   Filed Weights   07/23/16 0602 07/24/16 0505 07/25/16 0347  Weight: 171 lb 15.3 oz (78 kg) 181 lb 3.5 oz (82.2 kg) 178 lb 9.2 oz (81 kg)   Body mass index is 31.63 kg/m.  General:  Elderly white female  HEENT: normal Lymph: no adenopathy Neck: no JVD Endocrine:  No thryomegaly Vascular: No carotid bruits; FA pulses 2+ bilaterally without bruits  Cardiac:  normal S1, S2; RRR; sternum well healed MR murmur  Lungs:  clear to auscultation bilaterally, no wheezing, rhonchi or rales  Abd: soft, nontender, no hepatomegaly  Ext: no edema Musculoskeletal:  No deformities, BUE and BLE strength normal and equal Skin: warm and dry  Neuro:  CNs 2-12 intact, no focal abnormalities noted Psych:  Normal affect  Lumbar wound with dressing no oozing or bleeding    EKG: 07/22/16 afib/flutter rate 125 low voltage   Relevant CV Studies: TEE/DCC:  07/04/16 EF 40-45% LAA clip intact moderate MR no LAA thrombus Cath 05/29/16 Tresa Endo  Conclusion     Ost 1st Diag lesion, 70 %stenosed.  LM lesion, 35 %stenosed.  Prox LAD lesion, 50 %stenosed.  Mid LAD to Dist LAD lesion, 75 %stenosed.  Ost Cx lesion, 90 %stenosed.  Prox Cx lesion, 80 %stenosed.  Mid Cx lesion, 80 %stenosed.  Prox RCA lesion, 95 %stenosed.  Mid RCA lesion, 50 %stenosed.  RPDA lesion, 80 %stenosed.   Normal LV function with an EF of 55-60% without focal segmental wall motion  abnormality.  Evidence for coronary calcification with severe multivessel CAD with 30-40% smooth distal left main stenosis, 50% proximal LAD stenosis with 70% stenosis of a small first diagonal branch and diffuse 75% mid-distal LAD stenoses; segmental proximal, mid and distal circumflex stenoses of 90, 80, and 80%; and 95% very proximal RCA stenosis followed by diffuse 50% narrowing and 80% PDA stenosis.  RECOMMENDATION: CV surgical consultation for CABG revascularization surgery.  Patient will be started on heparin tonight.  Timing of CABG revascularization surgery possibly prior to back surgery will need to be discussed with Dr. Wynetta Emery and CV surgeon.    CABG:  Dorris Fetch 05/30/16 LIMA LAD SVG IM, SVG PDA/OM2 LAA clip    Laboratory Data:  Chemistry Recent Labs Lab 07/23/16 0312 07/24/16 0409 07/25/16 0400  NA 130* 129* 130*  K 3.6 3.5 3.5  CL 95* 90* 91*  CO2 27 30 30   GLUCOSE 106* 111* 104*  BUN 6 <5* 5*  CREATININE 0.91 0.89 0.82  CALCIUM 7.9* 7.9* 8.1*  GFRNONAA >60 >60 >60  GFRAA >60 >60 >60  ANIONGAP 8 9 9      Recent Labs Lab 07/22/16 0415 07/23/16 0312 07/24/16 0409  ALBUMIN 1.6* 1.8* 1.9*   Hematology Recent Labs Lab 07/23/16 0312 07/24/16 0409 07/25/16 0400  WBC 7.6 7.9 8.6  RBC 3.36* 3.47* 3.41*  HGB 9.5* 9.6* 9.5*  HCT 29.2* 30.0* 29.5*  MCV 86.9 86.5 86.5  MCH 28.3 27.7 27.9  MCHC 32.5 32.0 32.2  RDW 16.9* 17.1* 17.1*  PLT 163 160 156   Cardiac EnzymesNo results for input(s): TROPONINI in the last 168 hours. No results for input(s): TROPIPOC in the last 168 hours.  BNPNo results for input(s): BNP, PROBNP in the last 168 hours.  DDimer  Recent Labs Lab 07/19/16 0302  DDIMER 8.63*  Radiology/Studies:  Ct Abdomen Pelvis Wo Contrast  Result Date: 07/23/2016 CLINICAL DATA:  Post lumbar surgery with epidural abscess and wound exploration. Question retroperitoneal bleed. EXAM: CT ABDOMEN AND PELVIS WITHOUT CONTRAST TECHNIQUE: Multidetector CT  imaging of the abdomen and pelvis was performed following the standard protocol without IV contrast. COMPARISON:  Abdominal ultrasound 06/25/2016, no dedicated abdominal CT. Chest CT performed 07/20/2011 FINDINGS: Lower chest: Decreased size of left pleural effusion from prior chest CTA, small volume of pleural fluid persists. There completely resolved right pleural effusion. Linear atelectasis in both lower lobes. Post median sternotomy with atherosclerosis of native coronary arteries. Hepatobiliary: No focal hepatic lesion allowing for lack contrast. High-density material in the gallbladder may be sludge/ stones are vicarious excretion of IV contrast, no pericholecystic inflammation. Pancreas: Parenchymal atrophy. No ductal dilatation or inflammation. Spleen: Normal in size without focal abnormality. Splenule anteriorly. Adrenals/Urinary Tract: Trace thickening of the left adrenal gland without dominant nodule. Right adrenal gland is normal. No hydronephrosis. Lobular bilateral renal contours with mild renal cortical thinning. The ureters are decompressed. Urinary bladder is physiologically distended without wall thickening. Stomach/Bowel: Stomach is physiologically distended. No bowel obstruction, inflammation or evidence of wall thickening. Small volume of colonic stool. Appendix tentatively identified and normal. Vascular/Lymphatic: No evidence of retroperitoneal bleed. Symmetric appearance of iliopsoas muscles, no soft tissue stranding. Aortic and branch atherosclerosis. No aneurysm. No definite adenopathy. Streak artifact from lumbar spine hardware partially obscures retroperitoneal evaluation. Reproductive: Uterus and bilateral adnexa are unremarkable. Other: No ascites or free air.  There is whole body wall edema. Musculoskeletal: Postsurgical change in the lumbar spine with posterior fusion L2 through S1. Examination is not tailored for detailed spine evaluation. L4 pedicle screws have been removed since  prior lumbar spine CT. IMPRESSION: 1. No evidence of retroperitoneal bleed or acute abnormality in the abdomen/pelvis. 2. High-density material in the gallbladder may be combination of sludge/stones and vicarious excretion of IV contrast from prior chest CT. No gallbladder inflammation. 3. Decreased pleural effusions from prior chest CT, near completely resolved on the right. Electronically Signed   By: Rubye Oaks M.D.   On: 07/23/2016 21:59    Assessment and Plan:   1. PAF:  Change back to iv amiodarone, increase cardizem to 60 q 6 continue Eliquis.  She was bradycardic post [facility] so try to titrate cardizem before adding beta blocker 2. CAD/CABG:  No chest pain sternum well healed 81 mg ASA given need for DOAC 3. Sepsis/Lumbar:  See note Dr Wynetta Emery would like to restore NSR before going to rehab Follow anemia continue ancef CT with hardware in good position    Signed, Charlton Haws, MD  07/25/2016 12:07 PM
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
## 2016-07-25 NOTE — Progress Notes (Signed)
Pt HR sustaining > 120, pt asymptomatic, PRN metoprolol given per order.  RN will continue to monitor.
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
## 2016-07-25 NOTE — Clinical Social Work Note (Signed)
CSW informed by RN pt is on amiodarone drip/elevated heart rate and will not discharge today. RN states pt may be ready tomorrow or in the next couple days. CSW spoke with Marcelino Duster at [employer] [employer] for update. Marcelino Duster continuing to pursue [employer], which is still pending.   PLAN: Pt to discharge to [facility] when medically stable. CSW will continue to follow for discharge needs.   Corlis Hove, LCSWA, LCASA Clinical Social Work [phone]

## 2016-07-26 LAB — MAGNESIUM: Magnesium: 1.7 mg/dL (ref 1.7–2.4)

## 2016-07-26 NOTE — Progress Notes (Signed)
Assumed care from off going RN @ [phone]; patient alert & oriented; denies CP but c/o lower back pain; pain medication given; assisted patient to bathroom and up in chair; side table in front; call bell near; will continue to monitor patient throughout shift

## 2016-07-26 NOTE — Progress Notes (Signed)
Progress Note  Patient Name: Erin [name] Date of Encounter: 07/26/2016  Primary Cardiologist: Tresa Endo  Subjective   Still with some dyspnea "cold"   Inpatient Medications    Scheduled Meds: . apixaban  5 mg Oral BID  . aspirin EC  81 mg Oral Daily  . atorvastatin  80 mg Oral q1800  . cholecalciferol  2,000 Units Oral QPC breakfast  . cycloSPORINE  1 drop Both Eyes BID  . diltiazem  60 mg Oral Q6H  . ferrous sulfate  325 mg Oral BID WC  . furosemide  40 mg Oral Daily  . levothyroxine  112 mcg Oral QAC breakfast  . lidocaine  1 patch Transdermal Q24H  . magnesium chloride  1 tablet Oral Daily  . mouth rinse  15 mL Mouth Rinse BID  . multivitamin with minerals  1 tablet Oral Daily  . mupirocin ointment   Topical BID  . pantoprazole  40 mg Oral QHS  . polyethylene glycol  17 g Oral BID  . senna-docusate  2 tablet Oral BID  . sodium chloride flush  3 mL Intravenous Q12H  . sodium chloride flush  3 mL Intravenous Q12H  . sodium chloride flush  3 mL Intravenous Q12H   Continuous Infusions: . sodium chloride 250 mL (07/11/16 0700)  . sodium chloride    . sodium chloride    . amiodarone 30 mg/hr (07/26/16 0008)  .  ceFAZolin (ANCEF) IV Stopped (07/26/16 0145)  . lactated ringers 10 mL/hr at 07/18/16 1825   PRN Meds: acetaminophen **OR** acetaminophen, alum & mag hydroxide-simeth, cyclobenzaprine, HYDROcodone-acetaminophen, ipratropium-albuterol, ketorolac, levalbuterol, menthol-cetylpyridinium **OR** phenol, metoprolol tartrate, naLOXone (NARCAN)  injection, ondansetron **OR** [DISCONTINUED] ondansetron (ZOFRAN) IV, sodium chloride flush, sodium chloride flush, sodium chloride flush, sodium chloride flush, tiZANidine, white petrolatum   Vital Signs    Vitals:   07/25/16 1325 07/25/16 1419 07/25/16 2044 07/26/16 0533  BP: (!) 145/91 127/83 (!) 147/93 134/84  Pulse: (!) 126 (!) 116 (!) 113 (!) 121  Resp: 18 16 18 18   Temp: 98.2 F (36.8 C)  98.3 F (36.8 C) 97.5 F (36.4  C)  TempSrc: Oral  Oral Oral  SpO2: 98% 96% 98% 96%  Weight:    177 lb 4 oz (80.4 kg)  Height:        Intake/Output Summary (Last 24 hours) at 07/26/16 0958 Last data filed at 07/26/16 0800  Gross per 24 hour  Intake               60 ml  Output                0 ml  Net               60 ml   Filed Weights   07/24/16 0505 07/25/16 0347 07/26/16 0533  Weight: 181 lb 3.5 oz (82.2 kg) 178 lb 9.2 oz (81 kg) 177 lb 4 oz (80.4 kg)    Telemetry    Flutter rate 122 07/26/2016  Personally Reviewed  ECG    Flutter nonspecific ST changes - Personally Reviewed  Physical Exam   GEN: Chronically ill white female .   Neck: No JVD Cardiac: RRR, no murmurs, rubs, or gallops.  Respiratory: poor inspiratory effort  GI: Soft, nontender, non-distended  MS: No edema; No deformity. Neuro:  Nonfocal  Psych: Normal affect  Post lumbar back surgery dressing dry   Labs    Chemistry Recent Labs Lab 07/22/16 0415 07/23/16 0312 07/24/16 0409 07/25/16 0400  NA 129* 130* 129* 130*  K 3.7 3.6 3.5 3.5  CL 92* 95* 90* 91*  CO2 28 27 30 30   GLUCOSE 105* 106* 111* 104*  BUN 7 6 <5* 5*  CREATININE 1.06* 0.91 0.89 0.82  CALCIUM 7.6* 7.9* 7.9* 8.1*  ALBUMIN 1.6* 1.8* 1.9*  --   GFRNONAA 51* >60 >60 >60  GFRAA 59* >60 >60 >60  ANIONGAP 9 8 9 9      Hematology Recent Labs Lab 07/23/16 0312 07/24/16 0409 07/25/16 0400  WBC 7.6 7.9 8.6  RBC 3.36* 3.47* 3.41*  HGB 9.5* 9.6* 9.5*  HCT 29.2* 30.0* 29.5*  MCV 86.9 86.5 86.5  MCH 28.3 27.7 27.9  MCHC 32.5 32.0 32.2  RDW 16.9* 17.1* 17.1*  PLT 163 160 156    Cardiac EnzymesNo results for input(s): TROPONINI in the last 168 hours. No results for input(s): TROPIPOC in the last 168 hours.   BNPNo results for input(s): BNP, PROBNP in the last 168 hours.   DDimer No results for input(s): DDIMER in the last 168 hours.   Radiology    No results found.  Cardiac Studies   TEE/DCCL:  07/04/16  EF 40-45% moderate MR LAA clip  intact  Patient Profile     71 y.o. female with sepsis and lumbar surgery with debridement. Recurrent Flutter   Assessment & Plan    1) Still with elevated flutter despite increased cardizem and iv amiodarone.  Tentatively Schedule TEE/DCC for Monday if she does not convert will rebolus with amiodarone 2) CHF recheck CXR and BNP continue lasix 40 mg daily  3. Sepsis. Needs 6 weeks antibiotics continue ancef  Signed, Charlton Haws, MD  07/26/2016, 9:58 AM

## 2016-07-26 NOTE — Care Management Important Message (Signed)
Important Message  Patient Details  Name: Erin [name] MRN: [id number] Date of Birth: 11-22-1945   Medicare Important Message Given:  Yes    [name] 07/26/2016, 11:51 AM
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
## 2016-07-27 ENCOUNTER — Inpatient Hospital Stay (HOSPITAL_COMMUNITY): Payer: Medicare HMO

## 2016-07-27 LAB — BASIC METABOLIC PANEL
Anion gap: 9 (ref 5–15)
BUN: 5 mg/dL — ABNORMAL LOW (ref 6–20)
CO2: 29 mmol/L (ref 22–32)
Calcium: 8 mg/dL — ABNORMAL LOW (ref 8.9–10.3)
Chloride: 91 mmol/L — ABNORMAL LOW (ref 101–111)
Creatinine, Ser: 0.83 mg/dL (ref 0.44–1.00)
GFR calc Af Amer: >60 mL/min (ref >60)
GFR calc non Af Amer: >60 mL/min (ref >60)
Glucose, Bld: 111 mg/dL — ABNORMAL HIGH (ref 65–99)
Potassium: 3 mmol/L — ABNORMAL LOW (ref 3.5–5.1)
Sodium: 129 mmol/L — ABNORMAL LOW (ref 135–145)

## 2016-07-27 LAB — MAGNESIUM: Magnesium: 1.5 mg/dL — ABNORMAL LOW (ref 1.7–2.4)

## 2016-07-27 MED ORDER — KETOROLAC TROMETHAMINE 15 MG/ML IJ SOLN
15.0000 mg | Freq: Four times a day (QID) | INTRAMUSCULAR | Status: DC | PRN
  Administered 2016-07-27 – 2016-08-01 (×8): 15 mg via INTRAVENOUS
  Filled 2016-07-27 (×8): qty 1

## 2016-07-27 MED ORDER — KETOROLAC TROMETHAMINE 15 MG/ML IJ SOLN
15.0000 mg | Freq: Four times a day (QID) | INTRAMUSCULAR | Status: DC
  Administered 2016-07-27: 15 mg via INTRAVENOUS

## 2016-07-27 MED ORDER — POTASSIUM CHLORIDE CRYS ER 20 MEQ PO TBCR
40.0000 meq | EXTENDED_RELEASE_TABLET | Freq: Once | ORAL | Status: AC
  Administered 2016-07-27: 40 meq via ORAL
  Filled 2016-07-27: qty 2

## 2016-07-27 MED ORDER — MAGNESIUM SULFATE 2 GM/50ML IV SOLN
2.0000 g | Freq: Once | INTRAVENOUS | Status: AC
  Administered 2016-07-27: 2 g via INTRAVENOUS
  Filled 2016-07-27: qty 50

## 2016-07-27 NOTE — Progress Notes (Signed)
Clinical Social Worker following patient/family  for support and discharge need. Patient has a bed waiting at [facility] once patient is medically ready.  Marrianne Mood, MSW,  [employer] [phone]
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
## 2016-07-27 NOTE — Progress Notes (Signed)
Patient ambulated in the hallway with front wheeled walker and back brace 200 feet.
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
## 2016-07-27 NOTE — Progress Notes (Signed)
ANTICOAGULATION CONSULT NOTE - Follow Up Consult  Pharmacy Consult for Eliquis Indication: atrial fibrillation  Allergies  Allergen Reactions  . Ace Inhibitors Swelling    ACE stopped after pt seen in ED with facial swelling- allergy testing pending  . Ambien [Zolpidem] Other (See Comments)    confusion  . Morphine And Related   . Codeine Nausea And Vomiting    Patient Measurements: Height: 5\' 3"  (160 cm) Weight: 176 lb 1.6 oz (79.9 kg) IBW/kg (Calculated) : 52.4  Vital Signs: Temp: 97.4 F (36.3 C) (06/08 0448) Temp Source: Oral (06/08 0448) BP: 131/84 (06/08 0448) Pulse Rate: 126 (06/08 1116)  Labs:  Recent Labs  07/25/16 0400 07/27/16 0413  HGB 9.5*  --   HCT 29.5*  --   PLT 156  --   CREATININE 0.82 0.83    Estimated Creatinine Clearance: 61.3 mL/min (by C-G formula based on SCr of 0.83 mg/dL).   Assessment:  Anticoag: Eliquis from PTA for AFib (new since April admit). 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 No new labs since 6/6.Last Hgb 9.5. Scr 0.83 with CrCl 61.  Goal of Therapy:  Therapeutic oral anticoagulation Monitor platelets by anticoagulation protocol: Yes   Plan:  Eliquis 5mg  BID   Kimi Kroft S. Merilynn Finland, PharmD, BCPS Clinical Staff Pharmacist Pager [phone]  Misty Stanley Stillinger 07/27/2016,11:28 AM

## 2016-07-27 NOTE — Progress Notes (Signed)
Patient's back surgeon stated patient is not to be twisting or pulling herself up with her arms.  Patient is to be walking in the hall several times every day.

## 2016-07-27 NOTE — Progress Notes (Signed)
Progress Note  Patient Name: Erin [name] Date of Encounter: 07/27/2016  Primary Cardiologist: Tresa Endo  Subjective   Did PT feels ok no dyspnea notices her HR is lower   Inpatient Medications    Scheduled Meds: . apixaban  5 mg Oral BID  . aspirin EC  81 mg Oral Daily  . atorvastatin  80 mg Oral q1800  . cholecalciferol  2,000 Units Oral QPC breakfast  . cycloSPORINE  1 drop Both Eyes BID  . diltiazem  60 mg Oral Q6H  . ferrous sulfate  325 mg Oral BID WC  . furosemide  40 mg Oral Daily  . levothyroxine  112 mcg Oral QAC breakfast  . lidocaine  1 patch Transdermal Q24H  . magnesium chloride  1 tablet Oral Daily  . mouth rinse  15 mL Mouth Rinse BID  . multivitamin with minerals  1 tablet Oral Daily  . mupirocin ointment   Topical BID  . pantoprazole  40 mg Oral QHS  . polyethylene glycol  17 g Oral BID  . senna-docusate  2 tablet Oral BID  . sodium chloride flush  3 mL Intravenous Q12H  . sodium chloride flush  3 mL Intravenous Q12H  . sodium chloride flush  3 mL Intravenous Q12H   Continuous Infusions: . sodium chloride 250 mL (07/11/16 0700)  . sodium chloride    . sodium chloride    . amiodarone 30 mg/hr (07/27/16 0016)  .  ceFAZolin (ANCEF) IV 2 g (07/27/16 0900)  . lactated ringers 10 mL/hr at 07/18/16 1825   PRN Meds: acetaminophen **OR** acetaminophen, alum & mag hydroxide-simeth, cyclobenzaprine, HYDROcodone-acetaminophen, ipratropium-albuterol, ketorolac, levalbuterol, menthol-cetylpyridinium **OR** phenol, metoprolol tartrate, naLOXone (NARCAN)  injection, ondansetron **OR** [DISCONTINUED] ondansetron (ZOFRAN) IV, sodium chloride flush, sodium chloride flush, sodium chloride flush, sodium chloride flush, tiZANidine, white petrolatum   Vital Signs    Vitals:   07/26/16 2011 07/27/16 0016 07/27/16 0400 07/27/16 0448  BP: 127/71 130/78  131/84  Pulse: (!) 116 (!) 115 (!) 121 (!) 118  Resp: 18   18  Temp: 97.9 F (36.6 C)   97.4 F (36.3 C)  TempSrc:  Oral   Oral  SpO2: 98%   93%  Weight:    176 lb 1.6 oz (79.9 kg)  Height:        Intake/Output Summary (Last 24 hours) at 07/27/16 1017 Last data filed at 07/27/16 0730  Gross per 24 hour  Intake            573.6 ml  Output              501 ml  Net             72.6 ml   Filed Weights   07/25/16 0347 07/26/16 0533 07/27/16 0448  Weight: 178 lb 9.2 oz (81 kg) 177 lb 4 oz (80.4 kg) 176 lb 1.6 oz (79.9 kg)    Telemetry    Flutter rate improved in 80's  07/27/2016  Personally Reviewed  ECG    Flutter nonspecific ST changes - Personally Reviewed  Physical Exam   BP 131/84 (BP Location: Right Arm)   Pulse (!) 118   Temp 97.4 F (36.3 C) (Oral)   Resp 18   Ht 5\' 3"  (1.6 m)   Wt 176 lb 1.6 oz (79.9 kg)   SpO2 93%   BMI 31.19 kg/m  Affect appropriate Chronically ill white female HEENT: normal Neck supple with no adenopathy JVP normal no bruits no thyromegaly Lungs  poor inspiratory effort basilar crackles  Heart:  S1/S2 no murmur, no rub, gallop or click PMI normal Abdomen: benighn, BS positve, no tenderness, no AAA no bruit.  No HSM or HJR Distal pulses intact with no bruits No edema Neuro non-focal Skin warm and dry Lumbar surgery incision dry    Labs    Chemistry  Recent Labs Lab 07/22/16 0415 07/23/16 0312 07/24/16 0409 07/25/16 0400 07/27/16 0413  NA 129* 130* 129* 130* 129*  K 3.7 3.6 3.5 3.5 3.0*  CL 92* 95* 90* 91* 91*  CO2 28 27 30 30 29   GLUCOSE 105* 106* 111* 104* 111*  BUN 7 6 <5* 5* 5*  CREATININE 1.06* 0.91 0.89 0.82 0.83  CALCIUM 7.6* 7.9* 7.9* 8.1* 8.0*  ALBUMIN 1.6* 1.8* 1.9*  --   --   GFRNONAA 51* >60 >60 >60 >60  GFRAA 59* >60 >60 >60 >60  ANIONGAP 9 8 9 9 9      Hematology  Recent Labs Lab 07/23/16 0312 07/24/16 0409 07/25/16 0400  WBC 7.6 7.9 8.6  RBC 3.36* 3.47* 3.41*  HGB 9.5* 9.6* 9.5*  HCT 29.2* 30.0* 29.5*  MCV 86.9 86.5 86.5  MCH 28.3 27.7 27.9  MCHC 32.5 32.0 32.2  RDW 16.9* 17.1* 17.1*  PLT 163 160 156     Cardiac EnzymesNo results for input(s): TROPONINI in the last 168 hours. No results for input(s): TROPIPOC in the last 168 hours.   BNPNo results for input(s): BNP, PROBNP in the last 168 hours.   DDimer No results for input(s): DDIMER in the last 168 hours.   Radiology    No results found.  Cardiac Studies   TEE/DCCL:  07/04/16  EF 40-45% moderate MR LAA clip intact  Patient Profile     71 y.o. female with sepsis and lumbar surgery with debridement. Recurrent Flutter   Assessment & Plan    1) Flutter -Scheduled TEE/DCC for Monday if she does not convert will rebolus with amiodarone 2) CHF recheck CXR and BNP continue lasix 40 mg daily  3.) Sepsis. Needs 6 weeks antibiotics continue ancef  Signed, Charlton Haws, MD  07/27/2016, 10:17 AM

## 2016-07-28 LAB — BRAIN NATRIURETIC PEPTIDE: B Natriuretic Peptide: 371.5 pg/mL — ABNORMAL HIGH (ref 0.0–100.0)

## 2016-07-28 LAB — MAGNESIUM: Magnesium: 2.2 mg/dL (ref 1.7–2.4)

## 2016-07-28 LAB — BASIC METABOLIC PANEL
Anion gap: 8 (ref 5–15)
BUN: 6 mg/dL (ref 6–20)
CO2: 30 mmol/L (ref 22–32)
Calcium: 8 mg/dL — ABNORMAL LOW (ref 8.9–10.3)
Chloride: 91 mmol/L — ABNORMAL LOW (ref 101–111)
Creatinine, Ser: 0.84 mg/dL (ref 0.44–1.00)
GFR calc Af Amer: >60 mL/min (ref >60)
GFR calc non Af Amer: >60 mL/min (ref >60)
Glucose, Bld: 113 mg/dL — ABNORMAL HIGH (ref 65–99)
Potassium: 3.5 mmol/L (ref 3.5–5.1)
Sodium: 129 mmol/L — ABNORMAL LOW (ref 135–145)

## 2016-07-28 NOTE — Progress Notes (Signed)
Progress Note  Patient Name: Erin [name] Date of Encounter: 07/28/2016  Primary Cardiologist: Tresa Endo  Subjective   Back hurts a bit less palpitations   Inpatient Medications    Scheduled Meds: . apixaban  5 mg Oral BID  . aspirin EC  81 mg Oral Daily  . atorvastatin  80 mg Oral q1800  . cholecalciferol  2,000 Units Oral QPC breakfast  . cycloSPORINE  1 drop Both Eyes BID  . diltiazem  60 mg Oral Q6H  . ferrous sulfate  325 mg Oral BID WC  . furosemide  40 mg Oral Daily  . levothyroxine  112 mcg Oral QAC breakfast  . lidocaine  1 patch Transdermal Q24H  . magnesium chloride  1 tablet Oral Daily  . mouth rinse  15 mL Mouth Rinse BID  . multivitamin with minerals  1 tablet Oral Daily  . mupirocin ointment   Topical BID  . pantoprazole  40 mg Oral QHS  . polyethylene glycol  17 g Oral BID  . senna-docusate  2 tablet Oral BID  . sodium chloride flush  3 mL Intravenous Q12H  . sodium chloride flush  3 mL Intravenous Q12H  . sodium chloride flush  3 mL Intravenous Q12H   Continuous Infusions: . sodium chloride 250 mL (07/11/16 0700)  . sodium chloride    . sodium chloride    . amiodarone 30 mg/hr (07/27/16 2343)  .  ceFAZolin (ANCEF) IV Stopped (07/28/16 0222)  . lactated ringers 10 mL/hr at 07/18/16 1825   PRN Meds: acetaminophen **OR** acetaminophen, alum & mag hydroxide-simeth, cyclobenzaprine, HYDROcodone-acetaminophen, ipratropium-albuterol, ketorolac, levalbuterol, menthol-cetylpyridinium **OR** phenol, metoprolol tartrate, naLOXone (NARCAN)  injection, ondansetron **OR** [DISCONTINUED] ondansetron (ZOFRAN) IV, sodium chloride flush, sodium chloride flush, sodium chloride flush, sodium chloride flush, tiZANidine, white petrolatum   Vital Signs    Vitals:   07/27/16 1116 07/27/16 1337 07/27/16 2031 07/28/16 0401  BP:  124/78 132/75 124/77  Pulse: (!) 126 (!) 105 (!) 116 95  Resp:  18 18 18   Temp:  98 F (36.7 C) 98.2 F (36.8 C) 97.9 F (36.6 C)  TempSrc:   Oral Oral Oral  SpO2:  98% 95% 96%  Weight:    176 lb 11.2 oz (80.2 kg)  Height:        Intake/Output Summary (Last 24 hours) at 07/28/16 0742 Last data filed at 07/28/16 0533  Gross per 24 hour  Intake              590 ml  Output              300 ml  Net              290 ml   Filed Weights   07/26/16 0533 07/27/16 0448 07/28/16 0401  Weight: 177 lb 4 oz (80.4 kg) 176 lb 1.6 oz (79.9 kg) 176 lb 11.2 oz (80.2 kg)    Telemetry    Flutter rate improved in 80's  07/28/2016  Personally Reviewed  ECG    Flutter nonspecific ST changes - Personally Reviewed  Physical Exam   BP 124/77 (BP Location: Right Arm)   Pulse 95   Temp 97.9 F (36.6 C) (Oral)   Resp 18   Ht 5\' 3"  (1.6 m)   Wt 176 lb 11.2 oz (80.2 kg)   SpO2 96%   BMI 31.30 kg/m   Chronically ill white female Post lumbar back surgery dry dressing Affect appropriate Healthy:  appears stated age HEENT: normal Neck supple  with no adenopathy JVP normal no bruits no thyromegaly Lungs clear with no wheezing and good diaphragmatic motion Heart:  S1/S2 no murmur, no rub, gallop or click PMI normal Abdomen: benighn, BS positve, no tenderness, no AAA no bruit.  No HSM or HJR Distal pulses intact with no bruits No edema Neuro non-focal Skin warm and dry No muscular weakness    Labs    Chemistry  Recent Labs Lab 07/22/16 0415 07/23/16 0312 07/24/16 0409 07/25/16 0400 07/27/16 0413 07/28/16 0519  NA 129* 130* 129* 130* 129* 129*  K 3.7 3.6 3.5 3.5 3.0* 3.5  CL 92* 95* 90* 91* 91* 91*  CO2 28 27 30 30 29 30   GLUCOSE 105* 106* 111* 104* 111* 113*  BUN 7 6 <5* 5* 5* 6  CREATININE 1.06* 0.91 0.89 0.82 0.83 0.84  CALCIUM 7.6* 7.9* 7.9* 8.1* 8.0* 8.0*  ALBUMIN 1.6* 1.8* 1.9*  --   --   --   GFRNONAA 51* >60 >60 >60 >60 >60  GFRAA 59* >60 >60 >60 >60 >60  ANIONGAP 9 8 9 9 9 8      Hematology  Recent Labs Lab 07/23/16 0312 07/24/16 0409 07/25/16 0400  WBC 7.6 7.9 8.6  RBC 3.36* 3.47* 3.41*  HGB 9.5*  9.6* 9.5*  HCT 29.2* 30.0* 29.5*  MCV 86.9 86.5 86.5  MCH 28.3 27.7 27.9  MCHC 32.5 32.0 32.2  RDW 16.9* 17.1* 17.1*  PLT 163 160 156    Cardiac EnzymesNo results for input(s): TROPONINI in the last 168 hours. No results for input(s): TROPIPOC in the last 168 hours.   BNP  Recent Labs Lab 07/28/16 0519  BNP 371.5*     DDimer No results for input(s): DDIMER in the last 168 hours.   Radiology    Dg Chest 2 View  Result Date: 07/27/2016 CLINICAL DATA:  Shortness of Breath EXAM: CHEST  2 VIEW COMPARISON:  Chest radiograph Jul 19, 2016 and chest CT Jul 19, 2016 FINDINGS: There has been clearing of airspace consolidation from the left mid lung and left base. There is mild midlung atelectatic change bilaterally. There is a small left pleural effusion. Lungs elsewhere clear. Heart remains mildly prominent with pulmonary vascularity within normal limits. Central catheter tip is in the superior vena cava. Patient is status post coronary artery bypass grafting. Left atrial appendage clamp is present. There is postoperative change in the lower cervical spine. There is no evident adenopathy. No evident pneumothorax. IMPRESSION: Interval clearing of infiltrate bilaterally. Mild mid lung atelectasis bilaterally with small left pleural effusion. Lungs elsewhere clear. Stable cardiac prominence. Postoperative changes as noted. Central catheter tip in superior vena cava. No evident pneumothorax. Electronically Signed   By: Bretta Bang III M.D.   On: 07/27/2016 15:10    Cardiac Studies   TEE/DCCL:  07/04/16  EF 40-45% moderate MR LAA clip intact  Patient Profile     71 y.o. female with sepsis and lumbar surgery with debridement. Recurrent Flutter   Assessment & Plan    1) Flutter -Scheduled TEE/DCC for Monday if she does not convert continue iv amiodarone  2) CHF CXR yesterday reviewed and improved BNP better 371   continue lasix 40 mg daily  3.) Sepsis. Needs 6 weeks antibiotics continue  ancef  Signed, Charlton Haws, MD  07/28/2016, 7:42 AM
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
## 2016-07-29 LAB — BASIC METABOLIC PANEL
Anion gap: 8 (ref 5–15)
BUN: <5 mg/dL — ABNORMAL LOW (ref 6–20)
CO2: 30 mmol/L (ref 22–32)
Calcium: 8.2 mg/dL — ABNORMAL LOW (ref 8.9–10.3)
Chloride: 91 mmol/L — ABNORMAL LOW (ref 101–111)
Creatinine, Ser: 0.84 mg/dL (ref 0.44–1.00)
GFR calc Af Amer: >60 mL/min (ref >60)
GFR calc non Af Amer: >60 mL/min (ref >60)
Glucose, Bld: 106 mg/dL — ABNORMAL HIGH (ref 65–99)
Potassium: 3.1 mmol/L — ABNORMAL LOW (ref 3.5–5.1)
Sodium: 129 mmol/L — ABNORMAL LOW (ref 135–145)

## 2016-07-29 LAB — MAGNESIUM: Magnesium: 1.6 mg/dL — ABNORMAL LOW (ref 1.7–2.4)

## 2016-07-29 MED ORDER — POTASSIUM CHLORIDE ER 10 MEQ PO TBCR
20.0000 meq | EXTENDED_RELEASE_TABLET | Freq: Two times a day (BID) | ORAL | Status: AC
  Administered 2016-07-29: 20 meq via ORAL
  Filled 2016-07-29 (×2): qty 2

## 2016-07-29 MED ORDER — MAGNESIUM SULFATE 2 GM/50ML IV SOLN
2.0000 g | Freq: Once | INTRAVENOUS | Status: AC
  Administered 2016-07-29: 2 g via INTRAVENOUS
  Filled 2016-07-29: qty 50

## 2016-07-29 MED ORDER — FUROSEMIDE 10 MG/ML IJ SOLN
40.0000 mg | Freq: Two times a day (BID) | INTRAMUSCULAR | Status: AC
  Administered 2016-07-29: 40 mg via INTRAVENOUS
  Filled 2016-07-29: qty 4

## 2016-07-29 NOTE — Progress Notes (Signed)
Progress Note  Patient Name: Erin [name] Date of Encounter: 07/29/2016  Primary Cardiologist: Tresa Endo  Subjective   Still with some dypsnea unable to take deep breath  Inpatient Medications    Scheduled Meds: . apixaban  5 mg Oral BID  . aspirin EC  81 mg Oral Daily  . atorvastatin  80 mg Oral q1800  . cholecalciferol  2,000 Units Oral QPC breakfast  . cycloSPORINE  1 drop Both Eyes BID  . diltiazem  60 mg Oral Q6H  . ferrous sulfate  325 mg Oral BID WC  . furosemide  40 mg Intravenous BID  . furosemide  40 mg Oral Daily  . levothyroxine  112 mcg Oral QAC breakfast  . lidocaine  1 patch Transdermal Q24H  . magnesium chloride  1 tablet Oral Daily  . mouth rinse  15 mL Mouth Rinse BID  . multivitamin with minerals  1 tablet Oral Daily  . mupirocin ointment   Topical BID  . pantoprazole  40 mg Oral QHS  . polyethylene glycol  17 g Oral BID  . potassium chloride  20 mEq Oral BID  . senna-docusate  2 tablet Oral BID  . sodium chloride flush  3 mL Intravenous Q12H  . sodium chloride flush  3 mL Intravenous Q12H  . sodium chloride flush  3 mL Intravenous Q12H   Continuous Infusions: . sodium chloride 250 mL (07/11/16 0700)  . sodium chloride    . sodium chloride    . amiodarone 30 mg/hr (07/29/16 0032)  .  ceFAZolin (ANCEF) IV 2 g (07/29/16 0958)  . lactated ringers 10 mL/hr at 07/18/16 1825   PRN Meds: acetaminophen **OR** acetaminophen, alum & mag hydroxide-simeth, cyclobenzaprine, HYDROcodone-acetaminophen, ipratropium-albuterol, ketorolac, levalbuterol, menthol-cetylpyridinium **OR** phenol, metoprolol tartrate, naLOXone (NARCAN)  injection, ondansetron **OR** [DISCONTINUED] ondansetron (ZOFRAN) IV, sodium chloride flush, sodium chloride flush, sodium chloride flush, sodium chloride flush, tiZANidine, white petrolatum   Vital Signs    Vitals:   07/28/16 0401 07/28/16 1342 07/28/16 2100 07/29/16 0456  BP: 124/77 125/72 123/76 133/88  Pulse: 95 (!) 115 (!) 111 (!)  126  Resp: 18 18 18 20   Temp: 97.9 F (36.6 C) 98.4 F (36.9 C) 98.1 F (36.7 C) 98 F (36.7 C)  TempSrc: Oral Oral Oral Oral  SpO2: 96% 97% 95% 96%  Weight: 80.2 kg (176 lb 11.2 oz)   80 kg (176 lb 5.9 oz)  Height:        Intake/Output Summary (Last 24 hours) at 07/29/16 1029 Last data filed at 07/29/16 0457  Gross per 24 hour  Intake          1592.63 ml  Output                0 ml  Net          1592.63 ml   Filed Weights   07/27/16 0448 07/28/16 0401 07/29/16 0456  Weight: 79.9 kg (176 lb 1.6 oz) 80.2 kg (176 lb 11.2 oz) 80 kg (176 lb 5.9 oz)    Telemetry    Flutter rate improved in 80's  07/29/2016  Personally Reviewed  ECG    Flutter nonspecific ST changes - Personally Reviewed  Physical Exam   BP 133/88 (BP Location: Right Arm)   Pulse (!) 126   Temp 98 F (36.7 C) (Oral)   Resp 20   Ht 5\' 3"  (1.6 m)   Wt 80 kg (176 lb 5.9 oz)   SpO2 96%   BMI 31.24 kg/m  Chronically ill white female Post lumbar back surgery dry dressing Affect appropriate Healthy:  appears stated age HEENT: normal Neck supple with no adenopathy JVP normal no bruits no thyromegaly Lungs rales at base mild exp  wheezing and good diaphragmatic motion Heart:  S1/S2 no murmur, no rub, gallop or click PMI normal Abdomen: benighn, BS positve, no tenderness, no AAA no bruit.  No HSM or HJR Distal pulses intact with no bruits No edema Neuro non-focal Skin warm and dry No muscular weakness    Labs    Chemistry  Recent Labs Lab 07/23/16 0312 07/24/16 0409 07/25/16 0400 07/27/16 0413 07/28/16 0519  NA 130* 129* 130* 129* 129*  K 3.6 3.5 3.5 3.0* 3.5  CL 95* 90* 91* 91* 91*  CO2 27 30 30 29 30   GLUCOSE 106* 111* 104* 111* 113*  BUN 6 <5* 5* 5* 6  CREATININE 0.91 0.89 0.82 0.83 0.84  CALCIUM 7.9* 7.9* 8.1* 8.0* 8.0*  ALBUMIN 1.8* 1.9*  --   --   --   GFRNONAA >60 >60 >60 >60 >60  GFRAA >60 >60 >60 >60 >60  ANIONGAP 8 9 9 9 8      Hematology  Recent Labs Lab  07/23/16 0312 07/24/16 0409 07/25/16 0400  WBC 7.6 7.9 8.6  RBC 3.36* 3.47* 3.41*  HGB 9.5* 9.6* 9.5*  HCT 29.2* 30.0* 29.5*  MCV 86.9 86.5 86.5  MCH 28.3 27.7 27.9  MCHC 32.5 32.0 32.2  RDW 16.9* 17.1* 17.1*  PLT 163 160 156    Cardiac EnzymesNo results for input(s): TROPONINI in the last 168 hours. No results for input(s): TROPIPOC in the last 168 hours.   BNP  Recent Labs Lab 07/28/16 0519  BNP 371.5*     DDimer No results for input(s): DDIMER in the last 168 hours.   Radiology    Dg Chest 2 View  Result Date: 07/27/2016 CLINICAL DATA:  Shortness of Breath EXAM: CHEST  2 VIEW COMPARISON:  Chest radiograph Jul 19, 2016 and chest CT Jul 19, 2016 FINDINGS: There has been clearing of airspace consolidation from the left mid lung and left base. There is mild midlung atelectatic change bilaterally. There is a small left pleural effusion. Lungs elsewhere clear. Heart remains mildly prominent with pulmonary vascularity within normal limits. Central catheter tip is in the superior vena cava. Patient is status post coronary artery bypass grafting. Left atrial appendage clamp is present. There is postoperative change in the lower cervical spine. There is no evident adenopathy. No evident pneumothorax. IMPRESSION: Interval clearing of infiltrate bilaterally. Mild mid lung atelectasis bilaterally with small left pleural effusion. Lungs elsewhere clear. Stable cardiac prominence. Postoperative changes as noted. Central catheter tip in superior vena cava. No evident pneumothorax. Electronically Signed   By: Bretta Bang III M.D.   On: 07/27/2016 15:10    Cardiac Studies   TEE/DCCL:  07/04/16  EF 40-45% moderate MR LAA clip intact  Patient Profile     71 y.o. female with sepsis and lumbar surgery with debridement. Recurrent Flutter   Assessment & Plan    1) Flutter -Scheduled TEE/DCC for Monday if she does not convert continue iv amiodarone  2) CHF CXR yesterday reviewed and  improved BNP better 371   Seems more volume overloaded With positive I/O have written for iv lasix and Kdur bid today to make sure dry for TEE 3.) Sepsis. Needs 6 weeks antibiotics continue ancef  Signed, Charlton Haws, MD  07/29/2016, 10:29 AM
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
## 2016-07-30 ENCOUNTER — Inpatient Hospital Stay (HOSPITAL_COMMUNITY): Payer: Medicare HMO | Admitting: Certified Registered Nurse Anesthetist

## 2016-07-30 ENCOUNTER — Inpatient Hospital Stay (HOSPITAL_COMMUNITY): Payer: Medicare HMO

## 2016-07-30 ENCOUNTER — Encounter (HOSPITAL_COMMUNITY)
Admission: AD | Disposition: A | Discharge status: Skilled Nursing Facility | Payer: Self-pay | Source: Ambulatory Visit | Attending: Family Medicine

## 2016-07-30 ENCOUNTER — Encounter (HOSPITAL_COMMUNITY): Payer: Self-pay | Admitting: Certified Registered Nurse Anesthetist

## 2016-07-30 DIAGNOSIS — I4892 Unspecified atrial flutter: Secondary | ICD-10-CM

## 2016-07-30 DIAGNOSIS — I340 Nonrheumatic mitral (valve) insufficiency: Secondary | ICD-10-CM

## 2016-07-30 HISTORY — PX: TEE WITHOUT CARDIOVERSION: SHX5443

## 2016-07-30 HISTORY — PX: CARDIOVERSION: SHX1299

## 2016-07-30 LAB — BASIC METABOLIC PANEL
Anion gap: 8 (ref 5–15)
BUN: 5 mg/dL — ABNORMAL LOW (ref 6–20)
CO2: 32 mmol/L (ref 22–32)
Calcium: 8.1 mg/dL — ABNORMAL LOW (ref 8.9–10.3)
Chloride: 92 mmol/L — ABNORMAL LOW (ref 101–111)
Creatinine, Ser: 0.82 mg/dL (ref 0.44–1.00)
GFR calc Af Amer: >60 mL/min (ref >60)
GFR calc non Af Amer: >60 mL/min (ref >60)
Glucose, Bld: 100 mg/dL — ABNORMAL HIGH (ref 65–99)
Potassium: 2.7 mmol/L — CL (ref 3.5–5.1)
Sodium: 132 mmol/L — ABNORMAL LOW (ref 135–145)

## 2016-07-30 LAB — CBC
HCT: 29.1 % — ABNORMAL LOW (ref 36.0–46.0)
Hemoglobin: 9.5 g/dL — ABNORMAL LOW (ref 12.0–15.0)
MCH: 28.4 pg (ref 26.0–34.0)
MCHC: 32.6 g/dL (ref 30.0–36.0)
MCV: 87.1 fL (ref 78.0–100.0)
Platelets: 147 10*3/uL — ABNORMAL LOW (ref 150–400)
RBC: 3.34 MIL/uL — ABNORMAL LOW (ref 3.87–5.11)
RDW: 17.6 % — ABNORMAL HIGH (ref 11.5–15.5)
WBC: 7.5 10*3/uL (ref 4.0–10.5)

## 2016-07-30 LAB — MAGNESIUM: Magnesium: 1.7 mg/dL (ref 1.7–2.4)

## 2016-07-30 SURGERY — ECHOCARDIOGRAM, TRANSESOPHAGEAL
Anesthesia: General

## 2016-07-30 MED ORDER — SODIUM CHLORIDE 0.9 % IV SOLN: 250.0000 mL | INTRAVENOUS | Status: DC

## 2016-07-30 MED ORDER — AMIODARONE HCL 200 MG PO TABS
400.0000 mg | ORAL_TABLET | Freq: Two times a day (BID) | ORAL | Status: DC
  Administered 2016-07-31 – 2016-08-01 (×3): 400 mg via ORAL
  Filled 2016-07-30 (×3): qty 2

## 2016-07-30 MED ORDER — SODIUM CHLORIDE 0.9 % IV SOLN: INTRAVENOUS | Status: DC

## 2016-07-30 MED ORDER — SODIUM CHLORIDE 0.9% FLUSH: 3.0000 mL | INTRAVENOUS | Status: DC | PRN

## 2016-07-30 MED ORDER — BUTAMBEN-TETRACAINE-BENZOCAINE 2-2-14 % EX AERO
INHALATION_SPRAY | CUTANEOUS | Status: DC | PRN
  Administered 2016-07-30: 2 via TOPICAL

## 2016-07-30 MED ORDER — AMIODARONE HCL 200 MG PO TABS
400.0000 mg | ORAL_TABLET | Freq: Two times a day (BID) | ORAL | Status: DC
  Administered 2016-07-30: 400 mg via ORAL
  Filled 2016-07-30: qty 2

## 2016-07-30 MED ORDER — MAGNESIUM SULFATE IN D5W 1-5 GM/100ML-% IV SOLN
1.0000 g | Freq: Once | INTRAVENOUS | Status: AC
  Administered 2016-07-30: 1 g via INTRAVENOUS
  Filled 2016-07-30: qty 100

## 2016-07-30 MED ORDER — AMIODARONE IV BOLUS ONLY 150 MG/100ML
150.0000 mg | Freq: Once | INTRAVENOUS | Status: DC
  Filled 2016-07-30: qty 100

## 2016-07-30 MED ORDER — POTASSIUM CHLORIDE CRYS ER 20 MEQ PO TBCR
20.0000 meq | EXTENDED_RELEASE_TABLET | Freq: Once | ORAL | Status: AC
  Administered 2016-07-30: 20 meq via ORAL
  Filled 2016-07-30: qty 1

## 2016-07-30 MED ORDER — SODIUM CHLORIDE 0.9% FLUSH: 3.0000 mL | Freq: Two times a day (BID) | INTRAVENOUS | Status: DC

## 2016-07-30 MED ORDER — PROPOFOL 10 MG/ML IV BOLUS
INTRAVENOUS | Status: DC | PRN
  Administered 2016-07-30: 20 mg via INTRAVENOUS

## 2016-07-30 MED ORDER — POTASSIUM PHOSPHATES 15 MMOLE/5ML IV SOLN
30.0000 meq | Freq: Once | INTRAVENOUS | Status: AC
  Administered 2016-07-30: 30 meq via INTRAVENOUS
  Filled 2016-07-30: qty 6.82

## 2016-07-30 MED ORDER — AMIODARONE LOAD VIA INFUSION
150.0000 mg | Freq: Once | INTRAVENOUS | Status: DC
  Filled 2016-07-30: qty 83.34

## 2016-07-30 MED ORDER — PROPOFOL 500 MG/50ML IV EMUL
INTRAVENOUS | Status: DC | PRN
  Administered 2016-07-30: 65 ug/kg/min via INTRAVENOUS

## 2016-07-30 NOTE — Progress Notes (Signed)
DAILY PROGRESS NOTE   Patient Name: Erin [name] Date of Encounter: 07/30/2016  Hospital Problem List   Principal Problem:   Sepsis [facility]) Active Problems:   Spinal stenosis at L4-L5 level   Dyslipidemia   Acute midline low back pain without sciatica   Hypokalemia   CAD in native artery   Hx of CABG   AKI (acute kidney injury) ([place])   Hyponatremia   Persistent atrial fibrillation ([place])   Spinal abscess (HCC)   Acute on chronic diastolic (congestive) heart failure (HCC)   Atrial flutter (HCC)   Normocytic anemia   Paroxysmal atrial fibrillation (HCC)   Diskitis   Back pain at L4-L5 level   Epidural abscess   Fluid collection at surgical site   Acute blood loss anemia   Post-operative pain   Atrial fibrillation ([place])    Chief Complaint   "Still short of breath"  Subjective   I./O suggests volume overloaded, however, weight down 1 lb since yesterday with diuretics. Hypokalemic today -being repleted. Plan for TEE/DCCV today - she is on Eliquis and amiodarone gtts.  Objective   Vitals:   07/29/16 0456 07/29/16 1236 07/29/16 1938 07/30/16 0552  BP: 133/88 (!) 147/82 125/81 140/87  Pulse: (!) 126 94 (!) 122 (!) 125  Resp: '20 20 20 18  ' Temp: 98 F (36.7 C) 98.2 F (36.8 C) 98.2 F (36.8 C) 97.7 F (36.5 C)  TempSrc: Oral Oral Oral Oral  SpO2: 96% 97% 95% 95%  Weight: 176 lb 5.9 oz (80 kg)   175 lb 0.7 oz (79.4 kg)  Height:        Intake/Output Summary (Last 24 hours) at 07/30/16 1041 Last data filed at 07/30/16 0600  Gross per 24 hour  Intake            673.9 ml  Output              300 ml  Net            373.9 ml   Filed Weights   07/28/16 0401 07/29/16 0456 07/30/16 0552  Weight: 176 lb 11.2 oz (80.2 kg) 176 lb 5.9 oz (80 kg) 175 lb 0.7 oz (79.4 kg)    Physical Exam   General appearance: alert and no distress Neck: no carotid bruit and no JVD Lungs: diminished breath sounds bilaterally and wheezes LUL and RUL Heart: regular  tachycardia Abdomen: soft, non-tender; bowel sounds normal; no masses,  no organomegaly Extremities: extremities normal, atraumatic, no cyanosis or edema Pulses: 2+ and symmetric Skin: Skin color, texture, turgor normal. No rashes or lesions Neurologic: Grossly normal Psych: Pleasant  Inpatient Medications    Scheduled Meds: . apixaban  5 mg Oral BID  . aspirin EC  81 mg Oral Daily  . atorvastatin  80 mg Oral q1800  . cholecalciferol  2,000 Units Oral QPC breakfast  . cycloSPORINE  1 drop Both Eyes BID  . diltiazem  60 mg Oral Q6H  . ferrous sulfate  325 mg Oral BID WC  . furosemide  40 mg Oral Daily  . levothyroxine  112 mcg Oral QAC breakfast  . lidocaine  1 patch Transdermal Q24H  . magnesium chloride  1 tablet Oral Daily  . mouth rinse  15 mL Mouth Rinse BID  . multivitamin with minerals  1 tablet Oral Daily  . mupirocin ointment   Topical BID  . pantoprazole  40 mg Oral QHS  . polyethylene glycol  17 g Oral BID  . senna-docusate  2 tablet Oral BID  . sodium chloride flush  3 mL Intravenous Q12H  . sodium chloride flush  3 mL Intravenous Q12H  . sodium chloride flush  3 mL Intravenous Q12H    Continuous Infusions: . sodium chloride 250 mL (07/11/16 0700)  . sodium chloride    . sodium chloride    . sodium chloride    . amiodarone 30 mg/hr (07/29/16 1346)  .  ceFAZolin (ANCEF) IV 2 g (07/30/16 0300)  . lactated ringers 10 mL/hr at 07/18/16 1825  . potassium phosphate IVPB (mEq) 30 mEq (07/30/16 0941)    PRN Meds: acetaminophen **OR** acetaminophen, alum & mag hydroxide-simeth, cyclobenzaprine, HYDROcodone-acetaminophen, ipratropium-albuterol, ketorolac, levalbuterol, menthol-cetylpyridinium **OR** phenol, metoprolol tartrate, naLOXone (NARCAN)  injection, ondansetron **OR** [DISCONTINUED] ondansetron (ZOFRAN) IV, sodium chloride flush, sodium chloride flush, sodium chloride flush, sodium chloride flush, tiZANidine, white petrolatum   Labs   Results for orders  placed or performed during the hospital encounter of 06/21/16 (from the past 48 hour(s))  Magnesium     Status: Abnormal   Collection Time: 07/29/16  4:34 AM  Result Value Ref Range   Magnesium 1.6 (L) 1.7 - 2.4 mg/dL  Basic metabolic panel     Status: Abnormal   Collection Time: 07/29/16  2:30 PM  Result Value Ref Range   Sodium 129 (L) 135 - 145 mmol/L   Potassium 3.1 (L) 3.5 - 5.1 mmol/L   Chloride 91 (L) 101 - 111 mmol/L   CO2 30 22 - 32 mmol/L   Glucose, Bld 106 (H) 65 - 99 mg/dL   BUN <5 (L) 6 - 20 mg/dL   Creatinine, Ser 0.84 0.44 - 1.00 mg/dL   Calcium 8.2 (L) 8.9 - 10.3 mg/dL   GFR calc non Af Amer >60 >60 mL/min   GFR calc Af Amer >60 >60 mL/min    Comment: (NOTE) The eGFR has been calculated using the CKD EPI equation. This calculation has not been validated in all clinical situations. eGFR's persistently <60 mL/min signify possible Chronic Kidney Disease.    Anion gap 8 5 - 15  Magnesium     Status: None   Collection Time: 07/30/16  3:59 AM  Result Value Ref Range   Magnesium 1.7 1.7 - 2.4 mg/dL  Basic metabolic panel     Status: Abnormal   Collection Time: 07/30/16  3:59 AM  Result Value Ref Range   Sodium 132 (L) 135 - 145 mmol/L   Potassium 2.7 (LL) 3.5 - 5.1 mmol/L    Comment: CRITICAL RESULT CALLED TO, READ BACK BY AND VERIFIED WITH: STOPHEL A,RN 07/30/16 0512 WAYK    Chloride 92 (L) 101 - 111 mmol/L   CO2 32 22 - 32 mmol/L   Glucose, Bld 100 (H) 65 - 99 mg/dL   BUN 5 (L) 6 - 20 mg/dL   Creatinine, Ser 0.82 0.44 - 1.00 mg/dL   Calcium 8.1 (L) 8.9 - 10.3 mg/dL   GFR calc non Af Amer >60 >60 mL/min   GFR calc Af Amer >60 >60 mL/min    Comment: (NOTE) The eGFR has been calculated using the CKD EPI equation. This calculation has not been validated in all clinical situations. eGFR's persistently <60 mL/min signify possible Chronic Kidney Disease.    Anion gap 8 5 - 15  CBC     Status: Abnormal   Collection Time: 07/30/16  3:59 AM  Result Value Ref  Range   WBC 7.5 4.0 - 10.5 K/uL   RBC 3.34 (L) 3.87 -  5.11 MIL/uL   Hemoglobin 9.5 (L) 12.0 - 15.0 g/dL   HCT 29.1 (L) 36.0 - 46.0 %   MCV 87.1 78.0 - 100.0 fL   MCH 28.4 26.0 - 34.0 pg   MCHC 32.6 30.0 - 36.0 g/dL   RDW 17.6 (H) 11.5 - 15.5 %   Platelets 147 (L) 150 - 400 K/uL    ECG   None today  Telemetry   Atrial flutter at 130 - Personally Reviewed  Radiology    No results found.  Cardiac Studies   N/A  Assessment   1. Principal Problem: 2.   Sepsis ([place]) 3. Active Problems: 4.   Spinal stenosis at L4-L5 level 5.   Dyslipidemia 6.   Acute midline low back pain without sciatica 7.   Hypokalemia 8.   CAD in native artery 9.   Hx of CABG 10.   AKI (acute kidney injury) ([place]) 11.   Hyponatremia 12.   Persistent atrial fibrillation ([place]) 13.   Spinal abscess ([place]) 14.   Acute on chronic diastolic (congestive) heart failure ([place]) 15.   Atrial flutter ([place]) 16.   Normocytic anemia 17.   Paroxysmal atrial fibrillation (HCC) 18.   Diskitis 19.   Back pain at L4-L5 level 20.   Epidural abscess 21.   Fluid collection at surgical site 22.   Acute blood loss anemia 23.   Post-operative pain 24.   Atrial fibrillation ([place]) 25.   Plan   1. Remains in atrial flutter at 130. On IV amidarone - Eliquis, LAA clip in place (See TEE on 07/04/2016) - EF 40-45% at the time. Plan for TEE/DCCV today at 1400. Reviewed procedure, including risks, benefits and alternatives with her and she is agreeable to proceed.  Time Spent Directly with Patient:  15 minutes  Length of Stay:  LOS: 39 days   Pixie Casino, MD, [place]  Attending Cardiologist  Direct Dial: [phone]  Fax: [phone]  Website:  [email or web address] 07/30/2016, 10:41 AM
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
## 2016-07-30 NOTE — Anesthesia Preprocedure Evaluation (Addendum)
Anesthesia Evaluation  Patient identified by MRN, date of birth, ID band Patient awake    Reviewed: Allergy & Precautions, H&P , NPO status , Patient's Chart, lab work & pertinent test results, reviewed documented beta blocker date and time   Airway Mallampati: II  TM Distance: >3 FB Neck ROM: Full    Dental no notable dental hx. (+) Teeth Intact, Dental Advisory Given   Pulmonary neg pulmonary ROS,    Pulmonary exam normal breath sounds clear to auscultation       Cardiovascular hypertension, Pt. on medications and Pt. on home beta blockers + CAD, + CABG and +CHF  + dysrhythmias Atrial Fibrillation  Rhythm:Irregular Rate:Tachycardia     Neuro/Psych  Headaches, negative psych ROS   GI/Hepatic Neg liver ROS, GERD  Medicated and Controlled,  Endo/Other  Hypothyroidism   Renal/GU negative Renal ROS  negative genitourinary   Musculoskeletal  (+) Arthritis , Osteoarthritis,    Abdominal   Peds  Hematology negative hematology ROS (+) anemia ,   Anesthesia Other Findings   Reproductive/Obstetrics negative OB ROS                            Anesthesia Physical Anesthesia Plan  ASA: III  Anesthesia Plan: General   Post-op Pain Management:    Induction: Intravenous  PONV Risk Score and Plan: 3 and Propofol and Treatment may vary due to age or medical condition  Airway Management Planned: Nasal Cannula  Additional Equipment:   Intra-op Plan:   Post-operative Plan:   Informed Consent: I have reviewed the patients History and Physical, chart, labs and discussed the procedure including the risks, benefits and alternatives for the proposed anesthesia with the patient or authorized representative who has indicated his/her understanding and acceptance.   Dental advisory given  Plan Discussed with: CRNA  Anesthesia Plan Comments:         Anesthesia Quick Evaluation

## 2016-07-30 NOTE — Transfer of Care (Signed)
Immediate Anesthesia Transfer of Care Note  Patient: Erin [name]  Procedure(s) Performed: Procedure(s): TRANSESOPHAGEAL ECHOCARDIOGRAM (TEE) (N/A) CARDIOVERSION (N/A)  Patient Location: PACU  Anesthesia Type:General  Level of Consciousness: awake, alert  and oriented  Airway & Oxygen Therapy: Patient Spontanous Breathing  Post-op Assessment: Report given to RN, Post -op Vital signs reviewed and stable and Patient moving all extremities X 4  Post vital signs: Reviewed and stable  Last Vitals:  Vitals:   07/30/16 0552 07/30/16 1337  BP: 140/87 (!) 153/82  Pulse: (!) 125 (!) 120  Resp: 18 (!) 21  Temp: 36.5 C 36.5 C    Last Pain:  Vitals:   07/30/16 1337  TempSrc: Oral  PainSc:       Patients Stated Pain Goal: 2 (07/29/16 1718)  Complications: No apparent anesthesia complications

## 2016-07-30 NOTE — CV Procedure (Signed)
TEE/DCC Anesthesia:  [place] Propofol On iv amiodarone On Eliquis  EF 45% Mild MR Moderate TR LAE RAE LAA clip with thrombus behind it no connection to LA by 2D and color flow Normal AV Mild aortic debris  DCC x1 120 J converted from flutter rate 122 to NSR with PAC;s rate 78  No immediate neurologic sequelae  Charlton Haws, MD

## 2016-07-30 NOTE — Interval H&P Note (Signed)
History and Physical Interval Note:  07/30/2016 1:43 PM  Erin [name]  has presented today for surgery, with the diagnosis of a fib  The various methods of treatment have been discussed with the patient and family. After consideration of risks, benefits and other options for treatment, the patient has consented to  Procedure(s): TRANSESOPHAGEAL ECHOCARDIOGRAM (TEE) (N/A) CARDIOVERSION (N/A) as a surgical intervention .  The patient's history has been reviewed, patient examined, no change in status, stable for surgery.  I have reviewed the patient's chart and labs.  Questions were answered to the patient's satisfaction.     Charlton Haws

## 2016-07-30 NOTE — Progress Notes (Signed)
CRITICAL VALUE ALERT  Critical Value K+ 2.7  Date & Time Notied: 07/30/16 0525  Provider Notified:opyd  Orders Received/Actions taken: orders given

## 2016-07-30 NOTE — Progress Notes (Signed)
    Patient was on IV amio and successfully cardioverted to NSR today. RN calling me about continuing IV amio. I will stop this and start a load of PO amio 400mg  BID.   Marland KitchenCline Crock PA-C  [facility]

## 2016-07-30 NOTE — Progress Notes (Signed)
PT Cancellation Note  Patient Details Name: Erin [name] MRN: [id number] DOB: Jul 02, 1945   Cancelled Treatment:    Reason Eval/Treat Not Completed: Medical issues which prohibited therapy (pt with K of 2.7, HR of 129 at rest and scheduled for TEE/cardioversion today. Will hold at this time due to medical status)   [name] B [name] 07/30/2016, 9:31 AM  [name], PT [phone]

## 2016-07-30 NOTE — H&P (View-Only) (Signed)
Progress Note  Patient Name: Erin [name] Date of Encounter: 07/26/2016  Primary Cardiologist: Tresa Endo  Subjective   Still with some dyspnea "cold"   Inpatient Medications    Scheduled Meds: . apixaban  5 mg Oral BID  . aspirin EC  81 mg Oral Daily  . atorvastatin  80 mg Oral q1800  . cholecalciferol  2,000 Units Oral QPC breakfast  . cycloSPORINE  1 drop Both Eyes BID  . diltiazem  60 mg Oral Q6H  . ferrous sulfate  325 mg Oral BID WC  . furosemide  40 mg Oral Daily  . levothyroxine  112 mcg Oral QAC breakfast  . lidocaine  1 patch Transdermal Q24H  . magnesium chloride  1 tablet Oral Daily  . mouth rinse  15 mL Mouth Rinse BID  . multivitamin with minerals  1 tablet Oral Daily  . mupirocin ointment   Topical BID  . pantoprazole  40 mg Oral QHS  . polyethylene glycol  17 g Oral BID  . senna-docusate  2 tablet Oral BID  . sodium chloride flush  3 mL Intravenous Q12H  . sodium chloride flush  3 mL Intravenous Q12H  . sodium chloride flush  3 mL Intravenous Q12H   Continuous Infusions: . sodium chloride 250 mL (07/11/16 0700)  . sodium chloride    . sodium chloride    . amiodarone 30 mg/hr (07/26/16 0008)  .  ceFAZolin (ANCEF) IV Stopped (07/26/16 0145)  . lactated ringers 10 mL/hr at 07/18/16 1825   PRN Meds: acetaminophen **OR** acetaminophen, alum & mag hydroxide-simeth, cyclobenzaprine, HYDROcodone-acetaminophen, ipratropium-albuterol, ketorolac, levalbuterol, menthol-cetylpyridinium **OR** phenol, metoprolol tartrate, naLOXone (NARCAN)  injection, ondansetron **OR** [DISCONTINUED] ondansetron (ZOFRAN) IV, sodium chloride flush, sodium chloride flush, sodium chloride flush, sodium chloride flush, tiZANidine, white petrolatum   Vital Signs    Vitals:   07/25/16 1325 07/25/16 1419 07/25/16 2044 07/26/16 0533  BP: (!) 145/91 127/83 (!) 147/93 134/84  Pulse: (!) 126 (!) 116 (!) 113 (!) 121  Resp: 18 16 18 18   Temp: 98.2 F (36.8 C)  98.3 F (36.8 C) 97.5 F (36.4  C)  TempSrc: Oral  Oral Oral  SpO2: 98% 96% 98% 96%  Weight:    177 lb 4 oz (80.4 kg)  Height:        Intake/Output Summary (Last 24 hours) at 07/26/16 0958 Last data filed at 07/26/16 0800  Gross per 24 hour  Intake               60 ml  Output                0 ml  Net               60 ml   Filed Weights   07/24/16 0505 07/25/16 0347 07/26/16 0533  Weight: 181 lb 3.5 oz (82.2 kg) 178 lb 9.2 oz (81 kg) 177 lb 4 oz (80.4 kg)    Telemetry    Flutter rate 122 07/26/2016  Personally Reviewed  ECG    Flutter nonspecific ST changes - Personally Reviewed  Physical Exam   GEN: Chronically ill white female .   Neck: No JVD Cardiac: RRR, no murmurs, rubs, or gallops.  Respiratory: poor inspiratory effort  GI: Soft, nontender, non-distended  MS: No edema; No deformity. Neuro:  Nonfocal  Psych: Normal affect  Post lumbar back surgery dressing dry   Labs    Chemistry Recent Labs Lab 07/22/16 0415 07/23/16 0312 07/24/16 0409 07/25/16 0400  NA 129* 130* 129* 130*  K 3.7 3.6 3.5 3.5  CL 92* 95* 90* 91*  CO2 28 27 30 30   GLUCOSE 105* 106* 111* 104*  BUN 7 6 <5* 5*  CREATININE 1.06* 0.91 0.89 0.82  CALCIUM 7.6* 7.9* 7.9* 8.1*  ALBUMIN 1.6* 1.8* 1.9*  --   GFRNONAA 51* >60 >60 >60  GFRAA 59* >60 >60 >60  ANIONGAP 9 8 9 9      Hematology Recent Labs Lab 07/23/16 0312 07/24/16 0409 07/25/16 0400  WBC 7.6 7.9 8.6  RBC 3.36* 3.47* 3.41*  HGB 9.5* 9.6* 9.5*  HCT 29.2* 30.0* 29.5*  MCV 86.9 86.5 86.5  MCH 28.3 27.7 27.9  MCHC 32.5 32.0 32.2  RDW 16.9* 17.1* 17.1*  PLT 163 160 156    Cardiac EnzymesNo results for input(s): TROPONINI in the last 168 hours. No results for input(s): TROPIPOC in the last 168 hours.   BNPNo results for input(s): BNP, PROBNP in the last 168 hours.   DDimer No results for input(s): DDIMER in the last 168 hours.   Radiology    No results found.  Cardiac Studies   TEE/DCCL:  07/04/16  EF 40-45% moderate MR LAA clip  intact  Patient Profile     71 y.o. female with sepsis and lumbar surgery with debridement. Recurrent Flutter   Assessment & Plan    1) Still with elevated flutter despite increased cardizem and iv amiodarone.  Tentatively Schedule TEE/DCC for Monday if she does not convert will rebolus with amiodarone 2) CHF recheck CXR and BNP continue lasix 40 mg daily  3. Sepsis. Needs 6 weeks antibiotics continue ancef  Signed, Charlton Haws, MD  07/26/2016, 9:58 AM

## 2016-07-30 NOTE — Progress Notes (Signed)
OT Cancellation Note  Patient Details Name: Erin [name] MRN: [id number] DOB: [birth year]   Cancelled Treatment:    Reason Eval/Treat Not Completed: Medical issues which prohibited therapy. Pt with resting HR of 129 and K of 2.7. Will continue to follow.  Evern Bio 07/30/2016, 10:29 AM  [phone]

## 2016-07-31 ENCOUNTER — Encounter (HOSPITAL_COMMUNITY): Payer: Self-pay | Admitting: Cardiovascular Disease

## 2016-07-31 LAB — MAGNESIUM: Magnesium: 1.8 mg/dL (ref 1.7–2.4)

## 2016-07-31 LAB — BASIC METABOLIC PANEL
Anion gap: 8 (ref 5–15)
BUN: 6 mg/dL (ref 6–20)
CO2: 31 mmol/L (ref 22–32)
Calcium: 8 mg/dL — ABNORMAL LOW (ref 8.9–10.3)
Chloride: 91 mmol/L — ABNORMAL LOW (ref 101–111)
Creatinine, Ser: 0.86 mg/dL (ref 0.44–1.00)
GFR calc Af Amer: >60 mL/min (ref >60)
GFR calc non Af Amer: >60 mL/min (ref >60)
Glucose, Bld: 105 mg/dL — ABNORMAL HIGH (ref 65–99)
Potassium: 3.1 mmol/L — ABNORMAL LOW (ref 3.5–5.1)
Sodium: 130 mmol/L — ABNORMAL LOW (ref 135–145)

## 2016-07-31 MED ORDER — AMIODARONE HCL 400 MG PO TABS: 400.0000 mg | ORAL_TABLET | Freq: Two times a day (BID) | ORAL | 0 refills | Status: DC

## 2016-07-31 MED ORDER — FERROUS SULFATE 325 (65 FE) MG PO TABS: 325.0000 mg | ORAL_TABLET | Freq: Two times a day (BID) | ORAL | 0 refills | Status: DC

## 2016-07-31 MED ORDER — POTASSIUM CHLORIDE CRYS ER 20 MEQ PO TBCR
40.0000 meq | EXTENDED_RELEASE_TABLET | ORAL | Status: AC
  Administered 2016-07-31: 40 meq via ORAL
  Filled 2016-07-31: qty 2

## 2016-07-31 MED ORDER — POTASSIUM CHLORIDE CRYS ER 20 MEQ PO TBCR
40.0000 meq | EXTENDED_RELEASE_TABLET | Freq: Once | ORAL | Status: AC
  Administered 2016-07-31: 40 meq via ORAL
  Filled 2016-07-31: qty 2

## 2016-07-31 MED ORDER — DILTIAZEM HCL 60 MG PO TABS: 60.0000 mg | ORAL_TABLET | Freq: Four times a day (QID) | ORAL | 0 refills | Status: DC

## 2016-07-31 MED ORDER — LIDOCAINE 5 % EX PTCH: 1.0000 | MEDICATED_PATCH | CUTANEOUS | 0 refills | Status: DC

## 2016-07-31 MED ORDER — PANTOPRAZOLE SODIUM 40 MG PO TBEC: 40.0000 mg | DELAYED_RELEASE_TABLET | Freq: Every day | ORAL | 0 refills | Status: AC

## 2016-07-31 MED ORDER — AMIODARONE HCL 400 MG PO TABS: 400.0000 mg | ORAL_TABLET | Freq: Every day | ORAL | 0 refills | Status: DC

## 2016-07-31 MED ORDER — MAGNESIUM CHLORIDE 64 MG PO TBEC: 2.0000 | DELAYED_RELEASE_TABLET | Freq: Every day | ORAL | 0 refills | Status: DC

## 2016-07-31 MED ORDER — MAGNESIUM CHLORIDE 64 MG PO TBEC
2.0000 | DELAYED_RELEASE_TABLET | Freq: Every day | ORAL | Status: DC
  Administered 2016-08-01: 128 mg via ORAL
  Filled 2016-07-31: qty 2

## 2016-07-31 MED ORDER — MAGNESIUM SULFATE 2 GM/50ML IV SOLN
2.0000 g | Freq: Once | INTRAVENOUS | Status: AC
  Administered 2016-07-31: 2 g via INTRAVENOUS
  Filled 2016-07-31: qty 50

## 2016-07-31 MED ORDER — CEFAZOLIN SODIUM-DEXTROSE 2-4 GM/100ML-% IV SOLN: 2.0000 g | Freq: Three times a day (TID) | INTRAVENOUS | 0 refills | Status: AC

## 2016-07-31 MED ORDER — HYDROCODONE-ACETAMINOPHEN 7.5-325 MG/15ML PO SOLN: 10.0000 mL | ORAL | 0 refills | Status: DC | PRN

## 2016-07-31 MED ORDER — CYCLOBENZAPRINE HCL 10 MG PO TABS: 10.0000 mg | ORAL_TABLET | Freq: Three times a day (TID) | ORAL | 0 refills | Status: DC | PRN

## 2016-07-31 MED ORDER — TIZANIDINE HCL 4 MG PO TABS: 4.0000 mg | ORAL_TABLET | Freq: Three times a day (TID) | ORAL | 0 refills | Status: DC | PRN
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
## 2016-07-31 NOTE — Progress Notes (Addendum)
DAILY PROGRESS NOTE   Patient Name: Erin [name] Date of Encounter: 07/31/2016  Hospital Problem List   Principal Problem:   Sepsis [facility]) Active Problems:   Spinal stenosis at L4-L5 level   Dyslipidemia   Acute midline low back pain without sciatica   Hypokalemia   CAD in native artery   Hx of CABG   AKI (acute kidney injury) ([place])   Hyponatremia   Persistent atrial fibrillation ([place])   Spinal abscess (HCC)   Acute on chronic diastolic (congestive) heart failure (HCC)   Atrial flutter (HCC)   Normocytic anemia   Paroxysmal atrial fibrillation (HCC)   Diskitis   Back pain at L4-L5 level   Epidural abscess   Fluid collection at surgical site   Acute blood loss anemia   Post-operative pain   Atrial fibrillation [facility])    Chief Complaint   Feels better today, wants to go to rehab  Subjective   TEE/DCCV yesterday- EF stable at 40-45%, successfully cardioverted back to sinus rhythm. Now on amiodarone. Remains on Eliquis. Maintained sinus overnight.  Objective   Vitals:   07/30/16 1509 07/30/16 1519 07/30/16 2004 07/31/16 0415  BP: 129/73 129/73 119/68 129/61  Pulse: 79 76 78 80  Resp: 18 (!) _0 Temp: 97.9 F (36.6 C)  97.7 F (36.5 C) 98.3 F (36.8 C)  TempSrc: Oral  Oral Oral  SpO2: 92% 96% 92% 92%  Weight:    171 lb 15.3 oz (78 kg)  Height:        Intake/Output Summary (Last 24 hours) at 07/31/16 1035 Last data filed at 07/31/16 0800  Gross per 24 hour  Intake             1030 ml  Output                0 ml  Net             1030 ml   Filed Weights   07/29/16 0456 07/30/16 0552 07/31/16 0415  Weight: 176 lb 5.9 oz (80 kg) 175 lb 0.7 oz (79.4 kg) 171 lb 15.3 oz (78 kg)    Physical Exam   General appearance: alert and no distress Lungs: clear to auscultation bilaterally Heart: regular rate and rhythm Extremities: extremities normal, atraumatic, no cyanosis or edema Neurologic: Grossly normal Psych: Pleasant  Inpatient Medications      Scheduled Meds: . amiodarone  400 mg Oral BID  . apixaban  5 mg Oral BID  . aspirin EC  81 mg Oral Daily  . atorvastatin  80 mg Oral q1800  . cholecalciferol  2,000 Units Oral QPC breakfast  . cycloSPORINE  1 drop Both Eyes BID  . diltiazem  60 mg Oral Q6H  . ferrous sulfate  325 mg Oral BID WC  . furosemide  40 mg Oral Daily  . levothyroxine  112 mcg Oral QAC breakfast  . lidocaine  1 patch Transdermal Q24H  . magnesium chloride  1 tablet Oral Daily  . mouth rinse  15 mL Mouth Rinse BID  . multivitamin with minerals  1 tablet Oral Daily  . mupirocin ointment   Topical BID  . pantoprazole  40 mg Oral QHS  . polyethylene glycol  17 g Oral BID  . senna-docusate  2 tablet Oral BID  . sodium chloride flush  3 mL Intravenous Q12H  . sodium chloride flush  3 mL Intravenous Q12H  . sodium chloride flush  3 mL Intravenous Q12H  Continuous Infusions: . sodium chloride 250 mL (07/11/16 0700)  . sodium chloride    . sodium chloride    .  ceFAZolin (ANCEF) IV 2 g (07/31/16 0927)  . lactated ringers 10 mL/hr at 07/18/16 1825    PRN Meds: acetaminophen **OR** acetaminophen, alum & mag hydroxide-simeth, cyclobenzaprine, HYDROcodone-acetaminophen, ipratropium-albuterol, ketorolac, levalbuterol, menthol-cetylpyridinium **OR** phenol, metoprolol tartrate, naLOXone (NARCAN)  injection, ondansetron **OR** [DISCONTINUED] ondansetron (ZOFRAN) IV, sodium chloride flush, sodium chloride flush, sodium chloride flush, sodium chloride flush, tiZANidine, white petrolatum   Labs   Results for orders placed or performed during the hospital encounter of 06/21/16 (from the past 48 hour(s))  Basic metabolic panel     Status: Abnormal   Collection Time: 07/29/16  2:30 PM  Result Value Ref Range   Sodium 129 (L) 135 - 145 mmol/L   Potassium 3.1 (L) 3.5 - 5.1 mmol/L   Chloride 91 (L) 101 - 111 mmol/L   CO2 30 22 - 32 mmol/L   Glucose, Bld 106 (H) 65 - 99 mg/dL   BUN <5 (L) 6 - 20 mg/dL   Creatinine,  Ser 0.84 0.44 - 1.00 mg/dL   Calcium 8.2 (L) 8.9 - 10.3 mg/dL   GFR calc non Af Amer >60 >60 mL/min   GFR calc Af Amer >60 >60 mL/min    Comment: (NOTE) The eGFR has been calculated using the CKD EPI equation. This calculation has not been validated in all clinical situations. eGFR's persistently <60 mL/min signify possible Chronic Kidney Disease.    Anion gap 8 5 - 15  Magnesium     Status: None   Collection Time: 07/30/16  3:59 AM  Result Value Ref Range   Magnesium 1.7 1.7 - 2.4 mg/dL  Basic metabolic panel     Status: Abnormal   Collection Time: 07/30/16  3:59 AM  Result Value Ref Range   Sodium 132 (L) 135 - 145 mmol/L   Potassium 2.7 (LL) 3.5 - 5.1 mmol/L    Comment: CRITICAL RESULT CALLED TO, READ BACK BY AND VERIFIED WITH: STOPHEL A,RN 07/30/16 0512 WAYK    Chloride 92 (L) 101 - 111 mmol/L   CO2 32 22 - 32 mmol/L   Glucose, Bld 100 (H) 65 - 99 mg/dL   BUN 5 (L) 6 - 20 mg/dL   Creatinine, Ser 0.82 0.44 - 1.00 mg/dL   Calcium 8.1 (L) 8.9 - 10.3 mg/dL   GFR calc non Af Amer >60 >60 mL/min   GFR calc Af Amer >60 >60 mL/min    Comment: (NOTE) The eGFR has been calculated using the CKD EPI equation. This calculation has not been validated in all clinical situations. eGFR's persistently <60 mL/min signify possible Chronic Kidney Disease.    Anion gap 8 5 - 15  CBC     Status: Abnormal   Collection Time: 07/30/16  3:59 AM  Result Value Ref Range   WBC 7.5 4.0 - 10.5 K/uL   RBC 3.34 (L) 3.87 - 5.11 MIL/uL   Hemoglobin 9.5 (L) 12.0 - 15.0 g/dL   HCT 29.1 (L) 36.0 - 46.0 %   MCV 87.1 78.0 - 100.0 fL   MCH 28.4 26.0 - 34.0 pg   MCHC 32.6 30.0 - 36.0 g/dL   RDW 17.6 (H) 11.5 - 15.5 %   Platelets 147 (L) 150 - 400 K/uL  Magnesium     Status: None   Collection Time: 07/31/16  3:26 AM  Result Value Ref Range   Magnesium 1.8 1.7 - 2.4 mg/dL  Basic metabolic panel     Status: Abnormal   Collection Time: 07/31/16  3:26 AM  Result Value Ref Range   Sodium 130 (L) 135 -  145 mmol/L   Potassium 3.1 (L) 3.5 - 5.1 mmol/L   Chloride 91 (L) 101 - 111 mmol/L   CO2 31 22 - 32 mmol/L   Glucose, Bld 105 (H) 65 - 99 mg/dL   BUN 6 6 - 20 mg/dL   Creatinine, Ser 0.86 0.44 - 1.00 mg/dL   Calcium 8.0 (L) 8.9 - 10.3 mg/dL   GFR calc non Af Amer >60 >60 mL/min   GFR calc Af Amer >60 >60 mL/min    Comment: (NOTE) The eGFR has been calculated using the CKD EPI equation. This calculation has not been validated in all clinical situations. eGFR's persistently <60 mL/min signify possible Chronic Kidney Disease.    Anion gap 8 5 - 15    ECG   None  Telemetry   NSR- Personally Reviewed  Radiology    No results found.  Cardiac Studies   LV EF: 40% -   45%  ------------------------------------------------------------------- Indications:      Atrial flutter 427.32.  ------------------------------------------------------------------- History:   PMH:   Atrial flutter.  Coronary artery disease.  ------------------------------------------------------------------- Study Conclusions  - Left ventricle: Systolic function was mildly to moderately   reduced. The estimated ejection fraction was in the range of 40%   to 45%. Diffuse hypokinesis. No evidence of thrombus. - Ventricular septum: Septal motion showed abnormal function and   dyssynergy. - Mitral valve: There was mild regurgitation. - Left atrium: LAA clip intact with thrombus distal and no   connection to LA by 2D or color flow. The atrium was dilated. No   evidence of thrombus in the atrial cavity or appendage. - Right atrium: The atrium was mildly dilated. - Atrial septum: There was increased thickness of the septum,   consistent with lipomatous hypertrophy. No defect or patent   foramen ovale was identified. - Tricuspid valve: There was moderate regurgitation. - Impressions: DCC:   Propofol Anesthesia Dr Sheppard Evens   Single 120 J biphasic shock   Converted from atrial flutter rate 122 to NSR rate 78  with PAC;s   No immediate neurologic sequelae   On Rx Eliquis   On iv amiodarone with additional 150 mg bolus given due to   frequent PAC;s  Impressions:  - DCC:   Propofol Anesthesia Dr Sheppard Evens   Single 120 J biphasic shock   Converted from atrial flutter rate 122 to NSR rate 78 with PAC;s   No immediate neurologic sequelae   On Rx Eliquis   On iv amiodarone with additional 150 mg bolus given due to   frequent PAC;s Successful cardioversion. No cardiac source of   emboli was indentified.  Assessment   1. Principal Problem: 2.   Sepsis ([place]) 3. Active Problems: 4.   Spinal stenosis at L4-L5 level 5.   Dyslipidemia 6.   Acute midline low back pain without sciatica 7.   Hypokalemia 8.   CAD in native artery 9.   Hx of CABG 10.   AKI (acute kidney injury) ([place]) 11.   Hyponatremia 12.   Persistent atrial fibrillation ([place]) 13.   Spinal abscess ([place]) 14.   Acute on chronic diastolic (congestive) heart failure ([place]) 15.   Atrial flutter ([place]) 16.   Normocytic anemia 17.   Paroxysmal atrial fibrillation (HCC) 18.   Diskitis 19.   Back pain at L4-L5 level 20.  Epidural abscess 21.   Fluid collection at surgical site 22.   Acute blood loss anemia 23.   Post-operative pain 24.   Atrial fibrillation ([place]) 25.   Plan   1. Remains in sinus today - now on amiodarone 400 mg BID - would continue for 1 week, then decrease to 400 mg daily. Need to monitor TSH as she has been hypothyroid in the past. Can follow-up after rehab with Dr. Claiborne Billings. K 3.1 today and Mag 1.8. Will replete today, increase magnesium to 2 tablets daily tomorrow - may need supplemental daily potassium as well. No further suggestions at this time. Cardiology will sign-off. Call with questions.  Time Spent Directly with Patient:  15 minutes  Length of Stay:  LOS: 40 days   Pixie Casino, MD, [place]  Attending Cardiologist  Direct Dial: [phone]  Fax: [phone]    Website:  [email or web address] 07/31/2016, 10:35 AM

## 2016-07-31 NOTE — Discharge Summary (Signed)
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 ventral epidural abscess at L3. Resolution of fluid collections surrounding the pedicle screws. Enlarging 3.1 x 1.1 cm LEFT paraspinal fluid collection along the surgical approach, now tracking to the surgical bed. The previous 3.8 x 2 cm fluid collection within the surgical bed is now 2.5 x 2 cm with thick irregular marginal enhancement. Mild symmetric iliopsoas muscle denervation versus myositis. Paraspinal muscle denervation versus myositis. Ectatic infrarenal aorta. DISC LEVELS: L1-2: Small broad-based disc bulge. Mild facet arthropathy and ligamentum flavum redundancy without canal stenosis. Mild neural foraminal narrowing. Small broad-based disc bulge. Mild to moderate facet arthropathy and ligamentum flavum redundancy without canal stenosis. Mild bilateral neural foraminal narrowing. L3-4: No disc bulge. Progressively widened facets to 4 mm, limited assessment for enhancement due to hardware artifact. Mild canal stenosis. Mild to moderate bilateral neural foraminal narrowing. L4-5:  Posterior decompression. Mild thecal sac effacement due to epidural abscess. Posterior decompression. Phlegmon/abscess extending to the neural foramen bilaterally. L5-S1: PLIF. No canal stenosis. Phlegmon effacing the medial neural foramen bilaterally. IMPRESSION: New L3-4 discitis, with bilateral L3-4 facet effusions concerning for septic arthropathy. Similar epidural abscess. Consolidating abscess within the L4-5 surgical bed tracking into the paraspinal soft tissues. Surrounding paraspinal and iliopsoas myositis versus denervation. Mild canal stenosis L3-4. Phlegmon effacing the medial neural foramen at L4-5 and L5-S1. Electronically Signed   By: CElon AlasM.D.   On: 07/06/2016 21:40   Ir Fluoro Guide Ndl Plmt / Bx  Result Date: 07/12/2016 INDICATION: Instrumented lumbar fusion, with postop infection. Recent MR suggest progressive discitis L3-4. EXAM: FLUOROSCOPIC GUIDED LUMBAR DISC ASPIRATION MEDICATIONS: None. ANESTHESIA/SEDATION: Intravenous Fentanyl and Versed were administered as conscious sedation during continuous monitoring of the patient's level of consciousness and physiological / cardiorespiratory status by the radiology RN,  with a total moderate sedation time of 10 minutes. PROCEDURE: Informed written consent was obtained from the patient after a thorough discussion of the procedural risks, benefits and alternatives. All questions were addressed. Maximal Sterile Barrier Technique was utilized including caps, mask, sterile gowns, sterile gloves, sterile drape, hand hygiene and skin antiseptic. A timeout was performed prior to the initiation of the procedure. Under fluoroscopic guidance, a 16 gauge trocar needle was advanced into the central third of the L3-4 interspace from a right parasagittal approach, needle position confirmed on AP and lateral fluoroscopy. Approximately 5 mL of thin blood-tinged purulent fluid were aspirated, sent for the requested laboratory studies. The patient  tolerated the procedure well. FLUOROSCOPY TIME:  30 seconds, 13 mGy COMPLICATIONS: None immediate. IMPRESSION: 1. Technically successful fluoroscopic guided lumbar L3-4 disc aspiration. Sample sent for Gram stain and culture Electronically Signed   By: Lucrezia Europe M.D.   On: 07/12/2016 13:46   Dg Chest Port 1 View  Result Date: 07/19/2016 CLINICAL DATA:  Postoperative shortness of breath. EXAM: PORTABLE CHEST 1 VIEW COMPARISON:  Chest radiograph Jun 29, 2016 FINDINGS: Cardiac silhouette is mildly enlarged and unchanged. Status post median sternotomy for CABG. Catheter looped over LEFT chest, suspected to be external to the patient. LEFT PICC distal tip projects in mid superior vena cava, unchanged. Similar bronchitic changes. Increasing RIGHT midlung zone atelectasis with patchy consolidation LEFT mid and lower lung zone. Trace LEFT pleural effusion. No pneumothorax. Status post CABG. Surgical clips at GE junction. IMPRESSION: Increasing LEFT mid and lower lung zone consolidations, increasing RIGHT midlung zone atelectasis. Similar bronchitic changes. Electronically Signed   By: Elon Alas M.D.   On: 07/19/2016 02:09   Dg C-arm 1-60 Min  Result Date: 07/19/2016 CLINICAL DATA:  Revision of lumbosacral spinal fusion at L2-S1. EXAM: LUMBAR SPINE - 2-3 VIEW COMPARISON:  CT of the lumbar spine performed 07/09/2016 FINDINGS: Two fluoroscopic C-arm images are provided from the OR, demonstrating lumbosacral pedicle screws. Underlying decompression is again noted. IMPRESSION: Revision of lumbosacral spinal fusion at L2-S1. Electronically Signed   By: Garald Balding M.D.   On: 07/19/2016 05:25    Microbiology: No results found for this or any previous visit (from the past 240 hour(s)).   Labs: Basic Metabolic Panel:  Recent Labs Lab 07/27/16 0413 07/28/16 0519 07/29/16 0434 07/29/16 1430 07/30/16 0359 07/31/16 0326  NA 129* 129*  --  129* 132* 130*  K 3.0* 3.5  --  3.1* 2.7* 3.1*  CL 91* 91*   --  91* 92* 91*  CO2 29 30  --  30 32 31  GLUCOSE 111* 113*  --  106* 100* 105*  BUN 5* 6  --  <5* 5* 6  CREATININE 0.83 0.84  --  0.84 0.82 0.86  CALCIUM 8.0* 8.0*  --  8.2* 8.1* 8.0*  MG 1.5* 2.2 1.6*  --  1.7 1.8   Liver Function Tests: No results for input(s): AST, ALT, ALKPHOS, BILITOT, PROT, ALBUMIN in the last 168 hours. No results for input(s): LIPASE, AMYLASE in the last 168 hours. No results for input(s): AMMONIA in the last 168 hours. CBC:  Recent Labs Lab 07/25/16 0400 07/30/16 0359  WBC 8.6 7.5  HGB 9.5* 9.5*  HCT 29.5* 29.1*  MCV 86.5 87.1  PLT 156 147*   Cardiac Enzymes: No results for input(s): CKTOTAL, CKMB, CKMBINDEX, TROPONINI in the last 168 hours. BNP: BNP (last 3 results)  Recent Labs  07/28/16 0519  BNP 371.5*    ProBNP (  last 3 results) No results for input(s): PROBNP in the last 8760 hours.  CBG: No results for input(s): GLUCAP in the last 168 hours.   Signed:  Velvet Bathe MD.  [facility] 07/31/2016, 2:04 PM

## 2016-07-31 NOTE — Clinical Social Work Note (Addendum)
CSW notified pt will be medically stable to DC to rehab today.  CSW contacted [facility], spoke to Monette in admissions who reported they have gotten auth from [place] 4 times for pt and wanted to make sure pt is really stable to DC. CSW advised per MD pt is medically stable today.  Marcelino Duster reports she will start auth process with [facility] and notify CSW when she has received approval.  CSW will continue to follow.  Square Jowett B. Gean Quint Clinical Social Work Dept Weekend Social Worker [phone] 11:23 AM

## 2016-07-31 NOTE — Anesthesia Postprocedure Evaluation (Signed)
Anesthesia Post Note  Patient: Erin [name]  Procedure(s) Performed: Procedure(s) (LRB): TRANSESOPHAGEAL ECHOCARDIOGRAM (TEE) (N/A) CARDIOVERSION (N/A)     Anesthesia Post Evaluation  Last Vitals:  Vitals:   07/30/16 2004 07/31/16 0415  BP: 119/68 129/61  Pulse: 78 80  Resp: 18 18  Temp: 36.5 C 36.8 C    Last Pain:  Vitals:   07/31/16 0454  TempSrc:   PainSc: Asleep                 [name]

## 2016-07-31 NOTE — Clinical Social Work Note (Signed)
CSW FU with [facility], per Kinnelon in admissions still awaiting auth from [place]. BSRN updated. CSW will continue to follow.  Jabria Loos B. Gean Quint Clinical Social Work Dept Weekend Social Worker [phone] 3:36 PM

## 2016-07-31 NOTE — Care Management Note (Signed)
Case Management Note Original Note Created by Fernande Boyden RN, BSN Unit 2W-Case Manager [phone]  Patient Details  Name: Erin [name] MRN: [id number] Date of Birth: 1945/04/25  Subjective/Objective:   Pt admitted with sepsis, AKI, plan for OR today 06/26/16                 Action/Plan: PTA pt was from home with son, recently discharged from SNF-rehab stay- uses RW. - CM to follow for d/c needs post op - per Essie Hart. RNCM- pt made HRI with [facility] if pt  to go home with [facility] services- per [facility] mtg on 06/26/16   Expected Discharge Date:  07/31/16               Expected Discharge Plan:  Skilled Nursing Facility  In-House Referral:  Clinical Social Work  Discharge planning Services  CM Consult  Post Acute Care Choice:    Choice offered to:     DME Arranged:    DME Agency:     HH Arranged:    HH Agency:     Status of Service:  Completed, signed off  If discussed at [employer] of Stay Meetings, dates discussed:  5/22, 5/24, 6/5, 6/7, 6/12  Discharge Disposition: skilled facility   Additional Comments:  07/31/16- 1430- Niels Cranshaw RN, CM-  Pt stable for d/c today to SNF- CSW following for placement to [facility]-   07/27/16- 1200- Donn Pierini RN, CM- per Cards plan for Cardioversion on 07/30/16- pt to d/c to SNF when HR controlled- CSW continues to follow- pt will need to continue IV abx for total of 8 wks.   07/25/16- 1000 - Jeyden Coffelt RN, CM- pt was for d/c to SNF today- however has gone back into afib rate 130s- MD to have Cards re-consult for HR control- plan for IV amio drip.   07/24/16- 1500- Paylin Hailu RN, CM- CIR consulted for possible admission- have spoken with Genie at [facility]- consult completed however CIR does not have bed availability currently and per MD pt is medically stable for discharge- discussed this with pt at bedside along with CSW [place]- back up plan per pt and son is [facility]- CSW will proceed with SNF plan for d/c-  plan for d/c 07/25/16.   Cherylann Parr, RN 07/20/2016, 2:16 PM---Pt is now s/p extention of fusion.  Pt now with asymptomatic brady - holding lopressor, amio and cardizem.  IV lasix ordered.  CSW continuing to follow for SNF at discharge  07/13/16- 1200- Casara Perrier RN, CM- pt's stay has been complicated by afib and vol overload- s/p cardioversion. Pt had IR aspiration done 5/24 for further cultures- plan is for surgical stabilization next week ?5/30 per Dr. Wynetta Emery. CSW continues to follow for SNF when medically stable.  06/27/16- 1140- Nechama Escutia rN, CM- pt s/p reexploration of the lumbar wound for irrigation and debridement and evacuation of lumbar epidural abscess on 06/26/16- on 2H post op with order to tx today-  PT/OT evals pending- CM will follow for recommendations and d/c needs.   Darrold Span, RN 07/31/2016, 2:28 PM

## 2016-08-01 LAB — BASIC METABOLIC PANEL
Anion gap: 10 (ref 5–15)
BUN: 6 mg/dL (ref 6–20)
CO2: 29 mmol/L (ref 22–32)
Calcium: 8 mg/dL — ABNORMAL LOW (ref 8.9–10.3)
Chloride: 91 mmol/L — ABNORMAL LOW (ref 101–111)
Creatinine, Ser: 0.87 mg/dL (ref 0.44–1.00)
GFR calc Af Amer: >60 mL/min (ref >60)
GFR calc non Af Amer: >60 mL/min (ref >60)
Glucose, Bld: 115 mg/dL — ABNORMAL HIGH (ref 65–99)
Potassium: 3.3 mmol/L — ABNORMAL LOW (ref 3.5–5.1)
Sodium: 130 mmol/L — ABNORMAL LOW (ref 135–145)

## 2016-08-01 LAB — MAGNESIUM: Magnesium: 1.6 mg/dL — ABNORMAL LOW (ref 1.7–2.4)

## 2016-08-01 MED ORDER — POTASSIUM CHLORIDE CRYS ER 20 MEQ PO TBCR
40.0000 meq | EXTENDED_RELEASE_TABLET | ORAL | Status: AC
  Administered 2016-08-01 (×2): 40 meq via ORAL
  Filled 2016-08-01 (×2): qty 2

## 2016-08-01 MED ORDER — HYDROCODONE-ACETAMINOPHEN 7.5-325 MG/15ML PO SOLN: 10.0000 mL | ORAL | 0 refills | Status: DC | PRN

## 2016-08-01 MED ORDER — LIDOCAINE 5 % EX PTCH: 1.0000 | MEDICATED_PATCH | CUTANEOUS | 0 refills | Status: DC

## 2016-08-01 MED ORDER — MAGNESIUM SULFATE 4 GM/100ML IV SOLN
4.0000 g | Freq: Once | INTRAVENOUS | Status: AC
  Administered 2016-08-01: 4 g via INTRAVENOUS
  Filled 2016-08-01: qty 100

## 2016-08-01 MED ORDER — AMIODARONE HCL 400 MG PO TABS: 400.0000 mg | ORAL_TABLET | Freq: Every day | ORAL | 0 refills | Status: DC

## 2016-08-01 MED ORDER — POTASSIUM CHLORIDE CRYS ER 20 MEQ PO TBCR: 40.0000 meq | EXTENDED_RELEASE_TABLET | Freq: Every day | ORAL | Status: DC

## 2016-08-01 MED ORDER — BUTALBITAL-APAP-CAFFEINE 50-325-40 MG PO TABS: 1.0000 | ORAL_TABLET | Freq: Two times a day (BID) | ORAL | 0 refills | Status: DC | PRN

## 2016-08-01 MED ORDER — AMIODARONE HCL 400 MG PO TABS: 400.0000 mg | ORAL_TABLET | Freq: Two times a day (BID) | ORAL | 0 refills | Status: DC

## 2016-08-01 MED ORDER — HEPARIN SOD (PORK) LOCK FLUSH 100 UNIT/ML IV SOLN
250.0000 [IU] | INTRAVENOUS | Status: AC | PRN
  Administered 2016-08-01: 250 [IU]

## 2016-08-01 MED ORDER — CYCLOBENZAPRINE HCL 10 MG PO TABS: 10.0000 mg | ORAL_TABLET | Freq: Three times a day (TID) | ORAL | 0 refills | Status: DC | PRN
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
## 2016-08-01 NOTE — Care Management Important Message (Signed)
Important Message  Patient Details  Name: Erin [name] MRN: [id number] Date of Birth: 1945-06-08   Medicare Important Message Given:  Yes    [name] 08/01/2016, 2:18 PM

## 2016-08-01 NOTE — Clinical Social Work Note (Signed)
CSW facilitated patient discharge including contacting facility to confirm patient discharge plans. Patient declined to have CSW call family, stating that she would call her son. Clinical information faxed to facility and family agreeable with plan. CSW arranged ambulance transport via [facility] to [facility]. RN to call report prior to discharge [phone]).  CSW will sign off for now as social work intervention is no longer needed. Please consult [place] again if new needs arise.  Charlynn Court, CSW [phone]

## 2016-08-01 NOTE — Clinical Social Work Placement (Signed)
   CLINICAL SOCIAL WORK PLACEMENT  NOTE  Date:  08/01/2016  Patient Details  Name: Erin [name] MRN: [id number] Date of Birth: 1946-01-07  Clinical Social Work is seeking post-discharge placement for this patient at the Skilled  Nursing Facility level of care (*CSW will initial, date and re-position this form in  chart as items are completed):  Yes   Patient/family provided with [place] Clinical Social Work Department's list of facilities offering this level of care within the geographic area requested by the patient (or if unable, by the patient's family).  Yes   Patient/family informed of their freedom to choose among providers that offer the needed level of care, that participate in Medicare, Medicaid or managed care program needed by the patient, have an available bed and are willing to accept the patient.  Yes   Patient/family informed of 's ownership interest in [facility] and [facility], as well as of the fact that they are under no obligation to receive care at these facilities.  PASRR submitted to EDS on 08/01/16     PASRR number received on       Existing PASRR number confirmed on 08/01/16     FL2 transmitted to all facilities in geographic area requested by pt/family on       FL2 transmitted to all facilities within larger geographic area on       Patient informed that his/her managed care company has contracts with or will negotiate with certain facilities, including the following:        Yes   Patient/family informed of bed offers received.  Patient chooses bed at [facility]     Physician recommends and patient chooses bed at      Patient to be transferred to [facility] on 08/01/16.  Patient to be transferred to facility by [facility]     Patient family notified on 08/01/16 of transfer.  Name of family member notified:  Patient declined to have CSW call family     PHYSICIAN Please sign FL2     Additional Comment:     _______________________________________________ Margarito Liner, LCSW 08/01/2016, 4:10 PM

## 2016-08-01 NOTE — Progress Notes (Signed)
Order received to discharge patient.  Telemetry monitor removed and CCMD notified.  PIV access removed.  PICC remains in place.  IV team will flush and cap for home.  Patient transferring to [facility] for rehab. Report called to facility.  [facility] has been called.
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
## 2016-08-01 NOTE — Discharge Summary (Addendum)
Physician Discharge Summary  Erin [name] NWG:[id number] DOB: September 21, 1945 DOA: 06/21/2016  PCP: Caffie Damme, MD  Admit date: 06/21/2016 Discharge date: 08/01/2016  Time spent: 40 minutes  Recommendations for Outpatient Follow-up:  1. See prior discharge summary dictated by Dr. Cena Benton 07/31/2016   Discharge Diagnoses:  Principal Problem:   Sepsis (HCC) Active Problems:   Spinal stenosis at L4-L5 level   Dyslipidemia   Acute midline low back pain without sciatica   Hypokalemia   CAD in native artery   Hx of CABG   AKI (acute kidney injury) (HCC)   Hyponatremia   Persistent atrial fibrillation (HCC)   Spinal abscess (HCC)   Acute on chronic diastolic (congestive) heart failure (HCC)   Atrial flutter (HCC)   Normocytic anemia   Paroxysmal atrial fibrillation (HCC)   Diskitis   Back pain at L4-L5 level   Epidural abscess   Fluid collection at surgical site   Acute blood loss anemia   Post-operative pain   Atrial fibrillation [facility])   Discharge Condition: Stable and improved  Diet recommendation: Heart healthy  Filed Weights   07/30/16 0552 07/31/16 0415 08/01/16 0356  Weight: 79.4 kg (175 lb 0.7 oz) 78 kg (171 lb 15.3 oz) 78.9 kg (173 lb 15.1 oz)    History of present illness:  See prior discharge summary dictated by Dr. Cena Benton 07/31/2016    Hospital Course:  Patient was to be discharged yesterday,however were awaiting insurance approval. Patient remained stable. Patient's electrolytes were repleted and home dose potassium increased from 20 Meq to 40 Meq daily. For rest of hospitalization: See prior discharge summary dictated by Dr. Cena Benton 07/31/2016  Procedures: See prior discharge summary dictated by Dr. Cena Benton 07/31/2016  Consultations:  See prior discharge summary dictated by Dr. Cena Benton 07/31/2016  Discharge Exam: Vitals:   08/01/16 0356 08/01/16 1345  BP: 139/64 140/70  Pulse: 86 83  Resp: 18 18  Temp: 98.5 F (36.9 C) 97.6 F (36.4 C)    General:  NAD Cardiovascular: RRR Respiratory: CTAB  Discharge Instructions   Discharge Instructions    Call MD for:  difficulty breathing, headache or visual disturbances    Complete by:  As directed    Call MD for:  severe uncontrolled pain    Complete by:  As directed    Call MD for:  temperature >100.4    Complete by:  As directed    Diet - low sodium heart healthy    Complete by:  As directed    Diet - low sodium heart healthy    Complete by:  As directed    Discharge instructions    Complete by:  As directed    Please ensure patient follows up with infectious disease specialist for further evaluation and recommendations. IV antibiotics to be completed July 4th. But there was mentions about consideration of oral antibiotics moving forward past that. Again recommend ID specialist follow up.   Increase activity slowly    Complete by:  As directed    Increase activity slowly    Complete by:  As directed      Current Discharge Medication List    START taking these medications   Details  ceFAZolin (ANCEF) 2-4 GM/100ML-% IVPB Inject 100 mLs (2 g total) into the vein every 8 (eight) hours. Qty: 1 each, Refills: 0    cyclobenzaprine (FLEXERIL) 10 MG tablet Take 1 tablet (10 mg total) by mouth 3 (three) times daily as needed for muscle spasms. Qty: 30 tablet, Refills: 0  diltiazem (CARDIZEM) 60 MG tablet Take 1 tablet (60 mg total) by mouth every 6 (six) hours. Qty: 120 tablet, Refills: 0    ferrous sulfate 325 (65 FE) MG tablet Take 1 tablet (325 mg total) by mouth 2 (two) times daily with a meal. Qty: 90 tablet, Refills: 0    HYDROcodone-acetaminophen (HYCET) 7.5-325 mg/15 ml solution Take 10 mLs by mouth every 4 (four) hours as needed for moderate pain. Qty: 120 mL, Refills: 0    lidocaine (LIDODERM) 5 % Place 1 patch onto the skin daily. Remove & Discard patch within 12 hours or as directed by MD Qty: 30 patch, Refills: 0    magnesium chloride (SLOW-MAG) 64 MG TBEC SR tablet  Take 2 tablets (128 mg total) by mouth daily. Qty: 60 tablet, Refills: 0    pantoprazole (PROTONIX) 40 MG tablet Take 1 tablet (40 mg total) by mouth at bedtime. Qty: 30 tablet, Refills: 0      CONTINUE these medications which have CHANGED   Details  !! amiodarone (PACERONE) 400 MG tablet Take 1 tablet (400 mg total) by mouth 2 (two) times daily. Qty: 14 tablet, Refills: 0    !! amiodarone (PACERONE) 400 MG tablet Take 1 tablet (400 mg total) by mouth daily. Qty: 30 tablet, Refills: 0    butalbital-acetaminophen-caffeine (FIORICET, ESGIC) 50-325-40 MG tablet Take 1 tablet by mouth 2 (two) times daily as needed for migraine. Qty: 14 tablet, Refills: 0    potassium chloride SA (K-DUR,KLOR-CON) 20 MEQ tablet Take 2 tablets (40 mEq total) by mouth daily.     !! - Potential duplicate medications found. Please discuss with provider.    CONTINUE these medications which have NOT CHANGED   Details  acetaminophen (TYLENOL) 325 MG tablet Take 650 mg by mouth every 4 (four) hours as needed for mild pain or fever.    apixaban (ELIQUIS) 5 MG TABS tablet Take 1 tablet (5 mg total) by mouth 2 (two) times daily. Qty: 60 tablet, Refills: 1    aspirin EC 81 MG EC tablet Take 1 tablet (81 mg total) by mouth daily.    atorvastatin (LIPITOR) 80 MG tablet Take 1 tablet (80 mg total) by mouth daily at 6 PM.    Biotin 10 MG CAPS Take 10 mg by mouth daily.    bisacodyl (DULCOLAX) 10 MG suppository Place 10 mg rectally as needed for moderate constipation.    Cholecalciferol (VITAMIN D) 2000 units CAPS Take 2,000 Units by mouth daily after breakfast.    cycloSPORINE (RESTASIS) 0.05 % ophthalmic emulsion Place 1 drop into both eyes 2 (two) times daily.    furosemide (LASIX) 40 MG tablet Take 1 tablet (40 mg total) by mouth every other day. Qty: 30 tablet    levothyroxine (SYNTHROID, LEVOTHROID) 112 MCG tablet Take 112 mcg by mouth daily before breakfast.    Multiple Vitamin (MULTIVITAMIN WITH  MINERALS) TABS tablet Take 1 tablet by mouth daily.      STOP taking these medications     amLODipine (NORVASC) 5 MG tablet      HYDROcodone-acetaminophen (NORCO) 10-325 MG tablet      LORazepam (ATIVAN) 0.5 MG tablet      Magnesium 250 MG TABS      magnesium hydroxide (MILK OF MAGNESIA) 400 MG/5ML suspension      metoprolol (LOPRESSOR) 50 MG tablet      ondansetron (ZOFRAN) 4 MG tablet      RABEprazole (ACIPHEX) 20 MG tablet      sodium phosphate (FLEET)  7-19 GM/118ML ENEM      triamterene-hydrochlorothiazide (MAXZIDE-25) 37.5-25 MG tablet      zolpidem (AMBIEN) 10 MG tablet        Allergies  Allergen Reactions  . Ace Inhibitors Swelling    ACE stopped after pt seen in ED with facial swelling- allergy testing pending  . Ambien [Zolpidem] Other (See Comments)    confusion  . Morphine And Related   . Codeine Nausea And Vomiting   Contact information for after-discharge care    Destination    [facility] Follow up.   Specialty:  Skilled Nursing Facility Contact information: [address] [place] [address] [phone]               The results of significant diagnostics from this hospitalization (including imaging, microbiology, ancillary and laboratory) are listed below for reference.    Significant Diagnostic Studies: Ct Abdomen Pelvis Wo Contrast  Result Date: 07/23/2016 CLINICAL DATA:  Post lumbar surgery with epidural abscess and wound exploration. Question retroperitoneal bleed. EXAM: CT ABDOMEN AND PELVIS WITHOUT CONTRAST TECHNIQUE: Multidetector CT imaging of the abdomen and pelvis was performed following the standard protocol without IV contrast. COMPARISON:  Abdominal ultrasound 06/25/2016, no dedicated abdominal CT. Chest CT performed 07/20/2011 FINDINGS: Lower chest: Decreased size of left pleural effusion from prior chest CTA, small volume of pleural fluid persists. There completely resolved right  pleural effusion. Linear atelectasis in both lower lobes. Post median sternotomy with atherosclerosis of native coronary arteries. Hepatobiliary: No focal hepatic lesion allowing for lack contrast. High-density material in the gallbladder may be sludge/ stones are vicarious excretion of IV contrast, no pericholecystic inflammation. Pancreas: Parenchymal atrophy. No ductal dilatation or inflammation. Spleen: Normal in size without focal abnormality. Splenule anteriorly. Adrenals/Urinary Tract: Trace thickening of the left adrenal gland without dominant nodule. Right adrenal gland is normal. No hydronephrosis. Lobular bilateral renal contours with mild renal cortical thinning. The ureters are decompressed. Urinary bladder is physiologically distended without wall thickening. Stomach/Bowel: Stomach is physiologically distended. No bowel obstruction, inflammation or evidence of wall thickening. Small volume of colonic stool. Appendix tentatively identified and normal. Vascular/Lymphatic: No evidence of retroperitoneal bleed. Symmetric appearance of iliopsoas muscles, no soft tissue stranding. Aortic and branch atherosclerosis. No aneurysm. No definite adenopathy. Streak artifact from lumbar spine hardware partially obscures retroperitoneal evaluation. Reproductive: Uterus and bilateral adnexa are unremarkable. Other: No ascites or free air.  There is whole body wall edema. Musculoskeletal: Postsurgical change in the lumbar spine with posterior fusion L2 through S1. Examination is not tailored for detailed spine evaluation. L4 pedicle screws have been removed since prior lumbar spine CT. IMPRESSION: 1. No evidence of retroperitoneal bleed or acute abnormality in the abdomen/pelvis. 2. High-density material in the gallbladder may be combination of sludge/stones and vicarious excretion of IV contrast from prior chest CT. No gallbladder inflammation. 3. Decreased pleural effusions from prior chest CT, near completely  resolved on the right. Electronically Signed   By: Rubye Oaks M.D.   On: 07/23/2016 21:59   Dg Chest 2 View  Result Date: 07/27/2016 CLINICAL DATA:  Shortness of Breath EXAM: CHEST  2 VIEW COMPARISON:  Chest radiograph Jul 19, 2016 and chest CT Jul 19, 2016 FINDINGS: There has been clearing of airspace consolidation from the left mid lung and left base. There is mild midlung atelectatic change bilaterally. There is a small left pleural effusion. Lungs elsewhere clear. Heart remains mildly prominent with pulmonary vascularity within normal limits. Central catheter tip is  in the superior vena cava. Patient is status post coronary artery bypass grafting. Left atrial appendage clamp is present. There is postoperative change in the lower cervical spine. There is no evident adenopathy. No evident pneumothorax. IMPRESSION: Interval clearing of infiltrate bilaterally. Mild mid lung atelectasis bilaterally with small left pleural effusion. Lungs elsewhere clear. Stable cardiac prominence. Postoperative changes as noted. Central catheter tip in superior vena cava. No evident pneumothorax. Electronically Signed   By: Bretta Bang III M.D.   On: 07/27/2016 15:10   Dg Lumbar Spine 2-3 Views  Result Date: 07/19/2016 CLINICAL DATA:  Revision of lumbosacral spinal fusion at L2-S1. EXAM: LUMBAR SPINE - 2-3 VIEW COMPARISON:  CT of the lumbar spine performed 07/09/2016 FINDINGS: Two fluoroscopic C-arm images are provided from the OR, demonstrating lumbosacral pedicle screws. Underlying decompression is again noted. IMPRESSION: Revision of lumbosacral spinal fusion at L2-S1. Electronically Signed   By: Roanna Raider M.D.   On: 07/19/2016 05:25   Ct Angio Chest Pe W Or Wo Contrast  Result Date: 07/19/2016 CLINICAL DATA:  Elevated D-dimer.  Decreased oxygen saturation. EXAM: CT ANGIOGRAPHY CHEST WITH CONTRAST TECHNIQUE: Multidetector CT imaging of the chest was performed using the standard protocol during bolus  administration of intravenous contrast. Multiplanar CT image reconstructions and MIPs were obtained to evaluate the vascular anatomy. CONTRAST:  50 mL Isovue 370 COMPARISON:  Chest radiograph 07/19/2016.  Chest CTA 05/25/2016. FINDINGS: Cardiovascular: Pulmonary arterial opacification is adequate, however motion artifact limits segmental and subsegmental evaluation in multiple regions bilaterally. There is no lobar or more central pulmonary embolus, and no convincing segmental emboli are identified within limitations of motion. A left PICC terminates in the SVC. There is mild cardiomegaly. Sequelae of prior CABG are identified, and there is three-vessel coronary artery atherosclerosis. Previously described bulging of the proximal descending thoracic aorta is not well seen on the current examination due to motion artifact and diminished contrast opacification of the aorta. Mediastinum/Nodes: No enlarged axillary, mediastinal, or hilar lymph nodes. Scattered small mediastinal lymph nodes are larger than on the prior CTA but benign/ reactive in appearance. Lungs/Pleura: Small pleural effusions, left larger than right. Evaluation of the lung parenchyma is limited by respiratory motion artifact and expiratory phase imaging. Mosaic attenuation in the upper lobes may reflect air trapping in the setting of expiratory phase imaging. Bandlike opacities in both upper lobes and along the fissures bilaterally suggest atelectasis. There is also evidence of volume loss and atelectasis in both lower lobes. Eventration of the right hemidiaphragm is noted. Upper Abdomen: Vicarious excretion of contrast in the gallbladder. Musculoskeletal: Partially visualized drain in the posterior soft tissues of the lower thoracic spine. Prior anterior fusion in the lower cervical spine. Old L1 superior endplate compression fracture/ Schmorl's node. Thoracic spondylosis. Bilateral glenohumeral osteoarthrosis. Review of the MIP images confirms the  above findings. IMPRESSION: 1. No definite segmental or more central pulmonary emboli identified. 2. Expiratory imaging with respiratory motion artifact and atelectasis bilaterally. 3. Small pleural effusions. 4.  Aortic Atherosclerosis (ICD10-I70.0). Electronically Signed   By: Sebastian Ache M.D.   On: 07/19/2016 09:37   Ct Lumbar Spine Wo Contrast  Result Date: 07/09/2016 CLINICAL DATA:  Low back pain.  Discitis. EXAM: CT LUMBAR SPINE WITHOUT CONTRAST TECHNIQUE: Multidetector CT imaging of the lumbar spine was performed without intravenous contrast administration. Multiplanar CT image reconstructions were also generated. COMPARISON:  Lumbar spine MRI 07/06/2016 and CT 05/24/2016 FINDINGS: Segmentation: Normal. Alignment: Trace retrolisthesis of L1 on L2, unchanged. Vertebrae: Multiple Schmorl's nodes as  previously seen with an old compression fracture at L1. L2 and L3 vertebral body hemangiomas. Prior L4-S1 posterior and interbody fusion. Prominent lucency about the L4 screws has progressed from the prior CT, most notably posteriorly. The screws again terminate at the level of the L4 superior endplate. There is also mild lucency about the left L5 screw which has mildly increased. Subsidence of the L4-5 interbody cages into the superior L5 vertebral body does not appear significantly changed from the prior CT. Mild Schmorl's node deformity and mild erosive change in the L3 inferior endplate are new from the prior CT but similar in appearance to the recent MRI. Widening of the L3-4 facet joints bilaterally corresponds to fluid on the recent MRI with mild cortical erosive changes seen. Paraspinal and other soft tissues: Small left pleural effusion is partially visualized along with bibasilar atelectasis. Small layering stones are noted in the gallbladder. The paravertebral inflammatory changes are seen at L3-4 without discrete so last abscess on this unenhanced study. Postoperative changes and fluid collection  posteriorly at L4-5 are better evaluated on the recent prior MRI. Epidural abscess described on MRI cannot be adequately evaluated on this study. Disc levels: Assessment of degenerative changes deferred to recent MRI. IMPRESSION: 1. Increased lucency about both L4 pedicle screws and L3-4 disc and endplate changes compatible with known infection. 2. Lucency about the left L5 screw which may reflect new loosening or infection. 3. Bilateral L3-4 facet joint widening and erosion compatible with septic arthritis. Electronically Signed   By: Sebastian Ache M.D.   On: 07/09/2016 10:49   Mr Lumbar Spine W Wo Contrast  Result Date: 07/06/2016 CLINICAL DATA:  Worsening low back pain after incision and drainage of lumbar spine epidural abscess May 8th. EXAM: MRI LUMBAR SPINE WITHOUT AND WITH CONTRAST TECHNIQUE: Multiplanar and multiecho pulse sequences of the lumbar spine were obtained without and with intravenous contrast. CONTRAST:  17mL MULTIHANCE GADOBENATE DIMEGLUMINE 529 MG/ML IV SOLN COMPARISON:  MRI of lumbar spine with contrast May 7th 2018 and MRI of the lumbar spine without contrast Jun 21, 2016 FINDINGS: SEGMENTATION: For the purposes of this report, the last well-formed intervertebral disc will be described as L5-S1. ALIGNMENT: Maintenance of the lumbar lordosis. No malalignment. VERTEBRAE:New mild widening L3-4 disc space with RIGHT T2 signal within the disc, and mild marginal enhancement. Remaining discs demonstrate normal morphology with slight desiccation. Multilevel mild chronic discogenic endplate changes and scattered old Schmorl's nodes. Scattered hemangiomata. Status post L4-5 and L5-S1 PLIF, hardware results in susceptibility artifact. CONUS MEDULLARIS: Conus medullaris terminates at T12-L1 and demonstrates normal morphology and signal characteristics. Fuzzy appearance of the cauda equina without enhancement equivocal for arachnoiditis, similar. PARASPINAL AND SOFT TISSUES: Similar epidural enhancement  from L2 through the level of surgical intervention, with focal 15 x 6 mm residual ventral epidural abscess at L3. Resolution of fluid collections surrounding the pedicle screws. Enlarging 3.1 x 1.1 cm LEFT paraspinal fluid collection along the surgical approach, now tracking to the surgical bed. The previous 3.8 x 2 cm fluid collection within the surgical bed is now 2.5 x 2 cm with thick irregular marginal enhancement. Mild symmetric iliopsoas muscle denervation versus myositis. Paraspinal muscle denervation versus myositis. Ectatic infrarenal aorta. DISC LEVELS: L1-2: Small broad-based disc bulge. Mild facet arthropathy and ligamentum flavum redundancy without canal stenosis. Mild neural foraminal narrowing. Small broad-based disc bulge. Mild to moderate facet arthropathy and ligamentum flavum redundancy without canal stenosis. Mild bilateral neural foraminal narrowing. L3-4: No disc bulge. Progressively widened facets to  4 mm, limited assessment for enhancement due to hardware artifact. Mild canal stenosis. Mild to moderate bilateral neural foraminal narrowing. L4-5: Posterior decompression. Mild thecal sac effacement due to epidural abscess. Posterior decompression. Phlegmon/abscess extending to the neural foramen bilaterally. L5-S1: PLIF. No canal stenosis. Phlegmon effacing the medial neural foramen bilaterally. IMPRESSION: New L3-4 discitis, with bilateral L3-4 facet effusions concerning for septic arthropathy. Similar epidural abscess. Consolidating abscess within the L4-5 surgical bed tracking into the paraspinal soft tissues. Surrounding paraspinal and iliopsoas myositis versus denervation. Mild canal stenosis L3-4. Phlegmon effacing the medial neural foramen at L4-5 and L5-S1. Electronically Signed   By: Awilda Metro M.D.   On: 07/06/2016 21:40   Ir Fluoro Guide Ndl Plmt / Bx  Result Date: 07/12/2016 INDICATION: Instrumented lumbar fusion, with postop infection. Recent MR suggest progressive  discitis L3-4. EXAM: FLUOROSCOPIC GUIDED LUMBAR DISC ASPIRATION MEDICATIONS: None. ANESTHESIA/SEDATION: Intravenous Fentanyl and Versed were administered as conscious sedation during continuous monitoring of the patient's level of consciousness and physiological / cardiorespiratory status by the radiology RN, with a total moderate sedation time of 10 minutes. PROCEDURE: Informed written consent was obtained from the patient after a thorough discussion of the procedural risks, benefits and alternatives. All questions were addressed. Maximal Sterile Barrier Technique was utilized including caps, mask, sterile gowns, sterile gloves, sterile drape, hand hygiene and skin antiseptic. A timeout was performed prior to the initiation of the procedure. Under fluoroscopic guidance, a 16 gauge trocar needle was advanced into the central third of the L3-4 interspace from a right parasagittal approach, needle position confirmed on AP and lateral fluoroscopy. Approximately 5 mL of thin blood-tinged purulent fluid were aspirated, sent for the requested laboratory studies. The patient tolerated the procedure well. FLUOROSCOPY TIME:  30 seconds, 13 mGy COMPLICATIONS: None immediate. IMPRESSION: 1. Technically successful fluoroscopic guided lumbar L3-4 disc aspiration. Sample sent for Gram stain and culture Electronically Signed   By: Corlis Leak M.D.   On: 07/12/2016 13:46   Dg Chest Port 1 View  Result Date: 07/19/2016 CLINICAL DATA:  Postoperative shortness of breath. EXAM: PORTABLE CHEST 1 VIEW COMPARISON:  Chest radiograph Jun 29, 2016 FINDINGS: Cardiac silhouette is mildly enlarged and unchanged. Status post median sternotomy for CABG. Catheter looped over LEFT chest, suspected to be external to the patient. LEFT PICC distal tip projects in mid superior vena cava, unchanged. Similar bronchitic changes. Increasing RIGHT midlung zone atelectasis with patchy consolidation LEFT mid and lower lung zone. Trace LEFT pleural  effusion. No pneumothorax. Status post CABG. Surgical clips at GE junction. IMPRESSION: Increasing LEFT mid and lower lung zone consolidations, increasing RIGHT midlung zone atelectasis. Similar bronchitic changes. Electronically Signed   By: Awilda Metro M.D.   On: 07/19/2016 02:09   Dg C-arm 1-60 Min  Result Date: 07/19/2016 CLINICAL DATA:  Revision of lumbosacral spinal fusion at L2-S1. EXAM: LUMBAR SPINE - 2-3 VIEW COMPARISON:  CT of the lumbar spine performed 07/09/2016 FINDINGS: Two fluoroscopic C-arm images are provided from the OR, demonstrating lumbosacral pedicle screws. Underlying decompression is again noted. IMPRESSION: Revision of lumbosacral spinal fusion at L2-S1. Electronically Signed   By: Roanna Raider M.D.   On: 07/19/2016 05:25    Microbiology: No results found for this or any previous visit (from the past 240 hour(s)).   Labs: Basic Metabolic Panel:  Recent Labs Lab 07/28/16 0519 07/29/16 0434 07/29/16 1430 07/30/16 0359 07/31/16 0326 08/01/16 0338  NA 129*  --  129* 132* 130* 130*  K 3.5  --  3.1*  2.7* 3.1* 3.3*  CL 91*  --  91* 92* 91* 91*  CO2 30  --  30 32 31 29  GLUCOSE 113*  --  106* 100* 105* 115*  BUN 6  --  <5* 5* 6 6  CREATININE 0.84  --  0.84 0.82 0.86 0.87  CALCIUM 8.0*  --  8.2* 8.1* 8.0* 8.0*  MG 2.2 1.6*  --  1.7 1.8 1.6*   Liver Function Tests: No results for input(s): AST, ALT, ALKPHOS, BILITOT, PROT, ALBUMIN in the last 168 hours. No results for input(s): LIPASE, AMYLASE in the last 168 hours. No results for input(s): AMMONIA in the last 168 hours. CBC:  Recent Labs Lab 07/30/16 0359  WBC 7.5  HGB 9.5*  HCT 29.1*  MCV 87.1  PLT 147*   Cardiac Enzymes: No results for input(s): CKTOTAL, CKMB, CKMBINDEX, TROPONINI in the last 168 hours. BNP: BNP (last 3 results)  Recent Labs  07/28/16 0519  BNP 371.5*    ProBNP (last 3 results) No results for input(s): PROBNP in the last 8760 hours.  CBG: No results for input(s):  GLUCAP in the last 168 hours.     SignedRamiro Harvest MD.  [facility] 08/01/2016, 5:22 PM

## 2016-08-14 ENCOUNTER — Telehealth: Payer: Self-pay | Admitting: *Deleted

## 2016-08-14 ENCOUNTER — Ambulatory Visit (INDEPENDENT_AMBULATORY_CARE_PROVIDER_SITE_OTHER): Payer: Medicare HMO | Admitting: Nurse Practitioner

## 2016-08-14 ENCOUNTER — Encounter: Payer: Self-pay | Admitting: Nurse Practitioner

## 2016-08-14 VITALS — BP 150/80 | HR 81 | Ht 63.0 in

## 2016-08-14 DIAGNOSIS — I5022 Chronic systolic (congestive) heart failure: Secondary | ICD-10-CM | POA: Diagnosis not present

## 2016-08-14 DIAGNOSIS — I480 Paroxysmal atrial fibrillation: Secondary | ICD-10-CM

## 2016-08-14 DIAGNOSIS — Z79899 Other long term (current) drug therapy: Secondary | ICD-10-CM

## 2016-08-14 DIAGNOSIS — I259 Chronic ischemic heart disease, unspecified: Secondary | ICD-10-CM | POA: Diagnosis not present

## 2016-08-14 DIAGNOSIS — A4151 Sepsis due to Escherichia coli [E. coli]: Secondary | ICD-10-CM

## 2016-08-14 MED ORDER — AMIODARONE HCL 200 MG PO TABS: 200.0000 mg | ORAL_TABLET | Freq: Every day | ORAL | 3 refills | Status: DC

## 2016-08-14 NOTE — Patient Instructions (Addendum)
We will be checking the following labs today - BMET, BNP, CBC, TSH, HPF   Medication Instructions:    Continue with your current medicines. BUT  I am cutting the amiodarone back to 200 mg a day - starting tomrrow  I am increasing the Lasix to 60 mg for 3 days and then back to 40 mg    Testing/Procedures To Be Arranged:  N/A  Follow-Up:   See Dr. Eden Emms in a couple of weeks for recheck    Other Special Instructions:   N/A    If you need a refill on your cardiac medications before your next appointment, please call your pharmacy.   Call the [facility] office at [phone] if you have any questions, problems or concerns.

## 2016-08-14 NOTE — Telephone Encounter (Signed)
Faxing to [facility] at [phone] the ov note and physicians orders.

## 2016-08-14 NOTE — Progress Notes (Signed)
CARDIOLOGY OFFICE NOTE  Date:  08/14/2016    Erin [name] Date of Birth: 29-Aug-1945 Medical Record #[id number]  PCP:  Caffie Damme, MD  Cardiologist:  Tresa Endo switching to Dr. Eden Emms (Dr. Eden Emms saw in hospital)  Chief Complaint  Patient presents with  . Atrial Fibrillation    Post hospital visit - seen for Dr. Eden Emms    History of Present Illness: Erin [name] is a 71 y.o. female who presents today for a post hospital visit. Seen for Dr. Eden Emms. She previously saw Dr. Tresa Endo but wishes to switch to Dr. Eden Emms due to her recent hospitalization experience.   She has a hx of CAD/CABG and PAF. She has had recent CABG by Dr Dorris Fetch 05/30/16 with LAA clipping. Recent course complicated by Ecoli sepsis and paravertebral abscess post lumbar laminectomy in March with revision by Dr Wynetta Emery. 06/26/16.  She has had recurrent PAF - back on Eliquis PAF as well as loaded with amiodarone and has had TEE cardioversion 07/04/16 and there was thrombus in her LAA but clip was intact with no communication to her LA and no other thrombus. She went back into rapid atrial flutter with rates in the 120s. She was symptomatic with palpitations. She required repeat TEE cardioversion 07/2016 back to NSR. EF has dropped to 40-45% with moderate MR.  She is in a back brace and has some LE weakness from surgery and ? Iliopsoas inflammation from infection CT 6/4 with no retroperitoneal hematoma.   Comes in today. Here with her son. She has been to rehab for the past 2 weeks. Very slow progress. Not able to stand here today. Says her weight is 165. Having extreme back pain. Little short of breath. Nauseated. Tremor of the hands. Poor appetite. No chest pain. Lots of bruising. Has had a CBC last week - hgb was 9.3. Says TSH was 25 and her dose of Synthroid was increased to 125 mcg. She saw Dr. Wynetta Emery yesterday - apparently talk of another surgery?? Will be on antibiotics thru July 4th. Medicines verified by the nurse via phone  at the facility.   Past Medical History:  Diagnosis Date  . Arthritis    "all my back is eat up w/it; knees too" (05/24/2016)  . CAD (coronary artery disease)    s/p CABG  . Chronic bronchitis (HCC)   . Chronic lower back pain   . Dyspnea    "since OR 04/2016" (05/24/2016)  . Family history of adverse reaction to anesthesia    "daughter gets PONV" (05/24/2016)  . GERD (gastroesophageal reflux disease)    occ  . High cholesterol   . History of stomach ulcers   . Hypertension   . Hypothyroidism   . Migraine    "usually have one monthly; nothing since 04/25/2016)  . Pneumonia ~ 2002    Past Surgical History:  Procedure Laterality Date  . ANTERIOR CERVICAL DECOMP/DISCECTOMY FUSION  ~ 2009  . BACK SURGERY    . CARDIOVERSION N/A 07/04/2016   Procedure: CARDIOVERSION;  Surgeon: Wendall Stade, MD;  Location: [facility] ENDOSCOPY;  Service: Cardiovascular;  Laterality: N/A;  . CARDIOVERSION N/A 07/30/2016   Procedure: CARDIOVERSION;  Surgeon: Wendall Stade, MD;  Location: [facility] ENDOSCOPY;  Service: Cardiovascular;  Laterality: N/A;  . CARPAL TUNNEL RELEASE Bilateral 80's  . CORONARY ARTERY BYPASS GRAFT N/A 05/30/2016   Procedure: CORONARY ARTERY BYPASS GRAFTING (CABG), ON PUMP, TIMES FOUR, USING LEFT INTERNAL MAMMARY ARTERY AND ENDOSCOPICALLY HARVESTED BILATERAL GREATER SAPHENOUS VEINS WITH CLIPPING  OF LEFT ATRIAL APPENDAGE;  Surgeon: Loreli Slot, MD;  Location: [facility] OR;  Service: Open Heart Surgery;  Laterality: N/A;  LIMA to LAD, SVG to RAMUS INTERMEDIATE, and SVG SEQUENTIALLY to CIRCUMFLEX and PDA  . IR FLUORO GUIDED NEEDLE PLC ASPIRATION/INJECTION LOC  07/12/2016  . KNEE ARTHROSCOPY Bilateral 2000s  . LEFT HEART CATH AND CORONARY ANGIOGRAPHY N/A 05/29/2016   Procedure: Left Heart Cath and Coronary Angiography;  Surgeon: Lennette Bihari, MD;  Location: [facility] INVASIVE CV LAB;  Service: Cardiovascular;  Laterality: N/A;  . LUMBAR DISC SURGERY  2006; 07/2005  . LUMBAR WOUND DEBRIDEMENT N/A 06/26/2016    Procedure: Incision and Drainage of LUMBAR WOUND;  Surgeon: Donalee Citrin, MD;  Location: [facility] OR;  Service: Neurosurgery;  Laterality: N/A;  . POSTERIOR LUMBAR FUSION  04/2016  . TEE WITHOUT CARDIOVERSION N/A 07/04/2016   Procedure: TRANSESOPHAGEAL ECHOCARDIOGRAM (TEE);  Surgeon: Wendall Stade, MD;  Location: [facility] ENDOSCOPY;  Service: Cardiovascular;  Laterality: N/A;  . TEE WITHOUT CARDIOVERSION N/A 07/30/2016   Procedure: TRANSESOPHAGEAL ECHOCARDIOGRAM (TEE);  Surgeon: Wendall Stade, MD;  Location: [facility] ENDOSCOPY;  Service: Cardiovascular;  Laterality: N/A;  . TONSILLECTOMY    . TUBAL LIGATION       Medications: Current Meds  Medication Sig  . cyclobenzaprine (FLEXERIL) 10 MG tablet Take 1 tablet (10 mg total) by mouth 3 (three) times daily as needed for muscle spasms.  Marland Kitchen HYDROcodone-acetaminophen (HYCET) 7.5-325 mg/15 ml solution Take 10 mLs by mouth every 4 (four) hours as needed for moderate pain.  Marland Kitchen levothyroxine (SYNTHROID, LEVOTHROID) 112 MCG tablet Take 112 mcg by mouth daily before breakfast.     Allergies: Allergies  Allergen Reactions  . Ace Inhibitors Swelling    ACE stopped after pt seen in ED with facial swelling- allergy testing pending  . Ambien [Zolpidem] Other (See Comments)    confusion  . Morphine And Related   . Codeine Nausea And Vomiting    Social History: The patient  reports that she has never smoked. She has never used smokeless tobacco. She reports that she does not drink alcohol or use drugs.   Family History: The patient's family history includes CAD in her sister and son.   Review of Systems: Please see the history of present illness.   Otherwise, the review of systems is positive for none.   All other systems are reviewed and negative.   Physical Exam: VS:  BP (!) 150/80 (BP Location: Right Arm, Patient Position: Sitting, Cuff Size: Normal)   Pulse 81   Ht 5\' 3"  (1.6 m)  .  BMI There is no height or weight on file to calculate BMI.  Wt Readings  from Last 3 Encounters:  08/01/16 173 lb 15.1 oz (78.9 kg)  06/21/16 173 lb (78.5 kg)  06/20/16 173 lb 8 oz (78.7 kg)    General: Pleasant. She looks quite chronically ill. Her color is quite sallow. She is alert and in no acute distress.  Has a back brace in place. Lots of bruising. Fine tremor noted of the hands. She is having back pain.  HEENT: Normal.  Neck: Supple, no JVD, carotid bruits, or masses noted.  Cardiac: Regular rate and rhythm. No murmurs, rubs, or gallops. +1 edema.  Respiratory:  Lungs are clear to auscultation bilaterally with normal work of breathing.  GI: Soft and nontender.  MS: No deformity or atrophy. Gait not tested.  Skin: Warm and dry. Color is quite sallow.  Neuro:  Strength and sensation are intact  and no gross focal deficits noted.  Psych: Alert, appropriate and with normal affect.   LABORATORY DATA:  EKG:  EKG is ordered today. This demonstrates NSR prior inferior infarct, anteroseptal infarct.  Lab Results  Component Value Date   WBC 7.5 07/30/2016   HGB 9.5 (L) 07/30/2016   HCT 29.1 (L) 07/30/2016   PLT 147 (L) 07/30/2016   GLUCOSE 115 (H) 08/01/2016   ALT 19 06/21/2016   AST 31 06/21/2016   NA 130 (L) 08/01/2016   K 3.3 (L) 08/01/2016   CL 91 (L) 08/01/2016   CREATININE 0.87 08/01/2016   BUN 6 08/01/2016   CO2 29 08/01/2016   TSH 4.558 (H) 06/27/2016   INR 1.20 07/19/2016   HGBA1C 5.9 (H) 05/30/2016     BNP (last 3 results)  Recent Labs  07/28/16 0519  BNP 371.5*    ProBNP (last 3 results) No results for input(s): PROBNP in the last 8760 hours.   Other Studies Reviewed Today:  Echo Study Conclusions 07/2016  - Left ventricle: Systolic function was mildly to moderately reduced. The estimated ejection fraction was in the range of 40% to 45%. Diffuse hypokinesis. No evidence of thrombus. - Ventricular septum: Septal motion showed abnormal function and dyssynergy. - Mitral valve: There was mild regurgitation. - Left  atrium: LAA clip intact with thrombus distal and no connection to LA by 2D or color flow. The atrium was dilated. No evidence of thrombus in the atrial cavity or appendage. - Right atrium: The atrium was mildly dilated. - Atrial septum: There was increased thickness of the septum, consistent with lipomatous hypertrophy. No defect or patent foramen ovale was identified. - Tricuspid valve: There was moderate regurgitation. - Impressions: DCC: Propofol Anesthesia Dr Leanord Hawking Single 120 J biphasic shock Converted from atrial flutter rate 122 to NSR rate 78 with PAC;s No immediate neurologic sequelae On Rx Eliquis On iv amiodarone with additional 150 mg bolus given due to frequent PAC;s  Impressions:  - DCC: Propofol Anesthesia Dr Leanord Hawking Single 120 J biphasic shock Converted from atrial flutter rate 122 to NSR rate 78 with PAC;s No immediate neurologic sequelae On Rx Eliquis On iv amiodarone with additional 150 mg bolus given due to frequent PAC;s Successful cardioversion. No cardiac source of emboli was indentified.  Assessment/Plan: 1. PAF - on amiodarone - lots of somatic complaints which I suspect are related to the amiodarone - I am cutting back to 200 mg a day. Lab today. Will get her back to see Dr. Eden Emms. Fortunately she is holding in NSR. Remains on Eliquis.   2. Chronic systolic HF - swelling on exam. Some DOE - will up her lasix for a few days then cut back to her maintenance dose.   3. CAD - recent CABG - no active chest pain.   4. Ecchymosis - on aspirin and Eliquis - checking CBC today.   5. Recent sepsis/epidural abscess - on IV antibiotics. Overall course looks tenuous to me.   6. Hypothyroidism - rechecking today - she may not be able to take long term amiodarone.    Current medicines are reviewed with the patient today.  The patient does not have concerns regarding medicines other than what has been noted above.  The  following changes have been made:  See above.  Labs/ tests ordered today include:    Orders Placed This Encounter  Procedures  . Basic metabolic panel  . CBC  . Hepatic function panel  . TSH  . Pro b natriuretic  peptide (BNP)  . EKG 12-Lead     Disposition:   FU with Dr. Eden Emms in a couple of weeks with EKG.   Patient is agreeable to this plan and will call if any problems develop in the interim.   SignedNorma Fredrickson, NP  08/14/2016 3:53 PM  [facility] [address] [place], [place]  [address] Phone: [phone] Fax: [phone]

## 2016-08-15 ENCOUNTER — Other Ambulatory Visit: Payer: Self-pay | Admitting: Neurosurgery

## 2016-08-15 DIAGNOSIS — M4646 Discitis, unspecified, lumbar region: Secondary | ICD-10-CM

## 2016-08-15 LAB — HEPATIC FUNCTION PANEL
ALT: 3 IU/L (ref 0–32)
AST: 12 IU/L (ref 0–40)
Albumin: 3.2 g/dL — ABNORMAL LOW (ref 3.5–4.8)
Alkaline Phosphatase: 174 IU/L — ABNORMAL HIGH (ref 39–117)
Bilirubin Total: 0.4 mg/dL (ref 0.0–1.2)
Bilirubin, Direct: 0.13 mg/dL (ref 0.00–0.40)
Total Protein: 6.1 g/dL (ref 6.0–8.5)

## 2016-08-15 LAB — CBC
Hematocrit: 30.9 % — ABNORMAL LOW (ref 34.0–46.6)
Hemoglobin: 10.4 g/dL — ABNORMAL LOW (ref 11.1–15.9)
MCH: 29.5 pg (ref 26.6–33.0)
MCHC: 33.7 g/dL (ref 31.5–35.7)
MCV: 88 fL (ref 79–97)
Platelets: 176 10*3/uL (ref 150–379)
RBC: 3.53 x10E6/uL — ABNORMAL LOW (ref 3.77–5.28)
RDW: 18.9 % — ABNORMAL HIGH (ref 12.3–15.4)
WBC: 7.5 10*3/uL (ref 3.4–10.8)

## 2016-08-15 LAB — TSH: TSH: 12.59 u[IU]/mL — ABNORMAL HIGH (ref 0.450–4.500)

## 2016-08-15 LAB — BASIC METABOLIC PANEL
BUN/Creatinine Ratio: 9 — ABNORMAL LOW (ref 12–28)
BUN: 8 mg/dL (ref 8–27)
CO2: 25 mmol/L (ref 20–29)
Calcium: 8.8 mg/dL (ref 8.7–10.3)
Chloride: 92 mmol/L — ABNORMAL LOW (ref 96–106)
Creatinine, Ser: 0.93 mg/dL (ref 0.57–1.00)
GFR calc Af Amer: 71 mL/min/{1.73_m2} (ref >59)
GFR calc non Af Amer: 61 mL/min/{1.73_m2} (ref >59)
Glucose: 90 mg/dL (ref 65–99)
Potassium: 4.4 mmol/L (ref 3.5–5.2)
Sodium: 133 mmol/L — ABNORMAL LOW (ref 134–144)

## 2016-08-15 LAB — PRO B NATRIURETIC PEPTIDE: NT-Pro BNP: 1595 pg/mL — ABNORMAL HIGH (ref 0–301)

## 2016-08-21 NOTE — Progress Notes (Signed)
CARDIOLOGY OFFICE NOTE  Date:  08/27/2016    Erin [name] Date of Birth: 1945-11-28 Medical Record #[id number]  PCP:  Caffie Damme, MD  Cardiologist:  Tresa Endo switching to Dr. Eden Emms (Dr. Eden Emms saw in hospital)  No chief complaint on file.   History of Present Illness: Erin [name] is a 71 y.o. female previously seen by Dr Tresa Endo Complicated hospitalization in June 2018 with complications from spine surgery and PAF    She has a hx of CAD/CABG and PAF. She has had recent CABG by Dr Dorris Fetch 05/30/16 with LAA clipping. Recent course complicated by Ecoli sepsis and paravertebral abscess post lumbar laminectomy in March with revision by Dr Wynetta Emery. 06/26/16.  She has had recurrent PAF - back on Eliquis PAF as well as loaded with amiodarone and has had TEE cardioversion 07/04/16 and there was thrombus in her LAA but clip was intact with no communication to her LA and no other thrombus. She went back into rapid atrial flutter with rates in the 120s. She was symptomatic with palpitations. She required repeat TEE cardioversion 07/2016 back to NSR. EF has dropped to 40-45% with moderate MR.  She is in a back brace and has some LE weakness from surgery and ? Iliopsoas inflammation from infection CT 6/4 with no retroperitoneal hematoma.   Seen by PA 08/14/16 with multiple somatic complaints and slow rehab. Amiodarone dose lowered. Has had synthroid dose increased for TSH 25 Antibiotics stopped July 4th May need more surgery with Dr Wynetta Emery BNP elevated 08/14/16 1595 lasix dose  08/14/16  Increased Hct stable and 30.9 TSH down to 12.5   Past Medical History:  Diagnosis Date  . Arthritis    "all my back is eat up w/it; knees too" (05/24/2016)  . CAD (coronary artery disease)    s/p CABG  . Chronic bronchitis (HCC)   . Chronic lower back pain   . Dyspnea    "since OR 04/2016" (05/24/2016)  . Family history of adverse reaction to anesthesia    "daughter gets PONV" (05/24/2016)  . GERD (gastroesophageal reflux  disease)    occ  . High cholesterol   . History of stomach ulcers   . Hypertension   . Hypothyroidism   . Migraine    "usually have one monthly; nothing since 04/25/2016)  . Pneumonia ~ 2002    Past Surgical History:  Procedure Laterality Date  . ANTERIOR CERVICAL DECOMP/DISCECTOMY FUSION  ~ 2009  . BACK SURGERY    . CARDIOVERSION N/A 07/04/2016   Procedure: CARDIOVERSION;  Surgeon: Wendall Stade, MD;  Location: [facility] ENDOSCOPY;  Service: Cardiovascular;  Laterality: N/A;  . CARDIOVERSION N/A 07/30/2016   Procedure: CARDIOVERSION;  Surgeon: Wendall Stade, MD;  Location: [facility] ENDOSCOPY;  Service: Cardiovascular;  Laterality: N/A;  . CARPAL TUNNEL RELEASE Bilateral 80's  . CORONARY ARTERY BYPASS GRAFT N/A 05/30/2016   Procedure: CORONARY ARTERY BYPASS GRAFTING (CABG), ON PUMP, TIMES FOUR, USING LEFT INTERNAL MAMMARY ARTERY AND ENDOSCOPICALLY HARVESTED BILATERAL GREATER SAPHENOUS VEINS WITH CLIPPING OF LEFT ATRIAL APPENDAGE;  Surgeon: Loreli Slot, MD;  Location: [facility] OR;  Service: Open Heart Surgery;  Laterality: N/A;  LIMA to LAD, SVG to RAMUS INTERMEDIATE, and SVG SEQUENTIALLY to CIRCUMFLEX and PDA  . IR FLUORO GUIDED NEEDLE PLC ASPIRATION/INJECTION LOC  07/12/2016  . KNEE ARTHROSCOPY Bilateral 2000s  . LEFT HEART CATH AND CORONARY ANGIOGRAPHY N/A 05/29/2016   Procedure: Left Heart Cath and Coronary Angiography;  Surgeon: Lennette Bihari, MD;  Location: [facility]  INVASIVE CV LAB;  Service: Cardiovascular;  Laterality: N/A;  . LUMBAR DISC SURGERY  2006; 07/2005  . LUMBAR WOUND DEBRIDEMENT N/A 06/26/2016   Procedure: Incision and Drainage of LUMBAR WOUND;  Surgeon: Donalee Citrin, MD;  Location: [facility] OR;  Service: Neurosurgery;  Laterality: N/A;  . POSTERIOR LUMBAR FUSION  04/2016  . TEE WITHOUT CARDIOVERSION N/A 07/04/2016   Procedure: TRANSESOPHAGEAL ECHOCARDIOGRAM (TEE);  Surgeon: Wendall Stade, MD;  Location: [facility] ENDOSCOPY;  Service: Cardiovascular;  Laterality: N/A;  . TEE WITHOUT CARDIOVERSION N/A  07/30/2016   Procedure: TRANSESOPHAGEAL ECHOCARDIOGRAM (TEE);  Surgeon: Wendall Stade, MD;  Location: [facility] ENDOSCOPY;  Service: Cardiovascular;  Laterality: N/A;  . TONSILLECTOMY    . TUBAL LIGATION       Medications: Current Meds  Medication Sig  . acetaminophen (TYLENOL) 325 MG tablet Take 650 mg by mouth every 4 (four) hours as needed for mild pain or fever.  Marland Kitchen amiodarone (PACERONE) 200 MG tablet Take 1 tablet (200 mg total) by mouth daily.  Marland Kitchen apixaban (ELIQUIS) 5 MG TABS tablet Take 1 tablet (5 mg total) by mouth 2 (two) times daily.  Marland Kitchen aspirin EC 81 MG EC tablet Take 1 tablet (81 mg total) by mouth daily.  Marland Kitchen atorvastatin (LIPITOR) 80 MG tablet Take 1 tablet (80 mg total) by mouth daily at 6 PM.  . Biotin 10 MG CAPS Take 10 mg by mouth daily.  . bisacodyl (DULCOLAX) 10 MG suppository Place 10 mg rectally as needed for moderate constipation.  . butalbital-acetaminophen-caffeine (FIORICET, ESGIC) 50-325-40 MG tablet Take 1 tablet by mouth 2 (two) times daily as needed for migraine.  . Cholecalciferol (VITAMIN D) 2000 units CAPS Take 2,000 Units by mouth daily after breakfast.  . cyclobenzaprine (FLEXERIL) 10 MG tablet Take 1 tablet (10 mg total) by mouth 3 (three) times daily as needed for muscle spasms.  . cycloSPORINE (RESTASIS) 0.05 % ophthalmic emulsion Place 1 drop into both eyes 2 (two) times daily.  Marland Kitchen diltiazem (CARDIZEM) 60 MG tablet Take 1 tablet (60 mg total) by mouth every 6 (six) hours.  . ferrous sulfate 325 (65 FE) MG tablet Take 1 tablet (325 mg total) by mouth 2 (two) times daily with a meal.  . furosemide (LASIX) 40 MG tablet Take 40 mg by mouth daily.  Marland Kitchen levothyroxine (SYNTHROID, LEVOTHROID) 112 MCG tablet Take 112 mcg by mouth daily before breakfast.  . magnesium chloride (SLOW-MAG) 64 MG TBEC SR tablet Take 2 tablets (128 mg total) by mouth daily.  . pantoprazole (PROTONIX) 40 MG tablet Take 1 tablet (40 mg total) by mouth at bedtime.  . potassium chloride SA  (K-DUR,KLOR-CON) 20 MEQ tablet Take 2 tablets (40 mEq total) by mouth daily.  . traMADol (ULTRAM) 50 MG tablet Take 50 mg by mouth every 6 (six) hours as needed for moderate pain or severe pain.     Allergies: Allergies  Allergen Reactions  . Ace Inhibitors Swelling    ACE stopped after pt seen in ED with facial swelling- allergy testing pending  . Ambien [Zolpidem] Other (See Comments)    confusion  . Morphine And Related   . Codeine Nausea And Vomiting    Social History: The patient  reports that she has never smoked. She has never used smokeless tobacco. She reports that she does not drink alcohol or use drugs.   Family History: The patient's family history includes CAD in her sister and son.   Review of Systems: Please see the history of present illness.  Otherwise, the review of systems is positive for none.   All other systems are reviewed and negative.   Physical Exam: VS:  BP 130/64   Pulse (!) 52   Ht 5\' 3"  (1.6 m)   Wt 165 lb 12.8 oz (75.2 kg)   LMP  (LMP Unknown)   BMI 29.37 kg/m  .  BMI Body mass index is 29.37 kg/m.  Wt Readings from Last 3 Encounters:  08/27/16 165 lb 12.8 oz (75.2 kg)  08/01/16 173 lb 15.1 oz (78.9 kg)  06/21/16 173 lb (78.5 kg)    Affect appropriate Chronically ill pale female  HEENT: normal Neck supple with no adenopathy JVP normal no bruits no thyromegaly Lungs poor air movement basilar crackles and exp wheezing  good diaphragmatic motion Heart:  S1/S2 no murmur, no rub, gallop or click PMI normal Abdomen: benighn, BS positve, no tenderness, no AAA no bruit.  No HSM or HJR Distal pulses intact with no bruits Plus one  Edema post venectomy RLE  Neuro non-focal Skin warm and dry LE weakness  Recent spine surgery with lumbar scar     LABORATORY DATA:  EKG:  08/14/16  NSR prior inferior infarct, anteroseptal infarct.  Lab Results  Component Value Date   WBC 7.5 08/14/2016   HGB 10.4 (L) 08/14/2016   HCT 30.9 (L)  08/14/2016   PLT 176 08/14/2016   GLUCOSE 90 08/14/2016   ALT 3 08/14/2016   AST 12 08/14/2016   NA 133 (L) 08/14/2016   K 4.4 08/14/2016   CL 92 (L) 08/14/2016   CREATININE 0.93 08/14/2016   BUN 8 08/14/2016   CO2 25 08/14/2016   TSH 12.590 (H) 08/14/2016   INR 1.20 07/19/2016   HGBA1C 5.9 (H) 05/30/2016     BNP (last 3 results)  Recent Labs  07/28/16 0519  BNP 371.5*    ProBNP (last 3 results)  Recent Labs  08/14/16 1524  PROBNP 1,595*     Other Studies Reviewed Today:  Echo Study Conclusions 07/2016  - Left ventricle: Systolic function was mildly to moderately reduced. The estimated ejection fraction was in the range of 40% to 45%. Diffuse hypokinesis. No evidence of thrombus. - Ventricular septum: Septal motion showed abnormal function and dyssynergy. - Mitral valve: There was mild regurgitation. - Left atrium: LAA clip intact with thrombus distal and no connection to LA by 2D or color flow. The atrium was dilated. No evidence of thrombus in the atrial cavity or appendage. - Right atrium: The atrium was mildly dilated. - Atrial septum: There was increased thickness of the septum, consistent with lipomatous hypertrophy. No defect or patent foramen ovale was identified. - Tricuspid valve: There was moderate regurgitation. - Impressions: DCC: Propofol Anesthesia Dr Leanord Hawking Single 120 J biphasic shock Converted from atrial flutter rate 122 to NSR rate 78 with PAC;s No immediate neurologic sequelae On Rx Eliquis On iv amiodarone with additional 150 mg bolus given due to frequent PAC;s  Impressions:  - DCC: Propofol Anesthesia Dr Leanord Hawking Single 120 J biphasic shock Converted from atrial flutter rate 122 to NSR rate 78 with PAC;s No immediate neurologic sequelae On Rx Eliquis On iv amiodarone with additional 150 mg bolus given due to frequent PAC;s Successful cardioversion. No cardiac source of emboli was  indentified.  Assessment/Plan: 1. PAF - Maintaining NSR on lower dose amiodarone  Remains on Eliquis.   2. Chronic systolic HF - on lasix check CXR BMET Mag and BNP today may need daily lasix  3. CAD -  recent CABG - no active chest pain.   4. Recent sepsis/epidural abscess - antibiotics done July 4th f/u Dr Wynetta Emery would d/c [facility] line avoid further surgery In near future   6. Hypothyroidism - f/u labs on replacement amiodarone dose reduced    Charlton Haws, MD

## 2016-08-25 ENCOUNTER — Inpatient Hospital Stay
Admission: RE | Admit: 2016-08-25 | Discharge: 2016-08-25 | Disposition: A | Discharge status: Home / Self Care | Payer: Medicare HMO | Source: Ambulatory Visit | Attending: Neurosurgery | Admitting: Neurosurgery

## 2016-08-27 ENCOUNTER — Encounter: Payer: Self-pay | Admitting: Cardiovascular Disease

## 2016-08-27 ENCOUNTER — Ambulatory Visit
Admission: RE | Admit: 2016-08-27 | Discharge: 2016-08-27 | Disposition: A | Discharge status: Home / Self Care | Payer: Medicare HMO | Source: Ambulatory Visit | Attending: Cardiovascular Disease | Admitting: Cardiovascular Disease

## 2016-08-27 ENCOUNTER — Ambulatory Visit (INDEPENDENT_AMBULATORY_CARE_PROVIDER_SITE_OTHER): Payer: Medicare HMO | Admitting: Cardiovascular Disease

## 2016-08-27 ENCOUNTER — Encounter (INDEPENDENT_AMBULATORY_CARE_PROVIDER_SITE_OTHER): Payer: Self-pay

## 2016-08-27 VITALS — BP 130/64 | HR 52 | Ht 63.0 in | Wt 165.8 lb

## 2016-08-27 DIAGNOSIS — I5033 Acute on chronic diastolic (congestive) heart failure: Secondary | ICD-10-CM | POA: Diagnosis not present

## 2016-08-27 NOTE — Patient Instructions (Addendum)
Medication Instructions:  Your physician recommends that you continue on your current medications as directed. Please refer to the Current Medication list given to you today.  Labwork: Your physician recommends that you have lab work today- BMET, Mg, BNP, TSH  Testing/Procedures: A chest x-ray takes a picture of the organs and structures inside the chest, including the heart, lungs, and blood vessels. This test can show several things, including, whether the heart is enlarges; whether fluid is building up in the lungs; and whether pacemaker / defibrillator leads are still in place. Go to [facility] at [address] [place]  Follow-Up: Your physician wants you to follow-up in: 3 months with Dr. Eden Emms.    If you need a refill on your cardiac medications before your next appointment, please call your pharmacy.

## 2016-08-28 ENCOUNTER — Telehealth: Payer: Self-pay | Admitting: Cardiovascular Disease

## 2016-08-28 LAB — BASIC METABOLIC PANEL
BUN/Creatinine Ratio: 10 — ABNORMAL LOW (ref 12–28)
BUN: 9 mg/dL (ref 8–27)
CO2: 28 mmol/L (ref 20–29)
Calcium: 8.6 mg/dL — ABNORMAL LOW (ref 8.7–10.3)
Chloride: 88 mmol/L — ABNORMAL LOW (ref 96–106)
Creatinine, Ser: 0.88 mg/dL (ref 0.57–1.00)
GFR calc Af Amer: 75 mL/min/{1.73_m2} (ref >59)
GFR calc non Af Amer: 65 mL/min/{1.73_m2} (ref >59)
Glucose: 94 mg/dL (ref 65–99)
Potassium: 4.1 mmol/L (ref 3.5–5.2)
Sodium: 130 mmol/L — ABNORMAL LOW (ref 134–144)

## 2016-08-28 LAB — PRO B NATRIURETIC PEPTIDE: NT-Pro BNP: 2762 pg/mL — ABNORMAL HIGH (ref 0–301)

## 2016-08-28 LAB — MAGNESIUM: Magnesium: 1.7 mg/dL (ref 1.6–2.3)

## 2016-08-28 LAB — TSH: TSH: 13.45 u[IU]/mL — ABNORMAL HIGH (ref 0.450–4.500)

## 2016-08-28 NOTE — Telephone Encounter (Signed)
New message    Pt is returning call to [facility] from yesterday about results.

## 2016-08-28 NOTE — Telephone Encounter (Signed)
Patient's son (DPR) called back. Informed him of test results. Per Dr. Eden Emms, TSH was 13, talk to PCP about raising synthroid a bit. Patient's son verbalized understanding and requested lab work be sent to patient's current rehab facility. Will fax to [place], [phone].

## 2016-08-30 ENCOUNTER — Other Ambulatory Visit: Payer: Self-pay | Admitting: Neurosurgery

## 2016-08-31 ENCOUNTER — Ambulatory Visit (INDEPENDENT_AMBULATORY_CARE_PROVIDER_SITE_OTHER): Payer: Medicare HMO | Admitting: Internal Medicine

## 2016-08-31 ENCOUNTER — Encounter: Payer: Self-pay | Admitting: Internal Medicine

## 2016-08-31 VITALS — BP 145/79 | HR 72 | Temp 98.0°F | Ht 63.0 in | Wt 165.0 lb

## 2016-08-31 DIAGNOSIS — M869 Osteomyelitis, unspecified: Secondary | ICD-10-CM | POA: Diagnosis not present

## 2016-08-31 DIAGNOSIS — K1379 Other lesions of oral mucosa: Secondary | ICD-10-CM

## 2016-08-31 LAB — CBC WITH DIFFERENTIAL/PLATELET
Basophils Absolute: 0 cells/uL (ref 0–200)
Basophils Relative: 0 %
Eosinophils Absolute: 264 cells/uL (ref 15–500)
Eosinophils Relative: 3 %
HCT: 28.4 % — ABNORMAL LOW (ref 35.0–45.0)
Hemoglobin: 9.3 g/dL — ABNORMAL LOW (ref 11.7–15.5)
Lymphocytes Relative: 10 %
Lymphs Abs: 880 cells/uL (ref 850–3900)
MCH: 29.2 pg (ref 27.0–33.0)
MCHC: 32.7 g/dL (ref 32.0–36.0)
MCV: 89.3 fL (ref 80.0–100.0)
MPV: 8.2 fL (ref 7.5–12.5)
Monocytes Absolute: 528 cells/uL (ref 200–950)
Monocytes Relative: 6 %
Neutro Abs: 7128 cells/uL (ref 1500–7800)
Neutrophils Relative %: 81 %
Platelets: 151 10*3/uL (ref 140–400)
RBC: 3.18 MIL/uL — ABNORMAL LOW (ref 3.80–5.10)
RDW: 18.3 % — ABNORMAL HIGH (ref 11.0–15.0)
WBC: 8.8 10*3/uL (ref 3.8–10.8)
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
## 2016-09-01 LAB — SEDIMENTATION RATE: Sed Rate: 41 mm/hr — ABNORMAL HIGH (ref 0–30)

## 2016-09-03 LAB — C-REACTIVE PROTEIN: CRP: 115.6 mg/L — ABNORMAL HIGH (ref <8.0)

## 2016-09-04 ENCOUNTER — Telehealth: Payer: Self-pay

## 2016-09-04 NOTE — Telephone Encounter (Signed)
Left message for Pt to call the office back. Pt came and had swab done for oral herpes testing and specimen couldn't be resulted. Needs to come back into the office for new sample to be collected (nurse visit).

## 2016-09-05 ENCOUNTER — Encounter (HOSPITAL_COMMUNITY): Payer: Self-pay

## 2016-09-05 ENCOUNTER — Encounter (HOSPITAL_COMMUNITY)
Admission: RE | Admit: 2016-09-05 | Discharge: 2016-09-05 | Disposition: A | Discharge status: Home / Self Care | Payer: Medicare HMO | Source: Ambulatory Visit | Attending: Neurosurgery | Admitting: Neurosurgery

## 2016-09-05 HISTORY — DX: Unspecified atrial fibrillation: I48.91

## 2016-09-05 HISTORY — DX: Major depressive disorder, single episode, unspecified: F32.9

## 2016-09-05 HISTORY — DX: Depression, unspecified: F32.A

## 2016-09-05 HISTORY — DX: Cardiac arrhythmia, unspecified: I49.9

## 2016-09-05 HISTORY — DX: Anxiety disorder, unspecified: F41.9

## 2016-09-05 LAB — CBC
HCT: 31.7 % — ABNORMAL LOW (ref 36.0–46.0)
Hemoglobin: 10.5 g/dL — ABNORMAL LOW (ref 12.0–15.0)
MCH: 29.5 pg (ref 26.0–34.0)
MCHC: 33.1 g/dL (ref 30.0–36.0)
MCV: 89 fL (ref 78.0–100.0)
Platelets: 169 10*3/uL (ref 150–400)
RBC: 3.56 MIL/uL — ABNORMAL LOW (ref 3.87–5.11)
RDW: 18.3 % — ABNORMAL HIGH (ref 11.5–15.5)
WBC: 8.1 10*3/uL (ref 4.0–10.5)

## 2016-09-05 LAB — SURGICAL PCR SCREEN
MRSA, PCR: NEGATIVE
Staphylococcus aureus: NEGATIVE

## 2016-09-05 LAB — TYPE AND SCREEN
ABO/RH(D): O POS
Antibody Screen: NEGATIVE

## 2016-09-05 LAB — BASIC METABOLIC PANEL
Anion gap: 8 (ref 5–15)
BUN: 6 mg/dL (ref 6–20)
CO2: 26 mmol/L (ref 22–32)
Calcium: 9.1 mg/dL (ref 8.9–10.3)
Chloride: 93 mmol/L — ABNORMAL LOW (ref 101–111)
Creatinine, Ser: 0.78 mg/dL (ref 0.44–1.00)
GFR calc Af Amer: >60 mL/min (ref >60)
GFR calc non Af Amer: >60 mL/min (ref >60)
Glucose, Bld: 100 mg/dL — ABNORMAL HIGH (ref 65–99)
Potassium: 3.7 mmol/L (ref 3.5–5.1)
Sodium: 127 mmol/L — ABNORMAL LOW (ref 135–145)

## 2016-09-05 LAB — PROTIME-INR
INR: 1.03
Prothrombin Time: 13.5 seconds (ref 11.4–15.2)

## 2016-09-05 NOTE — Progress Notes (Signed)
According to patient's son, eliquis and aspirin were stopped last Thursday 08/30/16.  Last dose of lovenox was yesterday  09/05/16.
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
## 2016-09-05 NOTE — Pre-Procedure Instructions (Signed)
Erin [name]  09/05/2016      [id number] - [place], [place] - [address] [place] [address] Phone: [phone] Fax: [phone]    Your procedure is scheduled on   Thursday  09/06/16  Report to [facility] Admitting at 945 A.M.  Call this number if you have problems the morning of surgery:  [phone]   Remember:  Do not eat food or drink liquids after midnight.  Take these medicines the morning of surgery with A SIP OF WATER   AMIODARONE (PACERONE), EYE DROPS, DILTIAZEM (CARDIZEM), LEVOTHYROXINE, PANTOPRAZOLE (PROTONIX), TRAMADOL IF NEEDED  7 days prior to surgery STOP taking any Aspirin, Aleve, Naproxen, Ibuprofen, Motrin, Advil, Goody's, BC's, all herbal medications, fish oil, and all vitamins     (NO ELIQUIS OR ASPIRIN AS INSTRUCTED BY YOUR CARDIOLOGIST)   Do not wear jewelry, make-up or nail polish.  Do not wear lotions, powders, or perfumes, or deoderant.  Do not shave 48 hours prior to surgery.  Men may shave face and neck.  Do not bring valuables to the hospital.  [facility] is not responsible for any belongings or valuables.  Contacts, dentures or bridgework may not be worn into surgery.  Leave your suitcase in the car.  After surgery it may be brought to your room.  For patients admitted to the hospital, discharge time will be determined by your treatment team.  Patients discharged the day of surgery will not be allowed to drive home.   Name and phone number of your driver:    Special instructions:  [place] - Preparing for Surgery  Before surgery, you can play an important role.  Because skin is not sterile, your skin needs to be as free of germs as possible.  You can reduce the number of germs on you skin by washing with CHG (chlorahexidine gluconate) soap before surgery.  CHG is an antiseptic cleaner which kills germs and bonds with the skin to continue killing germs even after washing.  Please  DO NOT use if you have an allergy to CHG or antibacterial soaps.  If your skin becomes reddened/irritated stop using the CHG and inform your nurse when you arrive at Short Stay.  Do not shave (including legs and underarms) for at least 48 hours prior to the first CHG shower.  You may shave your face.  Please follow these instructions carefully:   1.  Shower with CHG Soap the night before surgery and the                                morning of Surgery.  2.  If you choose to wash your hair, wash your hair first as usual with your       normal shampoo.  3.  After you shampoo, rinse your hair and body thoroughly to remove the                      Shampoo.  4.  Use CHG as you would any other liquid soap.  You can apply chg directly       to the skin and wash gently with scrungie or a clean washcloth.  5.  Apply the CHG Soap to your body ONLY FROM THE NECK DOWN.        Do not use on open wounds or open sores.  Avoid  contact with your eyes,       ears, mouth and genitals (private parts).  Wash genitals (private parts)       with your normal soap.  6.  Wash thoroughly, paying special attention to the area where your surgery        will be performed.  7.  Thoroughly rinse your body with warm water from the neck down.  8.  DO NOT shower/wash with your normal soap after using and rinsing off       the CHG Soap.  9.  Pat yourself dry with a clean towel.            10.  Wear clean pajamas.            11.  Place clean sheets on your bed the night of your first shower and do not        sleep with pets.  Day of Surgery  Do not apply any lotions/deoderants the morning of surgery.  Please wear clean clothes to the hospital/surgery center.    Please read over the following fact sheets that you were given. Pain Booklet, MRSA Information and Surgical Site Infection Prevention

## 2016-09-05 NOTE — Addendum Note (Signed)
Addendum  created 09/05/16 1950 by Shireen Quan, CRNA   Anesthesia Intra Flowsheets edited

## 2016-09-06 ENCOUNTER — Encounter (HOSPITAL_COMMUNITY)
Admission: RE | Disposition: A | Discharge status: Skilled Nursing Facility | Payer: Self-pay | Source: Ambulatory Visit | Attending: Neurosurgery

## 2016-09-06 ENCOUNTER — Inpatient Hospital Stay (HOSPITAL_COMMUNITY): Payer: Medicare HMO

## 2016-09-06 ENCOUNTER — Inpatient Hospital Stay (HOSPITAL_COMMUNITY): Payer: Medicare HMO | Admitting: Emergency Medicine

## 2016-09-06 ENCOUNTER — Inpatient Hospital Stay (HOSPITAL_COMMUNITY): Payer: Medicare HMO | Admitting: Vascular Surgery

## 2016-09-06 ENCOUNTER — Inpatient Hospital Stay (HOSPITAL_COMMUNITY)
Admission: RE | Admit: 2016-09-06 | Discharge: 2016-09-10 | DRG: 457 | Disposition: A | Discharge status: Skilled Nursing Facility | Payer: Medicare HMO | Source: Ambulatory Visit | Attending: Neurosurgery | Admitting: Neurosurgery

## 2016-09-06 ENCOUNTER — Encounter (HOSPITAL_COMMUNITY): Payer: Self-pay | Admitting: *Deleted

## 2016-09-06 DIAGNOSIS — K5900 Constipation, unspecified: Secondary | ICD-10-CM | POA: Diagnosis present

## 2016-09-06 DIAGNOSIS — Z79890 Hormone replacement therapy (postmenopausal): Secondary | ICD-10-CM

## 2016-09-06 DIAGNOSIS — I2510 Atherosclerotic heart disease of native coronary artery without angina pectoris: Secondary | ICD-10-CM | POA: Diagnosis present

## 2016-09-06 DIAGNOSIS — E785 Hyperlipidemia, unspecified: Secondary | ICD-10-CM | POA: Diagnosis present

## 2016-09-06 DIAGNOSIS — M532X6 Spinal instabilities, lumbar region: Secondary | ICD-10-CM | POA: Diagnosis present

## 2016-09-06 DIAGNOSIS — Z79899 Other long term (current) drug therapy: Secondary | ICD-10-CM | POA: Diagnosis not present

## 2016-09-06 DIAGNOSIS — I480 Paroxysmal atrial fibrillation: Secondary | ICD-10-CM | POA: Diagnosis present

## 2016-09-06 DIAGNOSIS — Y792 Prosthetic and other implants, materials and accessory orthopedic devices associated with adverse incidents: Secondary | ICD-10-CM | POA: Diagnosis present

## 2016-09-06 DIAGNOSIS — G43909 Migraine, unspecified, not intractable, without status migrainosus: Secondary | ICD-10-CM | POA: Diagnosis present

## 2016-09-06 DIAGNOSIS — F329 Major depressive disorder, single episode, unspecified: Secondary | ICD-10-CM | POA: Diagnosis present

## 2016-09-06 DIAGNOSIS — Z7982 Long term (current) use of aspirin: Secondary | ICD-10-CM | POA: Diagnosis not present

## 2016-09-06 DIAGNOSIS — M1990 Unspecified osteoarthritis, unspecified site: Secondary | ICD-10-CM | POA: Diagnosis not present

## 2016-09-06 DIAGNOSIS — G4730 Sleep apnea, unspecified: Secondary | ICD-10-CM | POA: Diagnosis present

## 2016-09-06 DIAGNOSIS — I5022 Chronic systolic (congestive) heart failure: Secondary | ICD-10-CM | POA: Diagnosis present

## 2016-09-06 DIAGNOSIS — Z951 Presence of aortocoronary bypass graft: Secondary | ICD-10-CM | POA: Diagnosis not present

## 2016-09-06 DIAGNOSIS — E871 Hypo-osmolality and hyponatremia: Secondary | ICD-10-CM | POA: Diagnosis present

## 2016-09-06 DIAGNOSIS — Z9841 Cataract extraction status, right eye: Secondary | ICD-10-CM

## 2016-09-06 DIAGNOSIS — M4620 Osteomyelitis of vertebra, site unspecified: Secondary | ICD-10-CM | POA: Diagnosis not present

## 2016-09-06 DIAGNOSIS — E039 Hypothyroidism, unspecified: Secondary | ICD-10-CM | POA: Diagnosis present

## 2016-09-06 DIAGNOSIS — T84226A Displacement of internal fixation device of vertebrae, initial encounter: Secondary | ICD-10-CM | POA: Diagnosis present

## 2016-09-06 DIAGNOSIS — M810 Age-related osteoporosis without current pathological fracture: Secondary | ICD-10-CM | POA: Diagnosis present

## 2016-09-06 DIAGNOSIS — Z8701 Personal history of pneumonia (recurrent): Secondary | ICD-10-CM

## 2016-09-06 DIAGNOSIS — D649 Anemia, unspecified: Secondary | ICD-10-CM | POA: Diagnosis present

## 2016-09-06 DIAGNOSIS — D500 Iron deficiency anemia secondary to blood loss (chronic): Secondary | ICD-10-CM | POA: Diagnosis present

## 2016-09-06 DIAGNOSIS — Z888 Allergy status to other drugs, medicaments and biological substances status: Secondary | ICD-10-CM

## 2016-09-06 DIAGNOSIS — Z419 Encounter for procedure for purposes other than remedying health state, unspecified: Secondary | ICD-10-CM

## 2016-09-06 DIAGNOSIS — M4626 Osteomyelitis of vertebra, lumbar region: Secondary | ICD-10-CM | POA: Diagnosis present

## 2016-09-06 DIAGNOSIS — E7800 Pure hypercholesterolemia, unspecified: Secondary | ICD-10-CM | POA: Diagnosis present

## 2016-09-06 DIAGNOSIS — Z7901 Long term (current) use of anticoagulants: Secondary | ICD-10-CM

## 2016-09-06 DIAGNOSIS — Z8661 Personal history of infections of the central nervous system: Secondary | ICD-10-CM

## 2016-09-06 DIAGNOSIS — Z9889 Other specified postprocedural states: Secondary | ICD-10-CM | POA: Diagnosis not present

## 2016-09-06 DIAGNOSIS — I110 Hypertensive heart disease with heart failure: Secondary | ICD-10-CM | POA: Diagnosis present

## 2016-09-06 DIAGNOSIS — M40204 Unspecified kyphosis, thoracic region: Secondary | ICD-10-CM | POA: Diagnosis present

## 2016-09-06 DIAGNOSIS — F419 Anxiety disorder, unspecified: Secondary | ICD-10-CM | POA: Diagnosis present

## 2016-09-06 DIAGNOSIS — M4640 Discitis, unspecified, site unspecified: Secondary | ICD-10-CM | POA: Diagnosis not present

## 2016-09-06 DIAGNOSIS — Z981 Arthrodesis status: Secondary | ICD-10-CM

## 2016-09-06 DIAGNOSIS — Z9842 Cataract extraction status, left eye: Secondary | ICD-10-CM

## 2016-09-06 DIAGNOSIS — Z8711 Personal history of peptic ulcer disease: Secondary | ICD-10-CM

## 2016-09-06 DIAGNOSIS — K219 Gastro-esophageal reflux disease without esophagitis: Secondary | ICD-10-CM | POA: Diagnosis present

## 2016-09-06 DIAGNOSIS — Z885 Allergy status to narcotic agent status: Secondary | ICD-10-CM

## 2016-09-06 DIAGNOSIS — S32029G Unspecified fracture of second lumbar vertebra, subsequent encounter for fracture with delayed healing: Secondary | ICD-10-CM

## 2016-09-06 DIAGNOSIS — Z8249 Family history of ischemic heart disease and other diseases of the circulatory system: Secondary | ICD-10-CM

## 2016-09-06 DIAGNOSIS — M4646 Discitis, unspecified, lumbar region: Secondary | ICD-10-CM | POA: Diagnosis not present

## 2016-09-06 LAB — POCT I-STAT 4, (NA,K, GLUC, HGB,HCT)
Glucose, Bld: 81 mg/dL (ref 65–99)
HCT: 33 % — ABNORMAL LOW (ref 36.0–46.0)
Hemoglobin: 11.2 g/dL — ABNORMAL LOW (ref 12.0–15.0)
Potassium: 3.5 mmol/L (ref 3.5–5.1)
Sodium: 130 mmol/L — ABNORMAL LOW (ref 135–145)

## 2016-09-06 SURGERY — POSTERIOR LUMBAR FUSION 3 WITH HARDWARE REMOVAL
Anesthesia: General | Site: Back

## 2016-09-06 MED ORDER — PANTOPRAZOLE SODIUM 40 MG PO TBEC
40.0000 mg | DELAYED_RELEASE_TABLET | Freq: Every day | ORAL | Status: DC
  Administered 2016-09-06 – 2016-09-09 (×4): 40 mg via ORAL
  Filled 2016-09-06 (×4): qty 1

## 2016-09-06 MED ORDER — BUPIVACAINE LIPOSOME 1.3 % IJ SUSP
20.0000 mL | INTRAMUSCULAR | Status: AC
  Administered 2016-09-06: 20 mL
  Filled 2016-09-06: qty 20

## 2016-09-06 MED ORDER — ACETAMINOPHEN 325 MG PO TABS
650.0000 mg | ORAL_TABLET | ORAL | Status: DC | PRN
  Administered 2016-09-07 – 2016-09-10 (×4): 650 mg via ORAL
  Filled 2016-09-06 (×4): qty 2

## 2016-09-06 MED ORDER — SENNOSIDES-DOCUSATE SODIUM 8.6-50 MG PO TABS: 1.0000 | ORAL_TABLET | Freq: Every evening | ORAL | Status: DC | PRN

## 2016-09-06 MED ORDER — LACTATED RINGERS IV SOLN: INTRAVENOUS | Status: DC

## 2016-09-06 MED ORDER — LEVOTHYROXINE SODIUM 75 MCG PO TABS
150.0000 ug | ORAL_TABLET | Freq: Every day | ORAL | Status: DC
  Administered 2016-09-07 – 2016-09-10 (×4): 150 ug via ORAL
  Filled 2016-09-06 (×4): qty 2

## 2016-09-06 MED ORDER — FENTANYL CITRATE (PF) 100 MCG/2ML IJ SOLN
INTRAMUSCULAR | Status: DC | PRN
  Administered 2016-09-06: 150 ug via INTRAVENOUS
  Administered 2016-09-06 (×2): 50 ug via INTRAVENOUS

## 2016-09-06 MED ORDER — LIDOCAINE-EPINEPHRINE 1 %-1:100000 IJ SOLN
INTRAMUSCULAR | Status: AC
  Filled 2016-09-06: qty 1

## 2016-09-06 MED ORDER — TIZANIDINE HCL 2 MG PO TABS: 4.0000 mg | ORAL_TABLET | Freq: Three times a day (TID) | ORAL | Status: DC | PRN

## 2016-09-06 MED ORDER — PHENYLEPH-SHARK LIV OIL-MO-PET 0.25-3-14-71.9 % RE OINT: 1.0000 "application " | TOPICAL_OINTMENT | RECTAL | Status: DC | PRN

## 2016-09-06 MED ORDER — BISACODYL 10 MG RE SUPP: 10.0000 mg | RECTAL | Status: DC | PRN

## 2016-09-06 MED ORDER — SODIUM CHLORIDE 0.9 % IR SOLN
Status: DC | PRN
  Administered 2016-09-06 (×2)

## 2016-09-06 MED ORDER — VITAMIN D 1000 UNITS PO TABS
2000.0000 [IU] | ORAL_TABLET | Freq: Every day | ORAL | Status: DC
  Administered 2016-09-07 – 2016-09-10 (×4): 2000 [IU] via ORAL
  Filled 2016-09-06 (×4): qty 2

## 2016-09-06 MED ORDER — MAGNESIUM CITRATE PO SOLN: 1.0000 | ORAL | Status: DC | PRN

## 2016-09-06 MED ORDER — ATORVASTATIN CALCIUM 80 MG PO TABS
80.0000 mg | ORAL_TABLET | Freq: Every day | ORAL | Status: DC
  Administered 2016-09-07 – 2016-09-10 (×4): 80 mg via ORAL
  Filled 2016-09-06 (×4): qty 1

## 2016-09-06 MED ORDER — ONDANSETRON HCL 4 MG/2ML IJ SOLN: 4.0000 mg | Freq: Four times a day (QID) | INTRAMUSCULAR | Status: DC | PRN

## 2016-09-06 MED ORDER — ONDANSETRON HCL 4 MG PO TABS
8.0000 mg | ORAL_TABLET | Freq: Three times a day (TID) | ORAL | Status: DC
  Administered 2016-09-06 – 2016-09-10 (×4): 8 mg via ORAL
  Filled 2016-09-06 (×7): qty 2

## 2016-09-06 MED ORDER — FERROUS SULFATE 325 (65 FE) MG PO TABS
325.0000 mg | ORAL_TABLET | Freq: Two times a day (BID) | ORAL | Status: DC
  Administered 2016-09-07 – 2016-09-10 (×8): 325 mg via ORAL
  Filled 2016-09-06 (×8): qty 1

## 2016-09-06 MED ORDER — FUROSEMIDE 40 MG PO TABS
40.0000 mg | ORAL_TABLET | Freq: Every day | ORAL | Status: DC
  Administered 2016-09-07 – 2016-09-10 (×4): 40 mg via ORAL
  Filled 2016-09-06 (×4): qty 1

## 2016-09-06 MED ORDER — SODIUM CHLORIDE 0.9% FLUSH
3.0000 mL | Freq: Two times a day (BID) | INTRAVENOUS | Status: DC
  Administered 2016-09-08 – 2016-09-09 (×3): 3 mL via INTRAVENOUS

## 2016-09-06 MED ORDER — DEXAMETHASONE SODIUM PHOSPHATE 10 MG/ML IJ SOLN
INTRAMUSCULAR | Status: DC | PRN
  Administered 2016-09-06: 10 mg via INTRAVENOUS

## 2016-09-06 MED ORDER — PHENYLEPHRINE HCL 10 MG/ML IJ SOLN
INTRAMUSCULAR | Status: DC | PRN
  Administered 2016-09-06: 80 ug via INTRAVENOUS

## 2016-09-06 MED ORDER — ASPIRIN EC 81 MG PO TBEC
81.0000 mg | DELAYED_RELEASE_TABLET | Freq: Every day | ORAL | Status: DC
  Administered 2016-09-07 – 2016-09-10 (×4): 81 mg via ORAL
  Filled 2016-09-06 (×4): qty 1

## 2016-09-06 MED ORDER — CYCLOBENZAPRINE HCL 10 MG PO TABS: 10.0000 mg | ORAL_TABLET | Freq: Three times a day (TID) | ORAL | Status: DC | PRN

## 2016-09-06 MED ORDER — HYDROMORPHONE HCL 1 MG/ML IJ SOLN: 0.5000 mg | INTRAMUSCULAR | Status: DC | PRN

## 2016-09-06 MED ORDER — ACETAMINOPHEN 650 MG RE SUPP: 650.0000 mg | RECTAL | Status: DC | PRN

## 2016-09-06 MED ORDER — PROPOFOL 10 MG/ML IV BOLUS
INTRAVENOUS | Status: AC
  Filled 2016-09-06: qty 20

## 2016-09-06 MED ORDER — TRAMADOL HCL 50 MG PO TABS
100.0000 mg | ORAL_TABLET | ORAL | Status: DC | PRN
  Administered 2016-09-07 – 2016-09-10 (×16): 100 mg via ORAL
  Filled 2016-09-06 (×17): qty 2

## 2016-09-06 MED ORDER — VALACYCLOVIR HCL 500 MG PO TABS
1000.0000 mg | ORAL_TABLET | Freq: Two times a day (BID) | ORAL | Status: DC
  Administered 2016-09-06 – 2016-09-10 (×8): 1000 mg via ORAL
  Filled 2016-09-06 (×9): qty 2

## 2016-09-06 MED ORDER — BACLOFEN 10 MG PO TABS: 5.0000 mg | ORAL_TABLET | Freq: Three times a day (TID) | ORAL | Status: DC | PRN

## 2016-09-06 MED ORDER — DILTIAZEM HCL 60 MG PO TABS
60.0000 mg | ORAL_TABLET | Freq: Four times a day (QID) | ORAL | Status: DC
  Administered 2016-09-06 – 2016-09-10 (×16): 60 mg via ORAL
  Filled 2016-09-06 (×16): qty 1

## 2016-09-06 MED ORDER — VANCOMYCIN HCL 1000 MG IV SOLR
INTRAVENOUS | Status: AC
  Filled 2016-09-06: qty 1000

## 2016-09-06 MED ORDER — PROMETHAZINE HCL 25 MG/ML IJ SOLN
6.2500 mg | INTRAMUSCULAR | Status: DC | PRN
  Administered 2016-09-06: 6.25 mg via INTRAVENOUS

## 2016-09-06 MED ORDER — ACETAMINOPHEN 325 MG PO TABS: 650.0000 mg | ORAL_TABLET | ORAL | Status: DC | PRN

## 2016-09-06 MED ORDER — BISACODYL 5 MG PO TBEC: 5.0000 mg | DELAYED_RELEASE_TABLET | Freq: Every day | ORAL | Status: DC | PRN

## 2016-09-06 MED ORDER — PHENYLEPHRINE HCL 10 MG/ML IJ SOLN
INTRAVENOUS | Status: DC | PRN
  Administered 2016-09-06: 15 ug/min via INTRAVENOUS

## 2016-09-06 MED ORDER — AMIODARONE HCL 200 MG PO TABS
200.0000 mg | ORAL_TABLET | Freq: Every day | ORAL | Status: DC
  Administered 2016-09-06 – 2016-09-10 (×5): 200 mg via ORAL
  Filled 2016-09-06 (×5): qty 1

## 2016-09-06 MED ORDER — PROMETHAZINE HCL 25 MG/ML IJ SOLN
INTRAMUSCULAR | Status: AC
  Administered 2016-09-06: 6.25 mg via INTRAVENOUS
  Filled 2016-09-06: qty 1

## 2016-09-06 MED ORDER — CEFAZOLIN SODIUM-DEXTROSE 2-3 GM-% IV SOLR
INTRAVENOUS | Status: DC | PRN
  Administered 2016-09-06: 2 g via INTRAVENOUS

## 2016-09-06 MED ORDER — DEXAMETHASONE SODIUM PHOSPHATE 10 MG/ML IJ SOLN
INTRAMUSCULAR | Status: AC
  Filled 2016-09-06: qty 1

## 2016-09-06 MED ORDER — ONDANSETRON HCL 4 MG PO TABS
4.0000 mg | ORAL_TABLET | Freq: Four times a day (QID) | ORAL | Status: DC | PRN
  Filled 2016-09-06: qty 1

## 2016-09-06 MED ORDER — ROCURONIUM BROMIDE 100 MG/10ML IV SOLN
INTRAVENOUS | Status: DC | PRN
  Administered 2016-09-06: 10 mg via INTRAVENOUS
  Administered 2016-09-06: 20 mg via INTRAVENOUS
  Administered 2016-09-06: 50 mg via INTRAVENOUS
  Administered 2016-09-06: 10 mg via INTRAVENOUS

## 2016-09-06 MED ORDER — LIDOCAINE HCL (CARDIAC) 20 MG/ML IV SOLN
INTRAVENOUS | Status: DC | PRN
  Administered 2016-09-06: 100 mg via INTRAVENOUS

## 2016-09-06 MED ORDER — CYCLOSPORINE 0.05 % OP EMUL
1.0000 [drp] | Freq: Two times a day (BID) | OPHTHALMIC | Status: DC
  Administered 2016-09-06 – 2016-09-10 (×8): 1 [drp] via OPHTHALMIC
  Filled 2016-09-06 (×9): qty 1

## 2016-09-06 MED ORDER — PHENOL 1.4 % MT LIQD: 1.0000 | OROMUCOSAL | Status: DC | PRN

## 2016-09-06 MED ORDER — MAGNESIUM HYDROXIDE 400 MG/5ML PO SUSP
30.0000 mL | Freq: Every evening | ORAL | Status: DC | PRN
  Filled 2016-09-06: qty 30

## 2016-09-06 MED ORDER — HYDROMORPHONE HCL 1 MG/ML IJ SOLN: 0.2500 mg | INTRAMUSCULAR | Status: DC | PRN

## 2016-09-06 MED ORDER — AMIODARONE HCL 200 MG PO TABS
200.0000 mg | ORAL_TABLET | Freq: Once | ORAL | Status: AC
  Administered 2016-09-06: 200 mg via ORAL
  Filled 2016-09-06: qty 1

## 2016-09-06 MED ORDER — SODIUM CHLORIDE 0.9 % IV SOLN: 250.0000 mL | INTRAVENOUS | Status: DC

## 2016-09-06 MED ORDER — 0.9 % SODIUM CHLORIDE (POUR BTL) OPTIME
TOPICAL | Status: DC | PRN
  Administered 2016-09-06: 1000 mL

## 2016-09-06 MED ORDER — ALUM & MAG HYDROXIDE-SIMETH 200-200-20 MG/5ML PO SUSP
30.0000 mL | Freq: Four times a day (QID) | ORAL | Status: DC | PRN
  Administered 2016-09-08: 30 mL via ORAL
  Filled 2016-09-06: qty 30

## 2016-09-06 MED ORDER — BUTALBITAL-APAP-CAFFEINE 50-325-40 MG PO TABS: 1.0000 | ORAL_TABLET | Freq: Two times a day (BID) | ORAL | Status: DC | PRN

## 2016-09-06 MED ORDER — THROMBIN 20000 UNITS EX SOLR
CUTANEOUS | Status: AC
  Filled 2016-09-06: qty 20000

## 2016-09-06 MED ORDER — MENTHOL 3 MG MT LOZG: 1.0000 | LOZENGE | OROMUCOSAL | Status: DC | PRN

## 2016-09-06 MED ORDER — SUGAMMADEX SODIUM 200 MG/2ML IV SOLN
INTRAVENOUS | Status: DC | PRN
  Administered 2016-09-06: 150 mg via INTRAVENOUS

## 2016-09-06 MED ORDER — BUPIVACAINE HCL (PF) 0.25 % IJ SOLN
INTRAMUSCULAR | Status: AC
  Filled 2016-09-06: qty 30

## 2016-09-06 MED ORDER — MEPERIDINE HCL 25 MG/ML IJ SOLN: 6.2500 mg | INTRAMUSCULAR | Status: DC | PRN

## 2016-09-06 MED ORDER — PROPOFOL 10 MG/ML IV BOLUS
INTRAVENOUS | Status: DC | PRN
  Administered 2016-09-06: 125 mg via INTRAVENOUS

## 2016-09-06 MED ORDER — CYCLOBENZAPRINE HCL 10 MG PO TABS
10.0000 mg | ORAL_TABLET | Freq: Three times a day (TID) | ORAL | Status: DC | PRN
  Administered 2016-09-07 – 2016-09-10 (×7): 10 mg via ORAL
  Filled 2016-09-06 (×7): qty 1

## 2016-09-06 MED ORDER — POTASSIUM CHLORIDE CRYS ER 20 MEQ PO TBCR
40.0000 meq | EXTENDED_RELEASE_TABLET | Freq: Every day | ORAL | Status: DC
  Administered 2016-09-07 – 2016-09-10 (×4): 40 meq via ORAL
  Filled 2016-09-06 (×4): qty 2

## 2016-09-06 MED ORDER — VANCOMYCIN HCL 1000 MG IV SOLR
INTRAVENOUS | Status: DC | PRN
  Administered 2016-09-06: 1000 mg via TOPICAL

## 2016-09-06 MED ORDER — ONDANSETRON HCL 4 MG/2ML IJ SOLN
INTRAMUSCULAR | Status: DC | PRN
  Administered 2016-09-06: 4 mg via INTRAVENOUS

## 2016-09-06 MED ORDER — CEFAZOLIN SODIUM-DEXTROSE 2-4 GM/100ML-% IV SOLN
INTRAVENOUS | Status: AC
  Filled 2016-09-06: qty 100

## 2016-09-06 MED ORDER — THROMBIN 20000 UNITS EX SOLR
CUTANEOUS | Status: DC | PRN
  Administered 2016-09-06: 16:00:00 via TOPICAL

## 2016-09-06 MED ORDER — PANTOPRAZOLE SODIUM 40 MG IV SOLR: 40.0000 mg | Freq: Every day | INTRAVENOUS | Status: DC

## 2016-09-06 MED ORDER — FENTANYL CITRATE (PF) 100 MCG/2ML IJ SOLN
100.0000 ug | Freq: Once | INTRAMUSCULAR | Status: AC
  Administered 2016-09-06: 100 ug via INTRAVENOUS

## 2016-09-06 MED ORDER — BIOTIN 10 MG PO CAPS: 10.0000 mg | ORAL_CAPSULE | Freq: Every day | ORAL | Status: DC

## 2016-09-06 MED ORDER — LIDOCAINE-EPINEPHRINE 1 %-1:100000 IJ SOLN
INTRAMUSCULAR | Status: DC | PRN
  Administered 2016-09-06: 10 mL

## 2016-09-06 MED ORDER — DOCUSATE SODIUM 100 MG PO CAPS
100.0000 mg | ORAL_CAPSULE | Freq: Two times a day (BID) | ORAL | Status: DC
  Administered 2016-09-06 – 2016-09-10 (×8): 100 mg via ORAL
  Filled 2016-09-06 (×8): qty 1

## 2016-09-06 MED ORDER — FENTANYL CITRATE (PF) 100 MCG/2ML IJ SOLN
INTRAMUSCULAR | Status: AC
  Administered 2016-09-06: 100 ug via INTRAVENOUS
  Filled 2016-09-06: qty 2

## 2016-09-06 MED ORDER — FENTANYL CITRATE (PF) 250 MCG/5ML IJ SOLN
INTRAMUSCULAR | Status: AC
  Filled 2016-09-06: qty 5

## 2016-09-06 MED ORDER — LACTATED RINGERS IV SOLN
INTRAVENOUS | Status: DC | PRN
  Administered 2016-09-06 (×3): via INTRAVENOUS

## 2016-09-06 MED ORDER — FLEET ENEMA 7-19 GM/118ML RE ENEM: 1.0000 | ENEMA | RECTAL | Status: DC | PRN

## 2016-09-06 MED ORDER — SODIUM CHLORIDE 0.9% FLUSH: 3.0000 mL | INTRAVENOUS | Status: DC | PRN

## 2016-09-06 MED ORDER — CEFAZOLIN SODIUM-DEXTROSE 1-4 GM/50ML-% IV SOLN
1.0000 g | Freq: Three times a day (TID) | INTRAVENOUS | Status: AC
  Administered 2016-09-06 – 2016-09-08 (×6): 1 g via INTRAVENOUS
  Filled 2016-09-06 (×6): qty 50

## 2016-09-06 MED ORDER — SENNA 8.6 MG PO TABS
1.0000 | ORAL_TABLET | Freq: Two times a day (BID) | ORAL | Status: DC
  Administered 2016-09-06 – 2016-09-10 (×6): 8.6 mg via ORAL
  Filled 2016-09-06 (×7): qty 1

## 2016-09-06 SURGICAL SUPPLY — 75 items
ADH SKN CLS APL DERMABOND .7 (GAUZE/BANDAGES/DRESSINGS) ×3
APL SKNCLS STERI-STRIP NONHPOA (GAUZE/BANDAGES/DRESSINGS) ×1
BAG DECANTER FOR FLEXI CONT (MISCELLANEOUS) ×3 IMPLANT
BENZOIN TINCTURE PRP APPL 2/3 (GAUZE/BANDAGES/DRESSINGS) ×3 IMPLANT
BLADE CLIPPER SURG (BLADE) IMPLANT
BLADE SURG 11 STRL SS (BLADE) ×1 IMPLANT
BONE VIVIGEN FORMABLE 10CC (Bone Implant) ×3 IMPLANT
BUR CUTTER 7.0 ROUND (BURR) ×3 IMPLANT
BUR MATCHSTICK NEURO 3.0 LAGG (BURR) ×3 IMPLANT
CANISTER SUCT 3000ML PPV (MISCELLANEOUS) ×3 IMPLANT
CAP LOCKING THREADED (Cap) ×24 IMPLANT
CARTRIDGE OIL MAESTRO DRILL (MISCELLANEOUS) ×1 IMPLANT
CLOSURE WOUND 1/2 X4 (GAUZE/BANDAGES/DRESSINGS) ×2
CONNECTOR CREO 6.0 48-60MM (Connector) ×2 IMPLANT
CONT SPEC 4OZ CLIKSEAL STRL BL (MISCELLANEOUS) ×7 IMPLANT
COVER BACK TABLE 60X90IN (DRAPES) ×7 IMPLANT
DECANTER SPIKE VIAL GLASS SM (MISCELLANEOUS) ×3 IMPLANT
DERMABOND ADVANCED (GAUZE/BANDAGES/DRESSINGS) ×6
DERMABOND ADVANCED .7 DNX12 (GAUZE/BANDAGES/DRESSINGS) ×1 IMPLANT
DIFFUSER DRILL AIR PNEUMATIC (MISCELLANEOUS) ×3 IMPLANT
DRAPE C-ARM 42X72 X-RAY (DRAPES) ×3 IMPLANT
DRAPE C-ARMOR (DRAPES) ×3 IMPLANT
DRAPE HALF SHEET 40X57 (DRAPES) IMPLANT
DRAPE LAPAROTOMY 100X72X124 (DRAPES) ×3 IMPLANT
DRAPE POUCH INSTRU U-SHP 10X18 (DRAPES) ×3 IMPLANT
DRAPE SURG 17X23 STRL (DRAPES) ×3 IMPLANT
DRSG OPSITE 4X5.5 SM (GAUZE/BANDAGES/DRESSINGS) ×6 IMPLANT
DRSG OPSITE POSTOP 4X10 (GAUZE/BANDAGES/DRESSINGS) ×2 IMPLANT
DRSG OPSITE POSTOP 4X6 (GAUZE/BANDAGES/DRESSINGS) ×2 IMPLANT
DURAPREP 26ML APPLICATOR (WOUND CARE) ×3 IMPLANT
ELECT REM PT RETURN 9FT ADLT (ELECTROSURGICAL) ×3
ELECTRODE REM PT RTRN 9FT ADLT (ELECTROSURGICAL) ×1 IMPLANT
EVACUATOR 3/16  PVC DRAIN (DRAIN) ×2
EVACUATOR 3/16 PVC DRAIN (DRAIN) ×1 IMPLANT
GAUZE SPONGE 4X4 12PLY STRL (GAUZE/BANDAGES/DRESSINGS) ×3 IMPLANT
GAUZE SPONGE 4X4 16PLY XRAY LF (GAUZE/BANDAGES/DRESSINGS) ×3 IMPLANT
GLOVE BIO SURGEON STRL SZ7 (GLOVE) IMPLANT
GLOVE BIO SURGEON STRL SZ8 (GLOVE) ×6 IMPLANT
GLOVE BIOGEL PI IND STRL 7.0 (GLOVE) IMPLANT
GLOVE BIOGEL PI INDICATOR 7.0 (GLOVE)
GLOVE EXAM NITRILE LRG STRL (GLOVE) IMPLANT
GLOVE EXAM NITRILE XL STR (GLOVE) IMPLANT
GLOVE EXAM NITRILE XS STR PU (GLOVE) IMPLANT
GLOVE INDICATOR 8.5 STRL (GLOVE) ×6 IMPLANT
GOWN STRL REUS W/ TWL LRG LVL3 (GOWN DISPOSABLE) IMPLANT
GOWN STRL REUS W/ TWL XL LVL3 (GOWN DISPOSABLE) ×2 IMPLANT
GOWN STRL REUS W/TWL 2XL LVL3 (GOWN DISPOSABLE) IMPLANT
GOWN STRL REUS W/TWL LRG LVL3 (GOWN DISPOSABLE)
GOWN STRL REUS W/TWL XL LVL3 (GOWN DISPOSABLE) ×6
GRAFT BNE MATRIX VG FRMBL L 10 (Bone Implant) IMPLANT
KIT BASIN OR (CUSTOM PROCEDURE TRAY) ×3 IMPLANT
KIT INFUSE MEDIUM (Orthopedic Implant) ×2 IMPLANT
KIT ROOM TURNOVER OR (KITS) ×3 IMPLANT
NDL HYPO 25X1 1.5 SAFETY (NEEDLE) ×1 IMPLANT
NEEDLE HYPO 25X1 1.5 SAFETY (NEEDLE) ×3 IMPLANT
NS IRRIG 1000ML POUR BTL (IV SOLUTION) ×3 IMPLANT
OIL CARTRIDGE MAESTRO DRILL (MISCELLANEOUS) ×3
PACK LAMINECTOMY NEURO (CUSTOM PROCEDURE TRAY) ×3 IMPLANT
PAD ARMBOARD 7.5X6 YLW CONV (MISCELLANEOUS) ×9 IMPLANT
ROD (Rod) ×4 IMPLANT
SCREW CREO THREADED 5.5X40MM (Screw) ×12 IMPLANT
SPONGE LAP 4X18 X RAY DECT (DISPOSABLE) IMPLANT
SPONGE SURGIFOAM ABS GEL 100 (HEMOSTASIS) ×3 IMPLANT
STRIP BIOACTIVE 20CC 25X100X8 (Miscellaneous) ×4 IMPLANT
STRIP CLOSURE SKIN 1/2X4 (GAUZE/BANDAGES/DRESSINGS) ×4 IMPLANT
SUT VIC AB 0 CT1 18XCR BRD8 (SUTURE) ×2 IMPLANT
SUT VIC AB 0 CT1 8-18 (SUTURE) ×6
SUT VIC AB 2-0 CT1 18 (SUTURE) ×6 IMPLANT
SUT VIC AB 4-0 PS2 27 (SUTURE) ×3 IMPLANT
SWAB CULTURE LIQ STUART DBL (MISCELLANEOUS) ×4 IMPLANT
SWAB CULTURE LIQUID MINI MALE (MISCELLANEOUS) ×4 IMPLANT
TOWEL GREEN STERILE (TOWEL DISPOSABLE) ×3 IMPLANT
TOWEL GREEN STERILE FF (TOWEL DISPOSABLE) ×3 IMPLANT
TRAY FOLEY W/METER SILVER 16FR (SET/KITS/TRAYS/PACK) ×3 IMPLANT
WATER STERILE IRR 1000ML POUR (IV SOLUTION) ×3 IMPLANT

## 2016-09-06 NOTE — Anesthesia Procedure Notes (Signed)
Procedure Name: Intubation Date/Time: 09/06/2016 3:00 PM Performed by: Oletta Lamas Pre-anesthesia Checklist: Patient identified, Emergency Drugs available, Suction available and Patient being monitored Patient Re-evaluated:Patient Re-evaluated prior to induction Oxygen Delivery Method: Circle System Utilized Preoxygenation: Pre-oxygenation with 100% oxygen Induction Type: IV induction Ventilation: Mask ventilation without difficulty Laryngoscope Size: Mac and 3 Grade View: Grade I Tube type: Oral Number of attempts: 1 Airway Equipment and Method: Stylet Placement Confirmation: ETT inserted through vocal cords under direct vision,  positive ETCO2 and breath sounds checked- equal and bilateral Secured at: 21 cm Tube secured with: Tape Dental Injury: Teeth and Oropharynx as per pre-operative assessment

## 2016-09-06 NOTE — Progress Notes (Signed)
Patient requesting information on status of case. Patient and family updated. Patient complains of 9/10 back pain, requesting something while she continues to wait. Dr Krista Blue notified. Order for Fentanyl IV received. Patient states she is not allergic to Fentanyl, has n/v with po pain meds.

## 2016-09-06 NOTE — Progress Notes (Signed)
Care of pt assumed by MA Amil Bouwman RN 

## 2016-09-06 NOTE — Anesthesia Preprocedure Evaluation (Signed)
Anesthesia Evaluation  Patient identified by MRN, date of birth, ID band Patient awake    Reviewed: Allergy & Precautions, H&P , NPO status , Patient's Chart, lab work & pertinent test results, reviewed documented beta blocker date and time   Airway Mallampati: II  TM Distance: >3 FB Neck ROM: Full    Dental no notable dental hx. (+) Teeth Intact, Dental Advisory Given   Pulmonary neg pulmonary ROS,    Pulmonary exam normal breath sounds clear to auscultation       Cardiovascular hypertension, Pt. on medications and Pt. on home beta blockers + CAD, + CABG and +CHF  + dysrhythmias Atrial Fibrillation  Rhythm:Irregular Rate:Normal  Normal LV function with an EF of 55-60% without focal segmental wall motion abnormality.   Neuro/Psych  Headaches, PSYCHIATRIC DISORDERS Anxiety Depression    GI/Hepatic Neg liver ROS, GERD  Medicated and Controlled,  Endo/Other  Hypothyroidism   Renal/GU negative Renal ROS  negative genitourinary   Musculoskeletal  (+) Arthritis , Osteoarthritis,    Abdominal   Peds  Hematology negative hematology ROS (+) anemia ,   Anesthesia Other Findings   Reproductive/Obstetrics negative OB ROS                             Anesthesia Physical  Anesthesia Plan  ASA: III  Anesthesia Plan: General   Post-op Pain Management:    Induction: Intravenous  PONV Risk Score and Plan: 3 and Ondansetron, Dexamethasone and Diphenhydramine  Airway Management Planned: Oral ETT  Additional Equipment:   Intra-op Plan:   Post-operative Plan: Extubation in OR  Informed Consent: I have reviewed the patients History and Physical, chart, labs and discussed the procedure including the risks, benefits and alternatives for the proposed anesthesia with the patient or authorized representative who has indicated his/her understanding and acceptance.   Dental advisory given  Plan  Discussed with: CRNA and Anesthesiologist  Anesthesia Plan Comments:         Anesthesia Quick Evaluation

## 2016-09-06 NOTE — Progress Notes (Signed)
Patient states her pain has decreased to a level of 6/10. No complaints of n/v.

## 2016-09-06 NOTE — Transfer of Care (Signed)
Immediate Anesthesia Transfer of Care Note  Patient: Erin [name]  Procedure(s) Performed: Procedure(s): T10-L3 REVISION-EXPLORATION OF FUSION,REMOVAL HARDWARE L2-S1 (N/A)  Patient Location: PACU  Anesthesia Type:General  Level of Consciousness: awake, alert , oriented and patient cooperative  Airway & Oxygen Therapy: Patient Spontanous Breathing and Patient connected to nasal cannula oxygen  Post-op Assessment: Report given to RN and Post -op Vital signs reviewed and stable  Post vital signs: Reviewed and stable  Last Vitals:  Vitals:   09/06/16 1053 09/06/16 1821  BP: (!) 157/86 (P) 138/76  Pulse:    Resp:    Temp:  (P) 36.9 C    Last Pain:  Vitals:   09/06/16 1400  TempSrc:   PainSc: 6          Complications: No apparent anesthesia complications

## 2016-09-06 NOTE — Op Note (Signed)
Preoperative diagnosis: Lumbar instability L1-L2 L2 fracture with displacement of L2 pedicle screws mechanical back pain.  Osteomyelitis discitis  Postoperative diagnosis: Same  Procedure: Exploration of fusion removal of hardware L2-S1 with removal of bilateral loose L2 pedicle screws which were sent for culture and removal of rods and all top tightening nuts and cross-link  #2 pedicle screw fixation T10-S1 with globus bilateral new hydroxyapatite pedicle screws placed at T10, T11, and T12 and then tying into previously placed pedicle screws at L3, L5, and S1. With replacement of both rods replacement a cross-link.  #3 posterior lateral arthrodesis T10-L3 utilizing vivigen, kinex and BMP  #4 over reduction spinal deformity  Surgeon: Jillyn Hidden Taylore Hinde  Anesthesia: Gen.  EBL: Less than 300  History of present illness: Patient is a very pleasant 71 year old female with long-standing, care medical history who has an ongoing partially treated discitis osteomyelitis fractured her L2 vertebral body and displaced her L2 pedicle screws into the L1-2 disc space. With spinal instability at this level and mechanical back pain and recommended a reexploration of fusion removal of hardware and extension of her fusion up to T10. I've extensively gone over the risks and benefits of that operation with her as well as perioperative course expectations of outcome and alternatives to surgery and she understood and agreed to proceed forward.  Operative procedure: Patient brought into the or was induced on general anesthesia positioned prone the Wilson frame her back was prepped and draped in routine sterile fashion her old incision was opened up and extended cephalad subperiosteal dissections care lamina of T10, T11, T12, and then exposed the construct from L2 down to S1. I disconnected the nuts disconnected across and remove the rods sent all those for culture in addition I also extracted the L2 pedicle screws which had  disc material and inflammatory material on the threads these were also sent separately for culture then I packed this away and carried out the remainder of the dissection and then under AP and lateral fluoroscopy placed thoracic pedicle screws at T10, T11, T12. I used the global globus HA pedicle screw set with 5 5 x 40 mm pedicle screws at placed at those levels. Then the wound scope was irrigated meticulous hemostasis was maintained aggressive decortication was care of the along the TPs laminar complexes all the way down from T10-L3. Then the vivigen,kinex and BMP add mixture was laid along the TPs lamina and posterior lateral space from T10 down L3. Then I anchored all the rods in place fashion the rods tightened the nuts placed a cross-link I decompress the T12 screw against the L3 to reduce some of her kyphotic deformity. Then the place a large Hemovac drain sprinkled vancomycin powder and the wound injected exparel in the fascia and closed the wound in layers with interrupted Vicryl and a running 4 subcuticular Dermabond benzo and Steri-Strips and sterile dressings applied.
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
## 2016-09-07 ENCOUNTER — Telehealth: Payer: Self-pay | Admitting: Infectious Diseases

## 2016-09-07 DIAGNOSIS — M4620 Osteomyelitis of vertebra, site unspecified: Secondary | ICD-10-CM

## 2016-09-07 DIAGNOSIS — Z9889 Other specified postprocedural states: Secondary | ICD-10-CM

## 2016-09-07 DIAGNOSIS — I480 Paroxysmal atrial fibrillation: Secondary | ICD-10-CM

## 2016-09-07 DIAGNOSIS — M532X6 Spinal instabilities, lumbar region: Principal | ICD-10-CM

## 2016-09-07 DIAGNOSIS — Z951 Presence of aortocoronary bypass graft: Secondary | ICD-10-CM

## 2016-09-07 LAB — HERPES SIMPLEX VIRUS(HSV) DNA BY PCR
HSV 1 DNA: DETECTED — AB
HSV 2 DNA: NOT DETECTED

## 2016-09-07 MED ORDER — ENSURE ENLIVE PO LIQD
237.0000 mL | Freq: Two times a day (BID) | ORAL | Status: DC
  Administered 2016-09-07 – 2016-09-08 (×2): 237 mL via ORAL

## 2016-09-07 MED ORDER — BISACODYL 10 MG RE SUPP
10.0000 mg | Freq: Every day | RECTAL | Status: DC | PRN
  Administered 2016-09-08 – 2016-09-10 (×2): 10 mg via RECTAL
  Filled 2016-09-07 (×2): qty 1

## 2016-09-07 NOTE — Progress Notes (Signed)
Subjective: Patient reports She feels that she's felt and several weeks. Significant improvement right looks to be function strength and pain.  Objective: Vital signs in last 24 hours: Temp:  [97.5 F (36.4 C)-98.4 F (36.9 C)] 98 F (36.7 C) (07/20 1130) Pulse Rate:  [77-85] 82 (07/20 1130) Resp:  [16-24] 23 (07/20 1130) BP: (136-170)/(76-96) 136/81 (07/20 1130) SpO2:  [90 %-99 %] 97 % (07/20 1130) Weight:  [72.1 kg (159 lb)] 72.1 kg (159 lb) (07/20 1000)  Intake/Output from previous day: 07/19 0701 - 07/20 0700 In: 3120 [I.V.:2200; IV Piggyback:50] Out: 665 [Urine:340; Drains:50; Blood:275] Intake/Output this shift: Total I/O In: 240 [P.O.:240] Out: 100 [Urine:100]  4+ out of 5 right lower extremity left looks to be 5 out of 5 incision clean dry and intact  Lab Results:  Recent Labs  09/05/16 1208 09/06/16 1038  WBC 8.1  --   HGB 10.5* 11.2*  HCT 31.7* 33.0*  PLT 169  --    BMET  Recent Labs  09/05/16 1208 09/06/16 1038  NA 127* 130*  K 3.7 3.5  CL 93*  --   CO2 26  --   GLUCOSE 100* 81  BUN 6  --   CREATININE 0.78  --   CALCIUM 9.1  --     Studies/Results: Dg Thoracolumabar Spine  Result Date: 09/07/2016 CLINICAL DATA:  Thoracolumbar spine surgery . EXAM: THORACOLUMBAR SPINE 1V COMPARISON:  08/27/2016. FINDINGS: Metallic marker noted over a mid to lower thoracic vertebral body. Thoracolumbar spine fusion. Hardware intact. Anatomic alignment stable mild lower thoracic upper lumbar compression fracture. IMPRESSION: Postsurgical changes thoracolumbar spine. Electronically Signed   By: Maisie Fus  Register   On: 09/07/2016 06:30   Dg C-arm 1-60 Min  Result Date: 09/07/2016 CLINICAL DATA:  Thoracolumbar spine surgery. EXAM: DG C-ARM 61-120 MIN COMPARISON:  07/12/2016. FINDINGS: Metallic marker noted over the mid to lower thoracic spine. Thoracolumbar spine fusion. Hardware intact. Anatomic alignment by stable compression fracture lower thoracic upper lumbar spine  . IMPRESSION: Postsurgical changes thoracolumbar spine . Electronically Signed   By: Maisie Fus  Register   On: 09/07/2016 06:31    Assessment/Plan: Postop day 1 Vicryl lumbar revision surgery doing very well continue to mobilize with physical and occupational therapy continue to observe an stepdown for 24 more hours by transfer the floor tomorrow if stable. Cardiology and infectious disease are aware of the patient.  LOS: 1 day     Anthem Frazer P 09/07/2016, 12:24 PM

## 2016-09-07 NOTE — Anesthesia Postprocedure Evaluation (Signed)
Anesthesia Post Note  Patient: Erin [name]  Procedure(s) Performed: Procedure(s) (LRB): T10-L3 REVISION-EXPLORATION OF FUSION,REMOVAL HARDWARE L2-S1 (N/A)     Patient location during evaluation: Other Anesthesia Type: General Level of consciousness: awake and alert Pain management: pain level controlled Vital Signs Assessment: post-procedure vital signs reviewed and stable Respiratory status: spontaneous breathing, nonlabored ventilation, respiratory function stable and patient connected to nasal cannula oxygen Cardiovascular status: blood pressure returned to baseline and stable Postop Assessment: no signs of nausea or vomiting Anesthetic complications: no    Last Vitals:  Vitals:   09/07/16 0429 09/07/16 0437  BP: (!) 170/93 (!) 151/84  Pulse: 78 81  Resp: 18 17  Temp: 36.6 C     Last Pain:  Vitals:   09/07/16 0429  TempSrc: Oral  PainSc:                  [name]
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
## 2016-09-07 NOTE — Evaluation (Signed)
Occupational Therapy Evaluation Patient Details Name: Erin [name] MRN: [id number] DOB: 1945/09/02 Today's Date: 09/07/2016    History of Present Illness Pt is a 71 yo female s/p T10-L3 revision and removal of hardware from L2-S1 09/06/16. PMH of CAD/CABG and PAF. She has had recent CABG by Dr Dorris Fetch 05/30/16 with LAA clipping. Recent course complicated by Ecoli sepsis and paravertebral abscess post lumbar laminectomy in March with revision by Dr Wynetta Emery. 06/26/16.  She has had recurrent PAF - back on Eliquis PAF as well as loaded with amiodarone and has had TEE cardioversion 07/04/16 and there was thrombus in her LAA but clip was intact with no communication to her LA and no other thrombus. She went back into rapid atrial flutter with rates in the 120s. She was symptomatic with palpitations. She required repeat TEE cardioversion 07/2016 back to NSR. EF has dropped to 40-45% with moderate MR   Clinical Impression   Pt admitted with above. She demonstrates the below listed deficits and will benefit from continued OT to maximize safety and independence with BADLs.  Pt is very motivated, and did very well on eval.  She requires min guard - min a for functional mobility and min - mod a for ADLs.  She has been at SNF, and plans to return to SNF at discharge for continued rehab before home.  Will follow acutely.       Follow Up Recommendations  SNF    Equipment Recommendations  None recommended by OT    Recommendations for Other Services       Precautions / Restrictions Precautions Precautions: Back Precaution Booklet Issued: No Precaution Comments: pt able to recall 3/3 back precautions  Required Braces or Orthoses: Spinal Brace Spinal Brace: Applied in sitting position Restrictions Weight Bearing Restrictions: No      Mobility Bed Mobility Overal bed mobility: Needs Assistance Bed Mobility: Sit to Sidelying         Sit to sidelying: Min assist General bed mobility comments: assist  to lift LEs into bed   Transfers Overall transfer level: Needs assistance Equipment used: Rolling walker (2 wheeled);None Transfers: Pharmacologist;Sit to/from Stand Sit to Stand: Min guard Stand pivot transfers: Min guard            Balance Overall balance assessment: Needs assistance Sitting-balance support: No upper extremity supported;Feet supported Sitting balance-Leahy Scale: Good     Standing balance support: No upper extremity supported Standing balance-Leahy Scale: Fair Standing balance comment: min guard for static standing                            ADL either performed or assessed with clinical judgement   ADL Overall ADL's : Needs assistance/impaired Eating/Feeding: Independent   Grooming: Wash/dry hands;Wash/dry face;Oral care;Brushing hair;Set up;Sitting   Upper Body Bathing: Set up;Sitting   Lower Body Bathing: Moderate assistance;Sit to/from stand   Upper Body Dressing : Minimal assistance;Sitting   Lower Body Dressing: Maximal assistance;Sit to/from stand   Toilet Transfer: Ambulation;BSC;RW;Min guard   Toileting- Architect and Hygiene: Minimal assistance;Sit to/from stand       Functional mobility during ADLs: Minimal assistance;Min guard;Rolling walker       Vision         Perception     Praxis      Pertinent Vitals/Pain Pain Assessment: 0-10 Pain Score: 6  Pain Location: back  Pain Descriptors / Indicators: Aching;Constant Pain Intervention(s): Monitored during session;Repositioned (Pt states she does  not want pain meds )     Hand Dominance Right   Extremity/Trunk Assessment Upper Extremity Assessment Upper Extremity Assessment: Overall WFL for tasks assessed   Lower Extremity Assessment Lower Extremity Assessment: Defer to PT evaluation       Communication Communication Communication: No difficulties   Cognition Arousal/Alertness: Awake/alert Behavior During Therapy: WFL for tasks  assessed/performed Overall Cognitive Status: Within Functional Limits for tasks assessed                                     General Comments  VSS.      Exercises     Shoulder Instructions      Home Living Family/patient expects to be discharged to:: Skilled nursing facility                                 Additional Comments: Pt has been at SNF       Prior Functioning/Environment Level of Independence: Needs assistance  Gait / Transfers Assistance Needed: reports was ambulating short distances with RW and assist at SNF  ADL's / Homemaking Assistance Needed: Pt reports she was receiving assist for ADLs at SNF             OT Problem List: Decreased strength;Decreased activity tolerance;Decreased safety awareness;Decreased knowledge of use of DME or AE;Decreased knowledge of precautions;Pain      OT Treatment/Interventions: Self-care/ADL training;DME and/or AE instruction;Therapeutic activities;Patient/family education;Balance training    OT Goals(Current goals can be found in the care plan section) Acute Rehab OT Goals Patient Stated Goal: to get back to normal  OT Goal Formulation: With patient Time For Goal Achievement: 09/21/16 Potential to Achieve Goals: Good ADL Goals Pt Will Perform Grooming: with supervision;standing Pt Will Perform Lower Body Bathing: with supervision;with adaptive equipment;sit to/from stand Pt Will Perform Lower Body Dressing: with supervision;with adaptive equipment;sit to/from stand Pt Will Transfer to Toilet: with supervision;ambulating;regular height toilet;bedside commode;grab bars  OT Frequency: Min 2X/week   Barriers to D/C: Decreased caregiver support          Co-evaluation              AM-PAC PT "6 Clicks" Daily Activity     Outcome Measure Help from another person eating meals?: None Help from another person taking care of personal grooming?: A Little Help from another person toileting,  which includes using toliet, bedpan, or urinal?: A Little Help from another person bathing (including washing, rinsing, drying)?: A Lot Help from another person to put on and taking off regular upper body clothing?: A Little Help from another person to put on and taking off regular lower body clothing?: A Lot 6 Click Score: 17   End of Session Equipment Utilized During Treatment: Rolling walker;Oxygen Nurse Communication: Mobility status  Activity Tolerance: Patient limited by fatigue;Patient limited by pain Patient left: in bed;with call bell/phone within reach;with family/visitor present  OT Visit Diagnosis: Pain Pain - part of body:  (back )                Time: 2409-7353 OT Time Calculation (min): 28 min Charges:  OT General Charges $OT Visit: 1 Procedure OT Evaluation $OT Eval Moderate Complexity: 1 Procedure OT Treatments $Self Care/Home Management : 8-22 mins G-Codes:     [employer], OTR/L [phone]   Araeya Lamb M 09/07/2016, 1:07 PM

## 2016-09-07 NOTE — Care Management Note (Signed)
Case Management Note Donn Pierini RN, BSN Unit 4E-Case Manager [phone]  Patient Details  Name: Erin [name] MRN: [id number] Date of Birth: [birth year]  Subjective/Objective:  Pt admitted s/p T10-L3 revision spinal fusion- pt with extensive hx and complicated hospital stay on last admission with long recovery                  Action/Plan: On last admission pt was discharged to SNF [facility]) for continued rehab on IV abx (8 wks)- Prior to that pt lived at home with son. PT/OT evals have been ordered and are pending- CM to follow for recommendations and d/c needs following this surgery.   Expected Discharge Date:                  Expected Discharge Plan:   unknown.  In-House Referral:     Discharge planning Services  CM Consult  Post Acute Care Choice:    Choice offered to:     DME Arranged:    DME Agency:     HH Arranged:    HH Agency:     Status of Service:  In process, will continue to follow  If discussed at Long Length of Stay Meetings, dates discussed:    Discharge Disposition:   Additional Comments:  Darrold Span, RN 09/07/2016, 10:19 AM

## 2016-09-07 NOTE — Telephone Encounter (Signed)
A user error has taken place: charting done on wrong patient and has been corrected.
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
## 2016-09-07 NOTE — Evaluation (Signed)
Physical Therapy Evaluation Patient Details Name: Erin [name] MRN: [id number] DOB: Mar 08, 1945 Today's Date: 09/07/2016   History of Present Illness  Pt is a 71 yo female s/p T10-L3 revision and removal of hardware from L2-S1 09/06/16. PMH of CAD/CABG and PAF. She has had recent CABG by Dr Dorris Fetch 05/30/16 with LAA clipping. Recent course complicated by Ecoli sepsis and paravertebral abscess post lumbar laminectomy in March with revision by Dr Wynetta Emery. 06/26/16.  She has had recurrent PAF - back on Eliquis PAF as well as loaded with amiodarone and has had TEE cardioversion 07/04/16 and there was thrombus in her LAA but clip was intact with no communication to her LA and no other thrombus. She went back into rapid atrial flutter with rates in the 120s. She was symptomatic with palpitations. She required repeat TEE cardioversion 07/2016 back to NSR. EF has dropped to 40-45% with moderate MR  Clinical Impression  Patient is s/p above surgery resulting in functional limitations due to the deficits listed below (see PT Problem List). Pt is currently minA for bed mobility and transfers and min guard for ambulation of 3 feet with RW. Pt able to recall 3/3 back precautions. Patient will benefit from skilled PT to increase their independence and safety with mobility to allow discharge to the venue listed below.       Follow Up Recommendations SNF    Equipment Recommendations  Other (comment) (to be determined at next venue)    Recommendations for Other Services OT consult     Precautions / Restrictions Precautions Precautions: Back Required Braces or Orthoses:  (no brace needed) Restrictions Weight Bearing Restrictions: No      Mobility  Bed Mobility Overal bed mobility: Needs Assistance Bed Mobility: Rolling;Sidelying to Sit Rolling: Min guard Sidelying to sit: Min assist       General bed mobility comments: vc for proper log rolling technique, minA for trunk to upright in sidelying to sit  and pad scoot of hips to EoB  Transfers Overall transfer level: Needs assistance Equipment used: Rolling walker (2 wheeled);None Transfers: Pharmacologist;Sit to/from Stand Sit to Stand: Min guard Stand pivot transfers: Min assist       General transfer comment: minA for powerup for stand pivot to BSC, min guard for power up with sit>stand from [facility]  Ambulation/Gait Ambulation/Gait assistance: Min guard Ambulation Distance (Feet): 3 Feet Assistive device: Rolling walker (2 wheeled) Gait Pattern/deviations: Shuffle;Decreased stride length;Step-to pattern Gait velocity: decreased Gait velocity interpretation: Below normal speed for age/gender General Gait Details: min guard for safety, vc for sequencing and mangement of RW     Balance Overall balance assessment: Needs assistance Sitting-balance support: No upper extremity supported;Feet supported Sitting balance-Leahy Scale: Good     Standing balance support: No upper extremity supported;During functional activity Standing balance-Leahy Scale: Good Standing balance comment: able to perform pericare in standing                             Pertinent Vitals/Pain Pain Assessment: 0-10 Pain Score: 8  Pain Location: back  Pain Descriptors / Indicators: Aching;Constant Pain Intervention(s): Monitored during session;Limited activity within patient's tolerance;Premedicated before session    Home Living Family/patient expects to be discharged to:: Skilled nursing facility                      Prior Function Level of Independence: Independent with assistive device(s)  Comments: able to get in and out of bed and walk to bathroom with assist      Hand Dominance   Dominant Hand: Right    Extremity/Trunk Assessment   Upper Extremity Assessment Upper Extremity Assessment: Overall WFL for tasks assessed    Lower Extremity Assessment Lower Extremity Assessment: Generalized weakness;RLE  deficits/detail RLE Deficits / Details: c/o of R LE numbness following surgery which has since resolved       Communication   Communication: No difficulties  Cognition Arousal/Alertness: Awake/alert Behavior During Therapy: WFL for tasks assessed/performed Overall Cognitive Status: Within Functional Limits for tasks assessed                                        General Comments General comments (skin integrity, edema, etc.): prior to activitiy, BP 130/72, SaO2 on 2L O2 via nasal cannula 98%, HR 84 bpm, saO2 on RA dropped to 87%O2, nasal cannula replaced, max HR with activity 105 bpm, after activity BP 142/81, SaO2 on 2L O2 via nsal cannula 97%O2, HR 83 bpm        Assessment/Plan    PT Assessment Patient needs continued PT services  PT Problem List Decreased strength;Decreased activity tolerance;Decreased mobility;Pain       PT Treatment Interventions Gait training;Stair training;Functional mobility training;Therapeutic activities;Therapeutic exercise;Balance training;Patient/family education    PT Goals (Current goals can be found in the Care Plan section)  Acute Rehab PT Goals Patient Stated Goal: get stronger PT Goal Formulation: With patient Time For Goal Achievement: 09/14/16 Potential to Achieve Goals: Good    Frequency Min 2X/week    AM-PAC PT "6 Clicks" Daily Activity  Outcome Measure Difficulty turning over in bed (including adjusting bedclothes, sheets and blankets)?: A Little Difficulty moving from lying on back to sitting on the side of the bed? : Total Difficulty sitting down on and standing up from a chair with arms (e.g., wheelchair, bedside commode, etc,.)?: Total Help needed moving to and from a bed to chair (including a wheelchair)?: A Little Help needed walking in hospital room?: A Little Help needed climbing 3-5 steps with a railing? : Total 6 Click Score: 12    End of Session Equipment Utilized During Treatment: Gait  belt Activity Tolerance: Patient tolerated treatment well Patient left: in chair;with call bell/phone within reach;with chair alarm set Nurse Communication: Mobility status;Other (comment) (IV infusion finished) PT Visit Diagnosis: Unsteadiness on feet (R26.81);Other abnormalities of gait and mobility (R26.89);Muscle weakness (generalized) (M62.81);Pain Pain - part of body:  (back)    Time: 1610-9604 PT Time Calculation (min) (ACUTE ONLY): 40 min   Charges:   PT Evaluation $PT Eval Moderate Complexity: 1 Procedure PT Treatments $Gait Training: 8-22 mins $Therapeutic Activity: 8-22 mins   PT G Codes:        Justus Duerr B. Beverely Risen PT, DPT Acute Rehabilitation  [phone] Pager [phone]    Elon Alas Fleet 09/07/2016, 10:28 AM

## 2016-09-08 DIAGNOSIS — M4640 Discitis, unspecified, site unspecified: Secondary | ICD-10-CM

## 2016-09-08 DIAGNOSIS — M1990 Unspecified osteoarthritis, unspecified site: Secondary | ICD-10-CM
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
## 2016-09-08 NOTE — Progress Notes (Addendum)
[facility]   Reason for visit: Follow up on osteomyelitis, discitis  Interval History: no positive culture growth  Physical Exam: Constitutional:  Vitals:   09/08/16 0432 09/08/16 0800  BP: (!) 153/75 134/82  Pulse: 83 84  Resp: (!) 21 (!) 25  Temp: 98.7 F (37.1 C) 99.5 F (37.5 C)   patient appears in NAD Respiratory: Normal respiratory effort; CTA B Cardiovascular: RRR GI: soft, nt, nd  Review of Systems: Constitutional: negative for fevers, chills and malaise Respiratory: negative for cough Gastrointestinal: negative for nausea and diarrhea Integument/breast: negative for rash  Lab Results  Component Value Date   WBC 8.1 09/05/2016   HGB 11.2 (L) 09/06/2016   HCT 33.0 (L) 09/06/2016   MCV 89.0 09/05/2016   PLT 169 09/05/2016    Lab Results  Component Value Date   CREATININE 0.78 09/05/2016   BUN 6 09/05/2016   NA 130 (L) 09/06/2016   K 3.5 09/06/2016   CL 93 (L) 09/05/2016   CO2 26 09/05/2016    Lab Results  Component Value Date   ALT 3 08/14/2016   AST 12 08/14/2016   ALKPHOS 174 (H) 08/14/2016     Microbiology: Recent Results (from the past 240 hour(s))  Surgical pcr screen     Status: None   Collection Time: 09/05/16 12:08 PM  Result Value Ref Range Status   MRSA, PCR NEGATIVE NEGATIVE Final   Staphylococcus aureus NEGATIVE NEGATIVE Final    Comment:        The Xpert SA Assay (FDA approved for NASAL specimens in patients over 4 years of age), is one component of a comprehensive surveillance program.  Test performance has been validated by [facility] for patients greater than or equal to 32 year old. It is not intended to diagnose infection nor to guide or monitor treatment.   Aerobic/Anaerobic Culture (surgical/deep wound)     Status: None (Preliminary result)   Collection Time: 09/06/16  3:53 PM  Result Value Ref Range Status   Specimen Description BACK RIGHT  Final   Special Requests RIGHT LUMBAR  TWO DISC SPACE POF ANCEF  Final   Gram Stain   Final    RARE WBC PRESENT, PREDOMINANTLY MONONUCLEAR NO ORGANISMS SEEN    Culture NO GROWTH < 24 HOURS  Final   Report Status PENDING  Incomplete  Aerobic/Anaerobic Culture (surgical/deep wound)     Status: None (Preliminary result)   Collection Time: 09/06/16  3:56 PM  Result Value Ref Range Status   Specimen Description BACK LEFT  Final   Special Requests LEFT L2 DISC SPACE POF ANCEF  Final   Gram Stain   Final    FEW WBC PRESENT,BOTH PMN AND MONONUCLEAR NO ORGANISMS SEEN    Culture NO GROWTH < 24 HOURS  Final   Report Status PENDING  Incomplete  Aerobic/Anaerobic Culture (surgical/deep wound)     Status: None (Preliminary result)   Collection Time: 09/06/16  3:57 PM  Result Value Ref Range Status   Specimen Description WOUND  Final   Special Requests SCREWS POF ANCEF  Final   Gram Stain   Final    FEW WBC PRESENT,BOTH PMN AND MONONUCLEAR NO ORGANISMS SEEN    Culture NO GROWTH < 24 HOURS  Final   Report Status PENDING  Incomplete    Impression/Plan:  1. Discitis, osteomyelitis - no positive cultures so far, no indication for antibiotic therapy 2. picc line - will remove at discharge if no positive  cultures  I will follow up on Monday

## 2016-09-08 NOTE — Progress Notes (Signed)
Pt seen and examined.  No issues overnight. Has some back soreness as expected but denies radicular symptoms or paresthesias No concerns this am  EXAM: Temp:  [98 F (36.7 C)-99.5 F (37.5 C)] 99.5 F (37.5 C) (07/21 0800) Pulse Rate:  [75-97] 84 (07/21 0800) Resp:  [13-26] 25 (07/21 0800) BP: (129-153)/(72-84) 134/82 (07/21 0800) SpO2:  [93 %-98 %] 93 % (07/21 0800) Intake/Output      07/20 0701 - 07/21 0700 07/21 0701 - 07/22 0700   P.O. 480 240   Total Intake(mL/kg) 480 (6.7) 240 (3.3)   Urine (mL/kg/hr) 410 (0.2)    Drains 220    Total Output 630     Net -150 +240         Awake and alert Follows commands throughout Full strength Incision c/d/i  Plan Doing well from surgical perspective Will await culture results and ID clearance

## 2016-09-08 NOTE — Clinical Social Work Note (Signed)
Clinical Social Work Assessment  Patient Details  Name: Erin [name] MRN: [id number] Date of Birth: 11/21/1945  Date of referral:  09/08/16               Reason for consult:  Facility Placement, Discharge Planning                Permission sought to share information with:  Chartered certified accountant granted to share information::  Yes, Verbal Permission Granted  Name::        Agency::  [facility]  Relationship::     Contact Information:     Housing/Transportation Living arrangements for the past 2 months:  [place] of Information:  Patient, Medical Team Patient Interpreter Needed:  None Criminal Activity/Legal Involvement Pertinent to Current Situation/Hospitalization:  No - Comment as needed Significant Relationships:  Adult Children Lives with:  Facility Resident Do you feel safe going back to the place where you live?  Yes Need for family participation in patient care:  Yes (Comment)  Care giving concerns:  Patient is a short-term rehab resident at [facility]. PT recommending SNF once medically stable for discharge.   Social Worker assessment / plan:  CSW met with patient. No supports at bedside. CSW introduced role and explained that PT recommendations would be discussed. Patient confirmed that she was admitted from [facility] and plans to return once stable for discharge. No further concerns. CSW encouraged patient to contact CSW as needed. CSW will continue to follow patient for support and facilitate discharge to SNF once medically stable.  Employment status:  Retired Nurse, adult PT Recommendations:  [place] / Referral to community resources:  [place]  Patient/Family's Response to care:  Patient agreeable to return to SNF. Patient's children supportive and involved in patient's care. Patient appreciated social work  intervention.  Patient/Family's Understanding of and Emotional Response to Diagnosis, Current Treatment, and Prognosis:  Patient has a good understanding of the reason for admission and her need for continued rehab prior to returning home. Patient appears happy with hospital care.  Emotional Assessment Appearance:  Appears stated age Attitude/Demeanor/Rapport:  Other (Pleasant) Affect (typically observed):  Accepting, Appropriate, Calm, Pleasant Orientation:  Oriented to Self, Oriented to Place, Oriented to Situation, Oriented to  Time Alcohol / Substance use:  Never Used Psych involvement (Current and /or in the community):  No (Comment)  Discharge Needs  Concerns to be addressed:  Care Coordination Readmission within the last 30 days:  No Current discharge risk:  Dependent with Mobility Barriers to Discharge:  [employer], Continued Medical Work up   Candie Chroman, LCSW 09/08/2016, 10:47 AM

## 2016-09-09 MED ORDER — SODIUM CHLORIDE 0.9% FLUSH
10.0000 mL | INTRAVENOUS | Status: DC | PRN
  Administered 2016-09-10: 10 mL
  Filled 2016-09-09: qty 40

## 2016-09-09 MED ORDER — SODIUM CHLORIDE 0.9% FLUSH
10.0000 mL | Freq: Two times a day (BID) | INTRAVENOUS | Status: DC
  Administered 2016-09-09: 10 mL

## 2016-09-09 NOTE — Consult Note (Signed)
WOC Nurse wound consult note Reason for Consult:skin tear (avulsion), left forearm Wound type:traumatic skin injury related to medical adhesives Pressure Injury POA: N/A Measurement:2.5cm round x 0.1cm Wound bed:red, moist Drainage (amount, consistency, odor) scant amount of dried serosanguinous exudate at periphery Periwound:intact, dry Dressing procedure/placement/frequency: I will implement a POC for tissue loss using NS cleansing and covering with white petrolatum gauze (Vaseline). This will be atraumatic upon removal if changed twice daily.  WOC nursing team will not follow, but will remain available to this patient, the nursing and medical teams.  Please re-consult if needed. Thanks, Ladona Mow, MSN, RN, GNP, [place]  Pager# [phone]

## 2016-09-09 NOTE — Progress Notes (Signed)
Wound care provided by wound care nurse @ [phone]

## 2016-09-09 NOTE — Progress Notes (Signed)
Pt seen and examined.  No issues overnight. Has some back soreness as expected No concerns this am  EXAM: Temp:  [98 F (36.7 C)-99.5 F (37.5 C)] 98.9 F (37.2 C) (07/22 0430) Pulse Rate:  [80-85] 85 (07/22 0430) Resp:  [20-26] 21 (07/22 0430) BP: (134-158)/(67-82) 136/67 (07/22 0430) SpO2:  [92 %-96 %] 92 % (07/22 0430) Weight:  [77.5 kg (170 lb 12.8 oz)] 77.5 kg (170 lb 12.8 oz) (07/22 0430) Intake/Output      07/21 0701 - 07/22 0700 07/22 0701 - 07/23 0700   P.O. 240    Total Intake(mL/kg) 240 (3.1)    Drains 200    Total Output 200     Net +40          Urine Occurrence 2 x    Stool Occurrence 2 x     Awake and alert Follows commands throughout Full strength Incision c/d/i  Plan Doing well from surgical perspective Will await culture results and ID clearance

## 2016-09-10 DIAGNOSIS — M4646 Discitis, unspecified, lumbar region: Secondary | ICD-10-CM

## 2016-09-10 DIAGNOSIS — M4626 Osteomyelitis of vertebra, lumbar region: Secondary | ICD-10-CM

## 2016-09-10 MED ORDER — CYCLOBENZAPRINE HCL 10 MG PO TABS: 10.0000 mg | ORAL_TABLET | Freq: Three times a day (TID) | ORAL | 0 refills | Status: DC | PRN

## 2016-09-10 MED ORDER — SODIUM CHLORIDE 0.9% FLUSH: 10.0000 mL | Freq: Two times a day (BID) | INTRAVENOUS | Status: DC

## 2016-09-10 MED ORDER — TRAMADOL HCL 50 MG PO TABS: 100.0000 mg | ORAL_TABLET | ORAL | 0 refills | Status: DC | PRN

## 2016-09-10 MED ORDER — SODIUM CHLORIDE 0.9% FLUSH: 10.0000 mL | INTRAVENOUS | Status: DC | PRN

## 2016-09-10 NOTE — Discharge Summary (Signed)
Physician Discharge Summary  Patient ID: Erin [name] MRN: [id number] DOB/AGE: [birth year] 71 y.o.  Admit date: 09/06/2016 Discharge date: 09/10/2016  Admission Diagnoses:Lumbar instability L1-L2 L2 fracture with displacement of L2 pedicle screws mechanical back pain.  Osteomyelitis discitis   Discharge Diagnoses: Lumbar instability L1-L2 L2 fracture with displacement of L2 pedicle screws mechanical back pain.  Osteomyelitis discitis   Discharged Condition: good  Hospital Course: The patient was admitted on 09/06/2016 and taken to the operating room where the patient underwent Exploration of fusion removal of hardware L2-S1 with removal of bilateral loose L2 pedicle screws which were sent for culture and removal of rods and all top tightening nuts and cross-link. The patient tolerated the procedure well and was taken to the recovery room and then to the floor in stable condition. The hospital course was routine. There were no complications. The wound remained clean dry and intact. Pt had appropriate back soreness. No complaints of new pain or new N/T/W. The patient remained afebrile with stable vital signs, and tolerated a regular diet. The patient continued to increase activities, and pain was well controlled with oral pain medications.   Consults: cardiology and ID  Significant Diagnostic Studies:  Results for orders placed or performed during the hospital encounter of 09/06/16  Aerobic/Anaerobic Culture (surgical/deep wound)  Result Value Ref Range   Specimen Description BACK RIGHT    Special Requests RIGHT LUMBAR TWO DISC SPACE POF ANCEF    Gram Stain      RARE WBC PRESENT, PREDOMINANTLY MONONUCLEAR NO ORGANISMS SEEN    Culture      NO GROWTH 4 DAYS NO ANAEROBES ISOLATED; CULTURE IN PROGRESS FOR 5 DAYS   Report Status PENDING   Aerobic/Anaerobic Culture (surgical/deep wound)  Result Value Ref Range   Specimen Description BACK LEFT    Special Requests LEFT L2 DISC SPACE POF ANCEF     Gram Stain      FEW WBC PRESENT,BOTH PMN AND MONONUCLEAR NO ORGANISMS SEEN    Culture      NO GROWTH 4 DAYS NO ANAEROBES ISOLATED; CULTURE IN PROGRESS FOR 5 DAYS   Report Status PENDING   Aerobic/Anaerobic Culture (surgical/deep wound)  Result Value Ref Range   Specimen Description WOUND    Special Requests SCREWS POF ANCEF    Gram Stain      FEW WBC PRESENT,BOTH PMN AND MONONUCLEAR NO ORGANISMS SEEN    Culture      NO GROWTH 4 DAYS NO ANAEROBES ISOLATED; CULTURE IN PROGRESS FOR 5 DAYS   Report Status PENDING   I-STAT 4, (NA,K, GLUC, HGB,HCT)  Result Value Ref Range   Sodium 130 (L) 135 - 145 mmol/L   Potassium 3.5 3.5 - 5.1 mmol/L   Glucose, Bld 81 65 - 99 mg/dL   HCT 40.9 (L) 81.1 - 91.4 %   Hemoglobin 11.2 (L) 12.0 - 15.0 g/dL    Dg Chest 2 View  Result Date: 08/27/2016 CLINICAL DATA:  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 No new infiltrate or overt edema. Heart size upper limits normal. Tortuous mildly atheromatous thoracic aortic. Previous CABG. Clip on the left atrial appendage stable. Left arm PICC line to the cavoatrial junction. No effusion.  No pneumothorax. Cervical fixation hardware noted. Lumbar compression deformity and fixation hardware partially visualized. IMPRESSION: Stable chronic and postop changes. No acute or superimposed abnormality. Electronically Signed  By: Corlis Leak M.D.   On: 08/27/2016 14:37   Dg Thoracolumabar Spine  Result Date: 09/07/2016 CLINICAL DATA:  Thoracolumbar spine surgery . EXAM: THORACOLUMBAR SPINE 1V COMPARISON:  08/27/2016. FINDINGS: Metallic marker noted over a mid to lower thoracic vertebral body. Thoracolumbar spine fusion. Hardware intact. Anatomic alignment stable mild lower thoracic upper lumbar compression fracture. IMPRESSION: Postsurgical changes thoracolumbar spine.  Electronically Signed   By: Maisie Fus  Register   On: 09/07/2016 06:30   Dg C-arm 1-60 Min  Result Date: 09/07/2016 CLINICAL DATA:  Thoracolumbar spine surgery. EXAM: DG C-ARM 61-120 MIN COMPARISON:  07/12/2016. FINDINGS: Metallic marker noted over the mid to lower thoracic spine. Thoracolumbar spine fusion. Hardware intact. Anatomic alignment by stable compression fracture lower thoracic upper lumbar spine . IMPRESSION: Postsurgical changes thoracolumbar spine . Electronically Signed   By: Maisie Fus  Register   On: 09/07/2016 06:31    Antibiotics:  Anti-infectives    Start     Dose/Rate Route Frequency Ordered Stop   09/06/16 2300  ceFAZolin (ANCEF) IVPB 1 g/50 mL premix     1 g 100 mL/hr over 30 Minutes Intravenous Every 8 hours 09/06/16 2015 09/08/16 1502   09/06/16 2200  valACYclovir (VALTREX) tablet 1,000 mg     1,000 mg Oral Every 12 hours 09/06/16 2015     09/06/16 1731  vancomycin (VANCOCIN) powder  Status:  Discontinued       As needed 09/06/16 1731 09/06/16 1816   09/06/16 1543  bacitracin 50,000 Units in sodium chloride irrigation 0.9 % 500 mL irrigation  Status:  Discontinued       As needed 09/06/16 1544 09/06/16 1816   09/06/16 1049  ceFAZolin (ANCEF) 2-4 GM/100ML-% IVPB    Comments:  Erin [name]   : cabinet override      09/06/16 1049 09/06/16 2259      Discharge Exam: Blood pressure 130/68, pulse 73, temperature 98 F (36.7 C), temperature source Oral, resp. rate 19, height 5\' 3"  (1.6 m), weight 77.5 kg (170 lb 12.8 oz), SpO2 95 %. Neurologic: Grossly normal Patient doing well. Ambulating well. ID did clear her. Appropriate back soreness as expected. Will follow up with her in the office in 2 weeks.  Discharge Medications:   Allergies as of 09/10/2016      Reactions   Ace Inhibitors Swelling, Other (See Comments)   ACE stopped after pt seen in ED with facial swelling- allergy testing pending   Ambien [zolpidem] Other (See Comments)   confusion   Fentanyl Nausea And  Vomiting, Other (See Comments)   PATCH      Morphine And Related Other (See Comments)   Unknown   Codeine Nausea And Vomiting      Medication List    STOP taking these medications   apixaban 5 MG Tabs tablet Commonly known as:  ELIQUIS   aspirin 81 MG EC tablet   baclofen 10 MG tablet Commonly known as:  LIORESAL   NON FORMULARY   tiZANidine 4 MG tablet Commonly known as:  ZANAFLEX   valACYclovir 1000 MG tablet Commonly known as:  VALTREX     TAKE these medications   acetaminophen 325 MG tablet Commonly known as:  TYLENOL Take 650 mg by mouth every 4 (four) hours as needed for mild pain or fever.   amiodarone 200 MG tablet Commonly known as:  PACERONE Take 1 tablet (200 mg total) by mouth daily.   atorvastatin 80 MG tablet Commonly known as:  LIPITOR Take 1 tablet (80 mg total)  by mouth daily at 6 PM.   Biotin 10 MG Caps Take 10 mg by mouth daily.   bisacodyl 10 MG suppository Commonly known as:  DULCOLAX Place 10 mg rectally as needed for moderate constipation (Give if no relief from MOM next shift).   butalbital-acetaminophen-caffeine 50-325-40 MG tablet Commonly known as:  FIORICET, ESGIC Take 1 tablet by mouth 2 (two) times daily as needed for migraine.   cyclobenzaprine 10 MG tablet Commonly known as:  FLEXERIL Take 1 tablet (10 mg total) by mouth 3 (three) times daily as needed for muscle spasms. What changed:  Another medication with the same name was added. Make sure you understand how and when to take each.   cyclobenzaprine 10 MG tablet Commonly known as:  FLEXERIL Take 1 tablet (10 mg total) by mouth 3 (three) times daily as needed for muscle spasms. What changed:  You were already taking a medication with the same name, and this prescription was added. Make sure you understand how and when to take each.   cycloSPORINE 0.05 % ophthalmic emulsion Commonly known as:  RESTASIS Place 1 drop into both eyes 2 (two) times daily.   diltiazem 60 MG  tablet Commonly known as:  CARDIZEM Take 1 tablet (60 mg total) by mouth every 6 (six) hours.   ferrous sulfate 325 (65 FE) MG tablet Take 1 tablet (325 mg total) by mouth 2 (two) times daily with a meal.   furosemide 40 MG tablet Commonly known as:  LASIX Take 40 mg by mouth daily.   levothyroxine 150 MCG tablet Commonly known as:  SYNTHROID, LEVOTHROID Take 150 mcg by mouth daily before breakfast.   magnesium chloride 64 MG Tbec SR tablet Commonly known as:  SLOW-MAG Take 2 tablets (128 mg total) by mouth daily. What changed:  how much to take   magnesium citrate Soln Take 1 Bottle by mouth as needed for severe constipation.   magnesium hydroxide 400 MG/5ML suspension Commonly known as:  MILK OF MAGNESIA Take 30 mLs by mouth at bedtime as needed for mild constipation (Give if no BM in 3 days).   MULTIVITAMIN/MINERALS PO Take 1 tablet by mouth daily.   ondansetron 4 MG tablet Commonly known as:  ZOFRAN Take 8 mg by mouth every 8 (eight) hours.   pantoprazole 40 MG tablet Commonly known as:  PROTONIX Take 1 tablet (40 mg total) by mouth at bedtime.   phenylephrine-shark liver oil-mineral oil-petrolatum 0.25-3-14-71.9 % rectal ointment Commonly known as:  PREPARATION H Place 1 application rectally as needed for hemorrhoids.   potassium chloride SA 20 MEQ tablet Commonly known as:  K-DUR,KLOR-CON Take 2 tablets (40 mEq total) by mouth daily.   senna 8.6 MG Tabs tablet Commonly known as:  SENOKOT Take 1 tablet by mouth 2 (two) times daily.   sodium phosphate 7-19 GM/118ML Enem Place 1 enema rectally as needed for severe constipation (Give if no result from Dulcolax in 2 hours).   traMADol 50 MG tablet Commonly known as:  ULTRAM Take 100 mg by mouth every 4 (four) hours as needed for moderate pain or severe pain. What changed:  Another medication with the same name was added. Make sure you understand how and when to take each.   traMADol 50 MG tablet Commonly  known as:  ULTRAM Take 2 tablets (100 mg total) by mouth every 4 (four) hours as needed for moderate pain or severe pain. What changed:  You were already taking a medication with the same name, and this  prescription was added. Make sure you understand how and when to take each.   Vitamin D 2000 units Caps Take 2,000 Units by mouth daily after breakfast.            Durable Medical Equipment        Start     Ordered   09/10/16 1604  DME Oxygen  Once    Question Answer Comment  Mode or (Route) Nasal cannula   Oxygen delivery system Gas      09/10/16 1606      Disposition: SNF   Final Dx: same as admitting  Discharge Instructions    Call MD for:    Complete by:  As directed    Call MD for:  difficulty breathing, headache or visual disturbances    Complete by:  As directed    Call MD for:  extreme fatigue    Complete by:  As directed    Call MD for:  hives    Complete by:  As directed    Call MD for:  persistant dizziness or light-headedness    Complete by:  As directed    Call MD for:  persistant nausea and vomiting    Complete by:  As directed    Call MD for:  redness, tenderness, or signs of infection (pain, swelling, redness, odor or green/yellow discharge around incision site)    Complete by:  As directed    Call MD for:  severe uncontrolled pain    Complete by:  As directed    Call MD for:  temperature >100.4    Complete by:  As directed    Diet - low sodium heart healthy    Complete by:  As directed    Driving Restrictions    Complete by:  As directed    No driving until follow up visit with Dr. Wynetta Emery   Increase activity slowly    Complete by:  As directed          Signed: Tiana Loft Jamileth Putzier 09/10/2016, 4:07 PM

## 2016-09-10 NOTE — Progress Notes (Signed)
Subjective: Patient reports mild back pain. She is doing well.  Pain is being managed with oral medication.  Objective: Vital signs in last 24 hours: Temp:  [98 F (36.7 C)-98.7 F (37.1 C)] 98 F (36.7 C) (07/23 1201) Pulse Rate:  [75-88] 84 (07/23 1201) Resp:  [20-34] 25 (07/23 1201) BP: (111-131)/(62-70) 130/68 (07/23 1201) SpO2:  [88 %-96 %] 90 % (07/23 1202) FiO2 (%):  [5 %] 5 % (07/23 1202)  Intake/Output from previous day: 07/22 0701 - 07/23 0700 In: 0  Out: 100 [Drains:100] Intake/Output this shift: Total I/O In: 120 [P.O.:120] Out: 3 [Urine:2; Stool:1]  Neurologic: Grossly normal  Lab Results: Lab Results  Component Value Date   WBC 8.1 09/05/2016   HGB 11.2 (L) 09/06/2016   HCT 33.0 (L) 09/06/2016   MCV 89.0 09/05/2016   PLT 169 09/05/2016   Lab Results  Component Value Date   INR 1.03 09/05/2016   BMET Lab Results  Component Value Date   NA 130 (L) 09/06/2016   K 3.5 09/06/2016   CL 93 (L) 09/05/2016   CO2 26 09/05/2016   GLUCOSE 81 09/06/2016   BUN 6 09/05/2016   CREATININE 0.78 09/05/2016   CALCIUM 9.1 09/05/2016    Studies/Results: No results found.  Assessment/Plan:   Hemovac removed.   Orders placed for PICC removal  Plan to discharge to [place] per CSW when bed available   LOS: 4 days    Erin [name] 09/10/2016, 2:04 PM

## 2016-09-10 NOTE — Progress Notes (Signed)
Clinical Social Worker facilitated patient discharge including contacting patient family and facility to confirm patient discharge plans.  Clinical information faxed to facility and family agreeable with plan.  CSW arranged ambulance transport via [facility] to [facility] .  RN to call [phone] for report prior to discharge.  Clinical Social Worker will sign off for now as social work intervention is no longer needed. Please consult [place] again if new need arises.  Marrianne Mood, MSW, [employer] [phone]

## 2016-09-10 NOTE — Care Management Important Message (Signed)
Important Message  Patient Details  Name: Erin [name] MRN: [id number] Date of Birth: [birth year]   Medicare Important Message Given:  Yes    [name] 09/10/2016, 9:23 AM

## 2016-09-10 NOTE — Progress Notes (Signed)
CSW is follow patient or discharge needs. CSW spoke to admission coordinator at [facility] and they are aware of patients pending discharge. Facility stated they have started a request for authorization through patients insurance and they will let CSW know once authorization has been given.  Marrianne Mood, MSW,  [employer] [phone]

## 2016-09-10 NOTE — Progress Notes (Signed)
Order received to discharge patient.  Telemetry monitor removed and CCMD notified.  PICC removed by IV team.  Report called to [facility] and [facility] called to transport pt.

## 2016-09-10 NOTE — Progress Notes (Signed)
[facility]   Reason for visit: Follow up on osteomyelitis, discitis  Interval History: culture remains without any growth, no new pain, no fever, no chills.   Physical Exam: Constitutional:  Vitals:   09/10/16 0829 09/10/16 0913  BP: 111/62   Pulse: 80   Resp: (!) 34   Temp:  98 F (36.7 C)   patient appears in NAD Respiratory: Normal respiratory effort; CTA B Cardiovascular: RRR GI: soft, nt, nd  Review of Systems: Constitutional: negative for fevers, chills, fatigue and malaise Gastrointestinal: negative for diarrhea Integument/breast: negative except for rash  Lab Results  Component Value Date   WBC 8.1 09/05/2016   HGB 11.2 (L) 09/06/2016   HCT 33.0 (L) 09/06/2016   MCV 89.0 09/05/2016   PLT 169 09/05/2016    Lab Results  Component Value Date   CREATININE 0.78 09/05/2016   BUN 6 09/05/2016   NA 130 (L) 09/06/2016   K 3.5 09/06/2016   CL 93 (L) 09/05/2016   CO2 26 09/05/2016    Lab Results  Component Value Date   ALT 3 08/14/2016   AST 12 08/14/2016   ALKPHOS 174 (H) 08/14/2016     Microbiology: Recent Results (from the past 240 hour(s))  Surgical pcr screen     Status: None   Collection Time: 09/05/16 12:08 PM  Result Value Ref Range Status   MRSA, PCR NEGATIVE NEGATIVE Final   Staphylococcus aureus NEGATIVE NEGATIVE Final    Comment:        The Xpert SA Assay (FDA approved for NASAL specimens in patients over 27 years of age), is one component of a comprehensive surveillance program.  Test performance has been validated by [facility] for patients greater than or equal to 37 year old. It is not intended to diagnose infection nor to guide or monitor treatment.   Aerobic/Anaerobic Culture (surgical/deep wound)     Status: None (Preliminary result)   Collection Time: 09/06/16  3:53 PM  Result Value Ref Range Status   Specimen Description BACK RIGHT  Final   Special Requests RIGHT LUMBAR TWO DISC SPACE POF  ANCEF  Final   Gram Stain   Final    RARE WBC PRESENT, PREDOMINANTLY MONONUCLEAR NO ORGANISMS SEEN    Culture   Final    NO GROWTH 3 DAYS NO ANAEROBES ISOLATED; CULTURE IN PROGRESS FOR 5 DAYS   Report Status PENDING  Incomplete  Aerobic/Anaerobic Culture (surgical/deep wound)     Status: None (Preliminary result)   Collection Time: 09/06/16  3:56 PM  Result Value Ref Range Status   Specimen Description BACK LEFT  Final   Special Requests LEFT L2 DISC SPACE POF ANCEF  Final   Gram Stain   Final    FEW WBC PRESENT,BOTH PMN AND MONONUCLEAR NO ORGANISMS SEEN    Culture   Final    NO GROWTH 3 DAYS NO ANAEROBES ISOLATED; CULTURE IN PROGRESS FOR 5 DAYS   Report Status PENDING  Incomplete  Aerobic/Anaerobic Culture (surgical/deep wound)     Status: None (Preliminary result)   Collection Time: 09/06/16  3:57 PM  Result Value Ref Range Status   Specimen Description WOUND  Final   Special Requests SCREWS POF ANCEF  Final   Gram Stain   Final    FEW WBC PRESENT,BOTH PMN AND MONONUCLEAR NO ORGANISMS SEEN    Culture   Final    NO GROWTH 3 DAYS NO ANAEROBES ISOLATED; CULTURE IN PROGRESS FOR 5 DAYS  Report Status PENDING  Incomplete    Impression/Plan:  1. Discitis/osteomyelitis - with no growth, will be final tomorrow.  From an ID standpoint, no indication for antibiotics.  If it remains negative ok from an ID standpoint for discharge.  2.  Access - ok from ID standpoint for picc line removal prior to discharge.    She has ID follow up already with Dr. Drue Second.   I will sign off, please call if there are any changes.

## 2016-09-10 NOTE — Progress Notes (Signed)
Progress Note  Patient Name: Erin [name] Date of Encounter: 09/10/2016  Primary Cardiologist: Eden Emms  Subjective   No CP  Breathing Ok    Inpatient Medications    Scheduled Meds: . amiodarone  200 mg Oral Daily  . aspirin EC  81 mg Oral Daily  . atorvastatin  80 mg Oral q1800  . cholecalciferol  2,000 Units Oral QPC breakfast  . cycloSPORINE  1 drop Both Eyes BID  . diltiazem  60 mg Oral Q6H  . docusate sodium  100 mg Oral BID  . feeding supplement (ENSURE ENLIVE)  237 mL Oral BID BM  . ferrous sulfate  325 mg Oral BID WC  . furosemide  40 mg Oral Daily  . levothyroxine  150 mcg Oral QAC breakfast  . ondansetron  8 mg Oral Q8H  . pantoprazole  40 mg Oral QHS  . potassium chloride SA  40 mEq Oral Daily  . senna  1 tablet Oral BID  . sodium chloride flush  10-40 mL Intracatheter Q12H  . sodium chloride flush  10-40 mL Intracatheter Q12H  . sodium chloride flush  3 mL Intravenous Q12H  . valACYclovir  1,000 mg Oral Q12H   Continuous Infusions: . sodium chloride Stopped (09/06/16 2030)   PRN Meds: acetaminophen **OR** acetaminophen, alum & mag hydroxide-simeth, bisacodyl, bisacodyl, butalbital-acetaminophen-caffeine, cyclobenzaprine, HYDROmorphone (DILAUDID) injection, magnesium hydroxide, menthol-cetylpyridinium **OR** phenol, ondansetron **OR** ondansetron (ZOFRAN) IV, senna-docusate, sodium chloride flush, sodium chloride flush, sodium chloride flush, sodium phosphate, traMADol   Vital Signs    Vitals:   09/10/16 0032 09/10/16 0436 09/10/16 0829 09/10/16 0913  BP: 116/70 128/69 111/62   Pulse: 88 85 80   Resp: 20 20 (!) 34   Temp: 98.4 F (36.9 C) 98.7 F (37.1 C)  98 F (36.7 C)  TempSrc: Oral Oral    SpO2: 94% 94% 94%   Weight:      Height:        Intake/Output Summary (Last 24 hours) at 09/10/16 1130 Last data filed at 09/10/16 1100  Gross per 24 hour  Intake              120 ml  Output               53 ml  Net               67 ml   Filed Weights    09/06/16 1019 09/07/16 1000 09/09/16 0430  Weight: 159 lb (72.1 kg) 159 lb (72.1 kg) 170 lb 12.8 oz (77.5 kg)    Telemetry    SR- Personally Reviewed  ECG      Physical Exam   GEN: No acute distress.   Neck: No JVD Cardiac: RRR, no murmurs, rubs, or gallops.  Respiratory: Clear to auscultation bilaterally. GI: Soft, nontender, non-distended  MS: No edema; No deformity. Neuro:  Nonfocal  Psych: Normal affect   Labs    Chemistry Recent Labs Lab 09/05/16 1208 09/06/16 1038  NA 127* 130*  K 3.7 3.5  CL 93*  --   CO2 26  --   GLUCOSE 100* 81  BUN 6  --   CREATININE 0.78  --   CALCIUM 9.1  --   GFRNONAA >60  --   GFRAA >60  --   ANIONGAP 8  --      Hematology Recent Labs Lab 09/05/16 1208 09/06/16 1038  WBC 8.1  --   RBC 3.56*  --   HGB 10.5* 11.2*  HCT 31.7* 33.0*  MCV 89.0  --   MCH 29.5  --   MCHC 33.1  --   RDW 18.3*  --   PLT 169  --     Cardiac EnzymesNo results for input(s): TROPONINI in the last 168 hours. No results for input(s): TROPIPOC in the last 168 hours.   BNPNo results for input(s): BNP, PROBNP in the last 168 hours.   DDimer No results for input(s): DDIMER in the last 168 hours.   Radiology    No results found.  Cardiac Studies     Patient Profile     71 y.o. female  Hx of CAD and atrial fib  (paroxysmal)  Now post op fromback surgery     Assessment & Plan    1  PAF  Pt remains in SR   Off of Eliquis now but should be resumed when OK from surgery standpoint.  Stop ASA at that time    2  CAD  No symptoms to sugg angina.    Signed, Dietrich Pates, MD  09/10/2016, 11:30 AM

## 2016-09-10 NOTE — Progress Notes (Signed)
Physical Therapy Treatment Patient Details Name: Erin [name] MRN: [id number] DOB: 1945-03-26 Today's Date: 09/10/2016    History of Present Illness Pt is a 71 yo female s/p T10-L3 revision and removal of hardware from L2-S1 09/06/16. PMH of CAD/CABG and PAF. She has had recent CABG by Dr Dorris Fetch 05/30/16 with LAA clipping. Recent course complicated by Ecoli sepsis and paravertebral abscess post lumbar laminectomy in March with revision by Dr Wynetta Emery. 06/26/16.  She has had recurrent PAF - back on Eliquis PAF as well as loaded with amiodarone and has had TEE cardioversion 07/04/16 and there was thrombus in her LAA but clip was intact with no communication to her LA and no other thrombus. She went back into rapid atrial flutter with rates in the 120s. She was symptomatic with palpitations. She required repeat TEE cardioversion 07/2016 back to NSR. EF has dropped to 40-45% with moderate MR    PT Comments    Pt is making good progress towards her goals. Pt currently min guard for transfers and supervision for ambulation of 100 feet with RW. Pt ambulation limited by oxygen desaturation on RA. Pt requires skilled PT to continue to progress gait training and to improve LE strength and endurance to safely mobilize in her discharge environment.    Follow Up Recommendations  SNF     Equipment Recommendations  Other (comment)    Recommendations for Other Services       Precautions / Restrictions Precautions Precautions: Back Precaution Booklet Issued: No Precaution Comments: pt able to recall 3/3 back precautions  Required Braces or Orthoses: Spinal Brace Spinal Brace: Applied in sitting position Restrictions Weight Bearing Restrictions: No    Mobility  Bed Mobility               General bed mobility comments: OOB in recliner at entry  Transfers Overall transfer level: Needs assistance Equipment used: Rolling walker (2 wheeled);None Transfers: Sit to/from Stand Sit to Stand: Min  guard            Ambulation/Gait Ambulation/Gait assistance: Supervision Ambulation Distance (Feet): 100 Feet Assistive device: Rolling walker (2 wheeled) Gait Pattern/deviations: Decreased stride length;Step-through pattern Gait velocity: decreased Gait velocity interpretation: Below normal speed for age/gender General Gait Details: supervision for safety, slow,steady cadence vc for looking up and out         Balance Overall balance assessment: Needs assistance Sitting-balance support: No upper extremity supported;Feet supported Sitting balance-Leahy Scale: Good     Standing balance support: No upper extremity supported Standing balance-Leahy Scale: Good Standing balance comment: able to come to standing before reaching for walker                            Cognition Arousal/Alertness: Awake/alert Behavior During Therapy: WFL for tasks assessed/performed Overall Cognitive Status: Within Functional Limits for tasks assessed                                           General Comments General comments (skin integrity, edema, etc.): SaO2 on 1 L O2 via nasal cannula 98%O2, O2 removed for ambulation, SaO2 dropped to 83%O2 with ambulation and 1L O2 via nasal cannula replaced in seated, SaO2 rebounded to 92%O2      Pertinent Vitals/Pain Pain Assessment: 0-10 Pain Score: 5  Pain Location: back  Pain Descriptors / Indicators: Aching;Constant Pain Intervention(s): Monitored  during session;Limited activity within patient's tolerance           PT Goals (current goals can now be found in the care plan section) Acute Rehab PT Goals Patient Stated Goal: to get back to normal  PT Goal Formulation: With patient Time For Goal Achievement: 09/14/16 Potential to Achieve Goals: Good Progress towards PT goals: Progressing toward goals    Frequency    Min 2X/week      PT Plan Current plan remains appropriate       AM-PAC PT "6 Clicks" Daily  Activity  Outcome Measure  Difficulty turning over in bed (including adjusting bedclothes, sheets and blankets)?: A Little Difficulty moving from lying on back to sitting on the side of the bed? : A Little Difficulty sitting down on and standing up from a chair with arms (e.g., wheelchair, bedside commode, etc,.)?: A Little Help needed moving to and from a bed to chair (including a wheelchair)?: A Little Help needed walking in hospital room?: A Little Help needed climbing 3-5 steps with a railing? : A Lot 6 Click Score: 17    End of Session Equipment Utilized During Treatment: Gait belt Activity Tolerance: Patient tolerated treatment well Patient left: in chair;with call bell/phone within reach Nurse Communication: Need for lift equipment PT Visit Diagnosis: Unsteadiness on feet (R26.81);Other abnormalities of gait and mobility (R26.89);Muscle weakness (generalized) (M62.81);Pain Pain - part of body:  (back)     Time: 5681-2751 PT Time Calculation (min) (ACUTE ONLY): 13 min  Charges:  $Gait Training: 8-22 mins                    G Codes:       Hanne Kegg B. Beverely Risen PT, DPT Acute Rehabilitation  [phone] Pager [phone]     Elon Alas Fleet 09/10/2016, 4:42 PM

## 2016-09-11 LAB — AEROBIC/ANAEROBIC CULTURE W GRAM STAIN (SURGICAL/DEEP WOUND)
Culture: NO GROWTH
Culture: NO GROWTH

## 2016-09-11 LAB — AEROBIC/ANAEROBIC CULTURE (SURGICAL/DEEP WOUND): Culture: NO GROWTH

## 2016-09-28 ENCOUNTER — Ambulatory Visit: Payer: Medicare HMO | Admitting: Cardiovascular Disease

## 2016-10-02 ENCOUNTER — Ambulatory Visit (INDEPENDENT_AMBULATORY_CARE_PROVIDER_SITE_OTHER): Payer: Medicare HMO | Admitting: Nurse Practitioner

## 2016-10-02 ENCOUNTER — Encounter: Payer: Self-pay | Admitting: Nurse Practitioner

## 2016-10-02 VITALS — BP 150/78 | HR 87 | Ht 63.0 in | Wt 150.0 lb

## 2016-10-02 DIAGNOSIS — I5022 Chronic systolic (congestive) heart failure: Secondary | ICD-10-CM | POA: Diagnosis not present

## 2016-10-02 DIAGNOSIS — I259 Chronic ischemic heart disease, unspecified: Secondary | ICD-10-CM | POA: Diagnosis not present

## 2016-10-02 DIAGNOSIS — I480 Paroxysmal atrial fibrillation: Secondary | ICD-10-CM

## 2016-10-02 DIAGNOSIS — Z9229 Personal history of other drug therapy: Secondary | ICD-10-CM

## 2016-10-02 DIAGNOSIS — Z7901 Long term (current) use of anticoagulants: Secondary | ICD-10-CM

## 2016-10-02 DIAGNOSIS — Z79899 Other long term (current) drug therapy: Secondary | ICD-10-CM | POA: Diagnosis not present

## 2016-10-02 LAB — BASIC METABOLIC PANEL
BUN/Creatinine Ratio: 10 — ABNORMAL LOW (ref 12–28)
BUN: 10 mg/dL (ref 8–27)
CO2: 24 mmol/L (ref 20–29)
Calcium: 9.4 mg/dL (ref 8.7–10.3)
Chloride: 101 mmol/L (ref 96–106)
Creatinine, Ser: 1.01 mg/dL — ABNORMAL HIGH (ref 0.57–1.00)
GFR calc Af Amer: 64 mL/min/{1.73_m2} (ref >59)
GFR calc non Af Amer: 55 mL/min/{1.73_m2} — ABNORMAL LOW (ref >59)
Glucose: 92 mg/dL (ref 65–99)
Potassium: 4 mmol/L (ref 3.5–5.2)
Sodium: 140 mmol/L (ref 134–144)

## 2016-10-02 LAB — CBC
Hematocrit: 30.7 % — ABNORMAL LOW (ref 34.0–46.6)
Hemoglobin: 10.3 g/dL — ABNORMAL LOW (ref 11.1–15.9)
MCH: 29.7 pg (ref 26.6–33.0)
MCHC: 33.6 g/dL (ref 31.5–35.7)
MCV: 89 fL (ref 79–97)
Platelets: 257 10*3/uL (ref 150–379)
RBC: 3.47 x10E6/uL — ABNORMAL LOW (ref 3.77–5.28)
RDW: 17.1 % — ABNORMAL HIGH (ref 12.3–15.4)
WBC: 7.8 10*3/uL (ref 3.4–10.8)

## 2016-10-02 LAB — TSH: TSH: 4.01 u[IU]/mL (ref 0.450–4.500)

## 2016-10-02 LAB — HEPATIC FUNCTION PANEL
ALT: 16 IU/L (ref 0–32)
AST: 25 IU/L (ref 0–40)
Albumin: 3.4 g/dL — ABNORMAL LOW (ref 3.5–4.8)
Alkaline Phosphatase: 193 IU/L — ABNORMAL HIGH (ref 39–117)
Bilirubin Total: 0.2 mg/dL (ref 0.0–1.2)
Bilirubin, Direct: 0.12 mg/dL (ref 0.00–0.40)
Total Protein: 6 g/dL (ref 6.0–8.5)

## 2016-10-02 LAB — PRO B NATRIURETIC PEPTIDE: NT-Pro BNP: 846 pg/mL — ABNORMAL HIGH (ref 0–301)

## 2016-10-02 MED ORDER — APIXABAN 5 MG PO TABS: 5.0000 mg | ORAL_TABLET | Freq: Two times a day (BID) | ORAL | 11 refills | Status: DC

## 2016-10-02 NOTE — Progress Notes (Signed)
CARDIOLOGY OFFICE NOTE  Date:  10/02/2016    Erin [name] Date of Birth: 05/23/1945 Medical Record #[id number]  PCP:  Caffie Damme, MD  Cardiologist:  Townsend Roger   Chief Complaint  Patient presents with  . Atrial Fibrillation  . Coronary Artery Disease    Post hospital visit - seen for Dr. Eden Emms    History of Present Illness: Erin [name] is a 71 y.o. female who presents today for a follow up visit. Seen for Dr. Eden Emms.  Previously seen by Dr Tresa Endo. Has asked to follow with Dr. Eden Emms going forward who was agreeable. She had had a complicated hospitalization back in June 2018 with complications from spine surgery and PAF and was followed by Dr. Eden Emms.   She has a hx of CAD/CABG and PAF. She has had recent CABG by Dr Dorris Fetch 05/30/16 with LAA clipping. Recent course complicated by Ecoli sepsis and paravertebral abscess post lumbar laminectomy in March with revision by Dr Wynetta Emery 06/26/16. She has had recurrent PAF - back on Eliquis and was loaded with amiodarone and has had TEE cardioversion 07/04/16 and there was thrombus in her LAA but clip was intact with no communication to her LA and no other thrombus. She went back into rapid atrial flutter with rates in the 120s. She was symptomatic with palpitations. She required repeat TEE cardioversion 07/2016 back to NSR. EF has dropped to 40-45% with moderate MR.  She has been in a back brace and has some LE weakness from surgery and ? Iliopsoas inflammation from infection CT 6/4 with no retroperitoneal hematoma.   I saw her back in the late part of June with multiple somatic complaints and slow rehab. Amiodarone dose lowered. Synthroid dose increased for TSH 25.  Antibiotics stopped July 4th. Concern for needing additional surgery with Dr Wynetta Emery noted.    Saw Dr. Eden Emms back in July - seemed to be making progress - was to come back in 3 months.   Readmitted towards the end of July by Dr. Wynetta Emery - she underwent "exploration of fusion  removal of hardware L2-S1 with removal of bilateral loose L2 pedicle screws which were sent for culture and removal of rods and all top tightening nuts and cross-link". Tolerated ok. Followed by cardiology. No apparent issues noted.   Comes in today. Here with her son today. She is walking today with the help of a walker. Looks so much better and stronger. Says she "got kicked out of rehab" - insurance ran out. Tried to get her to assisted living but then got pneumonia and they would not take her - so her son has taken the last 2 weeks off to help her. Her aspirin was stopped during this last admission. Her Eliquis has NOT been restarted - noted from Dr. Tenny Craw on 09/10/16 "Off of Eliquis now but should be resumed when OK from surgery standpoint.  Stop ASA at that time". She was discharged on 09/10/16. She is up walking and going up some steps. Little cough - this improved. Her sats were in the mid 80's - son has gotten her on oxygen at night (he owns an oxygen company). Now sats up in the 90's. Her weight is way below her preop weight from April. Little swelling in her feet. She is walking more. No chest pain. Breathing is improving as well. Overall, seems to be making progress.   Past Medical History:  Diagnosis Date  . A-fib (HCC)   . Anxiety   .  Arthritis    "all my back is eat up w/it; knees too" (05/24/2016)  . CAD (coronary artery disease)    s/p CABG  . Chronic bronchitis (HCC)   . Chronic lower back pain   . Depression   . Dyspnea    "since OR 04/2016" (05/24/2016)  . Dysrhythmia   . Family history of adverse reaction to anesthesia    "daughter gets PONV" (05/24/2016)  . GERD (gastroesophageal reflux disease)    occ  . High cholesterol   . History of stomach ulcers   . Hypertension   . Hypothyroidism   . Migraine    "usually have one monthly; nothing since 04/25/2016)  . Pneumonia ~ 2002    Past Surgical History:  Procedure Laterality Date  . ANTERIOR CERVICAL DECOMP/DISCECTOMY FUSION  ~  2009  . BACK SURGERY    . BACK SURGERY     JUNE  2018  . BLEPHAROPLASTY    . CARDIOVERSION N/A 07/04/2016   Procedure: CARDIOVERSION;  Surgeon: Wendall Stade, MD;  Location: [facility] ENDOSCOPY;  Service: Cardiovascular;  Laterality: N/A;  . CARDIOVERSION N/A 07/30/2016   Procedure: CARDIOVERSION;  Surgeon: Wendall Stade, MD;  Location: [facility] ENDOSCOPY;  Service: Cardiovascular;  Laterality: N/A;  . CARPAL TUNNEL RELEASE Bilateral 80's  . CATARACT EXTRACTION, BILATERAL    . CORONARY ARTERY BYPASS GRAFT N/A 05/30/2016   Procedure: CORONARY ARTERY BYPASS GRAFTING (CABG), ON PUMP, TIMES FOUR, USING LEFT INTERNAL MAMMARY ARTERY AND ENDOSCOPICALLY HARVESTED BILATERAL GREATER SAPHENOUS VEINS WITH CLIPPING OF LEFT ATRIAL APPENDAGE;  Surgeon: Loreli Slot, MD;  Location: [facility] OR;  Service: Open Heart Surgery;  Laterality: N/A;  LIMA to LAD, SVG to RAMUS INTERMEDIATE, and SVG SEQUENTIALLY to CIRCUMFLEX and PDA  . IR FLUORO GUIDED NEEDLE PLC ASPIRATION/INJECTION LOC  07/12/2016  . KNEE ARTHROSCOPY Bilateral 2000s  . LEFT HEART CATH AND CORONARY ANGIOGRAPHY N/A 05/29/2016   Procedure: Left Heart Cath and Coronary Angiography;  Surgeon: Lennette Bihari, MD;  Location: [facility] INVASIVE CV LAB;  Service: Cardiovascular;  Laterality: N/A;  . LUMBAR DISC SURGERY  2006; 07/2005  . LUMBAR WOUND DEBRIDEMENT N/A 06/26/2016   Procedure: Incision and Drainage of LUMBAR WOUND;  Surgeon: Donalee Citrin, MD;  Location: [facility] OR;  Service: Neurosurgery;  Laterality: N/A;  . POSTERIOR LUMBAR FUSION  04/2016  . TEE WITHOUT CARDIOVERSION N/A 07/04/2016   Procedure: TRANSESOPHAGEAL ECHOCARDIOGRAM (TEE);  Surgeon: Wendall Stade, MD;  Location: [facility] ENDOSCOPY;  Service: Cardiovascular;  Laterality: N/A;  . TEE WITHOUT CARDIOVERSION N/A 07/30/2016   Procedure: TRANSESOPHAGEAL ECHOCARDIOGRAM (TEE);  Surgeon: Wendall Stade, MD;  Location: [facility] ENDOSCOPY;  Service: Cardiovascular;  Laterality: N/A;  . TONSILLECTOMY    . TUBAL LIGATION        Medications: Current Meds  Medication Sig  . acetaminophen (TYLENOL) 325 MG tablet Take 650 mg by mouth every 4 (four) hours as needed for mild pain or fever.  Marland Kitchen amiodarone (PACERONE) 200 MG tablet Take 1 tablet (200 mg total) by mouth daily.  Marland Kitchen atorvastatin (LIPITOR) 80 MG tablet Take 1 tablet (80 mg total) by mouth daily at 6 PM.  . Biotin 10 MG CAPS Take 10 mg by mouth daily.  . bisacodyl (DULCOLAX) 10 MG suppository Place 10 mg rectally as needed for moderate constipation (Give if no relief from MOM next shift).   . butalbital-acetaminophen-caffeine (FIORICET, ESGIC) 50-325-40 MG tablet Take 1 tablet by mouth 2 (two) times daily as needed for migraine.  . cefdinir (OMNICEF) 300 MG capsule  Take 300 mg by mouth 2 (two) times daily.  . Cholecalciferol (VITAMIN D) 2000 units CAPS Take 2,000 Units by mouth daily after breakfast.  . cyclobenzaprine (FLEXERIL) 10 MG tablet Take 1 tablet (10 mg total) by mouth 3 (three) times daily as needed for muscle spasms.  . cycloSPORINE (RESTASIS) 0.05 % ophthalmic emulsion Place 1 drop into both eyes 2 (two) times daily.  Marland Kitchen diltiazem (CARDIZEM) 60 MG tablet Take 1 tablet (60 mg total) by mouth every 6 (six) hours.  . furosemide (LASIX) 40 MG tablet Take 40 mg by mouth daily.  Marland Kitchen levothyroxine (SYNTHROID, LEVOTHROID) 150 MCG tablet Take 150 mcg by mouth daily before breakfast.   . magnesium chloride (SLOW-MAG) 64 MG TBEC SR tablet Take 2 tablets (128 mg total) by mouth daily. (Patient taking differently: Take 1 tablet by mouth daily. )  . ondansetron (ZOFRAN) 4 MG tablet Take 8 mg by mouth every 8 (eight) hours.  . pantoprazole (PROTONIX) 40 MG tablet Take 1 tablet (40 mg total) by mouth at bedtime.  . potassium chloride SA (K-DUR,KLOR-CON) 20 MEQ tablet Take 2 tablets (40 mEq total) by mouth daily.  . Prenat-FeCbn-FeBisg-FA-Omega (MULTIVITAMIN/MINERALS PO) Take 1 tablet by mouth daily.  Marland Kitchen senna (SENOKOT) 8.6 MG TABS tablet Take 1 tablet by mouth 2  (two) times daily.  . traMADol (ULTRAM) 50 MG tablet Take 100 mg by mouth every 4 (four) hours as needed for moderate pain or severe pain.   . [DISCONTINUED] cyclobenzaprine (FLEXERIL) 10 MG tablet Take 1 tablet (10 mg total) by mouth 3 (three) times daily as needed for muscle spasms.     Allergies: Allergies  Allergen Reactions  . Ace Inhibitors Swelling and Other (See Comments)    ACE stopped after pt seen in ED with facial swelling- allergy testing pending  . Ambien [Zolpidem] Other (See Comments)    confusion  . Fentanyl Nausea And Vomiting and Other (See Comments)    PATCH     . Morphine And Related Other (See Comments)    Unknown  . Codeine Nausea And Vomiting    Social History: The patient  reports that she has never smoked. She has never used smokeless tobacco. She reports that she does not drink alcohol or use drugs.   Family History: The patient's family history includes CAD in her sister and son.   Review of Systems: Please see the history of present illness.   Otherwise, the review of systems is positive for none.   All other systems are reviewed and negative.   Physical Exam: VS:  BP (!) 150/78 (BP Location: Left Arm, Patient Position: Sitting, Cuff Size: Normal)   Pulse 87   Ht 5\' 3"  (1.6 m)   Wt 150 lb (68 kg)   LMP  (LMP Unknown)   BMI 26.57 kg/m  .  BMI Body mass index is 26.57 kg/m.  Wt Readings from Last 3 Encounters:  10/02/16 150 lb (68 kg)  09/09/16 170 lb 12.8 oz (77.5 kg)  09/05/16 159 lb 9.6 oz (72.4 kg)   Recheck BP by me is 130/80  General: Pleasant. She looks much stronger. She is alert and in no acute distress.   HEENT: Normal.  Neck: Supple, no JVD, carotid bruits, or masses noted.  Cardiac: Regular rate and rhythm. May have soft S3. Trace edema. Has had vein harvesting noted.   Respiratory:  Lungs are fairly clear to auscultation bilaterally with normal work of breathing after coughing.  GI: Soft and nontender.  MS: No  deformity or  atrophy. Gait and ROM intact. She is using a walker.  Skin: Warm and dry. Color is normal.  Neuro:  Strength and sensation are intact and no gross focal deficits noted.  Psych: Alert, appropriate and with normal affect.   LABORATORY DATA:  EKG:  EKG is ordered today. This demonstrates NSR today - reviewed with Dr. Elease Hashimoto.    Lab Results  Component Value Date   WBC 8.1 09/05/2016   HGB 11.2 (L) 09/06/2016   HCT 33.0 (L) 09/06/2016   PLT 169 09/05/2016   GLUCOSE 81 09/06/2016   ALT 3 08/14/2016   AST 12 08/14/2016   NA 130 (L) 09/06/2016   K 3.5 09/06/2016   CL 93 (L) 09/05/2016   CREATININE 0.78 09/05/2016   BUN 6 09/05/2016   CO2 26 09/05/2016   TSH 13.450 (H) 08/27/2016   INR 1.03 09/05/2016   HGBA1C 5.9 (H) 05/30/2016     BNP (last 3 results)  Recent Labs  07/28/16 0519  BNP 371.5*    ProBNP (last 3 results)  Recent Labs  08/14/16 1524 08/27/16 1136  PROBNP 1,595* 2,762*     Other Studies Reviewed Today:  Echo Study Conclusions 07/2016  - Left ventricle: Systolic function was mildly to moderately reduced. The estimated ejection fraction was in the range of 40% to 45%. Diffuse hypokinesis. No evidence of thrombus. - Ventricular septum: Septal motion showed abnormal function and dyssynergy. - Mitral valve: There was mild regurgitation. - Left atrium: LAA clip intact with thrombus distal and no connection to LA by 2D or color flow. The atrium was dilated. No evidence of thrombus in the atrial cavity or appendage. - Right atrium: The atrium was mildly dilated. - Atrial septum: There was increased thickness of the septum, consistent with lipomatous hypertrophy. No defect or patent foramen ovale was identified. - Tricuspid valve: There was moderate regurgitation. - Impressions: DCC: Propofol Anesthesia Dr Leanord Hawking Single 120 J biphasic shock Converted from atrial flutter rate 122 to NSR rate 78 with PAC;s No immediate neurologic  sequelae On Rx Eliquis On iv amiodarone with additional 150 mg bolus given due to frequent PAC;s  Impressions:  - DCC: Propofol Anesthesia Dr Leanord Hawking Single 120 J biphasic shock Converted from atrial flutter rate 122 to NSR rate 78 with PAC;s No immediate neurologic sequelae On Rx Eliquis On iv amiodarone with additional 150 mg bolus given due to frequent PAC;s Successful cardioversion. No cardiac source of emboli was indentified.  Assessment/Plan:  1. PAF - Maintaining NSR on lower dose amiodarone  She remains off Eliquis. CHADSVAC of at least 5 - discussed with Dr. Elease Hashimoto here in the office - restarting Eliquis 5 mg BID. Will hold on the baby aspirin for now. Lab today. Would change to long acting CCB when current supply runs out.   2. Chronic systolic HF - weight is way down. Mild swelling - advised compression hose. Rechecking lab today. Consider rechecking echo on return.    3. CAD - recent CABG - no active chest pain.   4. Recent sepsis/epidural abscess - antibiotics finished as of July 4th.  Seeing ID later this week.   6. Hypothyroidism - f/u labs - she is on replacement   7. Recent spine surgery - tolerated well.   8. Recent pneumonia - improving - remains on antibiotics.   Overall, she does look much better today. Continue with her current regimen.   Current medicines are reviewed with the patient today.  The patient does not have  concerns regarding medicines other than what has been noted above.  The following changes have been made:  See above.  Labs/ tests ordered today include:    Orders Placed This Encounter  Procedures  . Basic metabolic panel  . CBC  . Hepatic function panel  . TSH  . Pro b natriuretic peptide (BNP)  . EKG 12-Lead     Disposition:   FU with Dr. Eden Emms as planned in October. I will be happy to see back as needed.   Patient is agreeable to this plan and will call if any problems develop in the interim.    SignedNorma Fredrickson, NP  10/02/2016 10:52 AM  [facility] [address] [place], [place]  [address] Phone: [phone] Fax: [phone]

## 2016-10-02 NOTE — Patient Instructions (Addendum)
We will be checking the following labs today - BMEt, CBC, HPF, TSH and BNP   Medication Instructions:    Continue with your current medicines. BUT  I am restarting your Eliquis 5 mg to take twice a day - samples given today - RX sent to your pharmacy    Testing/Procedures To Be Arranged:  N/A  Follow-Up:   See Dr. Eden Emms as planned.     Other Special Instructions:   Knee high support stockings    If you need a refill on your cardiac medications before your next appointment, please call your pharmacy.   Call the [facility] office at [phone] if you have any questions, problems or concerns.

## 2016-10-04 ENCOUNTER — Ambulatory Visit: Payer: Medicare HMO | Admitting: Internal Medicine

## 2016-10-11 NOTE — Addendum Note (Signed)
Addendum  created 10/11/16 1127 by Kenechukwu Eckstein, MD   Sign clinical note    

## 2016-11-16 ENCOUNTER — Encounter: Payer: Self-pay | Admitting: Cardiovascular Disease

## 2016-11-27 NOTE — Progress Notes (Signed)
CARDIOLOGY OFFICE NOTE  Date:  11/28/2016    Erin [name] Date of Birth: 14-Aug-1945 Medical Record #[id number]  PCP:  Caffie Damme, MD  Cardiologist:  Townsend Roger   No chief complaint on file.   History of Present Illness: Erin [name] is a 71 y.o. female who presents today for a follow up visit. Previously seen by Dr Tresa Endo. Has asked to follow with me going forward. She had had a complicated hospitalization back in June 2018 with complications from spine surgery and PAF and was followed by me  She has a hx of CAD/CABG and PAF. She has had CABG by Dr Dorris Fetch 05/30/16 with LAA clipping.  Course complicated by Ecoli sepsis and paravertebral abscess post lumbar laminectomy in March with revision by Dr Wynetta Emery 06/26/16. She has had recurrent PAF - back on Eliquis and was loaded with amiodarone and has had TEE cardioversion 07/04/16 and there was thrombus in her LAA but clip was intact with no communication to her LA and no other thrombus. She went back into rapid atrial flutter with rates in the 120s. She was symptomatic with palpitations. She required repeat TEE cardioversion 07/2016 back to NSR. EF has dropped to 40-45% with moderate MR.  She has been in a back brace and has some LE weakness from surgery and ? Iliopsoas inflammation from infection CT 6/4 with no retroperitoneal hematoma.   Readmitted towards the end of July by Dr. Wynetta Emery - she underwent "exploration of fusion removal of hardware L2-S1 with removal of bilateral loose L2 pedicle screws which were sent for culture and removal of rods and all top tightening nuts and cross-link". Tolerated ok. Followed by cardiology. No apparent issues noted.   Doing well - walking with cane   Past Medical History:  Diagnosis Date  . A-fib (HCC)   . Anxiety   . Arthritis    "all my back is eat up w/it; knees too" (05/24/2016)  . CAD (coronary artery disease)    s/p CABG  . Chronic bronchitis (HCC)   . Chronic lower back pain   .  Depression   . Dyspnea    "since OR 04/2016" (05/24/2016)  . Dysrhythmia   . Family history of adverse reaction to anesthesia    "daughter gets PONV" (05/24/2016)  . GERD (gastroesophageal reflux disease)    occ  . High cholesterol   . History of stomach ulcers   . Hypertension   . Hypothyroidism   . Migraine    "usually have one monthly; nothing since 04/25/2016)  . Pneumonia ~ 2002    Past Surgical History:  Procedure Laterality Date  . ANTERIOR CERVICAL DECOMP/DISCECTOMY FUSION  ~ 2009  . BACK SURGERY    . BACK SURGERY     JUNE  2018  . BLEPHAROPLASTY    . CARDIOVERSION N/A 07/04/2016   Procedure: CARDIOVERSION;  Surgeon: Wendall Stade, MD;  Location: [facility] ENDOSCOPY;  Service: Cardiovascular;  Laterality: N/A;  . CARDIOVERSION N/A 07/30/2016   Procedure: CARDIOVERSION;  Surgeon: Wendall Stade, MD;  Location: [facility] ENDOSCOPY;  Service: Cardiovascular;  Laterality: N/A;  . CARPAL TUNNEL RELEASE Bilateral 80's  . CATARACT EXTRACTION, BILATERAL    . CORONARY ARTERY BYPASS GRAFT N/A 05/30/2016   Procedure: CORONARY ARTERY BYPASS GRAFTING (CABG), ON PUMP, TIMES FOUR, USING LEFT INTERNAL MAMMARY ARTERY AND ENDOSCOPICALLY HARVESTED BILATERAL GREATER SAPHENOUS VEINS WITH CLIPPING OF LEFT ATRIAL APPENDAGE;  Surgeon: Loreli Slot, MD;  Location: [facility] OR;  Service: Open Heart  Surgery;  Laterality: N/A;  LIMA to LAD, SVG to RAMUS INTERMEDIATE, and SVG SEQUENTIALLY to CIRCUMFLEX and PDA  . IR FLUORO GUIDED NEEDLE PLC ASPIRATION/INJECTION LOC  07/12/2016  . KNEE ARTHROSCOPY Bilateral 2000s  . LEFT HEART CATH AND CORONARY ANGIOGRAPHY N/A 05/29/2016   Procedure: Left Heart Cath and Coronary Angiography;  Surgeon: Lennette Bihari, MD;  Location: [facility] INVASIVE CV LAB;  Service: Cardiovascular;  Laterality: N/A;  . LUMBAR DISC SURGERY  2006; 07/2005  . LUMBAR WOUND DEBRIDEMENT N/A 06/26/2016   Procedure: Incision and Drainage of LUMBAR WOUND;  Surgeon: Donalee Citrin, MD;  Location: [facility] OR;  Service:  Neurosurgery;  Laterality: N/A;  . POSTERIOR LUMBAR FUSION  04/2016  . TEE WITHOUT CARDIOVERSION N/A 07/04/2016   Procedure: TRANSESOPHAGEAL ECHOCARDIOGRAM (TEE);  Surgeon: Wendall Stade, MD;  Location: [facility] ENDOSCOPY;  Service: Cardiovascular;  Laterality: N/A;  . TEE WITHOUT CARDIOVERSION N/A 07/30/2016   Procedure: TRANSESOPHAGEAL ECHOCARDIOGRAM (TEE);  Surgeon: Wendall Stade, MD;  Location: [facility] ENDOSCOPY;  Service: Cardiovascular;  Laterality: N/A;  . TONSILLECTOMY    . TUBAL LIGATION       Medications: Current Meds  Medication Sig  . acetaminophen (TYLENOL) 325 MG tablet Take 650 mg by mouth every 4 (four) hours as needed for mild pain or fever.  Marland Kitchen amiodarone (PACERONE) 200 MG tablet Take 1 tablet (200 mg total) by mouth daily.  Marland Kitchen apixaban (ELIQUIS) 5 MG TABS tablet Take 1 tablet (5 mg total) by mouth 2 (two) times daily.  Marland Kitchen atorvastatin (LIPITOR) 80 MG tablet Take 1 tablet (80 mg total) by mouth daily at 6 PM.  . Biotin 10 MG CAPS Take 10 mg by mouth daily.  . bisacodyl (DULCOLAX) 10 MG suppository Place 10 mg rectally as needed for moderate constipation (Give if no relief from MOM next shift).   . butalbital-acetaminophen-caffeine (FIORICET, ESGIC) 50-325-40 MG tablet Take 1 tablet by mouth 2 (two) times daily as needed for migraine.  . cefdinir (OMNICEF) 300 MG capsule Take 300 mg by mouth 2 (two) times daily.  . Cholecalciferol (VITAMIN D) 2000 units CAPS Take 2,000 Units by mouth daily after breakfast.  . cyclobenzaprine (FLEXERIL) 10 MG tablet Take 1 tablet (10 mg total) by mouth 3 (three) times daily as needed for muscle spasms.  . cycloSPORINE (RESTASIS) 0.05 % ophthalmic emulsion Place 1 drop into both eyes 2 (two) times daily.  Marland Kitchen diltiazem (CARDIZEM) 60 MG tablet Take 1 tablet (60 mg total) by mouth every 6 (six) hours.  . furosemide (LASIX) 40 MG tablet Take 40 mg by mouth daily.  Marland Kitchen levothyroxine (SYNTHROID, LEVOTHROID) 150 MCG tablet Take 150 mcg by mouth daily before  breakfast.   . magnesium chloride (SLOW-MAG) 64 MG TBEC SR tablet Take 2 tablets (128 mg total) by mouth daily. (Patient taking differently: Take 1 tablet by mouth daily. )  . ondansetron (ZOFRAN) 4 MG tablet Take 8 mg by mouth every 8 (eight) hours.  . pantoprazole (PROTONIX) 40 MG tablet Take 1 tablet (40 mg total) by mouth at bedtime.  . potassium chloride SA (K-DUR,KLOR-CON) 20 MEQ tablet Take 2 tablets (40 mEq total) by mouth daily.  . Prenat-FeCbn-FeBisg-FA-Omega (MULTIVITAMIN/MINERALS PO) Take 1 tablet by mouth daily.  Marland Kitchen senna (SENOKOT) 8.6 MG TABS tablet Take 1 tablet by mouth 2 (two) times daily.  . traMADol (ULTRAM) 50 MG tablet Take 100 mg by mouth every 4 (four) hours as needed for moderate pain or severe pain.      Allergies: Allergies  Allergen  Reactions  . Ace Inhibitors Swelling and Other (See Comments)    ACE stopped after pt seen in ED with facial swelling- allergy testing pending  . Ambien [Zolpidem] Other (See Comments)    confusion  . Fentanyl Nausea And Vomiting and Other (See Comments)    PATCH     . Morphine And Related Other (See Comments)    Unknown  . Codeine Nausea And Vomiting    Social History: The patient  reports that she has never smoked. She has never used smokeless tobacco. She reports that she does not drink alcohol or use drugs.   Family History: The patient's family history includes CAD in her sister and son.   Review of Systems: Please see the history of present illness.   Otherwise, the review of systems is positive for none.   All other systems are reviewed and negative.   Physical Exam: VS:  BP (!) 186/90   Pulse 88   Ht  (1.6 m)   Wt 155 lb 12.8 oz (70.7 kg)   LMP  (LMP Unknown)   SpO2 96%   BMI 27.60 kg/m  .  BMI Body mass index is 27.6 kg/m.  Wt Readings from Last 3 Encounters:  11/28/16 155 lb 12.8 oz (70.7 kg)  10/02/16 150 lb (68 kg)  09/09/16 170 lb 12.8 oz (77.5 kg)   Affect appropriate Healthy:  appears stated  age HEENT: normal Neck supple with no adenopathy JVP normal no bruits no thyromegaly Lungs clear with no wheezing and good diaphragmatic motion Heart:  S1/S2 no murmur, no rub, gallop or click PMI normal Abdomen: benighn, BS positve, no tenderness, no AAA no bruit.  No HSM or HJR Distal pulses intact with no bruits No edema Neuro non-focal Skin warm and dry No muscular weakness    LABORATORY DATA:  EKG:  10/02/16 SR rate 86 low voltage ? Old IMI    Lab Results  Component Value Date   WBC 7.8 10/02/2016   HGB 10.3 (L) 10/02/2016   HCT 30.7 (L) 10/02/2016   PLT 257 10/02/2016   GLUCOSE 92 10/02/2016   ALT 16 10/02/2016   AST 25 10/02/2016   NA 140 10/02/2016   K 4.0 10/02/2016   CL 101 10/02/2016   CREATININE 1.01 (H) 10/02/2016   BUN 10 10/02/2016   CO2 24 10/02/2016   TSH 4.010 10/02/2016   INR 1.03 09/05/2016   HGBA1C 5.9 (H) 05/30/2016     BNP (last 3 results)  Recent Labs  07/28/16 0519  BNP 371.5*    ProBNP (last 3 results)  Recent Labs  08/14/16 1524 08/27/16 1136 10/02/16 1102  PROBNP 1,595* 2,762* 846*     Other Studies Reviewed Today:  Echo Study Conclusions 07/2016  - Left ventricle: Systolic function was mildly to moderately reduced. The estimated ejection fraction was in the range of 40% to 45%. Diffuse hypokinesis. No evidence of thrombus. - Ventricular septum: Septal motion showed abnormal function and dyssynergy. - Mitral valve: There was mild regurgitation. - Left atrium: LAA clip intact with thrombus distal and no connection to LA by 2D or color flow. The atrium was dilated. No evidence of thrombus in the atrial cavity or appendage. - Right atrium: The atrium was mildly dilated. - Atrial septum: There was increased thickness of the septum, consistent with lipomatous hypertrophy. No defect or patent foramen ovale was identified. - Tricuspid valve: There was moderate regurgitation. - Impressions:  DCC: Propofol Anesthesia Dr Leanord Hawking Single 120 J biphasic shock  Converted from atrial flutter rate 122 to NSR rate 78 with PAC;s No immediate neurologic sequelae On Rx Eliquis On iv amiodarone with additional 150 mg bolus given due to frequent PAC;s  Impressions:  - DCC: Propofol Anesthesia Dr Leanord Hawking Single 120 J biphasic shock Converted from atrial flutter rate 122 to NSR rate 78 with PAC;s No immediate neurologic sequelae On Rx Eliquis On iv amiodarone with additional 150 mg bolus given due to frequent PAC;s Successful cardioversion. No cardiac source of emboli was indentified.  Assessment/Plan:  1. PAF - Maintaining NSR on lower dose amiodarone  She remains on Eliquis. CHADSVAC of at least 5  Will consider tapering d/c amiodarone over next year     2. Chronic systolic HF -  EF 40-45% TEE 07/30/16   3. CAD -  CABG - no active chest pain.   4. Hypothyroidism - f/u labs - she is on replacement   5. Neuro:  Post redo back surgery with Wynetta Emery doing better infection healed hardware now right  Continue PT/OT    .   Overall, she does look much better today. Continue with her current regimen.   Charlton Haws

## 2016-11-28 ENCOUNTER — Ambulatory Visit: Payer: Medicare HMO | Admitting: Internal Medicine

## 2016-11-28 ENCOUNTER — Ambulatory Visit (INDEPENDENT_AMBULATORY_CARE_PROVIDER_SITE_OTHER): Payer: Medicare HMO | Admitting: Cardiovascular Disease

## 2016-11-28 ENCOUNTER — Encounter: Payer: Self-pay | Admitting: Cardiovascular Disease

## 2016-11-28 VITALS — BP 186/90 | HR 88 | Ht 63.0 in | Wt 155.8 lb

## 2016-11-28 DIAGNOSIS — I5033 Acute on chronic diastolic (congestive) heart failure: Secondary | ICD-10-CM | POA: Diagnosis not present

## 2016-11-28 DIAGNOSIS — I480 Paroxysmal atrial fibrillation: Secondary | ICD-10-CM

## 2016-11-28 DIAGNOSIS — I2510 Atherosclerotic heart disease of native coronary artery without angina pectoris: Secondary | ICD-10-CM

## 2016-11-28 NOTE — Patient Instructions (Signed)

## 2017-02-08 ENCOUNTER — Other Ambulatory Visit (HOSPITAL_BASED_OUTPATIENT_CLINIC_OR_DEPARTMENT_OTHER): Payer: Self-pay | Admitting: Neurosurgery

## 2017-02-08 ENCOUNTER — Ambulatory Visit (HOSPITAL_BASED_OUTPATIENT_CLINIC_OR_DEPARTMENT_OTHER)
Admission: RE | Admit: 2017-02-08 | Discharge: 2017-02-08 | Disposition: A | Discharge status: Home / Self Care | Payer: Medicare HMO | Source: Ambulatory Visit | Attending: Neurosurgery | Admitting: Neurosurgery

## 2017-02-08 DIAGNOSIS — M4317 Spondylolisthesis, lumbosacral region: Secondary | ICD-10-CM | POA: Diagnosis present

## 2017-02-08 DIAGNOSIS — M4328 Fusion of spine, sacral and sacrococcygeal region: Secondary | ICD-10-CM | POA: Diagnosis not present

## 2017-02-08 DIAGNOSIS — M4324 Fusion of spine, thoracic region: Secondary | ICD-10-CM | POA: Diagnosis not present

## 2017-02-08 DIAGNOSIS — M4315 Spondylolisthesis, thoracolumbar region: Secondary | ICD-10-CM

## 2017-02-08 DIAGNOSIS — Z9889 Other specified postprocedural states: Secondary | ICD-10-CM | POA: Insufficient documentation

## 2017-02-08 DIAGNOSIS — M4310 Spondylolisthesis, site unspecified: Secondary | ICD-10-CM

## 2017-05-27 NOTE — Progress Notes (Signed)
CARDIOLOGY OFFICE NOTE  Date:  06/04/2017    Erin [name] Date of Birth: [birth year] Medical Record #[id number]  PCP:  Caffie Damme, MD  Cardiologist:  Townsend Roger   No chief complaint on file.   History of Present Illness:  72 y.o. with PAF in setting sepsis. CAD with CABG DR Dorris Fetch 05/30/16 and LAA clipping. Multiple revisions of lumbar laminectomy By Dr Wynetta Emery for paravertebral abscess most recently July 2018 .  Recurrent PAF with TEE/DCC 07/04/16 after being loaded with amiodarone.  She had thrombus in LAA but clip intact. Repeat TEE/DCC done June 2018 for flutter. At that time EF 40-45% with mild MR  Doing well with no Palpitations dyspnea or edema.     Past Medical History:  Diagnosis Date  . A-fib (HCC)   . Anxiety   . Arthritis    "all my back is eat up w/it; knees too" (05/24/2016)  . CAD (coronary artery disease)    s/p CABG  . Chronic bronchitis (HCC)   . Chronic lower back pain   . Depression   . Dyspnea    "since OR 04/2016" (05/24/2016)  . Dysrhythmia   . Family history of adverse reaction to anesthesia    "daughter gets PONV" (05/24/2016)  . GERD (gastroesophageal reflux disease)    occ  . High cholesterol   . History of stomach ulcers   . Hypertension   . Hypothyroidism   . Migraine    "usually have one monthly; nothing since 04/25/2016)  . Pneumonia ~ 2002    Past Surgical History:  Procedure Laterality Date  . ANTERIOR CERVICAL DECOMP/DISCECTOMY FUSION  ~ 2009  . BACK SURGERY    . BACK SURGERY     JUNE  2018  . BLEPHAROPLASTY    . CARDIOVERSION N/A 07/04/2016   Procedure: CARDIOVERSION;  Surgeon: Wendall Stade, MD;  Location: [facility] ENDOSCOPY;  Service: Cardiovascular;  Laterality: N/A;  . CARDIOVERSION N/A 07/30/2016   Procedure: CARDIOVERSION;  Surgeon: Wendall Stade, MD;  Location: [facility] ENDOSCOPY;  Service: Cardiovascular;  Laterality: N/A;  . CARPAL TUNNEL RELEASE Bilateral 80's  . CATARACT EXTRACTION, BILATERAL    . CORONARY ARTERY  BYPASS GRAFT N/A 05/30/2016   Procedure: CORONARY ARTERY BYPASS GRAFTING (CABG), ON PUMP, TIMES FOUR, USING LEFT INTERNAL MAMMARY ARTERY AND ENDOSCOPICALLY HARVESTED BILATERAL GREATER SAPHENOUS VEINS WITH CLIPPING OF LEFT ATRIAL APPENDAGE;  Surgeon: Loreli Slot, MD;  Location: [facility] OR;  Service: Open Heart Surgery;  Laterality: N/A;  LIMA to LAD, SVG to RAMUS INTERMEDIATE, and SVG SEQUENTIALLY to CIRCUMFLEX and PDA  . IR FLUORO GUIDED NEEDLE PLC ASPIRATION/INJECTION LOC  07/12/2016  . KNEE ARTHROSCOPY Bilateral 2000s  . LEFT HEART CATH AND CORONARY ANGIOGRAPHY N/A 05/29/2016   Procedure: Left Heart Cath and Coronary Angiography;  Surgeon: Lennette Bihari, MD;  Location: [facility] INVASIVE CV LAB;  Service: Cardiovascular;  Laterality: N/A;  . LUMBAR DISC SURGERY  2006; 07/2005  . LUMBAR WOUND DEBRIDEMENT N/A 06/26/2016   Procedure: Incision and Drainage of LUMBAR WOUND;  Surgeon: Donalee Citrin, MD;  Location: [facility] OR;  Service: Neurosurgery;  Laterality: N/A;  . POSTERIOR LUMBAR FUSION  04/2016  . TEE WITHOUT CARDIOVERSION N/A 07/04/2016   Procedure: TRANSESOPHAGEAL ECHOCARDIOGRAM (TEE);  Surgeon: Wendall Stade, MD;  Location: [facility] ENDOSCOPY;  Service: Cardiovascular;  Laterality: N/A;  . TEE WITHOUT CARDIOVERSION N/A 07/30/2016   Procedure: TRANSESOPHAGEAL ECHOCARDIOGRAM (TEE);  Surgeon: Wendall Stade, MD;  Location: [facility] ENDOSCOPY;  Service: Cardiovascular;  Laterality:  N/A;  . TONSILLECTOMY    . TUBAL LIGATION       Medications: Current Meds  Medication Sig  . acetaminophen (TYLENOL) 325 MG tablet Take 650 mg by mouth every 4 (four) hours as needed for mild pain or fever.  Marland Kitchen amiodarone (PACERONE) 200 MG tablet Take 1 tablet (200 mg total) by mouth daily.  Marland Kitchen apixaban (ELIQUIS) 5 MG TABS tablet Take 1 tablet (5 mg total) by mouth 2 (two) times daily.  Marland Kitchen aspirin EC 81 MG tablet Take 81 mg by mouth daily.  Marland Kitchen atorvastatin (LIPITOR) 80 MG tablet Take 1 tablet (80 mg total) by mouth daily at 6 PM.  .  Biotin 10 MG CAPS Take 10 mg by mouth daily.  . Calcium 250 MG CAPS Take by mouth as directed.  . Cholecalciferol (VITAMIN D) 2000 units CAPS Take 2,000 Units by mouth daily after breakfast.  . cyclobenzaprine (FLEXERIL) 10 MG tablet Take 1 tablet (10 mg total) by mouth 3 (three) times daily as needed for muscle spasms.  . cycloSPORINE (RESTASIS) 0.05 % ophthalmic emulsion Place 1 drop into both eyes 2 (two) times daily.  Marland Kitchen diltiazem (CARDIZEM) 60 MG tablet Take 1 tablet (60 mg total) by mouth every 6 (six) hours.  . ferrous sulfate 325 (65 FE) MG tablet Take by mouth as directed.  . furosemide (LASIX) 40 MG tablet Take 40 mg by mouth daily.  Marland Kitchen levothyroxine (SYNTHROID, LEVOTHROID) 150 MCG tablet Take 150 mcg by mouth daily before breakfast.   . Magnesium 125 MG CAPS Take by mouth as directed.  . magnesium chloride (SLOW-MAG) 64 MG TBEC SR tablet Take by mouth as directed.  . ondansetron (ZOFRAN) 4 MG tablet Take 8 mg by mouth every 8 (eight) hours.  . pantoprazole (PROTONIX) 40 MG tablet Take 1 tablet (40 mg total) by mouth at bedtime.  . potassium chloride SA (K-DUR,KLOR-CON) 20 MEQ tablet Take 2 tablets (40 mEq total) by mouth daily.  . Prenat-FeCbn-FeBisg-FA-Omega (MULTIVITAMIN/MINERALS PO) Take 1 tablet by mouth daily.  Marland Kitchen senna (SENOKOT) 8.6 MG TABS tablet Take 1 tablet by mouth 2 (two) times daily.  . traMADol (ULTRAM) 50 MG tablet Take 100 mg by mouth every 4 (four) hours as needed for moderate pain or severe pain.      Allergies: Allergies  Allergen Reactions  . Ace Inhibitors Swelling and Other (See Comments)    ACE stopped after pt seen in ED with facial swelling- allergy testing pending  . Ambien [Zolpidem] Other (See Comments)    confusion  . Fentanyl Nausea And Vomiting and Other (See Comments)    PATCH     . Morphine And Related Other (See Comments)    Unknown  . Codeine Nausea And Vomiting    Social History: The patient  reports that she has never smoked. She has  never used smokeless tobacco. She reports that she does not drink alcohol or use drugs.   Family History: The patient's family history includes CAD in her sister and son.   Review of Systems: Please see the history of present illness.   Otherwise, the review of systems is positive for none.   All other systems are reviewed and negative.   Physical Exam: Vitals:   06/04/17 1038  BP: (!) 148/88  Pulse: (!) 34  SpO2: 96%     Wt Readings from Last 3 Encounters:  06/04/17 161 lb 12 oz (73.4 kg)  11/28/16 155 lb 12.8 oz (70.7 kg)  10/02/16 150 lb (68 kg)  Affect appropriate Healthy:  appears stated age HEENT: normal Neck supple with no adenopathy JVP normal no bruits no thyromegaly Lungs clear with no wheezing and good diaphragmatic motion Heart:  S1/S2 no murmur, no rub, gallop or click PMI normal post sternotomy  Abdomen: benighn, BS positve, no tenderness, no AAA no bruit.  No HSM or HJR Distal pulses intact with no bruits No edema Neuro non-focal post multiple back surgeries walks with cane  Skin warm and dry No muscular weakness     LABORATORY DATA:  EKG:  10/02/16 SR rate 86 low voltage ? Old IMI    Lab Results  Component Value Date   WBC 7.8 10/02/2016   HGB 10.3 (L) 10/02/2016   HCT 30.7 (L) 10/02/2016   PLT 257 10/02/2016   GLUCOSE 92 10/02/2016   ALT 16 10/02/2016   AST 25 10/02/2016   NA 140 10/02/2016   K 4.0 10/02/2016   CL 101 10/02/2016   CREATININE 1.01 (H) 10/02/2016   BUN 10 10/02/2016   CO2 24 10/02/2016   TSH 4.010 10/02/2016   INR 1.03 09/05/2016   HGBA1C 5.9 (H) 05/30/2016     BNP (last 3 results) Recent Labs    07/28/16 0519  BNP 371.5*    ProBNP (last 3 results) Recent Labs    08/14/16 1524 08/27/16 1136 10/02/16 1102  PROBNP 1,595* 2,762* 846*     Other Studies Reviewed Today:  Echo Study Conclusions 07/2016  - Left ventricle: Systolic function was mildly to moderately reduced. The estimated ejection  fraction was in the range of 40% to 45%. Diffuse hypokinesis. No evidence of thrombus. - Ventricular septum: Septal motion showed abnormal function and dyssynergy. - Mitral valve: There was mild regurgitation. - Left atrium: LAA clip intact with thrombus distal and no connection to LA by 2D or color flow. The atrium was dilated. No evidence of thrombus in the atrial cavity or appendage. - Right atrium: The atrium was mildly dilated. - Atrial septum: There was increased thickness of the septum, consistent with lipomatous hypertrophy. No defect or patent foramen ovale was identified. - Tricuspid valve: There was moderate regurgitation. - Impressions: DCC: Propofol Anesthesia Dr Leanord Hawking Single 120 J biphasic shock Converted from atrial flutter rate 122 to NSR rate 78 with PAC;s No immediate neurologic sequelae On Rx Eliquis On iv amiodarone with additional 150 mg bolus given due to frequent PAC;s  Impressions:  - DCC: Propofol Anesthesia Dr Leanord Hawking Single 120 J biphasic shock Converted from atrial flutter rate 122 to NSR rate 78 with PAC;s No immediate neurologic sequelae On Rx Eliquis On iv amiodarone with additional 150 mg bolus given due to frequent PAC;s Successful cardioversion. No cardiac source of emboli was indentified.  Assessment/Plan:  1. PAF - Maintaining NSR on lower dose amiodarone  She remains on Eliquis. CHADSVAC of at least 5  PACls on exam today continue RX   2. Chronic systolic HF -  EF 40-45% TEE 07/30/16 but normal cath 05/29/16 will update echo Not on ACE/Entrsto Due to facial swelling angioedema. F/u echo in 6 months   3. CAD -  CABG  05/30/16 - no active chest pain.   4. Hypothyroidism - f/u labs - she is on replacement TSH 4 10/02/16   5. Neuro:  Post redo back surgery with Wynetta Emery doing better infection healed hardware now right  Continue PT/OT    .    Charlton Haws

## 2017-06-04 ENCOUNTER — Ambulatory Visit (INDEPENDENT_AMBULATORY_CARE_PROVIDER_SITE_OTHER): Payer: Medicare HMO | Admitting: Cardiovascular Disease

## 2017-06-04 ENCOUNTER — Encounter: Payer: Self-pay | Admitting: Cardiovascular Disease

## 2017-06-04 VITALS — BP 148/88 | HR 34 | Ht 63.0 in | Wt 161.8 lb

## 2017-06-04 DIAGNOSIS — Z951 Presence of aortocoronary bypass graft: Secondary | ICD-10-CM | POA: Diagnosis not present

## 2017-06-04 DIAGNOSIS — I480 Paroxysmal atrial fibrillation: Secondary | ICD-10-CM | POA: Diagnosis not present

## 2017-06-04 DIAGNOSIS — I5022 Chronic systolic (congestive) heart failure: Secondary | ICD-10-CM | POA: Diagnosis not present

## 2017-06-04 DIAGNOSIS — I2510 Atherosclerotic heart disease of native coronary artery without angina pectoris: Secondary | ICD-10-CM

## 2017-06-04 NOTE — Patient Instructions (Addendum)

## 2017-10-14 ENCOUNTER — Other Ambulatory Visit: Payer: Self-pay | Admitting: Nurse Practitioner

## 2017-10-14 NOTE — Telephone Encounter (Addendum)
Pt last saw Dr Eden Emms 06/04/17, last labs 10/02/16 Cr 1.01, no more recent labwork in [facility] or care everywhere.  Called pt's primary MD Dr Caffie Damme, [facility] to call back with most recent labwork results. Age 72, weight 73.4.  Awaiting lab results from primary MD.  Labs received 10/18/17 from [facility] 07/16/17 Creat 1.2.  Based on specified criteria pt is on appropriate dosage of Eliquis 5mg  BID.  Will refill rx.

## 2017-11-08 ENCOUNTER — Telehealth: Payer: Self-pay | Admitting: Cardiovascular Disease

## 2017-11-08 NOTE — Telephone Encounter (Signed)
Called patient about her message. Patient complaining of elevated BP and headaches. Patient had a death in the family last week (brother-in-law), she thinks this might be the cause of her BP going up. Patient stated today she has no headache, and she has no other symptoms. Patient stated she has been keeping to a low salt diet. Patient stated her physical therapist suggested she give our office a call and let Dr. Eden Emms know about her BPs. 190/92, 171/93, 180/90, 168/82, 162/99, 177/76 and 154/85. Will forward to Dr. Eden Emms for advisement.

## 2017-11-08 NOTE — Telephone Encounter (Signed)
Refill her cardizem if she is not taking make it long acting 240 mg daily

## 2017-11-08 NOTE — Telephone Encounter (Signed)
New Message   Pt c/o BP issue: STAT if pt c/o blurred vision, one-sided weakness or slurred speech  1. What are your last 5 BP readings? 190/92, 171/93, 180/90, 168/82, 162/99, 177/76 and 154/85  2. Are you having any other symptoms (ex. Dizziness, headache, blurred vision, passed out)? Headaches everyday except today  3. What is your BP issue? Pt states she has been experiencing some elevated blood pressures and her physical therapist instructed her to call her heart doctor. Please call

## 2017-11-11 NOTE — Telephone Encounter (Signed)
Left message for patient to call back  

## 2017-11-12 MED ORDER — DILTIAZEM HCL ER COATED BEADS 240 MG PO CP24: 240.0000 mg | ORAL_CAPSULE | Freq: Every day | ORAL | 11 refills | Status: DC

## 2017-11-12 NOTE — Telephone Encounter (Signed)
Reviewed med changes with patient's son (DPR).  Instructed him to have patient STOP CARDIZEM 60 q6h and START long acting CARDIZEM 240 mg daily. He will have her continue to monitor BP and call if BP does not decrease.  Rescheduled echo and OV with Dr. Eden Emms per DPR request as he said that is what Dr. Eden Emms said he wanted. Echo and OV rescheduled to 11/14. He was grateful for call and agrees with treatment plan.

## 2017-11-12 NOTE — Telephone Encounter (Signed)
Follow Up: ° ° ° °Returning Erin [name]'s call from yesterday. °

## 2017-11-19 ENCOUNTER — Telehealth: Payer: Self-pay | Admitting: Cardiovascular Disease

## 2017-11-19 NOTE — Telephone Encounter (Signed)
New Message:      Pt is calling and states her medication situation that was previously discussed last week has still not been resolved.

## 2017-11-19 NOTE — Telephone Encounter (Signed)
Patient calling complaining of her BP being too high. SBP 159 to 192, DBP 90's, and HR 60's - 70's. Patient's diltiazem was increased from 60 mg TID to 240 mg daily. Patient has been taking diltiazem 240 mg daily for only 4 days. Patient stated she is keeping a low salt diet. Patient stated she could not do physical therapy this morning due to her BP 188/92 HR 69. Informed patient that a message would be sent to Dr. Eden Emms for advisement. Patient verbalized understanding.

## 2017-11-20 ENCOUNTER — Other Ambulatory Visit (HOSPITAL_COMMUNITY): Payer: Medicare HMO

## 2017-11-20 MED ORDER — HYDRALAZINE HCL 25 MG PO TABS: 25.0000 mg | ORAL_TABLET | Freq: Two times a day (BID) | ORAL | 3 refills | Status: DC

## 2017-11-20 NOTE — Telephone Encounter (Signed)
Called patient back with Dr. Fabio Bering recommendations. Patient verbalized understanding and will start taking hydralazine today.

## 2017-11-20 NOTE — Telephone Encounter (Signed)
Add hydralazine 25 bid take cardizem in am and hydralazine in afternoon and early evening

## 2017-11-25 ENCOUNTER — Telehealth: Payer: Self-pay | Admitting: Cardiovascular Disease

## 2017-11-25 NOTE — Telephone Encounter (Signed)
New Message:      Pt states she needs to speak to you in reference to her BP. She states you all spoke last week on it and she states it's not getting any better. Pt states she needs a return call this morning

## 2017-11-25 NOTE — Progress Notes (Deleted)
Cardiology Office Note   Date:  11/25/2017   ID:  Erin [name], Erin [name] 31-Mar-1945, MRN [id number]  PCP:  Caffie Damme, MD  Cardiologist:  Dr. Eden Emms    No chief complaint on file.     History of Present Illness: Erin [name] is a 72 y.o. female who presents for HTN  72 y.o. with PAF in setting sepsis. CAD with CABG DR Dorris Fetch 05/30/16 and LAA clipping. Multiple revisions of lumbar laminectomy By Dr Wynetta Emery for paravertebral abscess most recently July 2018 .  Recurrent PAF with TEE/DCC 07/04/16 after being loaded with amiodarone.  She had thrombus in LAA but clip intact. Repeat TEE/DCC done June 2018 for flutter. At that time EF 40-45% with mild MR  Doing well with no Palpitations dyspnea or edema.    Past Medical History:  Diagnosis Date  . A-fib (HCC)   . Anxiety   . Arthritis    "all my back is eat up w/it; knees too" (05/24/2016)  . CAD (coronary artery disease)    s/p CABG  . Chronic bronchitis (HCC)   . Chronic lower back pain   . Depression   . Dyspnea    "since OR 04/2016" (05/24/2016)  . Dysrhythmia   . Family history of adverse reaction to anesthesia    "daughter gets PONV" (05/24/2016)  . GERD (gastroesophageal reflux disease)    occ  . High cholesterol   . History of stomach ulcers   . Hypertension   . Hypothyroidism   . Migraine    "usually have one monthly; nothing since 04/25/2016)  . Pneumonia ~ 2002    Past Surgical History:  Procedure Laterality Date  . ANTERIOR CERVICAL DECOMP/DISCECTOMY FUSION  ~ 2009  . BACK SURGERY    . BACK SURGERY     JUNE  2018  . BLEPHAROPLASTY    . CARDIOVERSION N/A 07/04/2016   Procedure: CARDIOVERSION;  Surgeon: Wendall Stade, MD;  Location: [facility] ENDOSCOPY;  Service: Cardiovascular;  Laterality: N/A;  . CARDIOVERSION N/A 07/30/2016   Procedure: CARDIOVERSION;  Surgeon: Wendall Stade, MD;  Location: [facility] ENDOSCOPY;  Service: Cardiovascular;  Laterality: N/A;  . CARPAL TUNNEL RELEASE Bilateral 80's  . CATARACT EXTRACTION,  BILATERAL    . CORONARY ARTERY BYPASS GRAFT N/A 05/30/2016   Procedure: CORONARY ARTERY BYPASS GRAFTING (CABG), ON PUMP, TIMES FOUR, USING LEFT INTERNAL MAMMARY ARTERY AND ENDOSCOPICALLY HARVESTED BILATERAL GREATER SAPHENOUS VEINS WITH CLIPPING OF LEFT ATRIAL APPENDAGE;  Surgeon: Loreli Slot, MD;  Location: [facility] OR;  Service: Open Heart Surgery;  Laterality: N/A;  LIMA to LAD, SVG to RAMUS INTERMEDIATE, and SVG SEQUENTIALLY to CIRCUMFLEX and PDA  . IR FLUORO GUIDED NEEDLE PLC ASPIRATION/INJECTION LOC  07/12/2016  . KNEE ARTHROSCOPY Bilateral 2000s  . LEFT HEART CATH AND CORONARY ANGIOGRAPHY N/A 05/29/2016   Procedure: Left Heart Cath and Coronary Angiography;  Surgeon: Lennette Bihari, MD;  Location: [facility] INVASIVE CV LAB;  Service: Cardiovascular;  Laterality: N/A;  . LUMBAR DISC SURGERY  2006; 07/2005  . LUMBAR WOUND DEBRIDEMENT N/A 06/26/2016   Procedure: Incision and Drainage of LUMBAR WOUND;  Surgeon: Donalee Citrin, MD;  Location: [facility] OR;  Service: Neurosurgery;  Laterality: N/A;  . POSTERIOR LUMBAR FUSION  04/2016  . TEE WITHOUT CARDIOVERSION N/A 07/04/2016   Procedure: TRANSESOPHAGEAL ECHOCARDIOGRAM (TEE);  Surgeon: Wendall Stade, MD;  Location: [facility] ENDOSCOPY;  Service: Cardiovascular;  Laterality: N/A;  . TEE WITHOUT CARDIOVERSION N/A 07/30/2016   Procedure: TRANSESOPHAGEAL ECHOCARDIOGRAM (TEE);  Surgeon: Wendall Stade,  MD;  Location: [facility] ENDOSCOPY;  Service: Cardiovascular;  Laterality: N/A;  . TONSILLECTOMY    . TUBAL LIGATION       Current Outpatient Medications  Medication Sig Dispense Refill  . acetaminophen (TYLENOL) 325 MG tablet Take 650 mg by mouth every 4 (four) hours as needed for mild pain or fever.    Marland Kitchen amiodarone (PACERONE) 200 MG tablet Take 1 tablet (200 mg total) by mouth daily. 90 tablet 3  . aspirin EC 81 MG tablet Take 81 mg by mouth daily.    Marland Kitchen atorvastatin (LIPITOR) 80 MG tablet Take 1 tablet (80 mg total) by mouth daily at 6 PM.    . Biotin 10 MG CAPS Take 10 mg by  mouth daily.    . Calcium 250 MG CAPS Take by mouth as directed.    . Cholecalciferol (VITAMIN D) 2000 units CAPS Take 2,000 Units by mouth daily after breakfast.    . cyclobenzaprine (FLEXERIL) 10 MG tablet Take 1 tablet (10 mg total) by mouth 3 (three) times daily as needed for muscle spasms. 30 tablet 0  . cycloSPORINE (RESTASIS) 0.05 % ophthalmic emulsion Place 1 drop into both eyes 2 (two) times daily.    Marland Kitchen diltiazem (CARDIZEM CD) 240 MG 24 hr capsule Take 1 capsule (240 mg total) by mouth daily. 30 capsule 11  . ELIQUIS 5 MG TABS tablet TAKE 1 TABLET BY MOUTH TWICE DAILY 60 tablet 6  . ferrous sulfate 325 (65 FE) MG tablet Take by mouth as directed.    . furosemide (LASIX) 40 MG tablet Take 40 mg by mouth daily.    . hydrALAZINE (APRESOLINE) 25 MG tablet Take 1 tablet (25 mg total) by mouth 2 (two) times daily. Take one in the afternoon and one in the early evening. 180 tablet 3  . levothyroxine (SYNTHROID, LEVOTHROID) 150 MCG tablet Take 150 mcg by mouth daily before breakfast.     . Magnesium 125 MG CAPS Take by mouth as directed.    . magnesium chloride (SLOW-MAG) 64 MG TBEC SR tablet Take by mouth as directed.    . ondansetron (ZOFRAN) 4 MG tablet Take 8 mg by mouth every 8 (eight) hours.    . pantoprazole (PROTONIX) 40 MG tablet Take 1 tablet (40 mg total) by mouth at bedtime. 30 tablet 0  . potassium chloride SA (K-DUR,KLOR-CON) 20 MEQ tablet Take 2 tablets (40 mEq total) by mouth daily.    . Prenat-FeCbn-FeBisg-FA-Omega (MULTIVITAMIN/MINERALS PO) Take 1 tablet by mouth daily.    Marland Kitchen senna (SENOKOT) 8.6 MG TABS tablet Take 1 tablet by mouth 2 (two) times daily.    . traMADol (ULTRAM) 50 MG tablet Take 100 mg by mouth every 4 (four) hours as needed for moderate pain or severe pain.      No current facility-administered medications for this visit.     Allergies:   Ace inhibitors; Ambien [zolpidem]; Fentanyl; Morphine and related; and Codeine    Social History:  The patient  reports  that she has never smoked. She has never used smokeless tobacco. She reports that she does not drink alcohol or use drugs.   Family History:  The patient's ***family history includes CAD in her sister and son.    ROS:  General:no colds or fevers, no weight changes Skin:no rashes or ulcers HEENT:no blurred vision, no congestion CV:see HPI PUL:see HPI GI:no diarrhea constipation or melena, no indigestion GU:no hematuria, no dysuria MS:no joint pain, no claudication Neuro:no syncope, no lightheadedness Endo:no diabetes, no thyroid  disease Wt Readings from Last 3 Encounters:  06/04/17 161 lb 12 oz (73.4 kg)  11/28/16 155 lb 12.8 oz (70.7 kg)  10/02/16 150 lb (68 kg)     PHYSICAL EXAM: VS:  LMP  (LMP Unknown)  , BMI There is no height or weight on file to calculate BMI. General:Pleasant affect, NAD Skin:Warm and dry, brisk capillary refill HEENT:normocephalic, sclera clear, mucus membranes moist Neck:supple, no JVD, no bruits  Heart:S1S2 RRR without murmur, gallup, rub or click Lungs:clear without rales, rhonchi, or wheezes KRC:VKFM, non tender, + BS, do not palpate liver spleen or masses Ext:no lower ext edema, 2+ pedal pulses, 2+ radial pulses Neuro:alert and oriented, MAE, follows commands, + facial symmetry    EKG:  EKG is ordered today. The ekg ordered today demonstrates ***   Recent Labs: No results found for requested labs within last 8760 hours.    Lipid Panel No results found for: CHOL, TRIG, HDL, CHOLHDL, VLDL, LDLCALC, LDLDIRECT     Other studies Reviewed: Additional studies/ records that were reviewed today include: ***.   ASSESSMENT AND PLAN:  1.  ***   Current medicines are reviewed with the patient today.  The patient Has no concerns regarding medicines.  The following changes have been made:  See above Labs/ tests ordered today include:see above  Disposition:   FU:  see above  Signed, Nada Boozer, NP  11/25/2017 9:46 PM    [facility] [address] [place], [place], [place]  [address]/ [address] [place], [place] Phone: [phone]; Fax: [phone]  [phone]

## 2017-11-25 NOTE — Telephone Encounter (Signed)
Patient complaining of BP still being elevated and not coming down. Right now BP 178/109 HR 69. Encouraged patient to take her hydralazine now instead of later. Patient stated she had a hard time getting her BP down last time. Patient has called several times with her BP in the last couple of weeks, so made her an appointment with Nada Boozer NP for tomorrow to be elevated. Patient agreed to this plan and will keep her appointment tomorrow.

## 2017-11-26 ENCOUNTER — Ambulatory Visit: Payer: Medicare HMO | Admitting: Cardiology

## 2017-11-26 ENCOUNTER — Ambulatory Visit (INDEPENDENT_AMBULATORY_CARE_PROVIDER_SITE_OTHER): Payer: Medicare HMO | Admitting: Nurse Practitioner

## 2017-11-26 ENCOUNTER — Encounter: Payer: Self-pay | Admitting: Nurse Practitioner

## 2017-11-26 VITALS — BP 150/70 | HR 66 | Ht 63.0 in | Wt 158.8 lb

## 2017-11-26 DIAGNOSIS — I480 Paroxysmal atrial fibrillation: Secondary | ICD-10-CM | POA: Diagnosis not present

## 2017-11-26 DIAGNOSIS — Z79899 Other long term (current) drug therapy: Secondary | ICD-10-CM

## 2017-11-26 DIAGNOSIS — I259 Chronic ischemic heart disease, unspecified: Secondary | ICD-10-CM | POA: Diagnosis not present

## 2017-11-26 DIAGNOSIS — I10 Essential (primary) hypertension: Secondary | ICD-10-CM | POA: Diagnosis not present

## 2017-11-26 LAB — BASIC METABOLIC PANEL
BUN/Creatinine Ratio: 11 — ABNORMAL LOW (ref 12–28)
BUN: 13 mg/dL (ref 8–27)
CO2: 26 mmol/L (ref 20–29)
Calcium: 9.7 mg/dL (ref 8.7–10.3)
Chloride: 100 mmol/L (ref 96–106)
Creatinine, Ser: 1.19 mg/dL — ABNORMAL HIGH (ref 0.57–1.00)
GFR calc Af Amer: 52 mL/min/{1.73_m2} — ABNORMAL LOW (ref >59)
GFR calc non Af Amer: 45 mL/min/{1.73_m2} — ABNORMAL LOW (ref >59)
Glucose: 87 mg/dL (ref 65–99)
Potassium: 4.6 mmol/L (ref 3.5–5.2)
Sodium: 143 mmol/L (ref 134–144)

## 2017-11-26 LAB — CBC
Hematocrit: 39.8 % (ref 34.0–46.6)
Hemoglobin: 13.9 g/dL (ref 11.1–15.9)
MCH: 32.3 pg (ref 26.6–33.0)
MCHC: 34.9 g/dL (ref 31.5–35.7)
MCV: 93 fL (ref 79–97)
Platelets: 169 10*3/uL (ref 150–450)
RBC: 4.3 x10E6/uL (ref 3.77–5.28)
RDW: 14.9 % (ref 12.3–15.4)
WBC: 5.5 10*3/uL (ref 3.4–10.8)

## 2017-11-26 LAB — HEPATIC FUNCTION PANEL
ALT: 17 IU/L (ref 0–32)
AST: 23 IU/L (ref 0–40)
Albumin: 4.2 g/dL (ref 3.5–4.8)
Alkaline Phosphatase: 150 IU/L — ABNORMAL HIGH (ref 39–117)
Bilirubin Total: 0.3 mg/dL (ref 0.0–1.2)
Bilirubin, Direct: 0.11 mg/dL (ref 0.00–0.40)
Total Protein: 6.4 g/dL (ref 6.0–8.5)

## 2017-11-26 LAB — TSH: TSH: 1.46 u[IU]/mL (ref 0.450–4.500)

## 2017-11-26 MED ORDER — HYDRALAZINE HCL 25 MG PO TABS: 50.0000 mg | ORAL_TABLET | Freq: Two times a day (BID) | ORAL | 3 refills | Status: DC

## 2017-11-26 NOTE — Patient Instructions (Addendum)
We will be checking the following labs today - BMET, CBC, HPF, and TSh  If you have labs (blood work) drawn today and your tests are completely normal, you will receive your results only by: Marland Kitchen MyChart Message (if you have MyChart) OR . A paper copy in the mail If you have any lab test that is abnormal or we need to change your treatment, we will call you to review the results.   Medication Instructions:    Continue with your current medicines. BUT  I am increasing the Hydralazine to 50 mg twice a day - you can take 2 of the 25 mg tablets twice a day and use up.     If you need a refill on your cardiac medications before your next appointment, please call your pharmacy.     Testing/Procedures To Be Arranged:  N/A  Follow-Up:   See me in about 10 to 14 days. Please try to get a new BP cuff (Omron) and bring to your next visit.     At [facility], you and your health needs are our priority.  As part of our continuing mission to provide you with exceptional heart care, we have created designated Provider Care Teams.  These Care Teams include your primary Cardiologist (physician) and Advanced Practice Providers (APPs -  Physician Assistants and Nurse Practitioners) who all work together to provide you with the care you need, when you need it.   Call the [facility] office at [phone] if you have any questions, problems or concerns.

## 2017-11-26 NOTE — Progress Notes (Addendum)
CARDIOLOGY OFFICE NOTE  Date:  11/26/2017    Erin [name] Date of Birth: 1945-04-24 Medical Record #[id number]  PCP:  Caffie Damme, MD  Cardiologist:  Townsend Roger    Chief Complaint  Patient presents with  . Hypertension    Work in visit - seen for Dr. Eden Emms    History of Present Illness: Erin [name] is a 72 y.o. female who presents today for a work in visit. Seen for Dr. Eden Emms.   She has a hx of CAD/CABG and PAF. She has had CABG by Dr Dorris Fetch 05/30/16 with LAA clipping. Her course was complicated by [facility] sepsis and paravertebral abscess post lumbar laminectomy from March os 2018 with revision by Dr Wynetta Emery 06/26/16. She had recurrent PAF - was placed back on Eliquis and was loaded with amiodarone and has had TEE cardioversion 07/04/16 and there was thrombus in her LAA but clip was intact with no communication to her LA and no other thrombus. She went back into rapid atrial flutter with rates in the 120s. She was symptomatic with palpitations. She required repeat TEE cardioversion 07/2016 back to NSR. EF has dropped to 40-45% with moderate MR. She was readmitted towards the end of July by Dr. Wynetta Emery and had  "exploration of fusion removal of hardware L2-S1 with removal of bilateral loose L2 pedicle screws which were sent for culture and removal of rods and all top tightening nuts and cross-link".  She did ok with this surgery. Tolerated ok. Followed by cardiology.   I saw her several times during that illness. Last seen by Dr. Eden Emms in April of 2019 and felt to be doing well. Echo has been arranged for next month to follow up her EF.    Comes in today. Here alone. She notes that over the past few weeks her "BP has been crazy". She has not been able to get her PT for her back due to elevated readings there. She is using a wrist cuff at home. She has had readings over 200 systolic. May be eating out a little more. No swelling. No chest pain. Feels tired and weak. No active  bleeding but does bruise considerably.   Past Medical History:  Diagnosis Date  . A-fib (HCC)   . Anxiety   . Arthritis    "all my back is eat up w/it; knees too" (05/24/2016)  . CAD (coronary artery disease)    s/p CABG  . Chronic bronchitis (HCC)   . Chronic lower back pain   . Depression   . Dyspnea    "since OR 04/2016" (05/24/2016)  . Dysrhythmia   . Family history of adverse reaction to anesthesia    "daughter gets PONV" (05/24/2016)  . GERD (gastroesophageal reflux disease)    occ  . High cholesterol   . History of stomach ulcers   . Hypertension   . Hypothyroidism   . Migraine    "usually have one monthly; nothing since 04/25/2016)  . Pneumonia ~ 2002    Past Surgical History:  Procedure Laterality Date  . ANTERIOR CERVICAL DECOMP/DISCECTOMY FUSION  ~ 2009  . BACK SURGERY    . BACK SURGERY     JUNE  2018  . BLEPHAROPLASTY    . CARDIOVERSION N/A 07/04/2016   Procedure: CARDIOVERSION;  Surgeon: Wendall Stade, MD;  Location: [facility] ENDOSCOPY;  Service: Cardiovascular;  Laterality: N/A;  . CARDIOVERSION N/A 07/30/2016   Procedure: CARDIOVERSION;  Surgeon: Wendall Stade, MD;  Location: [facility] ENDOSCOPY;  Service: Cardiovascular;  Laterality: N/A;  . CARPAL TUNNEL RELEASE Bilateral 80's  . CATARACT EXTRACTION, BILATERAL    . CORONARY ARTERY BYPASS GRAFT N/A 05/30/2016   Procedure: CORONARY ARTERY BYPASS GRAFTING (CABG), ON PUMP, TIMES FOUR, USING LEFT INTERNAL MAMMARY ARTERY AND ENDOSCOPICALLY HARVESTED BILATERAL GREATER SAPHENOUS VEINS WITH CLIPPING OF LEFT ATRIAL APPENDAGE;  Surgeon: Loreli Slot, MD;  Location: [facility] OR;  Service: Open Heart Surgery;  Laterality: N/A;  LIMA to LAD, SVG to RAMUS INTERMEDIATE, and SVG SEQUENTIALLY to CIRCUMFLEX and PDA  . IR FLUORO GUIDED NEEDLE PLC ASPIRATION/INJECTION LOC  07/12/2016  . KNEE ARTHROSCOPY Bilateral 2000s  . LEFT HEART CATH AND CORONARY ANGIOGRAPHY N/A 05/29/2016   Procedure: Left Heart Cath and Coronary Angiography;  Surgeon:  Lennette Bihari, MD;  Location: [facility] INVASIVE CV LAB;  Service: Cardiovascular;  Laterality: N/A;  . LUMBAR DISC SURGERY  2006; 07/2005  . LUMBAR WOUND DEBRIDEMENT N/A 06/26/2016   Procedure: Incision and Drainage of LUMBAR WOUND;  Surgeon: Donalee Citrin, MD;  Location: [facility] OR;  Service: Neurosurgery;  Laterality: N/A;  . POSTERIOR LUMBAR FUSION  04/2016  . TEE WITHOUT CARDIOVERSION N/A 07/04/2016   Procedure: TRANSESOPHAGEAL ECHOCARDIOGRAM (TEE);  Surgeon: Wendall Stade, MD;  Location: [facility] ENDOSCOPY;  Service: Cardiovascular;  Laterality: N/A;  . TEE WITHOUT CARDIOVERSION N/A 07/30/2016   Procedure: TRANSESOPHAGEAL ECHOCARDIOGRAM (TEE);  Surgeon: Wendall Stade, MD;  Location: [facility] ENDOSCOPY;  Service: Cardiovascular;  Laterality: N/A;  . TONSILLECTOMY    . TUBAL LIGATION       Medications: Current Meds  Medication Sig  . acetaminophen (TYLENOL) 325 MG tablet Take 650 mg by mouth every 4 (four) hours as needed for mild pain or fever.  Marland Kitchen amiodarone (PACERONE) 200 MG tablet Take 1 tablet (200 mg total) by mouth daily.  Marland Kitchen aspirin EC 81 MG tablet Take 81 mg by mouth daily.  Marland Kitchen atorvastatin (LIPITOR) 80 MG tablet Take 1 tablet (80 mg total) by mouth daily at 6 PM.  . Biotin 10 MG CAPS Take 10 mg by mouth daily.  . Calcium 250 MG CAPS Take by mouth as directed.  . Cholecalciferol (VITAMIN D) 2000 units CAPS Take 2,000 Units by mouth daily after breakfast.  . cyclobenzaprine (FLEXERIL) 10 MG tablet Take 1 tablet (10 mg total) by mouth 3 (three) times daily as needed for muscle spasms.  . cycloSPORINE (RESTASIS) 0.05 % ophthalmic emulsion Place 1 drop into both eyes 2 (two) times daily.  Marland Kitchen diltiazem (CARDIZEM CD) 240 MG 24 hr capsule Take 1 capsule (240 mg total) by mouth daily.  Marland Kitchen ELIQUIS 5 MG TABS tablet TAKE 1 TABLET BY MOUTH TWICE DAILY  . ferrous sulfate 325 (65 FE) MG tablet Take by mouth as directed.  . furosemide (LASIX) 40 MG tablet Take 40 mg by mouth daily.  . hydrALAZINE (APRESOLINE) 25 MG  tablet Take 2 tablets (50 mg total) by mouth 2 (two) times daily. Take one in the afternoon and one in the early evening.  Marland Kitchen levothyroxine (SYNTHROID, LEVOTHROID) 150 MCG tablet Take 150 mcg by mouth daily before breakfast.   . Magnesium 250 MG TABS Take by mouth.  . ondansetron (ZOFRAN) 4 MG tablet Take 8 mg by mouth every 8 (eight) hours.  . pantoprazole (PROTONIX) 40 MG tablet Take 1 tablet (40 mg total) by mouth at bedtime.  . potassium chloride SA (K-DUR,KLOR-CON) 20 MEQ tablet Take 2 tablets (40 mEq total) by mouth daily.  . [DISCONTINUED] hydrALAZINE (APRESOLINE) 25 MG tablet Take 1  tablet (25 mg total) by mouth 2 (two) times daily. Take one in the afternoon and one in the early evening.     Allergies: Allergies  Allergen Reactions  . Ace Inhibitors Swelling and Other (See Comments)    ACE stopped after pt seen in ED with facial swelling- allergy testing pending  . Ambien [Zolpidem] Other (See Comments)    confusion  . Fentanyl Nausea And Vomiting and Other (See Comments)    PATCH     . Morphine And Related Other (See Comments)    Unknown  . Codeine Nausea And Vomiting    Social History: The patient  reports that she has never smoked. She has never used smokeless tobacco. She reports that she does not drink alcohol or use drugs.   Family History: The patient's family history includes CAD in her sister and son.   Review of Systems: Please see the history of present illness.   Otherwise, the review of systems is positive for none.   All other systems are reviewed and negative.   Physical Exam: VS:  BP (!) 150/70   Pulse 66   Ht 5\' 3"  (1.6 m)   Wt 158 lb 12.8 oz (72 kg)   LMP  (LMP Unknown)   SpO2 98%   BMI 28.13 kg/m  .  BMI Body mass index is 28.13 kg/m.  Wt Readings from Last 3 Encounters:  11/26/17 158 lb 12.8 oz (72 kg)  06/04/17 161 lb 12 oz (73.4 kg)  11/28/16 155 lb 12.8 oz (70.7 kg)   BP is 160/90 by me.   General: Pleasant. Well developed, well  nourished and in no acute distress.   HEENT: Normal.  Neck: Supple, no JVD, carotid bruits, or masses noted.  Cardiac: Regular rate and rhythm. No murmurs, rubs, or gallops. No edema.  Respiratory:  Lungs are clear to auscultation bilaterally with normal work of breathing.  GI: Soft and nontender.  MS: No deformity or atrophy. Gait and ROM intact.  Skin: Warm and dry. Color is normal. Lots of bruising over her extremities noted.  Neuro:  Strength and sensation are intact and no gross focal deficits noted.  Psych: Alert, appropriate and with normal affect.   LABORATORY DATA:  EKG:  EKG is ordered today. This shows NSR with anterolateral changes.   Lab Results  Component Value Date   WBC 7.8 10/02/2016   HGB 10.3 (L) 10/02/2016   HCT 30.7 (L) 10/02/2016   PLT 257 10/02/2016   GLUCOSE 92 10/02/2016   ALT 16 10/02/2016   AST 25 10/02/2016   NA 140 10/02/2016   K 4.0 10/02/2016   CL 101 10/02/2016   CREATININE 1.01 (H) 10/02/2016   BUN 10 10/02/2016   CO2 24 10/02/2016   TSH 4.010 10/02/2016   INR 1.03 09/05/2016   HGBA1C 5.9 (H) 05/30/2016     BNP (last 3 results) No results for input(s): BNP in the last 8760 hours.  ProBNP (last 3 results) No results for input(s): PROBNP in the last 8760 hours.   Other Studies Reviewed Today:  Echo Study Conclusions 07/2016  - Left ventricle: Systolic function was mildly to moderately reduced. The estimated ejection fraction was in the range of 40% to 45%. Diffuse hypokinesis. No evidence of thrombus. - Ventricular septum: Septal motion showed abnormal function and dyssynergy. - Mitral valve: There was mild regurgitation. - Left atrium: LAA clip intact with thrombus distal and no connection to LA by 2D or color flow. The atrium was dilated.  No evidence of thrombus in the atrial cavity or appendage. - Right atrium: The atrium was mildly dilated. - Atrial septum: There was increased thickness of the septum, consistent  with lipomatous hypertrophy. No defect or patent foramen ovale was identified. - Tricuspid valve: There was moderate regurgitation. - Impressions: DCC: Propofol Anesthesia Dr Leanord Hawking Single 120 J biphasic shock Converted from atrial flutter rate 122 to NSR rate 78 with PAC;s No immediate neurologic sequelae On Rx Eliquis On iv amiodarone with additional 150 mg bolus given due to frequent PAC;s  Impressions:  - DCC: Propofol Anesthesia Dr Leanord Hawking Single 120 J biphasic shock Converted from atrial flutter rate 122 to NSR rate 78 with PAC;s No immediate neurologic sequelae On Rx Eliquis On iv amiodarone with additional 150 mg bolus given due to frequent PAC;s Successful cardioversion. No cardiac source of emboli was indentified.  Assessment/Plan:  1. HTN - BP is not controlled. Will increase her Hydralazine to 50 mg BID. She will be getting a new cuff. May need to consider changing Diltiazem to Norvasc. Could also consider aldactone with reduced EF.   2. PAF - on amiodarone. Remains in NSR - needs surveillance labs. Need to consider sending for CXR on return.   3. Chronic anticoagulation - remains on Eliquis along with aspirin - lots of bruising noted - lab today.   4. Chronic systolic HF - for repeat echo next month. No ACE/ARB due to facial swelling - would not be candidate for Entresto either. She appears to be doing well from this standpoint.   5. CAD - prior CABG - she has no active symptoms. EKG remains abnormal.   6. Hypothyroidism - she needs TSH today - she is on amiodarone.   7. Prior back surgery with sepsis/epidural abscess and repeat surgery. She is trying to continue PT.   Current medicines are reviewed with the patient today.  The patient does not have concerns regarding medicines other than what has been noted above.  The following changes have been made:  See above.  Labs/ tests ordered today include:    Orders Placed This  Encounter  Procedures  . Basic metabolic panel  . CBC  . Hepatic function panel  . TSH  . EKG 12-Lead     Disposition:   FU with me in about 10 to 14 days. She will keep her followup for echo and OV with Dr. Eden Emms as planned in November.  Patient is agreeable to this plan and will call if any problems develop in the interim.   SignedNorma Fredrickson, NP  11/26/2017 11:24 AM  [facility] [address] [place], [place]  [address] Phone: [phone] Fax: [phone]

## 2017-12-05 ENCOUNTER — Telehealth: Payer: Self-pay | Admitting: Nurse Practitioner

## 2017-12-05 MED ORDER — DILTIAZEM HCL 60 MG PO TABS: 60.0000 mg | ORAL_TABLET | Freq: Four times a day (QID) | ORAL | 3 refills | Status: DC

## 2017-12-05 NOTE — Telephone Encounter (Signed)
Spoke to patient and informed her that Dr Eden Emms said that she could go back to Diltiazem 60 mg q6h.  She verbalized understanding and was thankful for the call.  She will update [place] on her physical status 10/21.

## 2017-12-05 NOTE — Telephone Encounter (Signed)
New message   Pt c/o medication issue:  1. Name of Medication: hydrALAZINE (APRESOLINE) 25 MG tablet  2. How are you currently taking this medication (dosage and times per day)? 2 in in the evening and 2 at night   3. Are you having a reaction (difficulty breathing--STAT)? No   4. What is your medication issue?Patient states that this medicine  may be reacting with the diltiazem (CARDIZEM CD) 240 MG 24 hr capsule. The patient states that she feels like she has the flu and has anxiety and nausea.

## 2017-12-05 NOTE — Telephone Encounter (Addendum)
Patient is calling to inform Erin [name] that she has felt lousy (nauseated, headache, loss of appetite and energy, and joints ache) since her recent medication changes.    On 9/24, she was told to stop Cardizem 60 mg q6h and start Cardizem 240 mg daily.  She also started hydralazine 25 mg bid 10/2.    On 10/8, her hydralazine was increased to 50 mg bid.  Her BP has remained elevated 161/86, 161/81, 164/80. HR has been in the 70s. She would like to go back to her Cardizem 60 mg q6h until her OV 10/21 with Lawson Fiscal to see if that helps with her symptoms.

## 2017-12-05 NOTE — Telephone Encounter (Signed)
Ok to go back to short acting cardizem

## 2017-12-09 ENCOUNTER — Encounter: Payer: Self-pay | Admitting: Nurse Practitioner

## 2017-12-09 ENCOUNTER — Ambulatory Visit (INDEPENDENT_AMBULATORY_CARE_PROVIDER_SITE_OTHER): Payer: Medicare HMO | Admitting: Nurse Practitioner

## 2017-12-09 ENCOUNTER — Ambulatory Visit
Admission: RE | Admit: 2017-12-09 | Discharge: 2017-12-09 | Disposition: A | Discharge status: Home / Self Care | Payer: Medicare HMO | Source: Ambulatory Visit | Attending: Nurse Practitioner | Admitting: Nurse Practitioner

## 2017-12-09 VITALS — BP 160/80 | HR 68 | Ht 65.0 in | Wt 160.1 lb

## 2017-12-09 DIAGNOSIS — R0602 Shortness of breath: Secondary | ICD-10-CM

## 2017-12-09 DIAGNOSIS — I480 Paroxysmal atrial fibrillation: Secondary | ICD-10-CM

## 2017-12-09 DIAGNOSIS — Z79899 Other long term (current) drug therapy: Secondary | ICD-10-CM | POA: Diagnosis not present

## 2017-12-09 DIAGNOSIS — I259 Chronic ischemic heart disease, unspecified: Secondary | ICD-10-CM | POA: Diagnosis not present

## 2017-12-09 DIAGNOSIS — I10 Essential (primary) hypertension: Secondary | ICD-10-CM

## 2017-12-09 MED ORDER — SPIRONOLACTONE 25 MG PO TABS: 25.0000 mg | ORAL_TABLET | Freq: Every day | ORAL | 3 refills | Status: DC

## 2017-12-09 MED ORDER — ATORVASTATIN CALCIUM 80 MG PO TABS: 80.0000 mg | ORAL_TABLET | Freq: Every day | ORAL | 3 refills | Status: AC

## 2017-12-09 MED ORDER — POTASSIUM CHLORIDE CRYS ER 20 MEQ PO TBCR: 20.0000 meq | EXTENDED_RELEASE_TABLET | Freq: Every day | ORAL | Status: DC

## 2017-12-09 NOTE — Progress Notes (Signed)
CARDIOLOGY OFFICE NOTE  Date:  12/09/2017    Erin [name] Date of Birth: 1945-08-14 Medical Record #[id number]  PCP:  Caffie Damme, MD  Cardiologist:  Townsend Roger  Chief Complaint  Patient presents with  . Hypertension    Follow up visit - seen for Dr. Eden Emms    History of Present Illness: Erin [name] is a 72 y.o. female who presents today for a 2 week check. Seen for Dr. Eden Emms.   She has a hx of CAD/CABG and PAF. She has had CABG by Dr Dorris Fetch 05/30/16 with LAA clipping. Her course was complicated by [facility] sepsis and paravertebral abscess post lumbar laminectomy from March os 2018 with revision by Dr Wynetta Emery 06/26/16. She had recurrent PAF - was placed back on Eliquis and wasloaded with amiodarone and has had TEE cardioversion 07/04/16 and there was thrombus in her LAA but clip was intact with no communication to her LA and no other thrombus. She went back into rapid atrial flutter with rates in the 120s. She was symptomatic with palpitations. She required repeat TEE cardioversion 07/2016 back to NSR. EF has dropped to 40-45% with moderate MR. She was readmitted towards the end of July by Dr. Wynetta Emery and had  "exploration of fusion removal of hardware L2-S1 with removal of bilateral loose L2 pedicle screws which were sent for culture and removal of rods and all top tightening nuts and cross-link". She did ok with this surgery. Tolerated ok.Followed by cardiology.   I saw her several times during that illness. Last seen by Dr. Eden Emms in April of 2019 and felt to be doing well. Echo has been arranged for next month to follow up her EF.    I saw her 2 weeks ago as a work in visit - BP was elevated. She had readings over 200 systolic. Probably getting more salt. She was using a wrist cuff as well. We increased her hydralazine. Could consider changing her Dilt to Norvasc.   Comes in today. Here alone. BP remains up. She does feel more short of breath - especially with activity  and with lying down at night. No chest discomfort. Using less salt. She did get a new cuff - correlates ok with ours here. For some reason, she is not taking her Lipitor - she does not know why. She has gone back to short acting Diltiazem and has cut her Hydralazine back as well - she made these changes just this past Friday due to feeling so bad - BP still high. No chest pain.   Past Medical History:  Diagnosis Date  . A-fib (HCC)   . Anxiety   . Arthritis    "all my back is eat up w/it; knees too" (05/24/2016)  . CAD (coronary artery disease)    s/p CABG  . Chronic bronchitis (HCC)   . Chronic lower back pain   . Depression   . Dyspnea    "since OR 04/2016" (05/24/2016)  . Dysrhythmia   . Family history of adverse reaction to anesthesia    "daughter gets PONV" (05/24/2016)  . GERD (gastroesophageal reflux disease)    occ  . High cholesterol   . History of stomach ulcers   . Hypertension   . Hypothyroidism   . Migraine    "usually have one monthly; nothing since 04/25/2016)  . Pneumonia ~ 2002    Past Surgical History:  Procedure Laterality Date  . ANTERIOR CERVICAL DECOMP/DISCECTOMY FUSION  ~ 2009  . BACK  SURGERY    . BACK SURGERY     JUNE  2018  . BLEPHAROPLASTY    . CARDIOVERSION N/A 07/04/2016   Procedure: CARDIOVERSION;  Surgeon: Wendall Stade, MD;  Location: [facility] ENDOSCOPY;  Service: Cardiovascular;  Laterality: N/A;  . CARDIOVERSION N/A 07/30/2016   Procedure: CARDIOVERSION;  Surgeon: Wendall Stade, MD;  Location: [facility] ENDOSCOPY;  Service: Cardiovascular;  Laterality: N/A;  . CARPAL TUNNEL RELEASE Bilateral 80's  . CATARACT EXTRACTION, BILATERAL    . CORONARY ARTERY BYPASS GRAFT N/A 05/30/2016   Procedure: CORONARY ARTERY BYPASS GRAFTING (CABG), ON PUMP, TIMES FOUR, USING LEFT INTERNAL MAMMARY ARTERY AND ENDOSCOPICALLY HARVESTED BILATERAL GREATER SAPHENOUS VEINS WITH CLIPPING OF LEFT ATRIAL APPENDAGE;  Surgeon: Loreli Slot, MD;  Location: [facility] OR;  Service: Open Heart  Surgery;  Laterality: N/A;  LIMA to LAD, SVG to RAMUS INTERMEDIATE, and SVG SEQUENTIALLY to CIRCUMFLEX and PDA  . IR FLUORO GUIDED NEEDLE PLC ASPIRATION/INJECTION LOC  07/12/2016  . KNEE ARTHROSCOPY Bilateral 2000s  . LEFT HEART CATH AND CORONARY ANGIOGRAPHY N/A 05/29/2016   Procedure: Left Heart Cath and Coronary Angiography;  Surgeon: Lennette Bihari, MD;  Location: [facility] INVASIVE CV LAB;  Service: Cardiovascular;  Laterality: N/A;  . LUMBAR DISC SURGERY  2006; 07/2005  . LUMBAR WOUND DEBRIDEMENT N/A 06/26/2016   Procedure: Incision and Drainage of LUMBAR WOUND;  Surgeon: Donalee Citrin, MD;  Location: [facility] OR;  Service: Neurosurgery;  Laterality: N/A;  . POSTERIOR LUMBAR FUSION  04/2016  . TEE WITHOUT CARDIOVERSION N/A 07/04/2016   Procedure: TRANSESOPHAGEAL ECHOCARDIOGRAM (TEE);  Surgeon: Wendall Stade, MD;  Location: [facility] ENDOSCOPY;  Service: Cardiovascular;  Laterality: N/A;  . TEE WITHOUT CARDIOVERSION N/A 07/30/2016   Procedure: TRANSESOPHAGEAL ECHOCARDIOGRAM (TEE);  Surgeon: Wendall Stade, MD;  Location: [facility] ENDOSCOPY;  Service: Cardiovascular;  Laterality: N/A;  . TONSILLECTOMY    . TUBAL LIGATION       Medications: Current Meds  Medication Sig  . acetaminophen (TYLENOL) 325 MG tablet Take 650 mg by mouth every 4 (four) hours as needed for mild pain or fever.  Marland Kitchen amiodarone (PACERONE) 200 MG tablet Take 1 tablet (200 mg total) by mouth daily.  Marland Kitchen aspirin EC 81 MG tablet Take 81 mg by mouth daily.  . Biotin 10 MG CAPS Take 10 mg by mouth daily.  . Calcium 250 MG CAPS Take by mouth as directed.  . cefdinir (OMNICEF) 300 MG capsule TAKE 1 CAPSULE BY MOUTH EVERY 12 HOURS WITH FOOD  . Cholecalciferol (VITAMIN D) 2000 units CAPS Take 2,000 Units by mouth daily after breakfast.  . cyclobenzaprine (FLEXERIL) 10 MG tablet Take 1 tablet (10 mg total) by mouth 3 (three) times daily as needed for muscle spasms.  . cycloSPORINE (RESTASIS) 0.05 % ophthalmic emulsion Place 1 drop into both eyes 2 (two) times  daily.  Marland Kitchen diltiazem (CARDIZEM) 60 MG tablet Take 1 tablet (60 mg total) by mouth 4 (four) times daily.  Marland Kitchen ELIQUIS 5 MG TABS tablet TAKE 1 TABLET BY MOUTH TWICE DAILY  . ferrous sulfate 325 (65 FE) MG tablet Take 325 mg by mouth as directed.   . furosemide (LASIX) 40 MG tablet Take 40 mg by mouth daily.  . hydrALAZINE (APRESOLINE) 25 MG tablet Take 25 mg by mouth 2 (two) times daily.  Marland Kitchen levothyroxine (SYNTHROID, LEVOTHROID) 150 MCG tablet Take 150 mcg by mouth daily before breakfast.   . Magnesium 250 MG TABS Take by mouth.  . ondansetron (ZOFRAN) 4 MG tablet Take 8 mg  by mouth every 8 (eight) hours.  . pantoprazole (PROTONIX) 40 MG tablet Take 1 tablet (40 mg total) by mouth at bedtime.  . potassium chloride SA (K-DUR,KLOR-CON) 20 MEQ tablet Take 1 tablet (20 mEq total) by mouth daily.  . [DISCONTINUED] potassium chloride SA (K-DUR,KLOR-CON) 20 MEQ tablet Take 2 tablets (40 mEq total) by mouth daily.     Allergies: Allergies  Allergen Reactions  . Ace Inhibitors Swelling and Other (See Comments)    ACE stopped after pt seen in ED with facial swelling- allergy testing pending  . Ambien [Zolpidem] Other (See Comments)    confusion  . Fentanyl Nausea And Vomiting and Other (See Comments)    PATCH     . Morphine And Related Other (See Comments)    Unknown  . Codeine Nausea And Vomiting    Social History: The patient  reports that she has never smoked. She has never used smokeless tobacco. She reports that she does not drink alcohol or use drugs.   Family History: The patient's family history includes CAD in her sister and son.   Review of Systems: Please see the history of present illness.   Otherwise, the review of systems is positive for none.   All other systems are reviewed and negative.   Physical Exam: VS:  BP (!) 160/80 (BP Location: Left Arm, Patient Position: Sitting, Cuff Size: Normal) Comment: 163/95 her cuff  Pulse 68   Ht 5\' 5"  (1.651 m)   Wt 160 lb 1.9 oz (72.6 kg)    LMP  (LMP Unknown)   SpO2 98% Comment: at rest  BMI 26.65 kg/m  .  BMI Body mass index is 26.65 kg/m.  Wt Readings from Last 3 Encounters:  12/09/17 160 lb 1.9 oz (72.6 kg)  11/26/17 158 lb 12.8 oz (72 kg)  06/04/17 161 lb 12 oz (73.4 kg)    General: Pleasant. Well developed, well nourished and in no acute distress.   HEENT: Normal.  Neck: Supple, no JVD, carotid bruits, or masses noted.  Cardiac: Regular rate and rhythm. No murmurs, rubs, or gallops. No edema.  Respiratory:  Lungs are clear to auscultation bilaterally with normal work of breathing.  GI: Soft and nontender.  MS: No deformity or atrophy. Gait and ROM intact.  Skin: Warm and dry. Color is normal.  Neuro:  Strength and sensation are intact and no gross focal deficits noted.  Psych: Alert, appropriate and with normal affect.   LABORATORY DATA:  EKG:  EKG is ordered today. This shows NSR - septal Q's - looks better today.   Lab Results  Component Value Date   WBC 5.5 11/26/2017   HGB 13.9 11/26/2017   HCT 39.8 11/26/2017   PLT 169 11/26/2017   GLUCOSE 87 11/26/2017   ALT 17 11/26/2017   AST 23 11/26/2017   NA 143 11/26/2017   K 4.6 11/26/2017   CL 100 11/26/2017   CREATININE 1.19 (H) 11/26/2017   BUN 13 11/26/2017   CO2 26 11/26/2017   TSH 1.460 11/26/2017   INR 1.03 09/05/2016   HGBA1C 5.9 (H) 05/30/2016     BNP (last 3 results) No results for input(s): BNP in the last 8760 hours.  ProBNP (last 3 results) No results for input(s): PROBNP in the last 8760 hours.   Other Studies Reviewed Today:  Echo Study Conclusions 07/2016  - Left ventricle: Systolic function was mildly to moderately reduced. The estimated ejection fraction was in the range of 40% to 45%. Diffuse hypokinesis. No  evidence of thrombus. - Ventricular septum: Septal motion showed abnormal function and dyssynergy. - Mitral valve: There was mild regurgitation. - Left atrium: LAA clip intact with thrombus distal and  no connection to LA by 2D or color flow. The atrium was dilated. No evidence of thrombus in the atrial cavity or appendage. - Right atrium: The atrium was mildly dilated. - Atrial septum: There was increased thickness of the septum, consistent with lipomatous hypertrophy. No defect or patent foramen ovale was identified. - Tricuspid valve: There was moderate regurgitation. - Impressions: DCC: Propofol Anesthesia Dr Leanord Hawking Single 120 J biphasic shock Converted from atrial flutter rate 122 to NSR rate 78 with PAC;s No immediate neurologic sequelae On Rx Eliquis On iv amiodarone with additional 150 mg bolus given due to frequent PAC;s  Impressions:  - DCC: Propofol Anesthesia Dr Leanord Hawking Single 120 J biphasic shock Converted from atrial flutter rate 122 to NSR rate 78 with PAC;s No immediate neurologic sequelae On Rx Eliquis On iv amiodarone with additional 150 mg bolus given due to frequent PAC;s Successful cardioversion. No cardiac source of emboli was indentified.  Assessment/Plan:  1. HTN - BP is still not controlled - she did not tolerate the changes we made 2 weeks ago - would hold on changing over to Norvasc with LV dysfunction. Will add Aldactone 25 mg a day. Lab today and repeat in one week. Cutting potassium back to just one a day.   2. Shortness of breath - will check CXR, send for PFTs - checking lab to include BNP. Will get her echo moved up as well.   3. PAF - on amiodarone. Remains in NSR - we did her surveillance labs - will send for CXR and PFTs  4. Chronic anticoagulation - remains on Eliquis along with aspirin - lab earlier this month stable.   5. Chronic systolic HF - No ACE/ARB due to facial swelling - would not be candidate for Entresto either. Now endorsing much more dyspnea - even at rest. Will see if we can move the up the visit for the echo. Checking BNP today as well.   6. CAD - prior CABG - she has no active  symptoms. EKG remains abnormal.   7. Hypothyroidism - we checked TSH at last visit - she remains on replacment per PCP  8. Prior back surgery with sepsis/epidural abscess and repeat surgery. She is trying to continue PT.   Current medicines are reviewed with the patient today.  The patient does not have concerns regarding medicines other than what has been noted above.  The following changes have been made:  See above.  Labs/ tests ordered today include:    Orders Placed This Encounter  Procedures  . DG Chest 2 View  . Basic metabolic panel  . Pro b natriuretic peptide (BNP)  . EKG 12-Lead  . Pulmonary function test     Disposition:   FU with me in about 10 days.    Patient is agreeable to this plan and will call if any problems develop in the interim.   SignedNorma Fredrickson, NP  12/09/2017 2:21 PM  [facility] [address] [place], [place]  [address] Phone: [phone] Fax: [phone]

## 2017-12-09 NOTE — Patient Instructions (Addendum)
We will be checking the following labs today - BNP and BMET  BMET in one week  Please go to [facility] to [facility] on the first floor for a chest Xray - you may walk in.    If you have labs (blood work) drawn today and your tests are completely normal, you will receive your results only by: Marland Kitchen MyChart Message (if you have MyChart) OR . A paper copy in the mail If you have any lab test that is abnormal or we need to change your treatment, we will call you to review the results.   Medication Instructions:    Continue with your current medicines. BUT  I am adding Aldactone 25 mg to take one a day - this is at the drug store  I am cutting your potassium back to just one pill a day - we may be able to stop this altogether based on repeat lab next week  Would restart the Lipitor - I have sent this to the drug store   If you need a refill on your cardiac medications before your next appointment, please call your pharmacy.     Testing/Procedures To Be Arranged:  Please try to move up echo appointment.   Follow-Up:   See me in 10 days  Dr. Eden Emms as planned next month.     At [facility], you and your health needs are our priority.  As part of our continuing mission to provide you with exceptional heart care, we have created designated Provider Care Teams.  These Care Teams include your primary Cardiologist (physician) and Advanced Practice Providers (APPs -  Physician Assistants and Nurse Practitioners) who all work together to provide you with the care you need, when you need it.  Special Instructions:  . PFTs with diffusion at [facility]  Call the [facility] office at [phone] if you have any questions, problems or concerns.

## 2017-12-10 LAB — BASIC METABOLIC PANEL
BUN/Creatinine Ratio: 15 (ref 12–28)
BUN: 17 mg/dL (ref 8–27)
CO2: 24 mmol/L (ref 20–29)
Calcium: 8.7 mg/dL (ref 8.7–10.3)
Chloride: 102 mmol/L (ref 96–106)
Creatinine, Ser: 1.15 mg/dL — ABNORMAL HIGH (ref 0.57–1.00)
GFR calc Af Amer: 54 mL/min/{1.73_m2} — ABNORMAL LOW (ref >59)
GFR calc non Af Amer: 47 mL/min/{1.73_m2} — ABNORMAL LOW (ref >59)
Glucose: 86 mg/dL (ref 65–99)
Potassium: 4.3 mmol/L (ref 3.5–5.2)
Sodium: 140 mmol/L (ref 134–144)

## 2017-12-10 LAB — PRO B NATRIURETIC PEPTIDE: NT-Pro BNP: 1728 pg/mL — ABNORMAL HIGH (ref 0–301)

## 2017-12-17 ENCOUNTER — Other Ambulatory Visit: Payer: Self-pay

## 2017-12-17 ENCOUNTER — Other Ambulatory Visit: Payer: Self-pay | Admitting: *Deleted

## 2017-12-17 ENCOUNTER — Ambulatory Visit (HOSPITAL_COMMUNITY)
Admission: RE | Admit: 2017-12-17 | Discharge: 2017-12-17 | Disposition: A | Discharge status: Home / Self Care | Payer: Medicare HMO | Source: Ambulatory Visit | Attending: Nurse Practitioner | Admitting: Nurse Practitioner

## 2017-12-17 ENCOUNTER — Other Ambulatory Visit: Payer: Medicare HMO

## 2017-12-17 ENCOUNTER — Ambulatory Visit (HOSPITAL_BASED_OUTPATIENT_CLINIC_OR_DEPARTMENT_OTHER): Payer: Medicare HMO

## 2017-12-17 DIAGNOSIS — I110 Hypertensive heart disease with heart failure: Secondary | ICD-10-CM | POA: Insufficient documentation

## 2017-12-17 DIAGNOSIS — I5022 Chronic systolic (congestive) heart failure: Secondary | ICD-10-CM

## 2017-12-17 DIAGNOSIS — Z79899 Other long term (current) drug therapy: Secondary | ICD-10-CM

## 2017-12-17 DIAGNOSIS — I071 Rheumatic tricuspid insufficiency: Secondary | ICD-10-CM | POA: Insufficient documentation

## 2017-12-17 DIAGNOSIS — Z951 Presence of aortocoronary bypass graft: Secondary | ICD-10-CM | POA: Insufficient documentation

## 2017-12-17 DIAGNOSIS — I4891 Unspecified atrial fibrillation: Secondary | ICD-10-CM | POA: Insufficient documentation

## 2017-12-17 DIAGNOSIS — E876 Hypokalemia: Secondary | ICD-10-CM

## 2017-12-17 DIAGNOSIS — I2510 Atherosclerotic heart disease of native coronary artery without angina pectoris: Secondary | ICD-10-CM | POA: Diagnosis not present

## 2017-12-17 DIAGNOSIS — R0602 Shortness of breath: Secondary | ICD-10-CM

## 2017-12-17 DIAGNOSIS — E785 Hyperlipidemia, unspecified: Secondary | ICD-10-CM | POA: Diagnosis not present

## 2017-12-17 LAB — PULMONARY FUNCTION TEST
DL/VA % pred: 80 %
DL/VA: 3.85 ml/min/mmHg/L
DLCO unc % pred: 58 %
DLCO unc: 14.31 ml/min/mmHg
FEF 25-75 Post: 3.01 L/sec
FEF 25-75 Pre: 2.7 L/sec
FEF2575-%Change-Post: 11 %
FEF2575-%Pred-Post: 176 %
FEF2575-%Pred-Pre: 158 %
FEV1-%Change-Post: 2 %
FEV1-%Pred-Post: 88 %
FEV1-%Pred-Pre: 86 %
FEV1-Post: 1.9 L
FEV1-Pre: 1.86 L
FEV1FVC-%Change-Post: 4 %
FEV1FVC-%Pred-Pre: 115 %
FEV6-%Change-Post: -2 %
FEV6-%Pred-Post: 77 %
FEV6-%Pred-Pre: 78 %
FEV6-Post: 2.09 L
FEV6-Pre: 2.14 L
FEV6FVC-%Pred-Post: 105 %
FEV6FVC-%Pred-Pre: 105 %
FVC-%Change-Post: -2 %
FVC-%Pred-Post: 73 %
FVC-%Pred-Pre: 75 %
FVC-Post: 2.09 L
FVC-Pre: 2.14 L
Post FEV1/FVC ratio: 91 %
Post FEV6/FVC ratio: 100 %
Pre FEV1/FVC ratio: 87 %
Pre FEV6/FVC Ratio: 100 %
RV % pred: 69 %
RV: 1.59 L
TLC % pred: 74 %
TLC: 3.77 L

## 2017-12-17 LAB — BASIC METABOLIC PANEL
BUN/Creatinine Ratio: 14 (ref 12–28)
BUN: 18 mg/dL (ref 8–27)
CO2: 23 mmol/L (ref 20–29)
Calcium: 9.3 mg/dL (ref 8.7–10.3)
Chloride: 98 mmol/L (ref 96–106)
Creatinine, Ser: 1.29 mg/dL — ABNORMAL HIGH (ref 0.57–1.00)
GFR calc Af Amer: 47 mL/min/{1.73_m2} — ABNORMAL LOW (ref >59)
GFR calc non Af Amer: 41 mL/min/{1.73_m2} — ABNORMAL LOW (ref >59)
Glucose: 83 mg/dL (ref 65–99)
Potassium: 4.8 mmol/L (ref 3.5–5.2)
Sodium: 136 mmol/L (ref 134–144)

## 2017-12-17 MED ORDER — ALBUTEROL SULFATE (2.5 MG/3ML) 0.083% IN NEBU
2.5000 mg | INHALATION_SOLUTION | Freq: Once | RESPIRATORY_TRACT | Status: AC
  Administered 2017-12-17: 2.5 mg via RESPIRATORY_TRACT

## 2017-12-17 NOTE — Progress Notes (Signed)
CARDIOLOGY OFFICE NOTE  Date:  12/18/2017    [name] Date of Birth: 1946/01/03 Medical Record #[id number]  PCP:  Caffie Damme, MD  Cardiologist:  Townsend Roger   Chief Complaint  Patient presents with  . Shortness of Breath    Follow up visit - seen for Dr. Eden Emms    History of Present Illness: JOYELL EMAMI is a 72 y.o. female who presents today for a follow up visit. Seen for Dr. Eden Emms.  She has a hx of CAD/CABG and PAF. She has had CABG by Dr Dorris Fetch 05/30/16 with LAA clipping.Hercourse wascomplicated by Ecoli sepsis and paravertebral abscess post lumbar laminectomyfromMarch os 2018with revision by Dr Wynetta Emery 06/26/16. She had recurrent PAF - was placedback on Eliquis and wasloaded with amiodarone and has had TEE cardioversion 07/04/16 and there was thrombus in her LAA but clip was intact with no communication to her LA and no other thrombus. She went back into rapid atrial flutter with rates in the 120s. She was symptomatic with palpitations. She required repeat TEE cardioversion 07/2016 back to NSR. EF has dropped to 40-45% with moderate MR. She was readmitted towards the end of July by Dr. Wynetta Emery and had"exploration of fusion removal of hardware L2-S1 with removal of bilateral loose L2 pedicle screws which were sent for culture and removal of rods and all top tightening nuts and cross-link".She did ok with this surgery.Tolerated ok.Followed by cardiology.  I saw her several times during that illness. Last seen by Dr. Eden Emms in April of 2019 and felt to be doing well. Echo had been arranged for November to follow up her EF.  I saw her earlier this month as a work in visit - BP was elevated. She had readings over 200 systolic. Probably getting more salt. She was using a wrist cuff as well. We increased her hydralazine.  On her follow up, BP was still up - she was endorsing more shortness of breath - she had changed her medicines back (went back to short  acting CCB and cut Hydralazine back) - was not on her Lipitor - unclear as to why. I got her echo moved up and arranged for PFTs. Also started her on Aldactone.   Comes in today. Herewith her son Tawanna Cooler. BP is trending down. Her PTFs are abnormal. We have arranged for pulmonary to see her this Friday. She remains on low dose amiodarone. Her shortness of breath is typically with exertion. She has had significant smoke/cigarette exposure. No dust/fume exposure that she is aware of. No chest pain. Her echo looked good. She is no longer on her potassium.   Past Medical History:  Diagnosis Date  . A-fib (HCC)   . Anxiety   . Arthritis    "all my back is eat up w/it; knees too" (05/24/2016)  . CAD (coronary artery disease)    s/p CABG  . Chronic bronchitis (HCC)   . Chronic lower back pain   . Depression   . Dyspnea    "since OR 04/2016" (05/24/2016)  . Dysrhythmia   . Family history of adverse reaction to anesthesia    "daughter gets PONV" (05/24/2016)  . GERD (gastroesophageal reflux disease)    occ  . High cholesterol   . History of stomach ulcers   . Hypertension   . Hypothyroidism   . Migraine    "usually have one monthly; nothing since 04/25/2016)  . Pneumonia ~ 2002    Past Surgical History:  Procedure Laterality Date  .  ANTERIOR CERVICAL DECOMP/DISCECTOMY FUSION  ~ 2009  . BACK SURGERY    . BACK SURGERY     JUNE  2018  . BLEPHAROPLASTY    . CARDIOVERSION N/A 07/04/2016   Procedure: CARDIOVERSION;  Surgeon: Wendall Stade, MD;  Location: [facility] ENDOSCOPY;  Service: Cardiovascular;  Laterality: N/A;  . CARDIOVERSION N/A 07/30/2016   Procedure: CARDIOVERSION;  Surgeon: Wendall Stade, MD;  Location: [facility] ENDOSCOPY;  Service: Cardiovascular;  Laterality: N/A;  . CARPAL TUNNEL RELEASE Bilateral 80's  . CATARACT EXTRACTION, BILATERAL    . CORONARY ARTERY BYPASS GRAFT N/A 05/30/2016   Procedure: CORONARY ARTERY BYPASS GRAFTING (CABG), ON PUMP, TIMES FOUR, USING LEFT INTERNAL MAMMARY ARTERY  AND ENDOSCOPICALLY HARVESTED BILATERAL GREATER SAPHENOUS VEINS WITH CLIPPING OF LEFT ATRIAL APPENDAGE;  Surgeon: Loreli Slot, MD;  Location: [facility] OR;  Service: Open Heart Surgery;  Laterality: N/A;  LIMA to LAD, SVG to RAMUS INTERMEDIATE, and SVG SEQUENTIALLY to CIRCUMFLEX and PDA  . IR FLUORO GUIDED NEEDLE PLC ASPIRATION/INJECTION LOC  07/12/2016  . KNEE ARTHROSCOPY Bilateral 2000s  . LEFT HEART CATH AND CORONARY ANGIOGRAPHY N/A 05/29/2016   Procedure: Left Heart Cath and Coronary Angiography;  Surgeon: Lennette Bihari, MD;  [facility] INVASIVE CV LAB;  Service: Cardiovascular;  Laterality: N/A;  . LUMBAR DISC SURGERY  2006; 07/2005  . LUMBAR WOUND DEBRIDEMENT N/A 06/26/2016   Procedure: Incision and Drainage of LUMBAR WOUND;  Surgeon: Donalee Citrin, MD;  Location: [facility] OR;  Service: Neurosurgery;  Laterality: N/A;  . POSTERIOR LUMBAR FUSION  04/2016  . TEE WITHOUT CARDIOVERSION N/A 07/04/2016   Procedure: TRANSESOPHAGEAL ECHOCARDIOGRAM (TEE);  Surgeon: Wendall Stade, MD;  Location: [facility] ENDOSCOPY;  Service: Cardiovascular;  Laterality: N/A;  . TEE WITHOUT CARDIOVERSION N/A 07/30/2016   Procedure: TRANSESOPHAGEAL ECHOCARDIOGRAM (TEE);  Surgeon: Wendall Stade, MD;  Location: [facility] ENDOSCOPY;  Service: Cardiovascular;  Laterality: N/A;  . TONSILLECTOMY    . TUBAL LIGATION       Medications: Current Meds  Medication Sig  . acetaminophen (TYLENOL) 325 MG tablet Take 650 mg by mouth every 4 (four) hours as needed for mild pain or fever.  Marland Kitchen aspirin EC 81 MG tablet Take 81 mg by mouth daily.  Marland Kitchen atorvastatin (LIPITOR) 80 MG tablet Take 1 tablet (80 mg total) by mouth daily.  . Biotin 10 MG CAPS Take 10 mg by mouth daily.  . Calcium 250 MG CAPS Take by mouth as directed.  . cefdinir (OMNICEF) 300 MG capsule TAKE 1 CAPSULE BY MOUTH EVERY 12 HOURS WITH FOOD  . Cholecalciferol (VITAMIN D) 2000 units CAPS Take 2,000 Units by mouth daily after breakfast.  . cyclobenzaprine (FLEXERIL) 10 MG tablet Take 1  tablet (10 mg total) by mouth 3 (three) times daily as needed for muscle spasms.  . cycloSPORINE (RESTASIS) 0.05 % ophthalmic emulsion Place 1 drop into both eyes 2 (two) times daily.  Marland Kitchen diltiazem (CARDIZEM) 60 MG tablet Take 1 tablet (60 mg total) by mouth 4 (four) times daily.  Marland Kitchen ELIQUIS 5 MG TABS tablet TAKE 1 TABLET BY MOUTH TWICE DAILY  . ferrous sulfate 325 (65 FE) MG tablet Take 325 mg by mouth as directed.   . furosemide (LASIX) 40 MG tablet Take 40 mg by mouth daily.  . hydrALAZINE (APRESOLINE) 25 MG tablet Take 25 mg by mouth 2 (two) times daily.  Marland Kitchen levothyroxine (SYNTHROID, LEVOTHROID) 137 MCG tablet Take 137 mcg by mouth daily.  . Magnesium 250 MG TABS Take by mouth.  . ondansetron (  ZOFRAN) 4 MG tablet Take 8 mg by mouth every 8 (eight) hours.  . pantoprazole (PROTONIX) 40 MG tablet Take 1 tablet (40 mg total) by mouth at bedtime.  Marland Kitchen spironolactone (ALDACTONE) 25 MG tablet Take 1 tablet (25 mg total) by mouth daily.  . [DISCONTINUED] amiodarone (PACERONE) 200 MG tablet Take 1 tablet (200 mg total) by mouth daily.     Allergies: Allergies  Allergen Reactions  . Ace Inhibitors Swelling and Other (See Comments)    ACE stopped after pt seen in ED with facial swelling- allergy testing pending  . Ambien [Zolpidem] Other (See Comments)    confusion  . Fentanyl Nausea And Vomiting and Other (See Comments)    PATCH     . Morphine And Related Other (See Comments)    Unknown  . Codeine Nausea And Vomiting    Social History: The patient  reports that she has never smoked. She has never used smokeless tobacco. She reports that she does not drink alcohol or use drugs.   Family History: The patient's family history includes CAD in her sister and son.   Review of Systems: Please see the history of present illness.   Otherwise, the review of systems is positive for none.   All other systems are reviewed and negative.   Physical Exam: VS:  BP (!) 146/78 (BP Location: Left Arm,  Patient Position: Sitting, Cuff Size: Normal)   Pulse 69   Ht 5\' 4"  (1.626 m)   Wt 158 lb 12.8 oz (72 kg)   LMP  (LMP Unknown)   SpO2 96% Comment: at rest  BMI 27.26 kg/m  .  BMI Body mass index is 27.26 kg/m.  Wt Readings from Last 3 Encounters:  12/18/17 158 lb 12.8 oz (72 kg)  12/09/17 160 lb 1.9 oz (72.6 kg)  11/26/17 158 lb 12.8 oz (72 kg)    General: Pleasant. Well developed, well nourished and in no acute distress.   HEENT: Normal.  Neck: Supple, no JVD, carotid bruits, or masses noted.  Cardiac: Regular rate and rhythm. No murmurs, rubs, or gallops. No edema.  Respiratory:  Lungs are clear to auscultation bilaterally with normal work of breathing.  GI: Soft and nontender.  MS: No deformity or atrophy. Gait and ROM intact.  Skin: Warm and dry. Color is normal.  Neuro:  Strength and sensation are intact and no gross focal deficits noted.  Psych: Alert, appropriate and with normal affect.   LABORATORY DATA:  EKG:  EKG is not ordered today.   Lab Results  Component Value Date   WBC 5.5 11/26/2017   HGB 13.9 11/26/2017   HCT 39.8 11/26/2017   PLT 169 11/26/2017   GLUCOSE 83 12/17/2017   ALT 17 11/26/2017   AST 23 11/26/2017   NA 136 12/17/2017   K 4.8 12/17/2017   CL 98 12/17/2017   CREATININE 1.29 (H) 12/17/2017   BUN 18 12/17/2017   CO2 23 12/17/2017   TSH 1.460 11/26/2017   INR 1.03 09/05/2016   HGBA1C 5.9 (H) 05/30/2016     BNP (last 3 results) No results for input(s): BNP in the last 8760 hours.  ProBNP (last 3 results) Recent Labs    12/09/17 1441  PROBNP 1,728*     Other Studies Reviewed Today:  CXR IMPRESSION: 1. Stable mild. 2. 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 No active lung disease.   Electronically Signed   By: Dwyane Dee M.D.   On: 12/10/2017 08:17   Echo Study  Conclusions 11/2017  - Left ventricle: The cavity size was normal. Wall thickness was   increased in a pattern of moderate LVH. Systolic  function was   normal. The estimated ejection fraction was in the range of 60%   to 65%. Wall motion was normal; there were no regional wall   motion abnormalities. Doppler parameters are consistent with   abnormal left ventricular relaxation (grade 1 diastolic   dysfunction). - Pulmonary arteries: PA peak pressure: 32 mm Hg (S).  Notes recorded by Wendall Stade, MD on 12/17/2017 at 5:35 PM EDT EF normal no significant valve disease overall good   Assessment/Plan:  1.Shortness of breath - abnormal PFT's with diffusion defect - she is on amiodarone - I referred her already to pulmonary - seeing Dr. Delton Coombes Friday. Her EF is normal by recent echo. She does have an elevated diaphragm. Discussed with Dr. Anne Fu here ([facility]) - will stop amiodarone today. Further disposition to follow.   2. HTN - her BP has trended down considerably. Will follow for now. No changes made today.   3. PAF - on amiodarone. Remains in NSR - see #1 - stopping this today.   4. Chronic anticoagulation - remains on Eliquis along with aspirin - lab earlier this month stable.   5. Chronic systolic HF - No ACE/ARB due to facial swelling - would not be candidate for Entresto either. EF is normal by echo from yesterday.   6. CAD - prior CABG - she has had no active symptoms. EKG at prior visits remains abnormal. Would hold on further testing until seen by pulmonary. We see her back in just a few weeks.   7.Hypothyroidism -she remains on replacment per PCP  8. Prior back surgery with sepsis/epidural abscess and repeat surgery. She is trying to continue PT.Limited by her DOE.   Current medicines are reviewed with the patient today.  The patient does not have concerns regarding medicines other than what has been noted above.  The following changes have been made:  See above.  Labs/ tests ordered today include:   No orders of the defined types were placed in this encounter.    Disposition:   FU with Dr.  Eden Emms as planned in November. Seeing pulmonary on Friday.   Patient is agreeable to this plan and will call if any problems develop in the interim.   SignedNorma Fredrickson, NP  12/18/2017 2:33 PM  [facility] [address] [place], [place]  [address] Phone: [phone] Fax: [phone]

## 2017-12-18 ENCOUNTER — Ambulatory Visit (INDEPENDENT_AMBULATORY_CARE_PROVIDER_SITE_OTHER): Payer: Medicare HMO | Admitting: Nurse Practitioner

## 2017-12-18 ENCOUNTER — Encounter: Payer: Self-pay | Admitting: Nurse Practitioner

## 2017-12-18 VITALS — BP 146/78 | HR 69 | Ht 64.0 in | Wt 158.8 lb

## 2017-12-18 DIAGNOSIS — R0602 Shortness of breath: Secondary | ICD-10-CM | POA: Diagnosis not present

## 2017-12-18 DIAGNOSIS — I480 Paroxysmal atrial fibrillation: Secondary | ICD-10-CM

## 2017-12-18 DIAGNOSIS — I259 Chronic ischemic heart disease, unspecified: Secondary | ICD-10-CM

## 2017-12-18 DIAGNOSIS — R942 Abnormal results of pulmonary function studies: Secondary | ICD-10-CM

## 2017-12-18 DIAGNOSIS — I10 Essential (primary) hypertension: Secondary | ICD-10-CM

## 2017-12-18 DIAGNOSIS — Z79899 Other long term (current) drug therapy: Secondary | ICD-10-CM | POA: Diagnosis not present

## 2017-12-18 NOTE — Patient Instructions (Addendum)
We will be checking the following labs today - NONE  Lab next week as planned.     Medication Instructions:    Continue with your current medicines. BUT  I am stopping amiodarone today   If you need a refill on your cardiac medications before your next appointment, please call your pharmacy.     Testing/Procedures To Be Arranged:  N/A  Follow-Up:   See Dr. Eden Emms as planned next month  See Pulmonary on Friday    At [facility], you and your health needs are our priority.  As part of our continuing mission to provide you with exceptional heart care, we have created designated Provider Care Teams.  These Care Teams include your primary Cardiologist (physician) and Advanced Practice Providers (APPs -  Physician Assistants and Nurse Practitioners) who all work together to provide you with the care you need, when you need it.  Special Instructions:  . None  Call the [facility] office at [phone] if you have any questions, problems or concerns.

## 2017-12-20 ENCOUNTER — Encounter: Payer: Self-pay | Admitting: Emergency Medicine

## 2017-12-20 ENCOUNTER — Ambulatory Visit (INDEPENDENT_AMBULATORY_CARE_PROVIDER_SITE_OTHER): Payer: Medicare HMO | Admitting: Emergency Medicine

## 2017-12-20 VITALS — BP 124/70 | HR 69 | Ht 64.0 in | Wt 156.8 lb

## 2017-12-20 DIAGNOSIS — R0600 Dyspnea, unspecified: Secondary | ICD-10-CM | POA: Insufficient documentation

## 2017-12-20 DIAGNOSIS — R0609 Other forms of dyspnea: Secondary | ICD-10-CM

## 2017-12-20 DIAGNOSIS — Z23 Encounter for immunization: Secondary | ICD-10-CM | POA: Diagnosis not present

## 2017-12-20 DIAGNOSIS — R892 Abnormal level of other drugs, medicaments and biological substances in specimens from other organs, systems and tissues: Secondary | ICD-10-CM | POA: Diagnosis not present

## 2017-12-20 NOTE — Progress Notes (Signed)
Subjective:    Patient ID: Erin [name], female    DOB: 1945/04/08, 72 y.o.   MRN: [id number]  HPI 72 year old never smoker with history of atrial fibrillation, CAD/CABG and LAA clip (4/18), hypertension, GERD, hypothyroidism, osteoarthritis.  Her hospitalization for CABG was complicated by E. coli sepsis and a paravertebral abscess post lumbar laminectomy and revision later in July.  Her blood pressure has been labile and she had difficulty with rhythm control, rate control since her surgery.  She had been treated with amiodarone.  She complained of dyspnea on exertion that became noticeable when she restarted exercise after the surgeries, also with household chores. It was associated with significant HTN w exercise. In light of the dyspnea, PFT below her amiodarone has been held.   Not coughing. Occasional wheeze when she lays down.   Pulmonary function testing was done on 12/17/2017 which I reviewed.  This shows spirometry most consistent with restriction, no bronchodilator response.  Her lung volumes confirm restriction.  Her diffusion capacity is decreased but corrects to the normal range when adjusted for her alveolar volume.  Chest x-ray done on 12/10/2017 shows no evidence of infiltrate, she has a chronically elevated right hemidiaphragm and evidence for her spinal fusion hardware.  TTE 12/17/17 >> intact LV fxn 65%, normal RV size and fxn. > reassuring.    Review of Systems  Constitutional: Negative for fever and unexpected weight change.  HENT: Positive for postnasal drip, sinus pressure and trouble swallowing. Negative for congestion, dental problem, ear pain, nosebleeds, rhinorrhea, sneezing and sore throat.   Eyes: Negative for redness and itching.  Respiratory: Positive for shortness of breath. Negative for cough, chest tightness and wheezing.   Cardiovascular: Negative for palpitations and leg swelling.  Gastrointestinal: Positive for nausea. Negative for vomiting.    Genitourinary: Negative for dysuria.  Musculoskeletal: Negative for joint swelling.  Skin: Negative for rash.  Allergic/Immunologic: Positive for environmental allergies. Negative for food allergies and immunocompromised state.  Neurological: Positive for headaches.  Hematological: Bruises/bleeds easily.  Psychiatric/Behavioral: Negative for dysphoric mood. The patient is not nervous/anxious.    Past Medical History:  Diagnosis Date  . A-fib (HCC)   . Anxiety   . Arthritis    "all my back is eat up w/it; knees too" (05/24/2016)  . CAD (coronary artery disease)    s/p CABG  . Chronic bronchitis (HCC)   . Chronic lower back pain   . Depression   . Dyspnea    "since OR 04/2016" (05/24/2016)  . Dysrhythmia   . Family history of adverse reaction to anesthesia    "daughter gets PONV" (05/24/2016)  . GERD (gastroesophageal reflux disease)    occ  . High cholesterol   . History of stomach ulcers   . Hypertension   . Hypothyroidism   . Migraine    "usually have one monthly; nothing since 04/25/2016)  . Pneumonia ~ 2002     Family History  Problem Relation Age of Onset  . CAD Sister        hx of CABG  . CAD Son        hx of CABG     Social History   Socioeconomic History  . Marital status: Divorced    Spouse name: Not on file  . Number of children: Not on file  . Years of education: Not on file  . Highest education level: Not on file  Occupational History  . Not on file  Social Needs  . Financial resource strain:  Not on file  . Food insecurity:    Worry: Not on file    Inability: Not on file  . Transportation needs:    Medical: Not on file    Non-medical: Not on file  Tobacco Use  . Smoking status: Never Smoker  . Smokeless tobacco: Never Used  Substance and Sexual Activity  . Alcohol use: No  . Drug use: No  . Sexual activity: Not Currently  Lifestyle  . Physical activity:    Days per week: Not on file    Minutes per session: Not on file  . Stress: Not on file   Relationships  . Social connections:    Talks on phone: Not on file    Gets together: Not on file    Attends religious service: Not on file    Active member of club or organization: Not on file    Attends meetings of clubs or organizations: Not on file    Relationship status: Not on file  . Intimate partner violence:    Fear of current or ex partner: Not on file    Emotionally abused: Not on file    Physically abused: Not on file    Forced sexual activity: Not on file  Other Topics Concern  . Not on file  Social History Narrative  . Not on file  non smoker, second hand cigarettes smoke.  Has worked in [employer], court system.   Allergies  Allergen Reactions  . Ace Inhibitors Swelling and Other (See Comments)    ACE stopped after pt seen in ED with facial swelling- allergy testing pending  . Ambien [Zolpidem] Other (See Comments)    confusion  . Fentanyl Nausea And Vomiting and Other (See Comments)    PATCH     . Morphine And Related Other (See Comments)    Unknown  . Codeine Nausea And Vomiting     Outpatient Medications Prior to Visit  Medication Sig Dispense Refill  . acetaminophen (TYLENOL) 325 MG tablet Take 650 mg by mouth every 4 (four) hours as needed for mild pain or fever.    Marland Kitchen aspirin EC 81 MG tablet Take 81 mg by mouth daily.    Marland Kitchen atorvastatin (LIPITOR) 80 MG tablet Take 1 tablet (80 mg total) by mouth daily. 90 tablet 3  . Biotin 10 MG CAPS Take 10 mg by mouth daily.    . Calcium 250 MG CAPS Take by mouth as directed.    . cefdinir (OMNICEF) 300 MG capsule TAKE 1 CAPSULE BY MOUTH EVERY 12 HOURS WITH FOOD  0  . Cholecalciferol (VITAMIN D) 2000 units CAPS Take 2,000 Units by mouth daily after breakfast.    . cyclobenzaprine (FLEXERIL) 10 MG tablet Take 1 tablet (10 mg total) by mouth 3 (three) times daily as needed for muscle spasms. 30 tablet 0  . cycloSPORINE (RESTASIS) 0.05 % ophthalmic emulsion Place 1 drop into both eyes 2 (two) times daily.    Marland Kitchen diltiazem  (CARDIZEM) 60 MG tablet Take 1 tablet (60 mg total) by mouth 4 (four) times daily. 90 tablet 3  . ELIQUIS 5 MG TABS tablet TAKE 1 TABLET BY MOUTH TWICE DAILY 60 tablet 6  . ferrous sulfate 325 (65 FE) MG tablet Take 325 mg by mouth as directed.     . furosemide (LASIX) 40 MG tablet Take 40 mg by mouth daily.    . hydrALAZINE (APRESOLINE) 25 MG tablet Take 25 mg by mouth 2 (two) times daily.    Marland Kitchen levothyroxine (  SYNTHROID, LEVOTHROID) 137 MCG tablet Take 137 mcg by mouth daily.  3  . Magnesium 250 MG TABS Take by mouth.    . ondansetron (ZOFRAN) 4 MG tablet Take 8 mg by mouth every 8 (eight) hours.    . pantoprazole (PROTONIX) 40 MG tablet Take 1 tablet (40 mg total) by mouth at bedtime. 30 tablet 0  . spironolactone (ALDACTONE) 25 MG tablet Take 1 tablet (25 mg total) by mouth daily. 90 tablet 3   No facility-administered medications prior to visit.         Objective:   Physical Exam Vitals:   12/20/17 0935  BP: 124/70  Pulse: 69  SpO2: 95%  Weight: 156 lb 12.8 oz (71.1 kg)  Height: 5\' 4"  (1.626 m)   Gen: Pleasant, well-nourished, in no distress,  normal affect  ENT: No lesions,  mouth clear,  oropharynx clear, no postnasal drip  Neck: No JVD, no stridor  Lungs: No use of accessory muscles, no crackles, good air movement both sides  Cardiovascular: RRR, heart sounds normal, no murmur or gallops, no peripheral edema  Musculoskeletal: No deformities, no cyanosis or clubbing  Neuro: alert, non focal  Skin: Warm, no lesions or rash      Assessment & Plan:  Dyspnea on exertion With restrictive disease on pulmonary function testing.  I suspect that her dyspnea is due to deconditioning and her superimposed (presumed new) right hemidiaphragm elevation.  Agree that we need to rule out primary cardiac or pulmonary process.  She has been on amiodarone as above.  This was stopped 2 days ago, unclear as to whether she would need to be back on it chronically since her dysrhythmia was  in the setting of acute hospitalization and illness.  All the same I think will be beneficial to prove or disprove whether there is any interstitial lung disease consistent with amiodarone toxicity.  This might influence whether she could ever be on the medication again.  I would expect that any subtle interstitial disease would resolve off the medication.  We will also arrange for a sniff test to evaluate for right hemi-diaphragm paralysis versus simple elevation.  If no evidence for coronary disease or parenchymal pulmonary disease and I think we can ascribe this to deconditioning and she can continue to work on her rehab and physical therapy.    Levy Pupa, MD, PhD 12/20/2017, 10:16 AM [facility] [phone] or if no answer [phone]

## 2017-12-20 NOTE — Assessment & Plan Note (Signed)
With restrictive disease on pulmonary function testing.  I suspect that her dyspnea is due to deconditioning and her superimposed (presumed new) right hemidiaphragm elevation.  Agree that we need to rule out primary cardiac or pulmonary process.  She has been on amiodarone as above.  This was stopped 2 days ago, unclear as to whether she would need to be back on it chronically since her dysrhythmia was in the setting of acute hospitalization and illness.  All the same I think will be beneficial to prove or disprove whether there is any interstitial lung disease consistent with amiodarone toxicity.  This might influence whether she could ever be on the medication again.  I would expect that any subtle interstitial disease would resolve off the medication.  We will also arrange for a sniff test to evaluate for right hemi-diaphragm paralysis versus simple elevation.  If no evidence for coronary disease or parenchymal pulmonary disease and I think we can ascribe this to deconditioning and she can continue to work on her rehab and physical therapy.

## 2017-12-20 NOTE — Patient Instructions (Addendum)
Agree with stopping amiodarone for now.  We will perform a CT scan of the chest without contrast.  We will perform a Sniff test Agree with continuing to work on your exercise and conditioning as long as cleared by Cardiology to do so.  Follow with Dr Delton Coombes next available to review your CT scan.

## 2017-12-24 ENCOUNTER — Ambulatory Visit (HOSPITAL_COMMUNITY)
Admission: RE | Admit: 2017-12-24 | Discharge: 2017-12-24 | Disposition: A | Discharge status: Home / Self Care | Payer: Medicare HMO | Source: Ambulatory Visit | Attending: Emergency Medicine | Admitting: Emergency Medicine

## 2017-12-24 DIAGNOSIS — R0609 Other forms of dyspnea: Secondary | ICD-10-CM | POA: Insufficient documentation

## 2017-12-24 DIAGNOSIS — R0600 Dyspnea, unspecified: Secondary | ICD-10-CM

## 2017-12-25 ENCOUNTER — Ambulatory Visit (INDEPENDENT_AMBULATORY_CARE_PROVIDER_SITE_OTHER)
Admission: RE | Admit: 2017-12-25 | Discharge: 2017-12-25 | Disposition: A | Discharge status: Home / Self Care | Payer: Medicare HMO | Source: Ambulatory Visit | Attending: Emergency Medicine | Admitting: Emergency Medicine

## 2017-12-25 ENCOUNTER — Other Ambulatory Visit: Payer: Medicare HMO | Admitting: *Deleted

## 2017-12-25 DIAGNOSIS — R0609 Other forms of dyspnea: Secondary | ICD-10-CM

## 2017-12-25 DIAGNOSIS — R892 Abnormal level of other drugs, medicaments and biological substances in specimens from other organs, systems and tissues: Secondary | ICD-10-CM

## 2017-12-25 DIAGNOSIS — E876 Hypokalemia: Secondary | ICD-10-CM

## 2017-12-25 DIAGNOSIS — R0600 Dyspnea, unspecified: Secondary | ICD-10-CM

## 2017-12-25 NOTE — Progress Notes (Signed)
CARDIOLOGY OFFICE NOTE  Date:  01/02/2018    Erin [name] Date of Birth: 19-Jun-1945 Medical Record #[id number]  PCP:  Caffie Damme, MD  Cardiologist:  Townsend Roger   No chief complaint on file.   History of Present Illness: Erin [name] is a 72 y.o. female f/U CAD/CABG, PAF, HTN, Dyspnea and lung disease   SheShe has had CABG by Dr Dorris Fetch 05/30/16 with LAA clipping.Hercourse wascomplicated by Ecoli sepsis and paravertebral abscess post lumbar laminectomyfromMarch os 2018with revision by Dr Wynetta Emery 06/26/16. She had recurrent PAF - was placedback on Eliquis and wasloaded with amiodarone and has had TEE cardioversion 07/04/16 and there was thrombus in her LAA but clip was intact with no communication to her LA and no other thrombus. She went back into rapid atrial flutter with rates in the 120s. She was symptomatic with palpitations. She required repeat TEE cardioversion 07/2016 back to NSR. EF has dropped to 40-45% with moderate MR. She was readmitted towards the end of July by Dr. Wynetta Emery and had"exploration of fusion removal of hardware L2-S1 with removal of bilateral loose L2 pedicle screws which were sent for culture and removal of rods and all top tightening nuts and cross-link".She did ok with this surgery.Tolerated ok.Followed by cardiology.  Been seen for elevated BP with addition of CCB and Hydralazine. More dyspnea. TTE 12/17/17 EF 60-65% grade one diastolic No pulmonary HTN.  BM{ 1728 PFT;s 12/17/17 with restriction and DLCO corrects for alveolar volume CT with small vessel disease Elevated right hemi diaphragm and no ILD  Dr Delton Coombes saw on 12/20/17 and felt dyspnea from deconditioning and presumed new right hemidiaphragm elevation. Sniff test was not diagnostic of paralysis but had decreased inferior excursion   Doing some better. Home BP's ok Tried SA cardizem but BP better on q 6 and she is compliant Needs to get back to rehab     Past Medical History:   Diagnosis Date  . A-fib (HCC)   . Anxiety   . Arthritis    "all my back is eat up w/it; knees too" (05/24/2016)  . CAD (coronary artery disease)    s/p CABG  . Chronic bronchitis (HCC)   . Chronic lower back pain   . Depression   . Dyspnea    "since OR 04/2016" (05/24/2016)  . Dysrhythmia   . Family history of adverse reaction to anesthesia    "daughter gets PONV" (05/24/2016)  . GERD (gastroesophageal reflux disease)    occ  . High cholesterol   . History of stomach ulcers   . Hypertension   . Hypothyroidism   . Migraine    "usually have one monthly; nothing since 04/25/2016)  . Pneumonia ~ 2002    Past Surgical History:  Procedure Laterality Date  . ANTERIOR CERVICAL DECOMP/DISCECTOMY FUSION  ~ 2009  . BACK SURGERY    . BACK SURGERY     JUNE  2018  . BLEPHAROPLASTY    . CARDIOVERSION N/A 07/04/2016   Procedure: CARDIOVERSION;  Surgeon: Wendall Stade, MD;  Location: [facility] ENDOSCOPY;  Service: Cardiovascular;  Laterality: N/A;  . CARDIOVERSION N/A 07/30/2016   Procedure: CARDIOVERSION;  Surgeon: Wendall Stade, MD;  Location: [facility] ENDOSCOPY;  Service: Cardiovascular;  Laterality: N/A;  . CARPAL TUNNEL RELEASE Bilateral 80's  . CATARACT EXTRACTION, BILATERAL    . CORONARY ARTERY BYPASS GRAFT N/A 05/30/2016   Procedure: CORONARY ARTERY BYPASS GRAFTING (CABG), ON PUMP, TIMES FOUR, USING LEFT INTERNAL MAMMARY ARTERY AND ENDOSCOPICALLY HARVESTED BILATERAL  GREATER SAPHENOUS VEINS WITH CLIPPING OF LEFT ATRIAL APPENDAGE;  Surgeon: Loreli Slot, MD;  Location: [facility] OR;  Service: Open Heart Surgery;  Laterality: N/A;  LIMA to LAD, SVG to RAMUS INTERMEDIATE, and SVG SEQUENTIALLY to CIRCUMFLEX and PDA  . IR FLUORO GUIDED NEEDLE PLC ASPIRATION/INJECTION LOC  07/12/2016  . KNEE ARTHROSCOPY Bilateral 2000s  . LEFT HEART CATH AND CORONARY ANGIOGRAPHY N/A 05/29/2016   Procedure: Left Heart Cath and Coronary Angiography;  Surgeon: Lennette Bihari, MD;  Location: [facility] INVASIVE CV LAB;  Service:  Cardiovascular;  Laterality: N/A;  . LUMBAR DISC SURGERY  2006; 07/2005  . LUMBAR WOUND DEBRIDEMENT N/A 06/26/2016   Procedure: Incision and Drainage of LUMBAR WOUND;  Surgeon: Donalee Citrin, MD;  Location: [facility] OR;  Service: Neurosurgery;  Laterality: N/A;  . POSTERIOR LUMBAR FUSION  04/2016  . TEE WITHOUT CARDIOVERSION N/A 07/04/2016   Procedure: TRANSESOPHAGEAL ECHOCARDIOGRAM (TEE);  Surgeon: Wendall Stade, MD;  Location: [facility] ENDOSCOPY;  Service: Cardiovascular;  Laterality: N/A;  . TEE WITHOUT CARDIOVERSION N/A 07/30/2016   Procedure: TRANSESOPHAGEAL ECHOCARDIOGRAM (TEE);  Surgeon: Wendall Stade, MD;  Location[facility] END[facility]COPY;  Service: Cardiovascular;  Laterality: N/A;  . TONSILLECTOMY    . TUBAL LIGATION       Medications: Current Meds  Medication Sig  . acetaminophen (TYLENOL) 325 MG tablet Take 650 mg by mouth every 4 (four) hours as needed for mild pain or fever.  Marland Kitchen aspirin EC 81 MG tablet Take 81 mg by mouth daily.  Marland Kitchen atorvastatin (LIPITOR) 80 MG tablet Take 1 tablet (80 mg total) by mouth daily.  . Biotin 10 MG CAPS Take 10 mg by mouth daily.  . Calcium 250 MG CAPS Take by mouth as directed.  . cefdinir (OMNICEF) 300 MG capsule TAKE 1 CAPSULE BY MOUTH EVERY 12 HOURS WITH FOOD  . Cholecalciferol (VITAMIN D) 2000 units CAPS Take 2,000 Units by mouth daily after breakfast.  . cyclobenzaprine (FLEXERIL) 10 MG tablet Take 1 tablet (10 mg total) by mouth 3 (three) times daily as needed for muscle spasms.  . cycloSPORINE (RESTASIS) 0.05 % ophthalmic emulsion Place 1 drop into both eyes 2 (two) times daily.  Marland Kitchen diltiazem (CARDIZEM) 60 MG tablet Take 1 tablet (60 mg total) by mouth 4 (four) times daily.  Marland Kitchen ELIQUIS 5 MG TABS tablet TAKE 1 TABLET BY MOUTH TWICE DAILY  . ferrous sulfate 325 (65 FE) MG tablet Take 325 mg by mouth as directed.   . furosemide (LASIX) 40 MG tablet Take 40 mg by mouth daily.  . hydrALAZINE (APRESOLINE) 25 MG tablet Take 25 mg by mouth 2 (two) times daily.  Marland Kitchen  levothyroxine (SYNTHROID, LEVOTHROID) 137 MCG tablet Take 137 mcg by mouth daily.  . Magnesium 250 MG TABS Take by mouth.  . ondansetron (ZOFRAN) 4 MG tablet Take 8 mg by mouth every 8 (eight) hours.  . pantoprazole (PROTONIX) 40 MG tablet Take 1 tablet (40 mg total) by mouth at bedtime.  Marland Kitchen spironolactone (ALDACTONE) 25 MG tablet Take 1 tablet (25 mg total) by mouth daily.     Allergies: Allergies  Allergen Reactions  . Ace Inhibitors Swelling and Other (See Comments)    ACE stopped after pt seen in ED with facial swelling- allergy testing pending  . Ambien [Zolpidem] Other (See Comments)    confusion  . Fentanyl Nausea And Vomiting and Other (See Comments)    PATCH     . Morphine And Related Other (See Comments)    Unknown  .  Codeine Nausea And Vomiting    Social History: The patient  reports that she has never smoked. She has never used smokeless tobacco. She reports that she does not drink alcohol or use drugs.   Family History: The patient's family history includes CAD in her sister and son.   Review of Systems: Please see the history of present illness.   Otherwise, the review of systems is positive for none.   All other systems are reviewed and negative.   Physical Exam: VS:  BP 124/78   Pulse 75   Ht 5\' 4"  (1.626 m)   Wt 159 lb (72.1 kg)   LMP  (LMP Unknown)   SpO2 95%   BMI 27.29 kg/m  .  BMI Body mass index is 27.29 kg/m.  Wt Readings from Last 3 Encounters:  01/02/18 159 lb (72.1 kg)  12/20/17 156 lb 12.8 oz (71.1 kg)  12/18/17 158 lb 12.8 oz (72 kg)   Affect appropriate Healthy:  appears stated age HEENT: normal Neck supple with no adenopathy JVP normal no bruits no thyromegaly Lungs decreased BS and diaphragmatic motion right base  Heart:  S1/S2 no murmur, no rub, gallop or click PMI normal post sternotomy  Abdomen: benighn, BS positve, no tenderness, no AAA no bruit.  No HSM or HJR Distal pulses intact with no bruits No edema Neuro  non-focal Skin warm and dry No muscular weakness    LABORATORY DATA:  EKG:  EKG is not ordered today.   Lab Results  Component Value Date   WBC 5.5 11/26/2017   HGB 13.9 11/26/2017   HCT 39.8 11/26/2017   PLT 169 11/26/2017   GLUCOSE 113 (H) 12/25/2017   ALT 17 11/26/2017   AST 23 11/26/2017   NA 140 12/25/2017   K 3.9 12/25/2017   CL 100 12/25/2017   CREATININE 1.10 (H) 12/25/2017   BUN 16 12/25/2017   CO2 24 12/25/2017   TSH 1.460 11/26/2017   INR 1.03 09/05/2016   HGBA1C 5.9 (H) 05/30/2016     BNP (last 3 results) No results for input(s): BNP in the last 8760 hours.  ProBNP (last 3 results) Recent Labs    12/09/17 1441  PROBNP 1,728*     Other Studies Reviewed Today:  CXR IMPRESSION: 1. Stable mild. 2. 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 No active lung disease.   Electronically Signed   By: Dwyane Dee M.D.   On: 12/10/2017 08:17   Echo Study Conclusions 11/2017  - Left ventricle: The cavity size was normal. Wall thickness was   increased in a pattern of moderate LVH. Systolic function was   normal. The estimated ejection fraction was in the range of 60%   to 65%. Wall motion was normal; there were no regional wall   motion abnormalities. Doppler parameters are consistent with   abnormal left ventricular relaxation (grade 1 diastolic   dysfunction). - Pulmonary arteries: PA peak pressure: 32 mm Hg (S).  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 No ILD F/U Sniff test with abnormal Right hemi diaphragm motion but not paralyzed  TTE 12/17/17  EF 60-65% grade one diastolic no valve disease  and estimated PA 32 mmHg Seems to be improving  f/u pulmonary  next week   2. HTN - Well controlled.  Continue current medications and low sodium Dash type diet.    3. PAF - Amiodarone d/c in NSR on Eliquis  CHAD2VASC 5 also had LAA clipping during CABG   4. Chronic anticoagulation - remains on Eliquis along with aspirin - lab earlier this month stable.   5. Chronic systolic HF - No ACE/ARB due to facial swelling - would not be candidate for Entresto either. EF is normal by echo 12/17/17.   6. CAD - prior CABG 05/30/16  - she has had no angina don't think ischemic evaluation needed at this time   7.Hypothyroidism -she remains on replacment per PCP  8. Prior back surgery with sepsis/epidural abscess and repeat surgery. She is trying to continue PT.    Current medicines are reviewed with the patient today.  The patient does not have concerns regarding medicines other than what has been noted above.  The following changes have been made:  See above.  Labs/ tests ordered today include:   No orders of the defined types were placed in this encounter.    Disposition:   FU with me in 3 months   Patient is agreeable to this plan and will call if any problems develop in the interim.   Signed: Charlton Haws, MD  01/02/2018 11:41 AM  [facility] [address] [place], [place]  [address] Phone: [phone] Fax: [phone]

## 2017-12-26 LAB — BASIC METABOLIC PANEL
BUN/Creatinine Ratio: 15 (ref 12–28)
BUN: 16 mg/dL (ref 8–27)
CO2: 24 mmol/L (ref 20–29)
Calcium: 8.9 mg/dL (ref 8.7–10.3)
Chloride: 100 mmol/L (ref 96–106)
Creatinine, Ser: 1.1 mg/dL — ABNORMAL HIGH (ref 0.57–1.00)
GFR calc Af Amer: 57 mL/min/{1.73_m2} — ABNORMAL LOW (ref >59)
GFR calc non Af Amer: 50 mL/min/{1.73_m2} — ABNORMAL LOW (ref >59)
Glucose: 113 mg/dL — ABNORMAL HIGH (ref 65–99)
Potassium: 3.9 mmol/L (ref 3.5–5.2)
Sodium: 140 mmol/L (ref 134–144)

## 2018-01-02 ENCOUNTER — Ambulatory Visit (INDEPENDENT_AMBULATORY_CARE_PROVIDER_SITE_OTHER): Payer: Medicare HMO | Admitting: Cardiovascular Disease

## 2018-01-02 ENCOUNTER — Ambulatory Visit: Payer: Medicare HMO | Admitting: Cardiovascular Disease

## 2018-01-02 ENCOUNTER — Other Ambulatory Visit (HOSPITAL_COMMUNITY): Payer: Medicare HMO

## 2018-01-02 ENCOUNTER — Encounter: Payer: Self-pay | Admitting: Cardiovascular Disease

## 2018-01-02 VITALS — BP 124/78 | HR 75 | Ht 64.0 in | Wt 159.0 lb

## 2018-01-02 DIAGNOSIS — Z951 Presence of aortocoronary bypass graft: Secondary | ICD-10-CM

## 2018-01-02 DIAGNOSIS — I5022 Chronic systolic (congestive) heart failure: Secondary | ICD-10-CM

## 2018-01-02 DIAGNOSIS — I480 Paroxysmal atrial fibrillation: Secondary | ICD-10-CM

## 2018-01-02 DIAGNOSIS — R0602 Shortness of breath: Secondary | ICD-10-CM

## 2018-01-02 DIAGNOSIS — Z7901 Long term (current) use of anticoagulants: Secondary | ICD-10-CM

## 2018-01-02 DIAGNOSIS — I10 Essential (primary) hypertension: Secondary | ICD-10-CM | POA: Diagnosis not present

## 2018-01-02 NOTE — Patient Instructions (Addendum)
Medication Instructions:   If you need a refill on your cardiac medications before your next appointment, please call your pharmacy.   Lab work:  If you have labs (blood work) drawn today and your tests are completely normal, you will receive your results only by: Marland Kitchen MyChart Message (if you have MyChart) OR . A paper copy in the mail If you have any lab test that is abnormal or we need to change your treatment, we will call you to review the results.  Testing/Procedures: NONE ordered today.  Follow-Up: At [facility], you and your health needs are our priority.  As part of our continuing mission to provide you with exceptional heart care, we have created designated Provider Care Teams.  These Care Teams include your primary Cardiologist (physician) and Advanced Practice Providers (APPs -  Physician Assistants and Nurse Practitioners) who all work together to provide you with the care you need, when you need it. Your physician recommends that you schedule a follow-up appointment in: 3 months with Dr. Eden Emms.

## 2018-01-06 ENCOUNTER — Ambulatory Visit (INDEPENDENT_AMBULATORY_CARE_PROVIDER_SITE_OTHER): Payer: Medicare HMO | Admitting: Emergency Medicine

## 2018-01-06 ENCOUNTER — Encounter: Payer: Self-pay | Admitting: Emergency Medicine

## 2018-01-06 DIAGNOSIS — R0609 Other forms of dyspnea: Secondary | ICD-10-CM

## 2018-01-06 DIAGNOSIS — R0600 Dyspnea, unspecified: Secondary | ICD-10-CM

## 2018-01-06 NOTE — Progress Notes (Signed)
Subjective:    Patient ID: Erin [name], female    DOB: Jul 27, 1945, 72 y.o.   MRN: [id number]  HPI 72 year old never smoker with history of atrial fibrillation, CAD/CABG and LAA clip (4/18), hypertension, GERD, hypothyroidism, osteoarthritis.  Her hospitalization for CABG was complicated by E. coli sepsis and a paravertebral abscess post lumbar laminectomy and revision later in July.  Her blood pressure has been labile and she had difficulty with rhythm control, rate control since her surgery.  She had been treated with amiodarone.  She complained of dyspnea on exertion that became noticeable when she restarted exercise after the surgeries, also with household chores. It was associated with significant HTN w exercise. In light of the dyspnea, PFT below her amiodarone has been held.   Not coughing. Occasional wheeze when she lays down.   Pulmonary function testing was done on 12/17/2017 which I reviewed.  This shows spirometry most consistent with restriction, no bronchodilator response.  Her lung volumes confirm restriction.  Her diffusion capacity is decreased but corrects to the normal range when adjusted for her alveolar volume.  Chest x-ray done on 12/10/2017 shows no evidence of infiltrate, she has a chronically elevated right hemidiaphragm and evidence for her spinal fusion hardware.  TTE 12/17/17 >> intact LV fxn 65%, normal RV size and fxn. > reassuring.   ROV 01/06/18 --[name] is 65, has a history of hypertension and cardiac disease as outlined above, hospitalization for E. coli sepsis and paravertebral abscess.  She returns today to discuss her exertional dyspnea.  She has restrictive disease on pulmonary function testing in the setting of recent amiodarone use (now discontinued) and elevated right hemidiaphragm.  A CT scan of her chest was done on 12/25/2017 that I reviewed.  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  There is some focal medial right upper lobe and right middle lobe bronchiectatic change.  Sniff test done on 12/24/2017 showed decreased inferior excursion of the right hemidiaphragm but no overt paralysis or paradoxical motion. Her breathing remains about the same. She has limited exertional tolerance.    Review of Systems  Constitutional: Negative for fever and unexpected weight change.  HENT: Positive for postnasal drip, sinus pressure and trouble swallowing. Negative for congestion, dental problem, ear pain, nosebleeds, rhinorrhea, sneezing and sore throat.   Eyes: Negative for redness and itching.  Respiratory: Positive for shortness of breath. Negative for cough, chest tightness and wheezing.   Cardiovascular: Negative for palpitations and leg swelling.  Gastrointestinal: Positive for nausea. Negative for vomiting.  Genitourinary: Negative for dysuria.  Musculoskeletal: Negative for joint swelling.  Skin: Negative for rash.  Allergic/Immunologic: Positive for environmental allergies. Negative for food allergies and immunocompromised state.  Neurological: Positive for headaches.  Hematological: Bruises/bleeds easily.  Psychiatric/Behavioral: Negative for dysphoric mood. The patient is not nervous/anxious.    Past Medical History:  Diagnosis Date  . A-fib (HCC)   . Anxiety   . Arthritis    "all my back is eat up w/it; knees too" (05/24/2016)  . CAD (coronary artery disease)    s/p CABG  . Chronic bronchitis (HCC)   . Chronic lower back pain   . Depression   . Dyspnea    "since OR 04/2016" (05/24/2016)  . Dysrhythmia   . Family history of adverse reaction to anesthesia    "daughter gets PONV" (05/24/2016)  . GERD (gastroesophageal reflux disease)    occ  . High cholesterol   .  History of stomach ulcers   . Hypertension   . Hypothyroidism   . Migraine    "usually have one monthly; nothing since 04/25/2016)  . Pneumonia ~ 2002     Family History    Problem Relation Age of Onset  . CAD Sister        hx of CABG  . CAD Son        hx of CABG     Social History   Socioeconomic History  . Marital status: Divorced    Spouse name: Not on file  . Number of children: Not on file  . Years of education: Not on file  . Highest education level: Not on file  Occupational History  . Not on file  Social Needs  . Financial resource strain: Not on file  . Food insecurity:    Worry: Not on file    Inability: Not on file  . Transportation needs:    Medical: Not on file    Non-medical: Not on file  Tobacco Use  . Smoking status: Never Smoker  . Smokeless tobacco: Never Used  Substance and Sexual Activity  . Alcohol use: No  . Drug use: No  . Sexual activity: Not Currently  Lifestyle  . Physical activity:    Days per week: Not on file    Minutes per session: Not on file  . Stress: Not on file  Relationships  . Social connections:    Talks on phone: Not on file    Gets together: Not on file    Attends religious service: Not on file    Active member of club or organization: Not on file    Attends meetings of clubs or organizations: Not on file    Relationship status: Not on file  . Intimate partner violence:    Fear of current or ex partner: Not on file    Emotionally abused: Not on file    Physically abused: Not on file    Forced sexual activity: Not on file  Other Topics Concern  . Not on file  Social History Narrative  . Not on file  non smoker, second hand cigarettes smoke.  Has worked in [employer], court system.   Allergies  Allergen Reactions  . Ace Inhibitors Swelling and Other (See Comments)    ACE stopped after pt seen in ED with facial swelling- allergy testing pending  . Ambien [Zolpidem] Other (See Comments)    confusion  . Fentanyl Nausea And Vomiting and Other (See Comments)    PATCH     . Morphine And Related Other (See Comments)    Unknown  . Codeine Nausea And Vomiting     Outpatient Medications Prior  to Visit  Medication Sig Dispense Refill  . acetaminophen (TYLENOL) 325 MG tablet Take 650 mg by mouth every 4 (four) hours as needed for mild pain or fever.    Marland Kitchen aspirin EC 81 MG tablet Take 81 mg by mouth daily.    Marland Kitchen atorvastatin (LIPITOR) 80 MG tablet Take 1 tablet (80 mg total) by mouth daily. 90 tablet 3  . Biotin 10 MG CAPS Take 10 mg by mouth daily.    . Calcium 250 MG CAPS Take by mouth as directed.    . Cholecalciferol (VITAMIN D) 2000 units CAPS Take 2,000 Units by mouth daily after breakfast.    . cyclobenzaprine (FLEXERIL) 10 MG tablet Take 1 tablet (10 mg total) by mouth 3 (three) times daily as needed for muscle spasms. 30  tablet 0  . cycloSPORINE (RESTASIS) 0.05 % ophthalmic emulsion Place 1 drop into both eyes 2 (two) times daily.    Marland Kitchen diltiazem (CARDIZEM) 60 MG tablet Take 1 tablet (60 mg total) by mouth 4 (four) times daily. 90 tablet 3  . ELIQUIS 5 MG TABS tablet TAKE 1 TABLET BY MOUTH TWICE DAILY 60 tablet 6  . ferrous sulfate 325 (65 FE) MG tablet Take 325 mg by mouth as directed.     . furosemide (LASIX) 40 MG tablet Take 40 mg by mouth daily.    . hydrALAZINE (APRESOLINE) 25 MG tablet Take 25 mg by mouth 2 (two) times daily.    Marland Kitchen levothyroxine (SYNTHROID, LEVOTHROID) 137 MCG tablet Take 137 mcg by mouth daily.  3  . Magnesium 250 MG TABS Take by mouth.    . ondansetron (ZOFRAN) 4 MG tablet Take 8 mg by mouth every 8 (eight) hours.    . pantoprazole (PROTONIX) 40 MG tablet Take 1 tablet (40 mg total) by mouth at bedtime. 30 tablet 0  . spironolactone (ALDACTONE) 25 MG tablet Take 1 tablet (25 mg total) by mouth daily. 90 tablet 3  . cefdinir (OMNICEF) 300 MG capsule TAKE 1 CAPSULE BY MOUTH EVERY 12 HOURS WITH FOOD  0   No facility-administered medications prior to visit.         Objective:   Physical Exam Vitals:   01/06/18 1513  BP: 130/78  Pulse: 71  SpO2: 96%  Weight: 159 lb (72.1 kg)  Height: 5\' 4"  (1.626 m)   Gen: Pleasant, well-nourished, in no  distress,  normal affect  ENT: No lesions,  mouth clear,  oropharynx clear, no postnasal drip  Neck: No JVD, no stridor  Lungs: No use of accessory muscles, no crackles, good air movement B  Cardiovascular: RRR, heart sounds normal, no murmur or gallops, no peripheral edema  Musculoskeletal: No deformities, no cyanosis or clubbing  Neuro: alert, non focal  Skin: Warm, no lesions or rash      Assessment & Plan:  Dyspnea on exertion Cardiac evaluation has been reassuring and she is back in sinus rhythm, has better blood pressure control.  Pulmonary function testing with principally restriction, no clear indication for bronchodilators.  Her expiratory films on high-resolution CT scan did show some air trapping and tracheobronchial malacia but unclear that this is clinically significant.  She did not have any interstitial lung disease.  Her right hemidiaphragm is elevated but does not appear to be paralyzed.  I suspect that this is at least a component of her baseline restrictive physiology.  Most important issue at this point is deconditioning following a very difficult year with hospitalizations and surgeries.  I think she needs to go to cardiopulmonary rehab.  She would like to do this at [facility].  I will work on arranging for her.  Levy Pupa, MD, PhD 01/06/2018, 3:36 PM [facility] [phone] or if no answer [phone]

## 2018-01-06 NOTE — Patient Instructions (Addendum)
We will refer you for pulmonary rehab in [facility] to help you rebuild your stamina.  You should not need to be on any inhaled medications.  Follow with Dr Delton Coombes if needed for any changes in your breathing

## 2018-01-06 NOTE — Assessment & Plan Note (Addendum)
Cardiac evaluation has been reassuring and she is back in sinus rhythm, has better blood pressure control.  Pulmonary function testing with principally restriction, no clear indication for bronchodilators.  Her expiratory films on high-resolution CT scan did show some air trapping and tracheobronchial malacia but unclear that this is clinically significant.  She did not have any interstitial lung disease.  Her right hemidiaphragm is elevated but does not appear to be paralyzed.  I suspect that this is at least a component of her baseline restrictive physiology.  Most important issue at this point is deconditioning following a very difficult year with hospitalizations and surgeries.  I think she needs to go to cardiopulmonary rehab.  She would like to do this at [facility].  I will work on arranging for her.

## 2018-01-09 ENCOUNTER — Ambulatory Visit: Payer: Medicare HMO | Admitting: Cardiovascular Disease

## 2018-02-05 ENCOUNTER — Telehealth: Payer: Self-pay | Admitting: Emergency Medicine

## 2018-02-05 DIAGNOSIS — R5381 Other malaise: Secondary | ICD-10-CM

## 2018-02-05 NOTE — Telephone Encounter (Signed)
I have never seen this patient. I reviewed Dr. Kavin Leech last note and it specified as noted below.  Her right hemidiaphragm is elevated but does not appear to be paralyzed.  I suspect that this is at least a component of her baseline restrictive physiology.  Most important issue at this point is deconditioning following a very difficult year with hospitalizations and surgeries.  So try right hemidiaphragm elevation or severe deconditioning after frequent hospitalizations and surgeries.  Place under Dr. Delton Coombes. Let her know it does often take awhile to get a call back.  Thanks

## 2018-02-05 NOTE — Telephone Encounter (Signed)
Instructions   We will refer you for pulmonary rehab in [facility] to help you rebuild your stamina.  You should not need to be on any inhaled medications.  Follow with Dr Delton Coombes if needed for any changes in your breathing     Pt is aware that order has been placed and PCC's will fax over information. Someone at Pulmonary Rehab at [facility] will contact her. Nothing more needed at this time.

## 2018-02-05 NOTE — Telephone Encounter (Signed)
Call made to patient, patient states she was supposed to be sent to Pulmonary Rehab in [facility] and she has not heard from anyone. Upon review I do not see that a order was placed.   SG please advise of a DX I can use to place pulmonary rehab order.

## 2018-03-25 NOTE — Progress Notes (Signed)
CARDIOLOGY OFFICE NOTE  Date:  04/01/2018    Erin [name] Date of Birth: 03-03-1945 Medical Record #[id number]  PCP:  Caffie Damme, MD  Cardiologist:  Townsend Roger   No chief complaint on file.   History of Present Illness: Erin [name] is a 73 y.o. female f/U CAD/CABG, PAF, HTN, Dyspnea and lung disease   She has had CABG by Dr Dorris Fetch 05/30/16 with LAA clipping.Hercourse wascomplicated by Ecoli sepsis and paravertebral abscess post lumbar laminectomyfromMarch os 2018with revision by Dr Wynetta Emery 06/26/16. She had recurrent PAF - was placedback on Eliquis and wasloaded with amiodarone and has had TEE cardioversion 07/04/16 and there was thrombus in her LAA but clip was intact with no communication to her LA and no other thrombus. She went back into rapid atrial flutter with rates in the 120s. She was symptomatic with palpitations. She required repeat TEE cardioversion 07/2016 back to NSR. EF has dropped to 40-45% with moderate MR. She was readmitted towards the end of July by Dr. Wynetta Emery and had"exploration of fusion removal of hardware L2-S1 with removal of bilateral loose L2 pedicle screws which were sent for culture and removal of rods and all top tightening nuts and cross-link".She did ok with this surgery.Tolerated ok.Followed by cardiology.  Been seen for elevated BP with addition of CCB and Hydralazine. More dyspnea. TTE 12/17/17 EF 60-65% grade one diastolic No pulmonary HTN.  BM{ 1728 PFT;s 12/17/17 with restriction and DLCO corrects for alveolar volume CT with small vessel disease Elevated right hemi diaphragm and no ILD  Dr Delton Coombes saw on 12/20/17 and felt dyspnea from deconditioning and presumed new right hemidiaphragm elevation. Sniff test was not diagnostic of paralysis but had decreased inferior excursion Referred to [facility] for pulmonary rehab  She feels better Pulmonary rehab has been good     Past Medical History:  Diagnosis Date  . A-fib (HCC)   .  Anxiety   . Arthritis    "all my back is eat up w/it; knees too" (05/24/2016)  . CAD (coronary artery disease)    s/p CABG  . Chronic bronchitis (HCC)   . Chronic lower back pain   . Depression   . Dyspnea    "since OR 04/2016" (05/24/2016)  . Dysrhythmia   . Family history of adverse reaction to anesthesia    "daughter gets PONV" (05/24/2016)  . GERD (gastroesophageal reflux disease)    occ  . High cholesterol   . History of stomach ulcers   . Hypertension   . Hypothyroidism   . Migraine    "usually have one monthly; nothing since 04/25/2016)  . Pneumonia ~ 2002    Past Surgical History:  Procedure Laterality Date  . ANTERIOR CERVICAL DECOMP/DISCECTOMY FUSION  ~ 2009  . BACK SURGERY    . BACK SURGERY     JUNE  2018  . BLEPHAROPLASTY    . CARDIOVERSION N/A 07/04/2016   Procedure: CARDIOVERSION;  Surgeon: Wendall Stade, MD;  Location: [facility] ENDOSCOPY;  Service: Cardiovascular;  Laterality: N/A;  . CARDIOVERSION N/A 07/30/2016   Procedure: CARDIOVERSION;  Surgeon: Wendall Stade, MD;  Location: [facility] ENDOSCOPY;  Service: Cardiovascular;  Laterality: N/A;  . CARPAL TUNNEL RELEASE Bilateral 80's  . CATARACT EXTRACTION, BILATERAL    . CORONARY ARTERY BYPASS GRAFT N/A 05/30/2016   Procedure: CORONARY ARTERY BYPASS GRAFTING (CABG), ON PUMP, TIMES FOUR, USING LEFT INTERNAL MAMMARY ARTERY AND ENDOSCOPICALLY HARVESTED BILATERAL GREATER SAPHENOUS VEINS WITH CLIPPING OF LEFT ATRIAL APPENDAGE;  Surgeon: Viviann Spare  Lars Pinks Hendrickson, MD;  Location: [facility] OR;  Service: Open Heart Surgery;  Laterality: N/A;  LIMA to LAD, SVG to RAMUS INTERMEDIATE, and SVG SEQUENTIALLY to CIRCUMFLEX and PDA  . IR FLUORO GUIDED NEEDLE PLC ASPIRATION/INJECTION LOC  07/12/2016  . KNEE ARTHROSCOPY Bilateral 2000s  . LEFT HEART CATH AND CORONARY ANGIOGRAPHY N/A 05/29/2016   Procedure: Left Heart Cath and Coronary Angiography;  Surgeon: Lennette Biharihomas A Kelly, MD;  [facility];  Service: Cardiovascular;  Laterality: N/A;  . LUMBAR  DISC SURGERY  2006; 07/2005  . LUMBAR WOUND DEBRIDEMENT N/A 06/26/2016   Procedure: Incision and Drainage of LUMBAR WOUND;  Surgeon: Donalee Citrinram, Gary, MD;  [facility];  Service: Neurosurgery;  Laterality: N/A;  . POSTERIOR LUMBAR FUSION  04/2016  . TEE WITHOUT CARDIOVERSION N/A 07/04/2016   Procedure: TRANSESOPHAGEAL ECHOCARDIOGRAM (TEE);  Surgeon: Wendall StadeNishan, Peter C, MD;  [facility];  Service: Cardiovascular;  Laterality: N/A;  . TEE WITHOUT CARDIOVERSION N/A 07/30/2016   Procedure: TRANSESOPHAGEAL ECHOCARDIOGRAM (TEE);  Surgeon: Wendall StadeNishan, Peter C, MD;  [facility];  Service: Cardiovascular;  Laterality: N/A;  . TONSILLECTOMY    . TUBAL LIGATION       Medications: Current Meds  Medication Sig  . acetaminophen (TYLENOL) 325 MG tablet Take 650 mg by mouth every 4 (four) hours as needed for mild pain or fever.  Marland Kitchen. aspirin EC 81 MG tablet Take 81 mg by mouth daily.  Marland Kitchen. atorvastatin (LIPITOR) 80 MG tablet Take 1 tablet (80 mg total) by mouth daily.  . Biotin 10 MG CAPS Take 10 mg by mouth daily.  . Calcium 250 MG CAPS Take by mouth as directed.  . Cholecalciferol (VITAMIN D) 2000 units CAPS Take 2,000 Units by mouth daily after breakfast.  . cyclobenzaprine (FLEXERIL) 10 MG tablet Take 1 tablet (10 mg total) by mouth 3 (three) times daily as needed for muscle spasms.  . cycloSPORINE (RESTASIS) 0.05 % ophthalmic emulsion Place 1 drop into both eyes 2 (two) times daily.  Marland Kitchen. diltiazem (CARDIZEM) 60 MG tablet Take 1 tablet (60 mg total) by mouth 4 (four) times daily.  Marland Kitchen. ELIQUIS 5 MG TABS tablet TAKE 1 TABLET BY MOUTH TWICE DAILY  . ferrous sulfate 325 (65 FE) MG tablet Take 325 mg by mouth as directed.   . furosemide (LASIX) 40 MG tablet Take 40 mg by mouth daily.  . hydrALAZINE (APRESOLINE) 25 MG tablet Take 25 mg by mouth 2 (two) times daily.  Marland Kitchen. levothyroxine (SYNTHROID, LEVOTHROID) 137 MCG tablet Take 137 mcg by mouth daily.  . Magnesium 250 MG TABS Take by mouth.  . ondansetron  (ZOFRAN) 4 MG tablet Take 8 mg by mouth every 8 (eight) hours.  . pantoprazole (PROTONIX) 40 MG tablet Take 1 tablet (40 mg total) by mouth at bedtime.     Allergies: Allergies  Allergen Reactions  . Ace Inhibitors Swelling and Other (See Comments)    ACE stopped after pt seen in ED with facial swelling- allergy testing pending  . Ambien [Zolpidem] Other (See Comments)    confusion  . Fentanyl Nausea And Vomiting and Other (See Comments)    PATCH     . Morphine And Related Other (See Comments)    Unknown  . Codeine Nausea And Vomiting    Social History: The patient  reports that she has never smoked. She has never used smokeless tobacco. She reports that she does not drink alcohol or use drugs.   Family History: The patient's family history includes CAD  in her sister and son.   Review of Systems: Please see the history of present illness.   Otherwise, the review of systems is positive for none.   All other systems are reviewed and negative.   Physical Exam: VS:  BP 112/64   Pulse 70   Ht 5\' 4"  (1.626 m)   Wt 162 lb (73.5 kg)   LMP  (LMP Unknown)   BMI 27.81 kg/m  .  BMI Body mass index is 27.81 kg/m.  Wt Readings from Last 3 Encounters:  04/01/18 162 lb (73.5 kg)  01/06/18 159 lb (72.1 kg)  01/02/18 159 lb (72.1 kg)   Affect appropriate Healthy:  appears stated age HEENT: normal Neck supple with no adenopathy JVP normal no bruits no thyromegaly Lungs decreased BS and diaphragmatic motion right base  Heart:  S1/S2 no murmur, no rub, gallop or click PMI normal post sternotomy  Abdomen: benighn, BS positve, no tenderness, no AAA no bruit.  No HSM or HJR Distal pulses intact with no bruits No edema Neuro non-focal Skin warm and dry No muscular weakness    LABORATORY DATA:  EKG:   12/09/17 SR rate 64 low voltage otherwise normal   Lab Results  Component Value Date   WBC 5.5 11/26/2017   HGB 13.9 11/26/2017   HCT 39.8 11/26/2017   PLT 169 11/26/2017    GLUCOSE 113 (H) 12/25/2017   ALT 17 11/26/2017   AST 23 11/26/2017   NA 140 12/25/2017   K 3.9 12/25/2017   CL 100 12/25/2017   CREATININE 1.10 (H) 12/25/2017   BUN 16 12/25/2017   CO2 24 12/25/2017   TSH 1.460 11/26/2017   INR 1.03 09/05/2016   HGBA1C 5.9 (H) 05/30/2016     BNP (last 3 results) No results for input(s): BNP in the last 8760 hours.  ProBNP (last 3 results) Recent Labs    12/09/17 1441  PROBNP 1,728*     Other Studies Reviewed Today:  CXR IMPRESSION: 1. Stable mild. 2. 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 No active lung disease.   Electronically Signed   By: Dwyane Dee M.D.   On: 12/10/2017 08:17   Echo Study Conclusions 11/2017  - Left ventricle: The cavity size was normal. Wall thickness was   increased in a pattern of moderate LVH. Systolic function was   normal. The estimated ejection fraction was in the range of 60%   to 65%. Wall motion was normal; there were no regional wall   motion abnormalities. Doppler parameters are consistent with   abnormal left ventricular relaxation (grade 1 diastolic   dysfunction). - Pulmonary arteries: PA peak pressure: 32 mm Hg (S).  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 No ILD F/U Sniff test with abnormal Right hemi diaphragm motion but not paralyzed  TTE 12/17/17  EF 60-65% grade one diastolic no valve disease  and estimated PA 32 mmHg Seems to be improving Pulmonary has seen in December 2019 with referral to pulmonary rehab [facility] due to elevated right hemidiaphragm and deconditioning  Improving with pulmonary rehab continue   2. HTN - Well controlled.  Continue current medications and low  sodium Dash type diet.    3. PAF - Amiodarone d/c in  NSR on Eliquis  CHAD2VASC 5 also had LAA clipping during CABG   4. Chronic anticoagulation - remains on Eliquis along with aspirin - lab earlier this month stable.   5. Chronic systolic HF - No ACE/ARB due to facial swelling - would not be candidate for Entresto either. EF is normal by echo 12/17/17.   6. CAD - prior CABG 05/30/16  - she has had no angina don't think ischemic evaluation needed at this time   7.Hypothyroidism -she remains on replacment per PCP  8. Prior back surgery with sepsis/epidural abscess and repeat surgery. She is trying to continue PT.    Current medicines are reviewed with the patient today.  The patient does not have concerns regarding medicines other than what has been noted above.  The following changes have been made:  See above.  Labs/ tests ordered today include:   No orders of the defined types were placed in this encounter.    Disposition:   FU with me in 60months   Patient is agreeable to this plan and will call if any problems develop in the interim.   Signed: Charlton Haws, MD  04/01/2018 10:12 AM  [facility] [address] [place], [place]  [address] Phone: [phone] Fax: [phone]

## 2018-04-01 ENCOUNTER — Ambulatory Visit (INDEPENDENT_AMBULATORY_CARE_PROVIDER_SITE_OTHER): Payer: Medicare HMO | Admitting: Cardiovascular Disease

## 2018-04-01 ENCOUNTER — Encounter (INDEPENDENT_AMBULATORY_CARE_PROVIDER_SITE_OTHER): Payer: Self-pay

## 2018-04-01 VITALS — BP 112/64 | HR 70 | Ht 64.0 in | Wt 162.0 lb

## 2018-04-01 DIAGNOSIS — R0600 Dyspnea, unspecified: Secondary | ICD-10-CM | POA: Diagnosis not present

## 2018-04-01 NOTE — Patient Instructions (Signed)
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
## 2018-09-12 ENCOUNTER — Telehealth: Payer: Self-pay | Admitting: Emergency Medicine

## 2018-09-12 ENCOUNTER — Telehealth: Payer: Self-pay | Admitting: Cardiovascular Disease

## 2018-09-12 NOTE — Telephone Encounter (Signed)
Call returned to [employer], Spoke with [place], she states she wanted to set up a phone conference, she states RB has a deposition coming up in regards to this patient so she wanted to touch bases with him before. She can be reached at [phone]. I did make her aware that RB is not in clinic today but will be back next week. Will route message to [place] and nurse to address upon his return to clinic.   RB please advise. Thanks.

## 2018-09-12 NOTE — Telephone Encounter (Signed)
Paola @ [facility] would like to set up an appointment time to speak with Dr Johnsie Cancel regarding this patient

## 2018-09-15 NOTE — Telephone Encounter (Signed)
I don't speak with lawyers routinely If they have paperwork they can send it

## 2018-09-16 NOTE — Telephone Encounter (Signed)
I contact Maurie Boettcher with [employer] and Malachy Mood said she would call [employer] back regarding this request.  No further action needed.

## 2018-09-16 NOTE — Telephone Encounter (Signed)
Dr. Byrum, please advise on this. Thanks! 

## 2018-09-19 NOTE — Telephone Encounter (Signed)
Dr. Lamonte Sakai and his nurse will be back in office on 09/22/18. Will leave until he returns to office

## 2018-09-22 NOTE — Telephone Encounter (Signed)
I don't know of any deposition, and I do not usually review cases with attorneys. If there is paperwork that they would like for me to review and consider completing then I am willing to do so.
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
## 2018-09-25 NOTE — Telephone Encounter (Signed)
Still waiting on response from [place].

## 2018-09-30 NOTE — Telephone Encounter (Signed)
We can provide the patient's notes, [facility] to them WITH the patient's permission, medical release. I don't intend to have a conference with them

## 2018-09-30 NOTE — Telephone Encounter (Signed)
We will need to contact this law office back in the morning as it is after hours.

## 2018-10-01 NOTE — Telephone Encounter (Signed)
Called and spoke with Imagene Gurney with [employer]. Stated to her that [place] did not want to have a conference but stated that we could provide notes from office visits that pt had with [place]. Stated that it would probably be best for her to contact medical records to get these. Imagene Gurney stated that they should have the notes from visits pt had at office in November 2019. Nothing further needed.
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
## 2018-11-19 ENCOUNTER — Other Ambulatory Visit: Payer: Self-pay

## 2018-11-19 ENCOUNTER — Encounter: Payer: Self-pay | Admitting: Cardiovascular Disease

## 2018-11-19 ENCOUNTER — Ambulatory Visit (INDEPENDENT_AMBULATORY_CARE_PROVIDER_SITE_OTHER): Payer: Medicare HMO | Admitting: Cardiovascular Disease

## 2018-11-19 VITALS — BP 114/76 | HR 74 | Ht 66.0 in | Wt 171.8 lb

## 2018-11-19 DIAGNOSIS — Z951 Presence of aortocoronary bypass graft: Secondary | ICD-10-CM | POA: Diagnosis not present

## 2018-11-19 NOTE — Patient Instructions (Addendum)

## 2018-11-27 ENCOUNTER — Other Ambulatory Visit: Payer: Self-pay | Admitting: Nurse Practitioner

## 2019-11-17 NOTE — Progress Notes (Signed)
CARDIOLOGY OFFICE NOTE  Date:  11/26/2019    Erin [name] Date of Birth: 07-27-1945 Medical Record #[id number]  PCP:  Caffie Damme, MD  Cardiologist:  Townsend Roger   No chief complaint on file.   History of Present Illness: Erin [name] is a 74 y.o. female f/U CAD/CABG, PAF, HTN, Dyspnea and lung disease   She has had CABG by Dr Dorris Fetch 05/30/16 with LAA clipping.Hercourse wascomplicated by Ecoli sepsis and paravertebral abscess post lumbar laminectomyfromMarch os 2018with revision by Dr Wynetta Emery 06/26/16. She had recurrent PAF - was placedback on Eliquis and wasloaded with amiodarone and has had TEE cardioversion 07/04/16 and there was thrombus in her LAA but clip was intact with no communication to her LA and no other thrombus. She went back into rapid atrial flutter with rates in the 120s. She was symptomatic with palpitations. She required repeat TEE cardioversion 07/2016 back to NSR. EF has dropped to 40-45% with moderate MR. She was readmitted towards the end of July by Dr. Wynetta Emery and had"exploration of fusion removal of hardware L2-S1 with removal of bilateral loose L2 pedicle screws which were sent for culture and removal of rods and all top tightening nuts and cross-link".She did ok with this surgery.Tolerated ok.Followed by cardiology.  Been seen for elevated BP with addition of CCB and Hydralazine. More dyspnea. TTE 12/17/17 EF 60-65% grade one diastolic No pulmonary HTN.  BM{ 1728 PFT;s 12/17/17 with restriction and DLCO corrects for alveolar volume CT with small vessel disease Elevated right hemi diaphragm and no ILD  Dr Delton Coombes saw on 12/20/17 and felt dyspnea from deconditioning and presumed new right hemidiaphragm elevation. Sniff test was not diagnostic of paralysis but had decreased inferior excursion Referred to [facility] for pulmonary rehab   No cardiac complaints Getting flu shot with primary soon and has had COVID vaccine    Past Medical History:   Diagnosis Date  . A-fib (HCC)   . Anxiety   . Arthritis    "all my back is eat up w/it; knees too" (05/24/2016)  . CAD (coronary artery disease)    s/p CABG  . Chronic bronchitis (HCC)   . Chronic lower back pain   . Depression   . Dyspnea    "since OR 04/2016" (05/24/2016)  . Dysrhythmia   . Family history of adverse reaction to anesthesia    "daughter gets PONV" (05/24/2016)  . GERD (gastroesophageal reflux disease)    occ  . High cholesterol   . History of stomach ulcers   . Hypertension   . Hypothyroidism   . Migraine    "usually have one monthly; nothing since 04/25/2016)  . Pneumonia ~ 2002    Past Surgical History:  Procedure Laterality Date  . ANTERIOR CERVICAL DECOMP/DISCECTOMY FUSION  ~ 2009  . BACK SURGERY    . BACK SURGERY     JUNE  2018  . BLEPHAROPLASTY    . CARDIOVERSION N/A 07/04/2016   Procedure: CARDIOVERSION;  Surgeon: Wendall Stade, MD;  Location[facility] ENDOSCOPY;  Service: Cardiovascular;  Laterality: N/A;  . CARDIOVERSION N/A 07/30/2016   Procedure: CARDIOVERSION;  Surgeon: Wendall Stade, MD;  Location: [facility] ENDOSCOPY;  Service: Cardiovascular;  Laterality: N/A;  . CARPAL TUNNEL RELEASE Bilateral 80's  . CATARACT EXTRACTION, BILATERAL    . CORONARY ARTERY BYPASS GRAFT N/A 05/30/2016   Procedure: CORONARY ARTERY BYPASS GRAFTING (CABG), ON PUMP, TIMES FOUR, USING LEFT INTERNAL MAMMARY ARTERY AND ENDOSCOPICALLY HARVESTED BILATERAL GREATER SAPHENOUS VEINS WITH CLIPPING OF  LEFT ATRIAL APPENDAGE;  Surgeon: Loreli Slot, MD;  Location: [facility] OR;  Service: Open Heart Surgery;  Laterality: N/A;  LIMA to LAD, SVG to RAMUS INTERMEDIATE, and SVG SEQUENTIALLY to CIRCUMFLEX and PDA  . IR FLUORO GUIDED NEEDLE PLC ASPIRATION/INJECTION LOC  07/12/2016  . KNEE ARTHROSCOPY Bilateral 2000s  . LEFT HEART CATH AND CORONARY ANGIOGRAPHY N/A 05/29/2016   Procedure: Left Heart Cath and Coronary Angiography;  Surgeon: Lennette Bihari, MD;  Location: [facility] INVASIVE CV LAB;  Service:  Cardiovascular;  Laterality: N/A;  . LUMBAR DISC SURGERY  2006; 07/2005  . LUMBAR WOUND DEBRIDEMENT N/A 06/26/2016   Procedure: Incision and Drainage of LUMBAR WOUND;  Surgeon: Donalee Citrin, MD;  Location: [facility] OR;  Service: Neurosurgery;  Laterality: N/A;  . POSTERIOR LUMBAR FUSION  04/2016  . TEE WITHOUT CARDIOVERSION N/A 07/04/2016   Procedure: TRANSESOPHAGEAL ECHOCARDIOGRAM (TEE);  Surgeon: Wendall Stade, MD;  Location: [facility] ENDOSCOPY;  Service: Cardiovascular;  Laterality: N/A;  . TEE WITHOUT CARDIOVERSION N/A 07/30/2016   Procedure: TRANSESOPHAGEAL ECHOCARDIOGRAM (TEE);  Surgeon: Wendall Stade, MD;  Location: [facility] ENDOSCOPY;  Service: Cardiovascular;  Laterality: N/A;  . TONSILLECTOMY    . TUBAL LIGATION       Medications: Current Meds  Medication Sig  . acetaminophen (TYLENOL) 325 MG tablet Take 650 mg by mouth every 4 (four) hours as needed for mild pain or fever.  Marland Kitchen aspirin EC 81 MG tablet Take 81 mg by mouth daily.  . Biotin 10 MG CAPS Take 10 mg by mouth daily.  . cyclobenzaprine (FLEXERIL) 10 MG tablet Take 1 tablet (10 mg total) by mouth 3 (three) times daily as needed for muscle spasms.  . cycloSPORINE (RESTASIS) 0.05 % ophthalmic emulsion Place 1 drop into both eyes 2 (two) times daily.  Marland Kitchen diltiazem (CARDIZEM) 60 MG tablet Take 1 tablet (60 mg total) by mouth 4 (four) times daily.  Marland Kitchen ELIQUIS 5 MG TABS tablet TAKE 1 TABLET BY MOUTH TWICE DAILY  . furosemide (LASIX) 40 MG tablet Take 40 mg by mouth daily.  . hydrALAZINE (APRESOLINE) 25 MG tablet Take 25 mg by mouth 2 (two) times daily.  Marland Kitchen levothyroxine (SYNTHROID) 100 MCG tablet   . levothyroxine (SYNTHROID) 112 MCG tablet Take 112 mcg by mouth daily.  . pantoprazole (PROTONIX) 40 MG tablet Take 1 tablet (40 mg total) by mouth at bedtime.  Marland Kitchen spironolactone (ALDACTONE) 25 MG tablet Take 1 tablet by mouth once daily     Allergies: Allergies  Allergen Reactions  . Ace Inhibitors Swelling and Other (See Comments)    ACE stopped  after pt seen in ED with facial swelling- allergy testing pending  . Ambien [Zolpidem] Other (See Comments)    confusion  . Fentanyl Nausea And Vomiting and Other (See Comments)    PATCH     . Morphine And Related Other (See Comments)    Unknown  . Codeine Nausea And Vomiting    Social History: The patient  reports that she has never smoked. She has never used smokeless tobacco. She reports that she does not drink alcohol and does not use drugs.   Family History: The patient's family history includes CAD in her sister and son.   Review of Systems: Please see the history of present illness.   Otherwise, the review of systems is positive for none.   All other systems are reviewed and negative.   Physical Exam: VS:  LMP  (LMP Unknown)  .  BMI There is no height or  weight on file to calculate BMI.  Wt Readings from Last 3 Encounters:  11/19/18 171 lb 12.8 oz (77.9 kg)  04/01/18 162 lb (73.5 kg)  01/06/18 159 lb (72.1 kg)   Affect appropriate Healthy:  appears stated age HEENT: normal Neck supple with no adenopathy JVP normal no bruits no thyromegaly Lungs decreased BS and diaphragmatic motion right base  Heart:  S1/S2 no murmur, no rub, gallop or click PMI normal post sternotomy  Abdomen: benighn, BS positve, no tenderness, no AAA no bruit.  No HSM or HJR Distal pulses intact with no bruits No edema Neuro non-focal Skin warm and dry No muscular weakness    LABORATORY DATA:  EKG:   SR rate 87 PAC otherwise normal   Lab Results  Component Value Date   WBC 5.5 11/26/2017   HGB 13.9 11/26/2017   HCT 39.8 11/26/2017   PLT 169 11/26/2017   GLUCOSE 113 (H) 12/25/2017   ALT 17 11/26/2017   AST 23 11/26/2017   NA 140 12/25/2017   K 3.9 12/25/2017   CL 100 12/25/2017   CREATININE 1.10 (H) 12/25/2017   BUN 16 12/25/2017   CO2 24 12/25/2017   TSH 1.460 11/26/2017   INR 1.03 09/05/2016   HGBA1C 5.9 (H) 05/30/2016     BNP (last 3 results) No results for input(s):  BNP in the last 8760 hours.  ProBNP (last 3 results) No results for input(s): PROBNP in the last 8760 hours.   Other Studies Reviewed Today:  CXR IMPRESSION: 1. Stable mild. 2. 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 No active lung disease.   Electronically Signed   By: Dwyane Dee M.D.   On: 12/10/2017 08:17   Echo Study Conclusions 11/2017  - Left ventricle: The cavity size was normal. Wall thickness was   increased in a pattern of moderate LVH. Systolic function was   normal. The estimated ejection fraction was in the range of 60%   to 65%. Wall motion was normal; there were no regional wall   motion abnormalities. Doppler parameters are consistent with   abnormal left ventricular relaxation (grade 1 diastolic   dysfunction). - Pulmonary arteries: PA peak pressure: 32 mm Hg (S).  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 HTN - Well controlled.  Continue current medications and low sodium Dash type diet.    3. PAF - Amiodarone d/c in NSR on Eliquis  CHAD2VASC 5 also had LAA clipping during CABG   4. Chronic anticoagulation - remains on Eliquis along with aspirin - lab earlier this month stable.   5. Chronic systolic HF - No ACE/ARB due to facial swelling - would not be candidate for Entresto either. EF is normal by echo 12/17/17. See above   6. CAD - prior CABG 05/30/16  - she has had no angina don't think ischemic evaluation needed at this time   7.Hypothyroidism -she remains on replacment per PCP  8. Prior back surgery with sepsis/epidural abscess and repeat surgery. She is trying to continue PT.     Current medicines are reviewed with the patient today.  The patient does not have concerns regarding medicines other than what  has been noted above.  The following changes have been made:  See above.  Labs/ tests ordered today include: Echo for dyspnea and history of DCM  No orders of the defined types were placed in this encounter.    Disposition:   FU with me in a year if echo ok   Patient is agreeable to this plan and will call if any problems develop in the interim.   Signed: Charlton Haws, MD  11/26/2019 9:29 AM  [facility] [address] [place], [place]  [address] Phone: [phone] Fax: [phone]

## 2019-11-25 ENCOUNTER — Other Ambulatory Visit: Payer: Self-pay | Admitting: Nurse Practitioner

## 2019-11-26 ENCOUNTER — Ambulatory Visit (INDEPENDENT_AMBULATORY_CARE_PROVIDER_SITE_OTHER): Payer: Medicare HMO | Admitting: Cardiovascular Disease

## 2019-11-26 ENCOUNTER — Encounter: Payer: Self-pay | Admitting: Cardiovascular Disease

## 2019-11-26 ENCOUNTER — Other Ambulatory Visit: Payer: Self-pay

## 2019-11-26 VITALS — BP 132/64 | HR 87 | Ht 66.0 in | Wt 162.0 lb

## 2019-11-26 DIAGNOSIS — R0600 Dyspnea, unspecified: Secondary | ICD-10-CM

## 2019-11-26 DIAGNOSIS — I480 Paroxysmal atrial fibrillation: Secondary | ICD-10-CM | POA: Diagnosis not present

## 2019-11-26 NOTE — Patient Instructions (Addendum)
Medication Instructions:  *If you need a refill on your cardiac medications before your next appointment, please call your pharmacy*  Lab Work: If you have labs (blood work) drawn today and your tests are completely normal, you will receive your results only by: . MyChart Message (if you have MyChart) OR . A paper copy in the mail If you have any lab test that is abnormal or we need to change your treatment, we will call you to review the results.  Testing/Procedures: Your physician has requested that you have an echocardiogram. Echocardiography is a painless test that uses sound waves to create images of your heart. It provides your doctor with information about the size and shape of your heart and how well your heart's chambers and valves are working. This procedure takes approximately one hour. There are no restrictions for this procedure.  Follow-Up: At [facility], you and your health needs are our priority.  As part of our continuing mission to provide you with exceptional heart care, we have created designated Provider Care Teams.  These Care Teams include your primary Cardiologist (physician) and Advanced Practice Providers (APPs -  Physician Assistants and Nurse Practitioners) who all work together to provide you with the care you need, when you need it.  We recommend signing up for the patient portal called "MyChart".  Sign up information is provided on this After Visit Summary.  MyChart is used to connect with patients for Virtual Visits (Telemedicine).  Patients are able to view lab/test results, encounter notes, upcoming appointments, etc.  Non-urgent messages can be sent to your provider as well.   To learn more about what you can do with MyChart, go to [email or web address].com.    Your next appointment:   12 month(s)  The format for your next appointment:   In Person  Provider:   You may see Peter Nishan, MD or one of the following Advanced Practice Providers on your  designated Care Team:    Lori Gerhardt, NP  Laura Ingold, NP  Jill McDaniel, NP  

## 2019-12-17 ENCOUNTER — Ambulatory Visit (HOSPITAL_COMMUNITY): Payer: Medicare HMO | Attending: Cardiovascular Disease

## 2019-12-17 ENCOUNTER — Other Ambulatory Visit: Payer: Self-pay

## 2019-12-17 DIAGNOSIS — R0600 Dyspnea, unspecified: Secondary | ICD-10-CM | POA: Insufficient documentation

## 2019-12-17 DIAGNOSIS — I480 Paroxysmal atrial fibrillation: Secondary | ICD-10-CM | POA: Diagnosis not present

## 2019-12-17 LAB — ECHOCARDIOGRAM COMPLETE
AR max vel: 1.99 cm2
AV Area VTI: 2.18 cm2
AV Area mean vel: 2.18 cm2
AV Mean grad: 4.5 mmHg
AV Peak grad: 8.1 mmHg
Ao pk vel: 1.43 m/s
Area-P 1/2: 2.82 cm2
S' Lateral: 2.3 cm

## 2020-01-13 ENCOUNTER — Telehealth: Payer: Self-pay | Admitting: Cardiovascular Disease

## 2020-01-13 NOTE — Telephone Encounter (Signed)
Pt c/o medication issue:  1. Name of Medication: ELIQUIS 5 MG TABS tablet  2. How are you currently taking this medication (dosage and times per day)? As directed  3. Are you having a reaction (difficulty breathing--STAT)? no  4. What is your medication issue? Patient states that she will probably have to change blood thinners next year due to insurance. She is not sure if she needs to come in for an appointment for this or if she can discuss over the phone with Dr. Fabio Bering nurse or Norma Fredrickson.

## 2020-01-19 MED ORDER — APIXABAN 5 MG PO TABS: 5.0000 mg | ORAL_TABLET | Freq: Two times a day (BID) | ORAL | 1 refills | Status: AC

## 2020-01-19 NOTE — Telephone Encounter (Signed)
Rx for 90 DS and copay card sent to [facility].

## 2020-01-19 NOTE — Telephone Encounter (Signed)
**Note De-Identified Abb Gobert Obfuscation** The pt has a Primary school teacher through [employer] and has been paying reg cost for her Eliquis with State ins coverage but has been made aware that they will not cover Eliquis next year.  I have advised her that I am going to discuss with our pharmacy team to see if we can send information from a Eliquis $10 co-pay card into [place] [facility] at [facility] in [place] and that I will get back in touch with her.

## 2020-01-20 NOTE — Telephone Encounter (Signed)
**Note De-Identified Samyukta Cura Obfuscation** No answer so I left a message on the pts VM asking her to call Larita Fife back at Dr Ricki Miller office at [facility] at [phone].

## 2020-01-20 NOTE — Telephone Encounter (Signed)
**Note De-Identified Trevell Pariseau Obfuscation** The pt is advised that we have sent her Eliquis RX to [facility] to fill using a $10 co-pay card. She is aware to call us if she is charged more than $10 when she picks her Eliquis up. I did recommend that she request her refill from [facility] when she is down to 1 weeks worth of Eliquis so if there is a problem we will have time to solve it.  She verbalized understanding and thanked me for our assistance.

## 2020-02-24 ENCOUNTER — Telehealth: Payer: Self-pay | Admitting: Pharmacist

## 2020-02-24 NOTE — Telephone Encounter (Signed)
Called pt to obtain updated prescription insurance required for Eliquis prior authorization.  Pt has state health plan. ID [id number] BIN [id number] PCN  GRP [id number]  Prior authorization has been submitted.

## 2020-02-24 NOTE — Telephone Encounter (Signed)
Called pt and advised her that Eliquis prior authorization has been approved through 02/23/21. Pt was appreciative for assistance.

## 2020-06-02 ENCOUNTER — Other Ambulatory Visit: Payer: Self-pay | Admitting: Neurosurgery

## 2020-06-02 DIAGNOSIS — M546 Pain in thoracic spine: Secondary | ICD-10-CM

## 2020-06-10 ENCOUNTER — Other Ambulatory Visit: Payer: Self-pay

## 2020-06-10 ENCOUNTER — Ambulatory Visit
Admission: RE | Admit: 2020-06-10 | Discharge: 2020-06-10 | Disposition: A | Discharge status: Home / Self Care | Payer: Medicare HMO | Source: Ambulatory Visit | Attending: Neurosurgery | Admitting: Neurosurgery

## 2020-06-10 DIAGNOSIS — M546 Pain in thoracic spine: Secondary | ICD-10-CM

## 2020-12-20 NOTE — Progress Notes (Signed)
CARDIOLOGY OFFICE NOTE  Date:  12/30/2020    Erin [name] Date of Birth: [birth year] Medical Record #[id number]  PCP:  Caffie Damme, MD  Cardiologist:  Townsend Roger   No chief complaint on file.   History of Present Illness: Erin [name] is a 75 y.o. female f/U CAD/CABG, PAF, HTN, Dyspnea and lung disease    She has had CABG by Dr Dorris Fetch 05/30/16 with LAA clipping. Her course was complicated by [facility] sepsis and paravertebral abscess post lumbar laminectomy from March os 2018 with revision by Dr Wynetta Emery 06/26/16.  She had recurrent PAF - was placed back on Eliquis and was loaded with amiodarone and has had TEE cardioversion 07/04/16 and there was thrombus in her LAA but clip was intact with no communication to her LA and no other thrombus. She went back into rapid atrial flutter with rates in the 120s. She was symptomatic with palpitations. She required repeat TEE cardioversion 07/2016 back to NSR. EF has dropped to 40-45% with moderate MR.  She was readmitted towards the end of July by Dr. Wynetta Emery and had  "exploration of fusion removal of hardware L2-S1 with removal of bilateral loose L2 pedicle screws which were sent for culture and removal of rods and all top tightening nuts and cross-link".  She did ok with this surgery. Tolerated ok   BP improved with CCB and hydralazine Dyspnea from lung dx with echo 11/2019 normal EF   PFT;s 12/17/17 with restriction and DLCO corrects for alveolar volume CT with small vessel disease Elevated right hemi diaphragm and no ILD  Dr Delton Coombes saw on 12/20/17 and felt dyspnea from deconditioning and presumed new right hemidiaphragm elevation. Sniff test was not diagnostic of paralysis but had decreased inferior excursion Referred to [facility] for pulmonary rehab   TTE done 12/17/19 with normal EF and AV sclerosis   Some stress from legal action on her back complications Occasional palpitations but  Nothing sustained Discussed need for hospitalization and Tikosyn  should she have Documented sustained afib/PAF   Son is a big [place] booster with tailgate row [address] near [facility]   Past Medical History:  Diagnosis Date   A-fib [facility])    Anxiety    Arthritis    "all my back is eat up w/it; knees too" (05/24/2016)   CAD (coronary artery disease)    s/p CABG   Chronic bronchitis (HCC)    Chronic lower back pain    Depression    Dyspnea    "since OR 04/2016" (05/24/2016)   Dysrhythmia    Family history of adverse reaction to anesthesia    "daughter gets PONV" (05/24/2016)   GERD (gastroesophageal reflux disease)    occ   High cholesterol    History of stomach ulcers    Hypertension    Hypothyroidism    Migraine    "usually have one monthly; nothing since 04/25/2016)   Pneumonia ~ 2002    Past Surgical History:  Procedure Laterality Date   ANTERIOR CERVICAL DECOMP/DISCECTOMY FUSION  ~ 2009   BACK SURGERY     BACK SURGERY     JUNE  2018   BLEPHAROPLASTY     CARDIOVERSION N/A 07/04/2016   Procedure: CARDIOVERSION;  Surgeon: Wendall Stade, MD;  Location: [facility] ENDOSCOPY;  Service: Cardiovascular;  Laterality: N/A;   CARDIOVERSION N/A 07/30/2016   Procedure: CARDIOVERSION;  Surgeon: Wendall Stade, MD;  Location: [facility] ENDOSCOPY;  Service: Cardiovascular;  Laterality: N/A;   CARPAL TUNNEL RELEASE  Bilateral 80's   CATARACT EXTRACTION, BILATERAL     CORONARY ARTERY BYPASS GRAFT N/A 05/30/2016   Procedure: CORONARY ARTERY BYPASS GRAFTING (CABG), ON PUMP, TIMES FOUR, USING LEFT INTERNAL MAMMARY ARTERY AND ENDOSCOPICALLY HARVESTED BILATERAL GREATER SAPHENOUS VEINS WITH CLIPPING OF LEFT ATRIAL APPENDAGE;  Surgeon: Melrose Nakayama, MD;  Location: [place];  Service: Open Heart Surgery;  Laterality: N/A;  LIMA to LAD, SVG to RAMUS INTERMEDIATE, and SVG SEQUENTIALLY to CIRCUMFLEX and PDA   IR FLUORO GUIDED NEEDLE PLC ASPIRATION/INJECTION LOC  07/12/2016   KNEE ARTHROSCOPY Bilateral 2000s   LEFT HEART CATH AND CORONARY ANGIOGRAPHY N/A 05/29/2016   Procedure: Left  Heart Cath and Coronary Angiography;  Surgeon: Troy Sine, MD;  Location: [place] CV LAB;  Service: Cardiovascular;  Laterality: N/A;   LUMBAR [place] SURGERY  2006; 07/2005   LUMBAR WOUND DEBRIDEMENT N/A 06/26/2016   Procedure: Incision and Drainage of LUMBAR WOUND;  Surgeon: Kary Kos, MD;  Location: [place];  Service: Neurosurgery;  Laterality: N/A;   POSTERIOR LUMBAR FUSION  04/2016   TEE WITHOUT CARDIOVERSION N/A 07/04/2016   Procedure: TRANSESOPHAGEAL ECHOCARDIOGRAM (TEE);  Surgeon: Josue Hector, MD;  Location: [facility] ENDOSCOPY;  Service: Cardiovascular;  Laterality: N/A;   TEE WITHOUT CARDIOVERSION N/A 07/30/2016   Procedure: TRANSESOPHAGEAL ECHOCARDIOGRAM (TEE);  Surgeon: Josue Hector, MD;  Location: [facility] ENDOSCOPY;  Service: Cardiovascular;  Laterality: N/A;   TONSILLECTOMY     TUBAL LIGATION       Medications: Current Meds  Medication Sig   acetaminophen (TYLENOL) 325 MG tablet Take 650 mg by mouth every 4 (four) hours as needed for mild pain or fever.   apixaban (ELIQUIS) 5 MG TABS tablet Take 1 tablet (5 mg total) by mouth 2 (two) times daily.   aspirin EC 81 MG tablet Take 81 mg by mouth daily.   Biotin 5 MG TABS Take by mouth.   cyclobenzaprine (FLEXERIL) 10 MG tablet Take 1 tablet (10 mg total) by mouth 3 (three) times daily as needed for muscle spasms.   cycloSPORINE (RESTASIS) 0.05 % ophthalmic emulsion Place 1 drop into both eyes 2 (two) times daily.   diltiazem (CARDIZEM) 60 MG tablet Take 1 tablet (60 mg total) by mouth 4 (four) times daily.   furosemide (LASIX) 40 MG tablet Take 40 mg by mouth daily.   hydrALAZINE (APRESOLINE) 25 MG tablet Take 25 mg by mouth 2 (two) times daily.   levothyroxine (SYNTHROID) 100 MCG tablet    pantoprazole (PROTONIX) 40 MG tablet Take 1 tablet (40 mg total) by mouth at bedtime.   spironolactone (ALDACTONE) 25 MG tablet Take 1 tablet by mouth once daily   traMADol (ULTRAM) 50 MG tablet Take by mouth as needed.      Allergies: Allergies  Allergen Reactions   Ace Inhibitors Swelling and Other (See Comments)    ACE stopped after pt seen in ED with facial swelling- allergy testing pending   Ambien [Zolpidem] Other (See Comments)    confusion   Fentanyl Nausea And Vomiting and Other (See Comments)    PATCH      Morphine And Related Other (See Comments)    Unknown   Codeine Nausea And Vomiting    Social History: The patient  reports that she has never smoked. She has never used smokeless tobacco. She reports that she does not drink alcohol and does not use drugs.   Family History: The patient's family history includes CAD in her sister and son.   Review of Systems:  Please see the history of present illness.   Otherwise, the review of systems is positive for none.   All other systems are reviewed and negative.   Physical Exam: VS:  BP 116/72   Pulse 81   Ht 5\' 6"  (1.676 m)   Wt 157 lb (71.2 kg)   LMP  (LMP Unknown)   SpO2 97%   BMI 25.34 kg/m  .  BMI Body mass index is 25.34 kg/m.  Wt Readings from Last 3 Encounters:  12/30/20 157 lb (71.2 kg)  11/26/19 162 lb (73.5 kg)  11/19/18 171 lb 12.8 oz (77.9 kg)   Affect appropriate Healthy:  appears stated age HEENT: normal Neck supple with no adenopathy JVP normal no bruits no thyromegaly Lungs decreased BS and diaphragmatic motion right base  Heart:  S1/S2 SEM AV sclerosis murmur, no rub, gallop or click PMI normal post sternotomy  Abdomen: benighn, BS positve, no tenderness, no AAA no bruit.  No HSM or HJR Distal pulses intact with no bruits No edema Neuro non-focal Skin warm and dry No muscular weakness    LABORATORY DATA:  EKG:   SR rate 87 PAC otherwise normal   Lab Results  Component Value Date   WBC 5.5 11/26/2017   HGB 13.9 11/26/2017   HCT 39.8 11/26/2017   PLT 169 11/26/2017   GLUCOSE 113 (H) 12/25/2017   ALT 17 11/26/2017   AST 23 11/26/2017   NA 140 12/25/2017   K 3.9 12/25/2017   CL 100 12/25/2017    CREATININE 1.10 (H) 12/25/2017   BUN 16 12/25/2017   CO2 24 12/25/2017   TSH 1.460 11/26/2017   INR 1.03 09/05/2016   HGBA1C 5.9 (H) 05/30/2016     BNP (last 3 results) No results for input(s): BNP in the last 8760 hours.  ProBNP (last 3 results) No results for input(s): PROBNP in the last 8760 hours.   Other Studies Reviewed Today:  CXR IMPRESSION: 1. Stable mild. 2. 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 No active lung disease.     Electronically Signed   By: Dwyane Dee M.D.   On: 12/10/2017 08:17   Echo Study Conclusions 11/2017   - Left ventricle: The cavity size was normal. Wall thickness was   increased in a pattern of moderate LVH. Systolic function was   normal. The estimated ejection fraction was in the range of 60%   to 65%. Wall motion was normal; there were no regional wall   motion abnormalities. Doppler parameters are consistent with   abnormal left ventricular relaxation (grade 1 diastolic   dysfunction). - Pulmonary arteries: PA peak pressure: 32 mm Hg (S).  Notes recorded by Wendall Stade, MD on 12/17/2017 at 5:35 PM EDT EF normal no significant valve disease overall good   Assessment/Plan:   1. 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 HTN - Well controlled.  Continue current medications and low sodium Dash type diet.    3. PAF - Amiodarone d/c in NSR on Eliquis  CHAD2VASC 5 also had LAA clipping during CABG    4. Chronic anticoagulation - remains on Eliquis along with aspirin - lab earlier this month stable.    5. Chronic systolic HF - No ACE/ARB due to facial swelling - would not be candidate for Entresto either. EF is normal by echo 11/2019 . See above  6. CAD - prior CABG 05/30/16  - she has had no angina don't think ischemic evaluation needed at this time    7. Hypothyroidism - she  remains on replacment per PCP   8. Prior back surgery with sepsis/epidural abscess and repeat surgery. She is trying to continue PT.      Current medicines are reviewed with the patient today.  The patient does not have concerns regarding medicines other than what has been noted above.  The following changes have been made:  See above.  Labs/ tests ordered today include: None  No orders of the defined types were placed in this encounter.    Disposition:   FU with me in a year I   Patient is agreeable to this plan and will call if any problems develop in the interim.   Signed: Jenkins Rouge, MD  12/30/2020 8:12 AM  [facility] [address] [place], [place]  [address] Phone: [phone] Fax: [phone]

## 2020-12-30 ENCOUNTER — Encounter: Payer: Self-pay | Admitting: Cardiovascular Disease

## 2020-12-30 ENCOUNTER — Other Ambulatory Visit: Payer: Self-pay

## 2020-12-30 ENCOUNTER — Ambulatory Visit (INDEPENDENT_AMBULATORY_CARE_PROVIDER_SITE_OTHER): Payer: Medicare HMO | Admitting: Cardiovascular Disease

## 2020-12-30 VITALS — BP 116/72 | HR 81 | Ht 66.0 in | Wt 157.0 lb

## 2020-12-30 DIAGNOSIS — Z951 Presence of aortocoronary bypass graft: Secondary | ICD-10-CM | POA: Diagnosis not present

## 2020-12-30 DIAGNOSIS — I480 Paroxysmal atrial fibrillation: Secondary | ICD-10-CM

## 2020-12-30 NOTE — Patient Instructions (Addendum)
Medication Instructions:  °*If you need a refill on your cardiac medications before your next appointment, please call your pharmacy* ° °Lab Work: °If you have labs (blood work) drawn today and your tests are completely normal, you will receive your results only by: °MyChart Message (if you have MyChart) OR °A paper copy in the mail °If you have any lab test that is abnormal or we need to change your treatment, we will call you to review the results. ° °Testing/Procedures: °None ordered today. ° °Follow-Up: °At [facility], you and your health needs are our priority.  As part of our continuing mission to provide you with exceptional heart care, we have created designated Provider Care Teams.  These Care Teams include your primary Cardiologist (physician) and Advanced Practice Providers (APPs -  Physician Assistants and Nurse Practitioners) who all work together to provide you with the care you need, when you need it. ° °We recommend signing up for the patient portal called "MyChart".  Sign up information is provided on this After Visit Summary.  MyChart is used to connect with patients for Virtual Visits (Telemedicine).  Patients are able to view lab/test results, encounter notes, upcoming appointments, etc.  Non-urgent messages can be sent to your provider as well.   °To learn more about what you can do with MyChart, go to [email or web address].   ° °Your next appointment:   °6 month(s) ° °The format for your next appointment:   °In Person ° °Provider:   °Peter Nishan, MD { ° °

## 2021-06-22 NOTE — Progress Notes (Signed)
? ? ? ?CARDIOLOGY OFFICE NOTE ? ?Date:  07/05/2021  ? ? ?Erin [name] ?Date of Birth: 09-10-1945 ?Medical Record [id number] ? ?PCP:  Glendon Axe, [name]  ?Cardiologist:  Erin [name]  ? ?No chief complaint on file. ? ? ? ?History of Present Illness: ?Erin [name] is a 76 y.o. female f/U CAD/CABG, PAF, HTN, Dyspnea and lung disease  ?  ?She has had CABG by Erin [name] 05/30/16 with LAA clipping. Her course was complicated by [facility] sepsis and paravertebral abscess post lumbar laminectomy from March os 2018 with revision by Erin [name] 06/26/16.  She had recurrent PAF - was placed back on Eliquis and was loaded with amiodarone and has had TEE cardioversion 07/04/16 and there was thrombus in her LAA but clip was intact with no communication to her LA and no other thrombus. She went back into rapid atrial flutter with rates in the 120s. She was symptomatic with palpitations. She required repeat TEE cardioversion 07/2016 back to NSR. EF has dropped to 40-45% with moderate MR.  She was readmitted towards the end of July by Erin. Saintclair [name] and had  "exploration of fusion removal of hardware L2-S1 with removal of bilateral loose L2 pedicle screws which were sent for culture and removal of rods and all top tightening nuts and cross-link".  She did ok with this surgery. Tolerated ok  ? ?BP improved with CCB and hydralazine Dyspnea from lung dx with echo 11/2019 normal EF  ? ?PFT;s 12/17/17 with restriction and DLCO corrects for alveolar volume CT with small vessel disease Elevated right hemi diaphragm and no ILD  Erin [name] saw on 12/20/17 and felt dyspnea from deconditioning and presumed new right hemidiaphragm elevation. Sniff test was not diagnostic of paralysis but had decreased inferior excursion Referred to [facility] for pulmonary rehab  ? ?TTE done 12/17/19 with normal EF and AV sclerosis  ? ?Some stress from legal action on her back complications Occasional palpitations but  ?Nothing sustained Discussed need for hospitalization and  Tikosyn should she have ?Documented sustained afib/PAF  ? ?Son is a big [place] booster with [employer] [place] [address] near [facility] ? ?In afib in office today  Documented by ECG showing flutter with rate 118 discussed changing to Cardizem CD 300 mg long acting She continues to want to defer hospitalization for Tikosyn.  ? ? ?Past Medical History:  ?Diagnosis Date  ? A-fib ([place])   ? Anxiety   ? Arthritis   ? "all my back is eat up w/it; knees too" (05/24/2016)  ? CAD (coronary artery disease)   ? s/p CABG  ? Chronic bronchitis ([place])   ? Chronic lower back pain   ? Depression   ? Dyspnea   ? "since OR 04/2016" (05/24/2016)  ? Dysrhythmia   ? Family history of adverse reaction to anesthesia   ? "daughter gets PONV" (05/24/2016)  ? GERD (gastroesophageal reflux disease)   ? occ  ? High cholesterol   ? History of stomach ulcers   ? Hypertension   ? Hypothyroidism   ? Migraine   ? "usually have one monthly; nothing since 04/25/2016)  ? Pneumonia ~ 2002  ? ? ?Past Surgical History:  ?Procedure Laterality Date  ? ANTERIOR CERVICAL DECOMP/DISCECTOMY FUSION  ~ 2009  ? BACK SURGERY    ? BACK SURGERY    ? JUNE  2018  ? BLEPHAROPLASTY    ? CARDIOVERSION N/A 07/04/2016  ? Procedure: CARDIOVERSION;  Surgeon: Erin [name];  Location: [place] ENDOSCOPY;  Service: Cardiovascular;  Laterality: N/A;  ? CARDIOVERSION N/A 07/30/2016  ? Procedure: CARDIOVERSION;  Surgeon: Erin [name];  Location: [facility] ENDOSCOPY;  Service: Cardiovascular;  Laterality: N/A;  ? CARPAL TUNNEL RELEASE Bilateral 80's  ? CATARACT EXTRACTION, BILATERAL    ? CORONARY ARTERY BYPASS GRAFT N/A 05/30/2016  ? Procedure: CORONARY ARTERY BYPASS GRAFTING (CABG), ON PUMP, TIMES FOUR, USING LEFT INTERNAL MAMMARY ARTERY AND ENDOSCOPICALLY HARVESTED BILATERAL GREATER SAPHENOUS VEINS WITH CLIPPING OF LEFT ATRIAL APPENDAGE;  Surgeon: Erin [name];  Location: [facility] OR;  Service: Open Heart Surgery;  Laterality: N/A;  LIMA to LAD, SVG to RAMUS INTERMEDIATE, and SVG SEQUENTIALLY to  CIRCUMFLEX and PDA  ? IR FLUORO GUIDED NEEDLE PLC ASPIRATION/INJECTION LOC  07/12/2016  ? KNEE ARTHROSCOPY Bilateral 2000s  ? LEFT HEART CATH AND CORONARY ANGIOGRAPHY N/A 05/29/2016  ? Procedure: Left Heart Cath and Coronary Angiography;  Surgeon: Erin [name];  Location: [facility] INVASIVE CV LAB;  Service: Cardiovascular;  Laterality: N/A;  ? LUMBAR DISC SURGERY  2006; 07/2005  ? LUMBAR WOUND DEBRIDEMENT N/A 06/26/2016  ? Procedure: Incision and Drainage of LUMBAR WOUND;  Surgeon: Erin [name];  Location: [facility] OR;  Service: Neurosurgery;  Laterality: N/A;  ? POSTERIOR LUMBAR FUSION  04/2016  ? TEE WITHOUT CARDIOVERSION N/A 07/04/2016  ? Procedure: TRANSESOPHAGEAL ECHOCARDIOGRAM (TEE);  Surgeon: Erin [name];  Location: [facility] ENDOSCOPY;  Service: Cardiovascular;  Laterality: N/A;  ? TEE WITHOUT CARDIOVERSION N/A 07/30/2016  ? Procedure: TRANSESOPHAGEAL ECHOCARDIOGRAM (TEE);  Surgeon: Erin [name];  [facility] ENDOSCOPY;  Service: Cardiovascular;  Laterality: N/A;  ? TONSILLECTOMY    ? TUBAL LIGATION    ? ? ? ?Medications: ?Current Meds  ?Medication Sig  ? acetaminophen (TYLENOL) 325 MG tablet Take 650 mg by mouth every 4 (four) hours as needed for mild pain or fever.  ? apixaban (ELIQUIS) 5 MG TABS tablet Take 1 tablet (5 mg total) by mouth 2 (two) times daily.  ? atorvastatin (LIPITOR) 80 MG tablet Take 1 tablet (80 mg total) by mouth daily.  ? Biotin 5 MG TABS Take by mouth.  ? cyclobenzaprine (FLEXERIL) 10 MG tablet Take 1 tablet (10 mg total) by mouth 3 (three) times daily as needed for muscle spasms.  ? cycloSPORINE (RESTASIS) 0.05 % ophthalmic emulsion Place 1 drop into both eyes 2 (two) times daily.  ? diltiazem (CARDIZEM) 60 MG tablet Take 1 tablet (60 mg total) by mouth 4 (four) times daily.  ? furosemide (LASIX) 40 MG tablet Take 40 mg by mouth daily.  ? hydrALAZINE (APRESOLINE) 25 MG tablet Take 25 mg by mouth 2 (two) times daily.  ? pantoprazole (PROTONIX) 40 MG tablet Take 1 tablet (40 mg total)  by mouth at bedtime.  ? spironolactone (ALDACTONE) 25 MG tablet Take 1 tablet by mouth once daily  ? [DISCONTINUED] levothyroxine (SYNTHROID) 100 MCG tablet   ? ? ? ?Allergies: ?Allergies  ?Allergen Reactions  ? Ace Inhibitors Swelling and Other (See Comments)  ?  ACE stopped after pt seen in ED with facial swelling- allergy testing pending  ? Ambien [Zolpidem] Other (See Comments)  ?  confusion  ? Fentanyl Nausea And Vomiting and Other (See Comments)  ?  PATCH     ? Morphine And Related Other (See Comments)  ?  Unknown  ? Codeine Nausea And Vomiting  ? ? ?Social History: ?The patient  reports that she has never smoked. She has never used smokeless tobacco. She reports that she does not  drink alcohol and does not use drugs. ?  ?Family History: ?The patient's family history includes CAD in her sister and son.  ? ?Review of Systems: ?Please see the history of present illness.   Otherwise, the review of systems is positive for none.   All other systems are reviewed and negative.  ? ?Physical Exam: ?VS:  BP 116/72   Pulse 92   Ht 5\' 5"  (1.651 m)   Wt 154 lb (69.9 kg)   LMP  (LMP Unknown)   SpO2 93%   BMI 25.63 kg/m?  Marland Kitchen  BMI Body mass index is 25.63 kg/m?. ? ?Wt Readings from Last 3 Encounters:  ?07/05/21 154 lb (69.9 kg)  ?12/30/20 157 lb (71.2 kg)  ?11/26/19 162 lb (73.5 kg)  ? ?Affect appropriate ?Healthy:  appears stated age ?HEENT: normal ?Neck supple with no adenopathy ?JVP normal no bruits no thyromegaly ?Lungs decreased BS and diaphragmatic motion right base  ?Heart:  S1/S2 SEM AV sclerosis murmur, no rub, gallop or click ?PMI normal post sternotomy  ?Abdomen: benighn, BS positve, no tenderness, no AAA ?no bruit.  No HSM or HJR ?Distal pulses intact with no bruits ?No edema ?Neuro non-focal ?Skin warm and dry ?No muscular weakness ? ? ? ?LABORATORY DATA: ? ?EKG:   SR rate 87 PAC otherwise normal  ? ?Lab Results  ?Component Value Date  ? WBC 5.5 11/26/2017  ? HGB 13.9 11/26/2017  ? HCT 39.8 11/26/2017  ? PLT  169 11/26/2017  ? GLUCOSE 113 (H) 12/25/2017  ? ALT 17 11/26/2017  ? AST 23 11/26/2017  ? NA 140 12/25/2017  ? K 3.9 12/25/2017  ? CL 100 12/25/2017  ? CREATININE 1.10 (H) 12/25/2017  ? BUN 16 12/25/2017  ? CO2 24 11

## 2021-07-05 ENCOUNTER — Ambulatory Visit (INDEPENDENT_AMBULATORY_CARE_PROVIDER_SITE_OTHER): Payer: Medicare HMO

## 2021-07-05 ENCOUNTER — Encounter: Payer: Self-pay | Admitting: Cardiovascular Disease

## 2021-07-05 ENCOUNTER — Ambulatory Visit (INDEPENDENT_AMBULATORY_CARE_PROVIDER_SITE_OTHER): Payer: Medicare HMO | Admitting: Cardiovascular Disease

## 2021-07-05 VITALS — BP 116/72 | HR 92 | Ht 65.0 in | Wt 154.0 lb

## 2021-07-05 DIAGNOSIS — Z951 Presence of aortocoronary bypass graft: Secondary | ICD-10-CM

## 2021-07-05 DIAGNOSIS — I480 Paroxysmal atrial fibrillation: Secondary | ICD-10-CM

## 2021-07-05 MED ORDER — DILTIAZEM HCL ER COATED BEADS 300 MG PO CP24: 300.0000 mg | ORAL_CAPSULE | Freq: Every day | ORAL | 3 refills | Status: AC

## 2021-07-05 NOTE — Patient Instructions (Addendum)
Medication Instructions:  ?Your physician has recommended you make the following change in your medication:  ? ?1-START Cardizem 300 mg by mouth daily. ?2-STOP Cardizem 60 mg ? ?*If you need a refill on your cardiac medications before your next appointment, please call your pharmacy* ? ?Lab Work: ?If you have labs (blood work) drawn today and your tests are completely normal, you will receive your results only by: ?MyChart Message (if you have MyChart) OR ?A paper copy in the mail ?If you have any lab test that is abnormal or we need to change your treatment, we will call you to review the results. ? ?Testing/Procedures: ?Your physician has recommended that you wear an event monitor. Event monitors are medical devices that record the heart?s electrical activity. Doctors most often [place] these monitors to diagnose arrhythmias. Arrhythmias are problems with the speed or rhythm of the heartbeat. The monitor is a small, portable device. You can wear one while you do your normal daily activities. This is usually used to diagnose what is causing palpitations/syncope (passing out). ? ?Follow-Up: ?At [facility], you and your health needs are our priority.  As part of our continuing mission to provide you with exceptional heart care, we have created designated Provider Care Teams.  These Care Teams include your primary Cardiologist (physician) and Advanced Practice Providers (APPs -  Physician Assistants and Nurse Practitioners) who all work together to provide you with the care you need, when you need it. ? ?We recommend signing up for the patient portal called "MyChart".  Sign up information is provided on this After Visit Summary.  MyChart is used to connect with patients for Virtual Visits (Telemedicine).  Patients are able to view lab/test results, encounter notes, upcoming appointments, etc.  Non-urgent messages can be sent to your provider as well.   ?To learn more about what you can do with MyChart, go to  [email or web address].   ? ?Your next appointment:   ?1 year(s) ? ?The format for your next appointment:   ?In Person ? ?Provider:   ?Charlton Haws, MD { ? ?You have been referred to [facility] ? ?Important Information About Sugar ? ? ? ? ?  ?

## 2021-07-10 ENCOUNTER — Telehealth: Payer: Self-pay

## 2021-07-10 NOTE — Telephone Encounter (Signed)
   Cardiac Monitor Alert  Date of alert:  07/10/2021   Patient Name: Erin [name]  DOB: [birth year]  MRN: [id number]   [facility] Cardiologist: Charlton Haws, MD  [facility] EP:  None    Monitor Information: Cardiac Event Monitor [[employer]]  Reason:  PAF Ordering provider:  Dr. Eden Emms { Alert Atrial Fibrillation/Flutter This is the  1st report on this monitor for this rhythm.  The patient has a hx of Atrial Fibrillation/Flutter.  The patient is currently on anticoagulation. Anticoagulation medication as of 07/10/2021           apixaban (ELIQUIS) 5 MG TABS tablet Take 1 tablet (5 mg total) by mouth 2 (two) times daily.       Next Cardiology Appointment {   Date:  08/15/21  Provider:  Rudi Coco NP  The patient could NOT be reached by telephone today.  07/10/21 The patient was referred to the Atrial Fibrillation Clinic.  Appt date/time:  08/15/21    Other: Per Dr. Fabio Bering office note on 07/05/21,  "PAF - Amiodarone d/c On Eliquis  CHAD2VASC 5 also had LAA clipping during CABG She does not want to be hospitalized for Tikosyn Change to LA cardiazem 30 day monitor to see if paroxysmal if persistent will insist on hospitalization".    Ethelda Chick, RN  07/10/2021 1:40 PM

## 2021-08-02 ENCOUNTER — Telehealth: Payer: Self-pay | Admitting: Physician Assistant

## 2021-08-02 NOTE — Telephone Encounter (Signed)
Paged by [employer].  Patient currently wearing monitor.  It was auto trigger event.  Patient had 8 beats of nonsustained VT. Underlying rhythm was atrial fibrillation.  Last day to wear monitor is tomorrow. Will send strip for review.

## 2021-08-15 ENCOUNTER — Encounter (HOSPITAL_COMMUNITY): Payer: Self-pay | Admitting: Nurse Practitioner

## 2021-08-15 ENCOUNTER — Ambulatory Visit (HOSPITAL_COMMUNITY)
Admission: RE | Admit: 2021-08-15 | Discharge: 2021-08-15 | Disposition: A | Discharge status: Home / Self Care | Payer: Medicare HMO | Source: Ambulatory Visit | Attending: Nurse Practitioner | Admitting: Nurse Practitioner

## 2021-08-15 VITALS — BP 114/72 | HR 109 | Ht 65.0 in | Wt 148.8 lb

## 2021-08-15 DIAGNOSIS — Z79899 Other long term (current) drug therapy: Secondary | ICD-10-CM | POA: Diagnosis not present

## 2021-08-15 DIAGNOSIS — D6869 Other thrombophilia: Secondary | ICD-10-CM | POA: Diagnosis not present

## 2021-08-15 DIAGNOSIS — Z7901 Long term (current) use of anticoagulants: Secondary | ICD-10-CM | POA: Insufficient documentation

## 2021-08-15 DIAGNOSIS — I509 Heart failure, unspecified: Secondary | ICD-10-CM | POA: Insufficient documentation

## 2021-08-15 DIAGNOSIS — I4819 Other persistent atrial fibrillation: Secondary | ICD-10-CM

## 2021-08-15 DIAGNOSIS — I2510 Atherosclerotic heart disease of native coronary artery without angina pectoris: Secondary | ICD-10-CM | POA: Insufficient documentation

## 2021-08-15 DIAGNOSIS — I480 Paroxysmal atrial fibrillation: Secondary | ICD-10-CM | POA: Insufficient documentation

## 2021-08-15 DIAGNOSIS — I110 Hypertensive heart disease with heart failure: Secondary | ICD-10-CM | POA: Insufficient documentation

## 2021-08-15 LAB — BASIC METABOLIC PANEL
Anion gap: 13 (ref 5–15)
BUN: 12 mg/dL (ref 8–23)
CO2: 24 mmol/L (ref 22–32)
Calcium: 9.2 mg/dL (ref 8.9–10.3)
Chloride: 101 mmol/L (ref 98–111)
Creatinine, Ser: 1.14 mg/dL — ABNORMAL HIGH (ref 0.44–1.00)
GFR, Estimated: 49 mL/min — ABNORMAL LOW (ref >60)
Glucose, Bld: 110 mg/dL — ABNORMAL HIGH (ref 70–99)
Potassium: 4.3 mmol/L (ref 3.5–5.1)
Sodium: 138 mmol/L (ref 135–145)

## 2021-08-15 LAB — MAGNESIUM: Magnesium: 2.1 mg/dL (ref 1.7–2.4)

## 2021-08-17 ENCOUNTER — Telehealth: Payer: Self-pay | Admitting: Pharmacist

## 2021-08-17 NOTE — Addendum Note (Signed)
Addended by: Shona Simpson on: 08/17/2021 11:19 AM   Modules accepted: Orders

## 2021-08-17 NOTE — Telephone Encounter (Signed)
Pt notified regarding no zofran use with tikosyn.

## 2021-08-17 NOTE — Telephone Encounter (Signed)
Medication list reviewed in anticipation of upcoming Tikosyn initiation. Patient is taking Zofran prn which is QTc prolonging, recommend she avoid this if possible. Otherwise, she is not taking any contraindicated medications.   Patient is anticoagulated on Eliquis 5mg  BID on the appropriate dose. Please ensure that patient has not missed any anticoagulation doses in the 3 weeks prior to Tikosyn initiation.   Patient will need to be counseled to avoid use of Benadryl while on Tikosyn and in the 2-3 days prior to Tikosyn initiation.

## 2021-08-29 ENCOUNTER — Telehealth (HOSPITAL_COMMUNITY): Payer: Self-pay

## 2021-08-29 NOTE — Telephone Encounter (Signed)
Patient called to make sure it is okay for her to have a Cortisone injection in her right knee on Wednesday. Per Jorja Loa PA it is okay to have this procedure performed. This will not interfere with her upcoming Tikosyn admission. Patient wanted me to contact [employer] to make sure this preadmission for Tikosyn is covered under her plan. Contacted [employer] and her preadmission does not require precert. Notified patient and she verbalized understanding.

## 2021-09-11 ENCOUNTER — Ambulatory Visit (HOSPITAL_COMMUNITY)
Admission: RE | Admit: 2021-09-11 | Discharge: 2021-09-11 | Disposition: A | Discharge status: Home / Self Care | Payer: Medicare HMO | Source: Ambulatory Visit | Attending: Physician Assistant | Admitting: Physician Assistant

## 2021-09-11 ENCOUNTER — Other Ambulatory Visit: Payer: Self-pay

## 2021-09-11 ENCOUNTER — Other Ambulatory Visit (HOSPITAL_COMMUNITY): Payer: Self-pay

## 2021-09-11 ENCOUNTER — Telehealth (HOSPITAL_COMMUNITY): Payer: Self-pay | Admitting: Pharmacy Technician

## 2021-09-11 ENCOUNTER — Inpatient Hospital Stay (HOSPITAL_COMMUNITY)
Admission: AD | Admit: 2021-09-11 | Discharge: 2021-09-14 | DRG: 309 | Disposition: A | Discharge status: Home / Self Care | Payer: Medicare HMO | Attending: Cardiology | Admitting: Cardiology

## 2021-09-11 VITALS — BP 118/64 | HR 120 | Ht 65.0 in | Wt 148.2 lb

## 2021-09-11 DIAGNOSIS — Z9842 Cataract extraction status, left eye: Secondary | ICD-10-CM

## 2021-09-11 DIAGNOSIS — I2510 Atherosclerotic heart disease of native coronary artery without angina pectoris: Secondary | ICD-10-CM | POA: Diagnosis present

## 2021-09-11 DIAGNOSIS — Z9851 Tubal ligation status: Secondary | ICD-10-CM | POA: Diagnosis not present

## 2021-09-11 DIAGNOSIS — N1831 Chronic kidney disease, stage 3a: Secondary | ICD-10-CM | POA: Diagnosis present

## 2021-09-11 DIAGNOSIS — E7800 Pure hypercholesterolemia, unspecified: Secondary | ICD-10-CM | POA: Diagnosis present

## 2021-09-11 DIAGNOSIS — Z9841 Cataract extraction status, right eye: Secondary | ICD-10-CM | POA: Diagnosis not present

## 2021-09-11 DIAGNOSIS — Z79890 Hormone replacement therapy (postmenopausal): Secondary | ICD-10-CM | POA: Diagnosis not present

## 2021-09-11 DIAGNOSIS — Z888 Allergy status to other drugs, medicaments and biological substances status: Secondary | ICD-10-CM | POA: Diagnosis not present

## 2021-09-11 DIAGNOSIS — Z7901 Long term (current) use of anticoagulants: Secondary | ICD-10-CM | POA: Diagnosis not present

## 2021-09-11 DIAGNOSIS — Z981 Arthrodesis status: Secondary | ICD-10-CM | POA: Diagnosis not present

## 2021-09-11 DIAGNOSIS — I4819 Other persistent atrial fibrillation: Principal | ICD-10-CM | POA: Diagnosis present

## 2021-09-11 DIAGNOSIS — F419 Anxiety disorder, unspecified: Secondary | ICD-10-CM | POA: Diagnosis present

## 2021-09-11 DIAGNOSIS — Z885 Allergy status to narcotic agent status: Secondary | ICD-10-CM

## 2021-09-11 DIAGNOSIS — J42 Unspecified chronic bronchitis: Secondary | ICD-10-CM | POA: Diagnosis present

## 2021-09-11 DIAGNOSIS — Z8711 Personal history of peptic ulcer disease: Secondary | ICD-10-CM | POA: Diagnosis not present

## 2021-09-11 DIAGNOSIS — Z8249 Family history of ischemic heart disease and other diseases of the circulatory system: Secondary | ICD-10-CM

## 2021-09-11 DIAGNOSIS — I130 Hypertensive heart and chronic kidney disease with heart failure and stage 1 through stage 4 chronic kidney disease, or unspecified chronic kidney disease: Secondary | ICD-10-CM | POA: Diagnosis present

## 2021-09-11 DIAGNOSIS — Z951 Presence of aortocoronary bypass graft: Secondary | ICD-10-CM | POA: Diagnosis not present

## 2021-09-11 DIAGNOSIS — F32A Depression, unspecified: Secondary | ICD-10-CM | POA: Diagnosis present

## 2021-09-11 DIAGNOSIS — I509 Heart failure, unspecified: Secondary | ICD-10-CM | POA: Diagnosis not present

## 2021-09-11 DIAGNOSIS — I5032 Chronic diastolic (congestive) heart failure: Secondary | ICD-10-CM | POA: Diagnosis present

## 2021-09-11 DIAGNOSIS — E039 Hypothyroidism, unspecified: Secondary | ICD-10-CM | POA: Diagnosis present

## 2021-09-11 DIAGNOSIS — K219 Gastro-esophageal reflux disease without esophagitis: Secondary | ICD-10-CM | POA: Diagnosis present

## 2021-09-11 DIAGNOSIS — Z79899 Other long term (current) drug therapy: Secondary | ICD-10-CM

## 2021-09-11 DIAGNOSIS — I4891 Unspecified atrial fibrillation: Secondary | ICD-10-CM | POA: Diagnosis not present

## 2021-09-11 DIAGNOSIS — I110 Hypertensive heart disease with heart failure: Secondary | ICD-10-CM | POA: Diagnosis not present

## 2021-09-11 DIAGNOSIS — D6869 Other thrombophilia: Secondary | ICD-10-CM

## 2021-09-11 LAB — BASIC METABOLIC PANEL
Anion gap: 8 (ref 5–15)
Anion gap: 8 (ref 5–15)
BUN: 22 mg/dL (ref 8–23)
BUN: 23 mg/dL (ref 8–23)
CO2: 30 mmol/L (ref 22–32)
CO2: 28 mmol/L (ref 22–32)
Calcium: 9.4 mg/dL (ref 8.9–10.3)
Calcium: 9.2 mg/dL (ref 8.9–10.3)
Chloride: 103 mmol/L (ref 98–111)
Chloride: 102 mmol/L (ref 98–111)
Creatinine, Ser: 1.19 mg/dL — ABNORMAL HIGH (ref 0.44–1.00)
Creatinine, Ser: 1.21 mg/dL — ABNORMAL HIGH (ref 0.44–1.00)
GFR, Estimated: 47 mL/min — ABNORMAL LOW (ref >60)
GFR, Estimated: 46 mL/min — ABNORMAL LOW (ref >60)
Glucose, Bld: 101 mg/dL — ABNORMAL HIGH (ref 70–99)
Glucose, Bld: 107 mg/dL — ABNORMAL HIGH (ref 70–99)
Potassium: 5.1 mmol/L (ref 3.5–5.1)
Potassium: 4.2 mmol/L (ref 3.5–5.1)
Sodium: 141 mmol/L (ref 135–145)
Sodium: 138 mmol/L (ref 135–145)

## 2021-09-11 LAB — MAGNESIUM: Magnesium: 2.2 mg/dL (ref 1.7–2.4)

## 2021-09-11 MED ORDER — HYDRALAZINE HCL 25 MG PO TABS
25.0000 mg | ORAL_TABLET | Freq: Two times a day (BID) | ORAL | Status: DC
  Administered 2021-09-11 – 2021-09-14 (×6): 25 mg via ORAL
  Filled 2021-09-11 (×7): qty 1

## 2021-09-11 MED ORDER — SODIUM CHLORIDE 0.9% FLUSH
3.0000 mL | Freq: Two times a day (BID) | INTRAVENOUS | Status: DC
  Administered 2021-09-11 – 2021-09-13 (×5): 3 mL via INTRAVENOUS

## 2021-09-11 MED ORDER — SODIUM CHLORIDE 0.9 % IV SOLN: 250.0000 mL | INTRAVENOUS | Status: DC | PRN

## 2021-09-11 MED ORDER — PANTOPRAZOLE SODIUM 40 MG PO TBEC
40.0000 mg | DELAYED_RELEASE_TABLET | Freq: Every day | ORAL | Status: DC
  Administered 2021-09-12 – 2021-09-14 (×3): 40 mg via ORAL
  Filled 2021-09-11 (×3): qty 1

## 2021-09-11 MED ORDER — ATORVASTATIN CALCIUM 80 MG PO TABS
80.0000 mg | ORAL_TABLET | Freq: Every day | ORAL | Status: DC
  Administered 2021-09-11 – 2021-09-14 (×4): 80 mg via ORAL
  Filled 2021-09-11 (×4): qty 1

## 2021-09-11 MED ORDER — BUTALBITAL-APAP-CAFFEINE 50-325-40 MG PO TABS: 1.0000 | ORAL_TABLET | Freq: Four times a day (QID) | ORAL | Status: DC | PRN

## 2021-09-11 MED ORDER — DOFETILIDE 250 MCG PO CAPS
250.0000 ug | ORAL_CAPSULE | Freq: Two times a day (BID) | ORAL | Status: DC
  Administered 2021-09-11 – 2021-09-14 (×6): 250 ug via ORAL
  Filled 2021-09-11 (×6): qty 1

## 2021-09-11 MED ORDER — SPIRONOLACTONE 25 MG PO TABS
25.0000 mg | ORAL_TABLET | Freq: Every day | ORAL | Status: DC
  Administered 2021-09-12 – 2021-09-14 (×3): 25 mg via ORAL
  Filled 2021-09-11 (×4): qty 1

## 2021-09-11 MED ORDER — ALBUTEROL SULFATE HFA 108 (90 BASE) MCG/ACT IN AERS: 2.0000 | INHALATION_SPRAY | RESPIRATORY_TRACT | Status: DC | PRN

## 2021-09-11 MED ORDER — CYCLOSPORINE 0.05 % OP EMUL
1.0000 [drp] | Freq: Two times a day (BID) | OPHTHALMIC | Status: DC
Start: 2021-09-11 — End: 2021-09-14
  Administered 2021-09-11 – 2021-09-13 (×4): 1 [drp] via OPHTHALMIC

## 2021-09-11 MED ORDER — CYCLOBENZAPRINE HCL 10 MG PO TABS: 10.0000 mg | ORAL_TABLET | Freq: Every day | ORAL | Status: AC

## 2021-09-11 MED ORDER — APIXABAN 5 MG PO TABS
5.0000 mg | ORAL_TABLET | Freq: Two times a day (BID) | ORAL | Status: DC
Start: 2021-09-11 — End: 2021-09-14
  Administered 2021-09-11 – 2021-09-14 (×6): 5 mg via ORAL
  Filled 2021-09-11 (×6): qty 1

## 2021-09-11 MED ORDER — ZOLPIDEM TARTRATE 5 MG PO TABS
5.0000 mg | ORAL_TABLET | Freq: Every evening | ORAL | Status: DC | PRN
  Administered 2021-09-11 – 2021-09-13 (×3): 5 mg via ORAL
  Filled 2021-09-11 (×3): qty 1

## 2021-09-11 MED ORDER — FUROSEMIDE 40 MG PO TABS
40.0000 mg | ORAL_TABLET | Freq: Every day | ORAL | Status: DC
  Administered 2021-09-12 – 2021-09-14 (×3): 40 mg via ORAL
  Filled 2021-09-11 (×3): qty 1

## 2021-09-11 MED ORDER — LORATADINE 10 MG PO TABS
10.0000 mg | ORAL_TABLET | Freq: Every day | ORAL | Status: DC
  Administered 2021-09-12 – 2021-09-14 (×3): 10 mg via ORAL
  Filled 2021-09-11 (×3): qty 1

## 2021-09-11 MED ORDER — ACETAMINOPHEN 325 MG PO TABS
650.0000 mg | ORAL_TABLET | ORAL | Status: DC | PRN
Start: 2021-09-11 — End: 2021-09-14
  Administered 2021-09-12 – 2021-09-13 (×3): 650 mg via ORAL
  Filled 2021-09-11 (×4): qty 2

## 2021-09-11 MED ORDER — CYCLOBENZAPRINE HCL 10 MG PO TABS
10.0000 mg | ORAL_TABLET | Freq: Every day | ORAL | Status: DC
  Administered 2021-09-11 – 2021-09-13 (×3): 10 mg via ORAL
  Filled 2021-09-11 (×3): qty 1

## 2021-09-11 MED ORDER — SODIUM CHLORIDE 0.9% FLUSH
3.0000 mL | INTRAVENOUS | Status: DC | PRN
Start: 2021-09-11 — End: 2021-09-14

## 2021-09-11 MED ORDER — MAGNESIUM OXIDE -MG SUPPLEMENT 400 (240 MG) MG PO TABS
ORAL_TABLET | Freq: Every day | ORAL | Status: DC
  Administered 2021-09-12 – 2021-09-14 (×3): 400 mg via ORAL
  Filled 2021-09-11 (×4): qty 1

## 2021-09-11 MED ORDER — DILTIAZEM HCL ER COATED BEADS 180 MG PO CP24
300.0000 mg | ORAL_CAPSULE | Freq: Every day | ORAL | Status: DC
  Administered 2021-09-12 – 2021-09-14 (×3): 300 mg via ORAL
  Filled 2021-09-11 (×3): qty 1

## 2021-09-11 MED ORDER — LEVOTHYROXINE SODIUM 88 MCG PO TABS
88.0000 ug | ORAL_TABLET | Freq: Every day | ORAL | Status: DC
  Administered 2021-09-12 – 2021-09-14 (×3): 88 ug via ORAL
  Filled 2021-09-11 (×3): qty 1

## 2021-09-11 NOTE — Care Management (Signed)
  Transition of Care [facility]) Screening Note   Patient Details  Name: NAKAILA FREEZE Date of Birth: [birth year]   Transition of Care [facility]) CM/SW Contact:    Gala Lewandowsky, RN Phone Number: 09/11/2021, 2:40 PM    Transition of Care Department [facility]) has reviewed the patient and no TOC needs have been identified at this time. Patient presented for a Tikosyn Load. Benefits check submitted for Tikosyn. Case Manager will follow for cost and pharmacy of choice.

## 2021-09-11 NOTE — Progress Notes (Signed)
Pharmacy: Dofetilide (Tikosyn) - Initial Consult Assessment and Electrolyte Replacement  Pharmacy consulted to assist in monitoring and replacing electrolytes in this 76 y.o. female admitted on 09/11/2021 undergoing dofetilide initiation. First dofetilide dose: 7/24 2000.   Assessment:  Patient Exclusion Criteria: If any screening criteria checked as "Yes", then  patient  should NOT receive dofetilide until criteria item is corrected.  If "Yes" please indicate correction plan.  YES  NO Patient  Exclusion Criteria Correction Plan   []   [x]   Baseline QTc interval is greater than or equal to 440 msec. IF above YES box checked dofetilide contraindicated unless patient has ICD; then may proceed if QTc 500-550 msec or with known ventricular conduction abnormalities may proceed with QTc 550-600 msec. QTc = 364    []   [x]   Patient is known or suspected to have a digoxin level greater than 2 ng/ml: No results found for: "DIGOXIN"     []   [x]   Creatinine clearance less than 20 ml/min (calculated using Cockcroft-Gault, actual body weight and serum creatinine): Estimated Creatinine Clearance: 35.1 mL/min (A) (by C-G formula based on SCr of 1.19 mg/dL (H)).     []   [x]  Patient has received drugs known to prolong the QT intervals within the last 48 hours (phenothiazines, tricyclics or tetracyclic antidepressants, erythromycin, H-1 antihistamines, cisapride, fluoroquinolones, azithromycin). Drugs not listed above may have an, as yet, undetected potential to prolong the QT interval, updated information on QT prolonging agents is available at this website:QT prolonging agents or www.crediblemeds.org    []   [x]   Patient received a dose of hydrochlorothiazide (Oretic) alone or in any combination including triamterene (Dyazide, Maxzide) in the last 48 hours.    []   [x]  Patient received a medication known to increase dofetilide plasma concentrations prior to initial dofetilide dose:  Trimethoprim  (Primsol, Proloprim) in the last 36 hours Verapamil (Calan, Verelan) in the last 36 hours or a sustained release dose in the last 72 hours Megestrol (Megace) in the last 5 days  Cimetidine (Tagamet) in the last 6 hours Ketoconazole (Nizoral) in the last 24 hours Itraconazole (Sporanox) in the last 48 hours  Prochlorperazine (Compazine) in the last 36 hours     []   [x]   Patient is known to have a history of torsades de pointes; congenital or acquired long QT syndromes.    []   [x]   Patient has received a Class 1 antiarrhythmic with less than 2 half-lives since last dose. (Disopyramide, Quinidine, Procainamide, Lidocaine, Mexiletine, Flecainide, Propafenone)    []   [x]   Patient has received amiodarone therapy in the past 3 months or amiodarone level is greater than 0.3 ng/ml.    Patient has been appropriately anticoagulated with apixaban 5 mg BID. She reports no missed doses in the past 3 weeks.   Labs:    Component Value Date/Time   K 5.1 09/11/2021 1039   MG 2.2 09/11/2021 1039     Plan: Initiate Tikosyn 250 mcg BID for decreased renal function at 2000 Obtain EKG at 2300 to monitor QTc and dose adjust if necessary  Monitor daily magnesium and potassium levels and replenish as necessary  Monitor renal function and heart rate/rhythm   Potassium: K >/= 4: Appropriate to initiate Tikosyn, no replacement needed   Level 5.1 showing hemolysis present which may impact result accuracy. Will plan to redraw prior to giving first dose of Tikosyn.   Magnesium: Mg >2: Appropriate to initiate Tikosyn, no replacement needed     Thank you for allowing pharmacy to  participate in this patient's care    Jerry Caras, PharmD PGY1 Pharmacy Resident   09/11/2021 2:14 PM

## 2021-09-11 NOTE — Progress Notes (Signed)
Primary Care Physician: Caffie Damme, MD Referring Physician: Dr. Neill Loft Erin [name] is a 76 y.o. female with a h/o CAD,CHF,  afib that has been persistent since March, recently confirmed by monitor. She is tired with this and can often feel palpitations. Dr. Eden Emms wanted her to come in today to discuss Tikosyn. She took amiodarone back in 2018 and stopped due to lung concerns.   She is not overly excited re the hospitalization for afib. We discussed ablation as well. She wants me to discuss with her son before she makes a final decision.   F/u in the afib clinic, 7/24 for Tikosyn admission. She reports that she is now getting over bronchitis, finishing prednisone on Saturday and doxycycline this am. The cough has stopped, just feels weak from the previous coughing. Denies any benadryl or missed eliquis. Ekg shows afib at 120 bpm with qt at  364 ms.  Today, she denies symptoms of palpitations, chest pain, shortness of breath, orthopnea, PND, lower extremity edema, dizziness, presyncope, syncope, or neurologic sequela. The patient is tolerating medications without difficulties and is otherwise without complaint today.   Past Medical History:  Diagnosis Date   A-fib [facility])    Anxiety    Arthritis    "all my back is eat up w/it; knees too" (05/24/2016)   CAD (coronary artery disease)    s/p CABG   Chronic bronchitis (HCC)    Chronic lower back pain    Depression    Dyspnea    "since OR 04/2016" (05/24/2016)   Dysrhythmia    Family history of adverse reaction to anesthesia    "daughter gets PONV" (05/24/2016)   GERD (gastroesophageal reflux disease)    occ   High cholesterol    History of stomach ulcers    Hypertension    Hypothyroidism    Migraine    "usually have one monthly; nothing since 04/25/2016)   Pneumonia ~ 2002   Past Surgical History:  Procedure Laterality Date   ANTERIOR CERVICAL DECOMP/DISCECTOMY FUSION  ~ 2009   BACK SURGERY     BACK SURGERY     JUNE  2018    BLEPHAROPLASTY     CARDIOVERSION N/A 07/04/2016   Procedure: CARDIOVERSION;  Surgeon: Wendall Stade, MD;  Location: [facility] ENDOSCOPY;  Service: Cardiovascular;  Laterality: N/A;   CARDIOVERSION N/A 07/30/2016   Procedure: CARDIOVERSION;  Surgeon: Wendall Stade, MD;  Location: [facility] ENDOSCOPY;  Service: Cardiovascular;  Laterality: N/A;   CARPAL TUNNEL RELEASE Bilateral 80's   CATARACT EXTRACTION, BILATERAL     CORONARY ARTERY BYPASS GRAFT N/A 05/30/2016   Procedure: CORONARY ARTERY BYPASS GRAFTING (CABG), ON PUMP, TIMES FOUR, USING LEFT INTERNAL MAMMARY ARTERY AND ENDOSCOPICALLY HARVESTED BILATERAL GREATER SAPHENOUS VEINS WITH CLIPPING OF LEFT ATRIAL APPENDAGE;  Surgeon: Loreli Slot, MD;  Location: [facility] OR;  Service: Open Heart Surgery;  Laterality: N/A;  LIMA to LAD, SVG to RAMUS INTERMEDIATE, and SVG SEQUENTIALLY to CIRCUMFLEX and PDA   IR FLUORO GUIDED NEEDLE PLC ASPIRATION/INJECTION LOC  07/12/2016   KNEE ARTHROSCOPY Bilateral 2000s   LEFT HEART CATH AND CORONARY ANGIOGRAPHY N/A 05/29/2016   Procedure: Left Heart Cath and Coronary Angiography;  Surgeon: Lennette Bihari, MD;  Location: [facility] INVASIVE CV LAB;  Service: Cardiovascular;  Laterality: N/A;   LUMBAR DISC SURGERY  2006; 07/2005   LUMBAR WOUND DEBRIDEMENT N/A 06/26/2016   Procedure: Incision and Drainage of LUMBAR WOUND;  Surgeon: Donalee Citrin, MD;  Location: [facility] OR;  Service: Neurosurgery;  Laterality: N/A;   POSTERIOR LUMBAR FUSION  04/2016   TEE WITHOUT CARDIOVERSION N/A 07/04/2016   Procedure: TRANSESOPHAGEAL ECHOCARDIOGRAM (TEE);  Surgeon: Josue Hector, MD;  Location: [facility] ENDOSCOPY;  Service: Cardiovascular;  Laterality: N/A;   TEE WITHOUT CARDIOVERSION N/A 07/30/2016   Procedure: TRANSESOPHAGEAL ECHOCARDIOGRAM (TEE);  Surgeon: Josue Hector, MD;  Location: [facility] ENDOSCOPY;  Service: Cardiovascular;  Laterality: N/A;   TONSILLECTOMY     TUBAL LIGATION      Current Outpatient Medications  Medication Sig Dispense Refill   acetaminophen  (TYLENOL) 325 MG tablet Take 650 mg by mouth every 4 (four) hours as needed for mild pain or fever.     apixaban (ELIQUIS) 5 MG TABS tablet Take 1 tablet (5 mg total) by mouth 2 (two) times daily. 180 tablet 1   atorvastatin (LIPITOR) 80 MG tablet Take 1 tablet (80 mg total) by mouth daily. 90 tablet 3   Biotin 5 MG TABS Take 5 mg by mouth every morning.     butalbital-acetaminophen-caffeine (FIORICET) 50-325-40 MG tablet Take 1 tablet by mouth as needed.     cetirizine (ZYRTEC) 10 MG tablet Take 10 mg by mouth daily.     cyclobenzaprine (FLEXERIL) 10 MG tablet Take 1 tablet (10 mg total) by mouth 3 (three) times daily as needed for muscle spasms. (Patient taking differently: Take 10 mg by mouth at bedtime.) 30 tablet 0   cycloSPORINE (RESTASIS) 0.05 % ophthalmic emulsion Place 1 drop into both eyes 2 (two) times daily.     diltiazem (CARDIZEM CD) 300 MG 24 hr capsule Take 1 capsule (300 mg total) by mouth daily. 90 capsule 3   ELDERBERRY PO Take by mouth. Taking one chewable by mouth daily     furosemide (LASIX) 40 MG tablet Take 40 mg by mouth daily.     hydrALAZINE (APRESOLINE) 25 MG tablet Take 25 mg by mouth 2 (two) times daily.     levothyroxine (SYNTHROID) 88 MCG tablet Take 88 mcg by mouth daily.     Magnesium Oxide, Antacid, 400 MG TABS Take 1 tablet by mouth daily.     Multiple Vitamins-Minerals (MULTIVITAMIN ADULT) CHEW Taking one chewable by mouth daily     pantoprazole (PROTONIX) 40 MG tablet Take 1 tablet (40 mg total) by mouth at bedtime. 30 tablet 0   spironolactone (ALDACTONE) 25 MG tablet Take 1 tablet by mouth once daily 90 tablet 3   zolpidem (AMBIEN CR) 6.25 MG CR tablet Take 6.25 mg by mouth at bedtime as needed.     No current facility-administered medications for this encounter.    Allergies  Allergen Reactions   Ace Inhibitors Swelling and Other (See Comments)    ACE stopped after pt seen in ED with facial swelling- allergy testing pending   Ambien [Zolpidem] Other  (See Comments)    confusion   Fentanyl Nausea And Vomiting and Other (See Comments)    PATCH      Morphine And Related Other (See Comments)    Unknown   Codeine Nausea And Vomiting    Social History   Socioeconomic History   Marital status: Divorced    Spouse name: Not on file   Number of children: Not on file   Years of education: Not on file   Highest education level: Not on file  Occupational History   Not on file  Tobacco Use   Smoking status: Never   Smokeless tobacco: Never  Vaping Use   Vaping Use: Never used  Substance and  Sexual Activity   Alcohol use: No   Drug use: No   Sexual activity: Not Currently  Other Topics Concern   Not on file  Social History Narrative   Not on file   Social Determinants of Health   Financial Resource Strain: Not on file  Food Insecurity: Not on file  Transportation Needs: Not on file  Physical Activity: Not on file  Stress: Not on file  Social Connections: Not on file  Intimate Partner Violence: Not on file    Family History  Problem Relation Age of Onset   CAD Sister        hx of CABG   CAD Son        hx of CABG    ROS- All systems are reviewed and negative except as per the HPI above  Physical Exam: There were no vitals filed for this visit.  Wt Readings from Last 3 Encounters:  08/15/21 67.5 kg  07/05/21 69.9 kg  12/30/20 71.2 kg    Labs: Lab Results  Component Value Date   NA 138 08/15/2021   K 4.3 08/15/2021   CL 101 08/15/2021   CO2 24 08/15/2021   GLUCOSE 110 (H) 08/15/2021   BUN 12 08/15/2021   CREATININE 1.14 (H) 08/15/2021   CALCIUM 9.2 08/15/2021   PHOS 2.6 07/24/2016   MG 2.1 08/15/2021   Lab Results  Component Value Date   INR 1.03 09/05/2016   No results found for: "CHOL", "HDL", "LDLCALC", "TRIG"   GEN- The patient is well appearing, alert and oriented x 3 today.   Head- normocephalic, atraumatic Eyes-  Sclera clear, conjunctiva pink Ears- hearing intact Oropharynx-  clear Neck- supple, no JVP Lymph- no cervical lymphadenopathy Lungs- Clear to ausculation bilaterally, normal work of breathing Heart- irregular rate and rhythm, no murmurs, rubs or gallops, PMI not laterally displaced GI- soft, NT, ND, + BS Extremities- no clubbing, cyanosis, or edema MS- no significant deformity or atrophy Skin- no rash or lesion Psych- euthymic mood, full affect Neuro- strength and sensation are intact  EKG-Vent. rate 109 BPM PR interval 118 ms QRS duration 68 ms QT/QTcB 322/433 ms P-R-T axes 85 81 31 Sinus tachycardia with Premature supraventricular complexes Low voltage QRS Septal infarct , age undetermined Abnormal ECG When compared with ECG of 30-Jul-2016 18:38, PREVIOUS ECG IS PRESENT  Echo-  11/2019 1. Left ventricular ejection fraction, by estimation, is 60 to 65%. The  left ventricle has normal function. The left ventricle has no regional  wall motion abnormalities. There is moderate asymmetric left ventricular  hypertrophy of the basal-septal  segment. Left ventricular diastolic parameters are consistent with Grade I  diastolic dysfunction (impaired relaxation).   2. Right ventricular systolic function is normal. The right ventricular  size is normal. There is normal pulmonary artery systolic pressure. The  estimated right ventricular systolic pressure is 22.9 mmHg.   3. The mitral valve is grossly normal. No evidence of mitral valve  regurgitation. No evidence of mitral stenosis.   4. The aortic valve is tricuspid. There is moderate calcification of the  aortic valve. There is moderate thickening of the aortic valve. Aortic  valve regurgitation is not visualized. Mild to moderate aortic valve  sclerosis/calcification is present,  without any evidence of aortic stenosis.   5. The inferior vena cava is normal in size with greater than 50%  respiratory variability, suggesting right atrial pressure of 3 mmHg.   Assessment and Plan:  1.  Afib Persistent since March 2023 We  discussed Tikosyn vrs ablation She wanted me to talk to her son before decision was made I did talk to her son who was out of town and he discussed with his mom when he returned  They decided on Tikosyn admission and she is now here for admit  Treated for bronchitis last week with prednisone and doxycycline  but pt feels she is mostly back to her baseline  Continue diltiazem 300 mg daily  PharmD screened drugs and Zofran was taken off her list and was made aware will not be able to take going forward, otherwise PharmD did not see any other qt prolonging drugs   Crcl cal based on creatinine today of 1.19  is 41.29 so she will qualify  for 250 mcg of Tikosyn, k+/mag are in range for Tikosyn start   2. CHA2DS2VASc  score of at least 4  Continue  eliquis 5 mg bid,states no missed doses for the last 3 weeks   To 6E25 when bed is available   Butch Penny C. Mila Homer Afib [facility] [address] [place], [place] [address] [phone]  States no missed doses for at least the last e weeks

## 2021-09-11 NOTE — Progress Notes (Signed)
Discussed with patient - Restasis 0.05% eye drops are on formulary. Patient would still like to use home supply - aware of home medication policy.   Medication verified at bedside by Pharmacist. Correct medication and expiration. Will be stored at bedside.   Patient is aware that medication must be administered in the presence of nursing.   Sherron Monday, PharmD, BCCCP Clinical Pharmacist  Phone: [phone] 09/11/2021 2:39 PM  Please check AMION for all [facility] phone numbers After 10:00 PM, call Main Pharmacy [phone]

## 2021-09-11 NOTE — Progress Notes (Signed)
Pharmacy: Dofetilide (Tikosyn) - Initial Consult Assessment and Electrolyte Replacement  Pharmacy consulted to assist in monitoring and replacing electrolytes in this 76 y.o. female admitted on 09/11/2021 undergoing dofetilide initiation. First dofetilide dose: 7/24 2000.   Assessment:  Patient Exclusion Criteria: If any screening criteria checked as "Yes", then  patient  should NOT receive dofetilide until criteria item is corrected.  If "Yes" please indicate correction plan.  YES  NO Patient  Exclusion Criteria Correction Plan   []   [x]   Baseline QTc interval is greater than or equal to 440 msec. IF above YES box checked dofetilide contraindicated unless patient has ICD; then may proceed if QTc 500-550 msec or with known ventricular conduction abnormalities may proceed with QTc 550-600 msec. QTc = 364    []   [x]   Patient is known or suspected to have a digoxin level greater than 2 ng/ml: No results found for: "DIGOXIN"     []   [x]   Creatinine clearance less than 20 ml/min (calculated using Cockcroft-Gault, actual body weight and serum creatinine): Estimated Creatinine Clearance: 34.5 mL/min (A) (by C-G formula based on SCr of 1.21 mg/dL (H)).     []   [x]  Patient has received drugs known to prolong the QT intervals within the last 48 hours (phenothiazines, tricyclics or tetracyclic antidepressants, erythromycin, H-1 antihistamines, cisapride, fluoroquinolones, azithromycin). Drugs not listed above may have an, as yet, undetected potential to prolong the QT interval, updated information on QT prolonging agents is available at this website:QT prolonging agents or www.crediblemeds.org    []   [x]   Patient received a dose of hydrochlorothiazide (Oretic) alone or in any combination including triamterene (Dyazide, Maxzide) in the last 48 hours.    []   [x]  Patient received a medication known to increase dofetilide plasma concentrations prior to initial dofetilide dose:  Trimethoprim  (Primsol, Proloprim) in the last 36 hours Verapamil (Calan, Verelan) in the last 36 hours or a sustained release dose in the last 72 hours Megestrol (Megace) in the last 5 days  Cimetidine (Tagamet) in the last 6 hours Ketoconazole (Nizoral) in the last 24 hours Itraconazole (Sporanox) in the last 48 hours  Prochlorperazine (Compazine) in the last 36 hours     []   [x]   Patient is known to have a history of torsades de pointes; congenital or acquired long QT syndromes.    []   [x]   Patient has received a Class 1 antiarrhythmic with less than 2 half-lives since last dose. (Disopyramide, Quinidine, Procainamide, Lidocaine, Mexiletine, Flecainide, Propafenone)    []   [x]   Patient has received amiodarone therapy in the past 3 months or amiodarone level is greater than 0.3 ng/ml.    Patient has been appropriately anticoagulated with apixaban 5 mg BID. She reports no missed doses in the past 3 weeks.   Labs:    Component Value Date/Time   K 4.2 09/11/2021 1443   MG 2.2 09/11/2021 1039     Plan: Initiate Tikosyn 250 mcg BID for decreased renal function at 2000 Obtain EKG at 2300 to monitor QTc and dose adjust if necessary  Monitor daily magnesium and potassium levels and replenish as necessary  Monitor renal function and heart rate/rhythm   Potassium: K >/= 4: Appropriate to initiate Tikosyn, no replacement needed    Magnesium: Mg >2: Appropriate to initiate Tikosyn, no replacement needed     Thank you for allowing pharmacy to participate in this patient's care    , PharmD PGY1 Pharmacy Resident 09/11/2021 4:21 PM

## 2021-09-11 NOTE — H&P (Signed)
Electrophysiology H&P  Note   Primary Care Physician: Caffie Damme, MD Referring Physician: Dr. Neill Loft Erin [name] is a 76 y.o. female with a h/o CAD,CHF,  afib that has been persistent since March, recently confirmed by monitor. She is tired with this and can often feel palpitations. Dr. Eden Emms wanted her to come in today to discuss Tikosyn. She took amiodarone back in 2018 and stopped due to lung concerns.   She is not overly excited re the hospitalization for afib. We discussed ablation as well. She wants me to discuss with her son before she makes a final decision.   F/u in the afib clinic, 7/24 for Tikosyn admission. She reports that she is now getting over bronchitis, finishing prednisone on Saturday and doxycycline this am. The cough has stopped, just feels weak from the previous coughing. Denies any benadryl or missed eliquis. Ekg shows afib at 120 bpm with qt at  364 ms.  Today, she denies symptoms of palpitations, chest pain, shortness of breath, orthopnea, PND, lower extremity edema, dizziness, presyncope, syncope, or neurologic sequela. The patient is tolerating medications without difficulties and is otherwise without complaint today.   Past Medical History:  Diagnosis Date   A-fib [facility])    Anxiety    Arthritis    "all my back is eat up w/it; knees too" (05/24/2016)   CAD (coronary artery disease)    s/p CABG   Chronic bronchitis (HCC)    Chronic lower back pain    Depression    Dyspnea    "since OR 04/2016" (05/24/2016)   Dysrhythmia    Family history of adverse reaction to anesthesia    "daughter gets PONV" (05/24/2016)   GERD (gastroesophageal reflux disease)    occ   High cholesterol    History of stomach ulcers    Hypertension    Hypothyroidism    Migraine    "usually have one monthly; nothing since 04/25/2016)   Pneumonia ~ 2002   Past Surgical History:  Procedure Laterality Date   ANTERIOR CERVICAL DECOMP/DISCECTOMY FUSION  ~ 2009   BACK SURGERY     BACK  SURGERY     JUNE  2018   BLEPHAROPLASTY     CARDIOVERSION N/A 07/04/2016   Procedure: CARDIOVERSION;  Surgeon: Wendall Stade, MD;  Location: [facility] ENDOSCOPY;  Service: Cardiovascular;  Laterality: N/A;   CARDIOVERSION N/A 07/30/2016   Procedure: CARDIOVERSION;  Surgeon: Wendall Stade, MD;  Location: [facility] ENDOSCOPY;  Service: Cardiovascular;  Laterality: N/A;   CARPAL TUNNEL RELEASE Bilateral 80's   CATARACT EXTRACTION, BILATERAL     CORONARY ARTERY BYPASS GRAFT N/A 05/30/2016   Procedure: CORONARY ARTERY BYPASS GRAFTING (CABG), ON PUMP, TIMES FOUR, USING LEFT INTERNAL MAMMARY ARTERY AND ENDOSCOPICALLY HARVESTED BILATERAL GREATER SAPHENOUS VEINS WITH CLIPPING OF LEFT ATRIAL APPENDAGE;  Surgeon: Loreli Slot, MD;  Location: [facility] OR;  Service: Open Heart Surgery;  Laterality: N/A;  LIMA to LAD, SVG to RAMUS INTERMEDIATE, and SVG SEQUENTIALLY to CIRCUMFLEX and PDA   IR FLUORO GUIDED NEEDLE PLC ASPIRATION/INJECTION LOC  07/12/2016   KNEE ARTHROSCOPY Bilateral 2000s   LEFT HEART CATH AND CORONARY ANGIOGRAPHY N/A 05/29/2016   Procedure: Left Heart Cath and Coronary Angiography;  Surgeon: Lennette Bihari, MD;  Location: [facility] INVASIVE CV LAB;  Service: Cardiovascular;  Laterality: N/A;   LUMBAR DISC SURGERY  2006; 07/2005   LUMBAR WOUND DEBRIDEMENT N/A 06/26/2016   Procedure: Incision and Drainage of LUMBAR WOUND;  Surgeon: Donalee Citrin, MD;  Location: [facility]  OR;  Service: Neurosurgery;  Laterality: N/A;   POSTERIOR LUMBAR FUSION  04/2016   TEE WITHOUT CARDIOVERSION N/A 07/04/2016   Procedure: TRANSESOPHAGEAL ECHOCARDIOGRAM (TEE);  Surgeon: Josue Hector, MD;  Location: [facility] ENDOSCOPY;  Service: Cardiovascular;  Laterality: N/A;   TEE WITHOUT CARDIOVERSION N/A 07/30/2016   Procedure: TRANSESOPHAGEAL ECHOCARDIOGRAM (TEE);  Surgeon: Josue Hector, MD;  Location: [facility] ENDOSCOPY;  Service: Cardiovascular;  Laterality: N/A;   TONSILLECTOMY     TUBAL LIGATION      No current facility-administered medications for this  encounter.    Allergies  Allergen Reactions   Ace Inhibitors Swelling and Other (See Comments)    ACE stopped after pt seen in ED with facial swelling- allergy testing pending   Fentanyl Nausea And Vomiting and Other (See Comments)    PATCH      Morphine And Related Other (See Comments)    Unknown   Codeine Nausea And Vomiting    Social History   Socioeconomic History   Marital status: Divorced    Spouse name: Not on file   Number of children: Not on file   Years of education: Not on file   Highest education level: Not on file  Occupational History   Not on file  Tobacco Use   Smoking status: Never   Smokeless tobacco: Never  Vaping Use   Vaping Use: Never used  Substance and Sexual Activity   Alcohol use: No   Drug use: No   Sexual activity: Not Currently  Other Topics Concern   Not on file  Social History Narrative   Not on file   Social Determinants of Health   Financial Resource Strain: Not on file  Food Insecurity: Not on file  Transportation Needs: Not on file  Physical Activity: Not on file  Stress: Not on file  Social Connections: Not on file  Intimate Partner Violence: Not on file    Family History  Problem Relation Age of Onset   CAD Sister        hx of CABG   CAD Son        hx of CABG    ROS- All systems are reviewed and negative except as per the HPI above  Physical Exam: Vitals:   09/11/21 1319  BP: (!) 136/91  Pulse: (!) 108  Resp: 18  Temp: (!) 97.5 F (36.4 C)  TempSrc: Oral  SpO2: 98%    Wt Readings from Last 3 Encounters:  09/11/21 67.2 kg  08/15/21 67.5 kg  07/05/21 69.9 kg    Labs: Lab Results  Component Value Date   NA 141 09/11/2021   K 5.1 09/11/2021   CL 103 09/11/2021   CO2 30 09/11/2021   GLUCOSE 101 (H) 09/11/2021   BUN 22 09/11/2021   CREATININE 1.19 (H) 09/11/2021   CALCIUM 9.4 09/11/2021   PHOS 2.6 07/24/2016   MG 2.2 09/11/2021   Lab Results  Component Value Date   INR 1.03 09/05/2016   No  results found for: "CHOL", "HDL", "LDLCALC", "TRIG"   GEN- The patient is well appearing, alert and oriented x 3 today.   Head- normocephalic, atraumatic Eyes-  Sclera clear, conjunctiva pink Ears- hearing intact Oropharynx- clear Neck- supple, no JVP Lymph- no cervical lymphadenopathy Lungs- Clear to ausculation bilaterally, normal work of breathing Heart- irregular rate and rhythm, no murmurs, rubs or gallops, PMI not laterally displaced GI- soft, NT, ND, + BS Extremities- no clubbing, cyanosis, or edema MS- no significant deformity or atrophy Skin-  no rash or lesion Psych- euthymic mood, full affect Neuro- strength and sensation are intact  EKG-Vent. rate 109 BPM PR interval 118 ms QRS duration 68 ms QT/QTcB 322/433 ms P-R-T axes 85 81 31 Sinus tachycardia with Premature supraventricular complexes Low voltage QRS Septal infarct , age undetermined Abnormal ECG When compared with ECG of 30-Jul-2016 18:38, PREVIOUS ECG IS PRESENT  Echo-  11/2019 1. Left ventricular ejection fraction, by estimation, is 60 to 65%. The  left ventricle has normal function. The left ventricle has no regional  wall motion abnormalities. There is moderate asymmetric left ventricular  hypertrophy of the basal-septal  segment. Left ventricular diastolic parameters are consistent with Grade I  diastolic dysfunction (impaired relaxation).   2. Right ventricular systolic function is normal. The right ventricular  size is normal. There is normal pulmonary artery systolic pressure. The  estimated right ventricular systolic pressure is 22.9 mmHg.   3. The mitral valve is grossly normal. No evidence of mitral valve  regurgitation. No evidence of mitral stenosis.   4. The aortic valve is tricuspid. There is moderate calcification of the  aortic valve. There is moderate thickening of the aortic valve. Aortic  valve regurgitation is not visualized. Mild to moderate aortic valve  sclerosis/calcification is  present,  without any evidence of aortic stenosis.   5. The inferior vena cava is normal in size with greater than 50%  respiratory variability, suggesting right atrial pressure of 3 mmHg.   Assessment and Plan:  1. Afib Persistent since March 2023 We discussed Tikosyn vrs ablation She wanted me to talk to her son before decision was made Spoke to her son who was out of town and he discussed with his mom when he returned  They decided on Tikosyn admission and she is now here for admit  Treated for bronchitis last week with prednisone and doxycycline  but pt feels she is mostly back to her baseline  Continue diltiazem 300 mg daily  PharmD screened drugs and Zofran was taken off her list and was made aware will not be able to take going forward, otherwise PharmD did not see any other qt prolonging drugs   Crcl cal based on creatinine today of 1.19  is 41.29 so she will qualify  for 250 mcg of Tikosyn, k+/mag are in range for Tikosyn start   2. CHA2DS2VASc  score of at least 4  Continue  eliquis 5 mg bid,states no missed doses for the last 3 weeks   Pt presents for tikosyn admission as above. Dr. Lalla Brothers has seen.   Casimiro Needle [address]" [place], PA-C  09/11/2021 1:25 PM

## 2021-09-11 NOTE — Telephone Encounter (Signed)
Pharmacy Patient Advocate Encounter  Insurance verification completed.    The patient is insured through [employer] Part D   The patient is currently admitted and ran test claims for the following: dofetilide (Tikosyn) 250 mcg capsule..  Copays and coinsurance results were relayed to Inpatient clinical team.

## 2021-09-11 NOTE — TOC Benefit Eligibility Note (Signed)
Patient Product/process development scientist completed.    The patient is currently admitted and upon discharge could be taking dofetilide (Tikosyn) 250 mcg capsule.  The current 30 day co-pay is $95.00.   The patient is insured through [employer] Medicare Part D     Roland Earl, CPhT Pharmacy Patient Advocate Specialist [facility] Patient Advocate Team Direct Number: [phone]  Fax: [phone]

## 2021-09-11 NOTE — Addendum Note (Signed)
Encounter addended by: Learta Codding, CMA on: 09/11/2021 1:13 PM  Actions taken: Order Reconciliation Section accessed

## 2021-09-11 NOTE — Addendum Note (Signed)
Encounter addended by: Learta Codding, CMA on: 09/11/2021 1:17 PM  Actions taken: Allergies modified

## 2021-09-12 DIAGNOSIS — I4819 Other persistent atrial fibrillation: Secondary | ICD-10-CM | POA: Diagnosis not present

## 2021-09-12 LAB — BASIC METABOLIC PANEL
Anion gap: 9 (ref 5–15)
BUN: 25 mg/dL — ABNORMAL HIGH (ref 8–23)
CO2: 26 mmol/L (ref 22–32)
Calcium: 9.1 mg/dL (ref 8.9–10.3)
Chloride: 102 mmol/L (ref 98–111)
Creatinine, Ser: 1.08 mg/dL — ABNORMAL HIGH (ref 0.44–1.00)
GFR, Estimated: 53 mL/min — ABNORMAL LOW (ref >60)
Glucose, Bld: 112 mg/dL — ABNORMAL HIGH (ref 70–99)
Potassium: 4 mmol/L (ref 3.5–5.1)
Sodium: 137 mmol/L (ref 135–145)

## 2021-09-12 LAB — MAGNESIUM: Magnesium: 2.3 mg/dL (ref 1.7–2.4)

## 2021-09-12 MED ORDER — POTASSIUM CHLORIDE CRYS ER 20 MEQ PO TBCR
20.0000 meq | EXTENDED_RELEASE_TABLET | Freq: Once | ORAL | Status: AC
  Administered 2021-09-12: 20 meq via ORAL
  Filled 2021-09-12: qty 1

## 2021-09-12 NOTE — Progress Notes (Signed)
Morning EKG reviewed    Shows pt remains in afib (coarse) vs flutter with stable QTc at ~460 ms.  Continue  Tikosyn 250 mcg BID.   Potassium4.0 (07/25 0206) Magnesium  2.3 (07/25 0206) Creatinine, ser  1.08* (07/25 0206)  Pt will be NPO after midnight for DCCV if remains in afib   Graciella Freer, [place]  Pager: [phone]  09/12/2021 11:14 AM

## 2021-09-12 NOTE — Progress Notes (Signed)
Pharmacy: Dofetilide (Tikosyn) - Follow Up Assessment and Electrolyte Replacement  Pharmacy consulted to assist in monitoring and replacing electrolytes in this 76 y.o. female admitted on 09/11/2021 undergoing dofetilide initiation. First dofetilide dose: 7/24 2015.   Labs:    Component Value Date/Time   K 4.0 09/12/2021 0206   MG 2.3 09/12/2021 0206     Plan: Continue with current dose of Tikosyn 250 mcg BID First dose EKG with QTc of 454 with HR of 110 increase from baseline QTc of 364; EP to follow EKG  SCr 1.08 and is trending down Monitor daily magnesium and potassium levels and replenish as necessary  Monitor renal function and heart rate/rhythm    Potassium: K >/= 4: No additional supplementation needed  Magnesium: Mg > 2: No additional supplementation needed    Thank you for allowing pharmacy to participate in this patient's care     Jerry Caras, PharmD PGY1 Pharmacy Resident   09/12/2021 7:37 AM

## 2021-09-12 NOTE — Progress Notes (Signed)
Electrophysiology Rounding Note  Patient Name: Erin [name] Date of Encounter: 09/12/2021  Primary Cardiologist: Charlton Haws, MD  Electrophysiologist: None    Subjective   Pt remains in afib on Tikosyn 250 mcg BID   QTc from EKG last pm shows stable QTc at ~450-460  The patient is doing well today.  At this time, the patient denies chest pain, shortness of breath, or any new concerns.  Inpatient Medications    Scheduled Meds:  apixaban  5 mg Oral BID   atorvastatin  80 mg Oral Daily   cyclobenzaprine  10 mg Oral QHS   cycloSPORINE  1 drop Both Eyes BID   diltiazem  300 mg Oral Daily   dofetilide  250 mcg Oral BID   furosemide  40 mg Oral Daily   hydrALAZINE  25 mg Oral BID   levothyroxine  88 mcg Oral Daily   loratadine  10 mg Oral Daily   magnesium oxide   Oral Daily   pantoprazole  40 mg Oral Daily   sodium chloride flush  3 mL Intravenous Q12H   spironolactone  25 mg Oral Daily   Continuous Infusions:  sodium chloride     PRN Meds: sodium chloride, acetaminophen, albuterol, butalbital-acetaminophen-caffeine, sodium chloride flush, zolpidem   Vital Signs    Vitals:   09/11/21 1647 09/11/21 2006 09/11/21 2355 09/12/21 0355  BP: (!) 134/96 135/80 (!) 127/91 (!) 123/94  Pulse: (!) 101 (!) 107 98 100  Resp: 19 18 18    Temp: 98.3 F (36.8 C) 97.8 F (36.6 C) 98.2 F (36.8 C) 98.2 F (36.8 C)  TempSrc: Oral Oral Oral Oral  SpO2:  100% 96% 97%    Intake/Output Summary (Last 24 hours) at 09/12/2021 09/14/2021 Last data filed at 09/11/2021 2016 Gross per 24 hour  Intake 243 ml  Output --  Net 243 ml   There were no vitals filed for this visit.  Physical Exam    GEN- The patient is well appearing, alert and oriented x 3 today.   Head- normocephalic, atraumatic Eyes-  Sclera clear, conjunctiva pink Ears- hearing intact Oropharynx- clear Neck- supple Lungs- Clear to ausculation bilaterally, normal work of breathing Heart- Irregularly irregular rate and  rhythm, no murmurs, rubs or gallops GI- soft, NT, ND, + BS Extremities- no clubbing, cyanosis, or edema Skin- no rash or lesion Psych- euthymic mood, full affect Neuro- strength and sensation are intact  Labs    CBC No results for input(s): "WBC", "NEUTROABS", "HGB", "HCT", "MCV", "PLT" in the last 72 hours. Basic Metabolic Panel Recent Labs    2017 1039 09/11/21 1443 09/12/21 0206  NA 141 138 137  K 5.1 4.2 4.0  CL 103 102 102  CO2 30 28 26   GLUCOSE 101* 107* 112*  BUN 22 23 25*  CREATININE 1.19* 1.21* 1.08*  CALCIUM 9.4 9.2 9.1  MG 2.2  --  2.3    Potassium  Date/Time Value Ref Range Status  09/12/2021 02:06 AM 4.0 3.5 - 5.1 mmol/L Final   Magnesium  Date/Time Value Ref Range Status  09/12/2021 02:06 AM 2.3 1.7 - 2.4 mg/dL Final    Comment:    Performed at [facility], [address], [place], [address] [place]    Telemetry    AF 90-110s (personally reviewed)  Radiology    No results found.   Patient Profile     Erin [name] is a 76 y.o. female with a past medical history significant for persistent atrial  fibrillation.  They were admitted for tikosyn load.   Assessment & Plan    Persistent atrial fibrillation Pt remains in afib on Tikosyn 250 mcg BID  Continue Eliquis Creatinine, ser  1.08* (07/25 0206) Magnesium  2.3 (07/25 0206) Potassium4.0 (07/25 0206) Supplement K per protocol  If pt does not convert chemically, plan on DCCV tomorrow    For questions or updates, please contact [facility] Please consult [email or web address] for contact info under Cardiology/STEMI.  Signed, Graciella Freer, PA-C  09/12/2021, 8:42 AM

## 2021-09-13 ENCOUNTER — Inpatient Hospital Stay (HOSPITAL_COMMUNITY): Payer: Medicare HMO | Admitting: Anesthesiology

## 2021-09-13 ENCOUNTER — Ambulatory Visit (HOSPITAL_COMMUNITY): Admit: 2021-09-13 | Payer: Medicare HMO | Admitting: Cardiovascular Disease

## 2021-09-13 ENCOUNTER — Encounter (HOSPITAL_COMMUNITY): Payer: Self-pay | Admitting: Cardiology

## 2021-09-13 ENCOUNTER — Encounter (HOSPITAL_COMMUNITY)
Admission: AD | Disposition: A | Discharge status: Home / Self Care | Payer: Self-pay | Source: Ambulatory Visit | Attending: Cardiology

## 2021-09-13 DIAGNOSIS — I110 Hypertensive heart disease with heart failure: Secondary | ICD-10-CM

## 2021-09-13 DIAGNOSIS — I2510 Atherosclerotic heart disease of native coronary artery without angina pectoris: Secondary | ICD-10-CM

## 2021-09-13 DIAGNOSIS — I509 Heart failure, unspecified: Secondary | ICD-10-CM | POA: Diagnosis not present

## 2021-09-13 DIAGNOSIS — I4891 Unspecified atrial fibrillation: Secondary | ICD-10-CM | POA: Diagnosis not present

## 2021-09-13 DIAGNOSIS — I4819 Other persistent atrial fibrillation: Secondary | ICD-10-CM | POA: Diagnosis not present

## 2021-09-13 HISTORY — PX: CARDIOVERSION: SHX1299

## 2021-09-13 LAB — BASIC METABOLIC PANEL
Anion gap: 6 (ref 5–15)
BUN: 24 mg/dL — ABNORMAL HIGH (ref 8–23)
CO2: 28 mmol/L (ref 22–32)
Calcium: 9.8 mg/dL (ref 8.9–10.3)
Chloride: 102 mmol/L (ref 98–111)
Creatinine, Ser: 1.14 mg/dL — ABNORMAL HIGH (ref 0.44–1.00)
GFR, Estimated: 49 mL/min — ABNORMAL LOW (ref >60)
Glucose, Bld: 122 mg/dL — ABNORMAL HIGH (ref 70–99)
Potassium: 4.8 mmol/L (ref 3.5–5.1)
Sodium: 136 mmol/L (ref 135–145)

## 2021-09-13 LAB — MAGNESIUM: Magnesium: 2.3 mg/dL (ref 1.7–2.4)

## 2021-09-13 LAB — PROTIME-INR
INR: 1.2 (ref 0.8–1.2)
Prothrombin Time: 15 seconds (ref 11.4–15.2)

## 2021-09-13 SURGERY — CARDIOVERSION
Anesthesia: General

## 2021-09-13 MED ORDER — LIDOCAINE 2% (20 MG/ML) 5 ML SYRINGE
INTRAMUSCULAR | Status: DC | PRN
  Administered 2021-09-13: 100 mg via INTRAVENOUS

## 2021-09-13 MED ORDER — PROPOFOL 10 MG/ML IV BOLUS
INTRAVENOUS | Status: DC | PRN
  Administered 2021-09-13: 60 mg via INTRAVENOUS

## 2021-09-13 MED ORDER — SODIUM CHLORIDE 0.9 % IV SOLN: INTRAVENOUS | Status: DC

## 2021-09-13 MED ORDER — GUAIFENESIN 100 MG/5ML PO LIQD
10.0000 mL | ORAL | Status: DC | PRN
  Administered 2021-09-13: 10 mL via ORAL
  Filled 2021-09-13: qty 10

## 2021-09-13 NOTE — Anesthesia Procedure Notes (Signed)
Procedure Name: General with mask airway Date/Time: 09/13/2021 9:04 AM  Performed by: Rachel Moulds, CRNAPre-anesthesia Checklist: Patient identified, Emergency Drugs available, Timeout performed, Patient being monitored and Suction available Patient Re-evaluated:Patient Re-evaluated prior to induction Oxygen Delivery Method: Ambu bag Preoxygenation: Pre-oxygenation with 100% oxygen Induction Type: IV induction Ventilation: Mask ventilation without difficulty Placement Confirmation: breath sounds checked- equal and bilateral Dental Injury: Teeth and Oropharynx as per pre-operative assessment

## 2021-09-13 NOTE — Anesthesia Postprocedure Evaluation (Signed)
Anesthesia Post Note  Patient: Erin [name]  Procedure(s) Performed: CARDIOVERSION     Patient location during evaluation: PACU Anesthesia Type: General Level of consciousness: awake and alert Pain management: pain level controlled Vital Signs Assessment: post-procedure vital signs reviewed and stable Respiratory status: spontaneous breathing, nonlabored ventilation, respiratory function stable and patient connected to nasal cannula oxygen Cardiovascular status: blood pressure returned to baseline and stable Postop Assessment: no apparent nausea or vomiting Anesthetic complications: no   No notable events documented.  Last Vitals:  Vitals:   09/13/21 0921 09/13/21 0930  BP: 125/79 122/81  Pulse: 79 74  Resp: 16 14  Temp:    SpO2: 96% 95%    Last Pain:  Vitals:   09/13/21 0930  TempSrc:   PainSc: 0-No pain                 Jyquan Kenley

## 2021-09-13 NOTE — Care Management (Signed)
09-13-21 1648 Case Manager spoke with the patient regarding Tikosyn cost. Patient is agreeable to initial Rx filled via [facility] and Rx refills sent to [facility] in [place]. Patient states she may decide to use [facility] with [facility] cost @ $38.86. No further needs identified at this time.

## 2021-09-13 NOTE — Transfer of Care (Signed)
Immediate Anesthesia Transfer of Care Note  Patient: Erin [name]  Procedure(s) Performed: CARDIOVERSION  Patient Location: Endoscopy Unit  Anesthesia Type:General  Level of Consciousness: awake  Airway & Oxygen Therapy: Patient Spontanous Breathing  Post-op Assessment: Report given to RN and Post -op Vital signs reviewed and stable  Post vital signs: Reviewed and stable  Last Vitals:  Vitals Value Taken Time  BP 124/93   Temp    Pulse 87   Resp    SpO2 94     Last Pain:  Vitals:   09/13/21 0838  TempSrc: Temporal  PainSc: 0-No pain      Patients Stated Pain Goal: 0 (09/12/21 1440)  Complications: No notable events documented.

## 2021-09-13 NOTE — Progress Notes (Signed)
Pharmacy: Dofetilide (Tikosyn) - Follow Up Assessment and Electrolyte Replacement  Pharmacy consulted to assist in monitoring and replacing electrolytes in this 76 y.o. female admitted on 09/11/2021 undergoing dofetilide initiation. Most recent dose 7/26 0817. Patient underwent successful electric cardioversion and is in NSR with stable renal function.   Labs:    Component Value Date/Time   K 4.8 09/13/2021 0212   MG 2.3 09/13/2021 0212     Potassium: K >/= 4: No additional supplementation needed  Magnesium: Mg > 2: No additional supplementation needed    Plan: Continue with current dose of Tikosyn 250 mcg BID EKG s/p cardioversion with QTc of 466 increase from previous QTc of 454; EP to follow EKG  SCr 1.14 with a baseline ~1 Monitor daily magnesium and potassium levels and replenish as necessary  Monitor renal function and heart rate/rhythm     Thank you for allowing pharmacy to participate in this patient's care   Jerry Caras, PharmD PGY1 Pharmacy Resident   09/13/2021 10:06 AM

## 2021-09-13 NOTE — Anesthesia Preprocedure Evaluation (Signed)
Anesthesia Evaluation  Patient identified by MRN, date of birth, ID band Patient awake    Reviewed: Allergy & Precautions, H&P , NPO status , Patient's Chart, lab work & pertinent test results, reviewed documented beta blocker date and time   Airway Mallampati: II  TM Distance: >3 FB Neck ROM: Full    Dental no notable dental hx. (+) Teeth Intact, Dental Advisory Given   Pulmonary neg pulmonary ROS,    Pulmonary exam normal breath sounds clear to auscultation       Cardiovascular hypertension, Pt. on medications and Pt. on home beta blockers + CAD, + CABG and +CHF  + dysrhythmias Atrial Fibrillation  Rhythm:Irregular Rate:Normal  Normal LV function with an EF of 55-60% without focal segmental wall motion abnormality.   Neuro/Psych  Headaches, PSYCHIATRIC DISORDERS Anxiety Depression    GI/Hepatic Neg liver ROS, GERD  Medicated and Controlled,  Endo/Other  Hypothyroidism   Renal/GU negative Renal ROS  negative genitourinary   Musculoskeletal  (+) Arthritis , Osteoarthritis,    Abdominal   Peds  Hematology negative hematology ROS (+) Blood dyscrasia, anemia ,   Anesthesia Other Findings   Reproductive/Obstetrics negative OB ROS                             Anesthesia Physical  Anesthesia Plan  ASA: 3  Anesthesia Plan: General   Post-op Pain Management: Minimal or no pain anticipated   Induction: Intravenous  PONV Risk Score and Plan: 3 and Ondansetron and Dexamethasone  Airway Management Planned: Natural Airway  Additional Equipment:   Intra-op Plan:   Post-operative Plan: Extubation in OR  Informed Consent: I have reviewed the patients History and Physical, chart, labs and discussed the procedure including the risks, benefits and alternatives for the proposed anesthesia with the patient or authorized representative who has indicated his/her understanding and acceptance.      Dental advisory given  Plan Discussed with: CRNA and Anesthesiologist  Anesthesia Plan Comments:         Anesthesia Quick Evaluation

## 2021-09-13 NOTE — Interval H&P Note (Signed)
History and Physical Interval Note:  09/13/2021 8:43 AM  Erin [name]  has presented today for surgery, with the diagnosis of afib.  The various methods of treatment have been discussed with the patient and family. After consideration of risks, benefits and other options for treatment, the patient has consented to  Procedure(s): CARDIOVERSION (N/A) as a surgical intervention.  The patient's history has been reviewed, patient examined, no change in status, stable for surgery.  I have reviewed the patient's chart and labs.  Questions were answered to the patient's satisfaction.     Erin [name]

## 2021-09-13 NOTE — Op Note (Signed)
Procedure: Electrical Cardioversion Indications:  Atrial Fibrillation  Procedure Details:  Consent: Risks of procedure as well as the alternatives and risks of each were explained to the (patient/caregiver).  Consent for procedure obtained.  Time Out: Verified patient identification, verified procedure, site/side was marked, verified correct patient position, special equipment/implants available, medications/allergies/relevent history reviewed, required imaging and test results available.  Performed  Patient placed on cardiac monitor, pulse oximetry, supplemental oxygen as necessary.  Sedation given:  IV propofol, Dr. Tacy Dura Pacer pads placed anterior and posterior chest.  Cardioverted 1 time(s).  Cardioversion with synchronized biphasic 120J shock.  Evaluation: Findings: Post procedure EKG shows: NSR Complications: None Patient did tolerate procedure well.  Time Spent Directly with the Patient:  30 minutes   Erin [name] 09/13/2021, 9:10 AM

## 2021-09-13 NOTE — Progress Notes (Signed)
Electrophysiology Rounding Note  Patient Name: Erin [name] Date of Encounter: 09/13/2021  Primary Cardiologist: Charlton Haws, MD  Electrophysiologist: None    Subjective   Pt remains in afib on Tikosyn 250 mcg BID   QTc from EKG last pm shows stable QTc at ~460  The patient is doing well today.  At this time, the patient denies chest pain, shortness of breath, or any new concerns.  Inpatient Medications    Scheduled Meds:  apixaban  5 mg Oral BID   atorvastatin  80 mg Oral Daily   cyclobenzaprine  10 mg Oral QHS   cycloSPORINE  1 drop Both Eyes BID   diltiazem  300 mg Oral Daily   dofetilide  250 mcg Oral BID   furosemide  40 mg Oral Daily   hydrALAZINE  25 mg Oral BID   levothyroxine  88 mcg Oral Daily   loratadine  10 mg Oral Daily   magnesium oxide   Oral Daily   pantoprazole  40 mg Oral Daily   sodium chloride flush  3 mL Intravenous Q12H   spironolactone  25 mg Oral Daily   Continuous Infusions:  sodium chloride     sodium chloride     PRN Meds: sodium chloride, acetaminophen, albuterol, butalbital-acetaminophen-caffeine, sodium chloride flush, zolpidem   Vital Signs    Vitals:   09/12/21 0355 09/12/21 1332 09/12/21 2020 09/13/21 0536  BP: (!) 123/94 116/66 121/90 140/80  Pulse: 100  85 97  Resp:  16  20  Temp: 98.2 F (36.8 C) (!) 97.3 F (36.3 C) 98.1 F (36.7 C) 97.6 F (36.4 C)  TempSrc: Oral Oral Oral Oral  SpO2: 97% 97% 97% 96%   No intake or output data in the 24 hours ending 09/13/21 0713 There were no vitals filed for this visit.  Physical Exam    GEN- The patient is well appearing, alert and oriented x 3 today.   Head- normocephalic, atraumatic Eyes-  Sclera clear, conjunctiva pink Ears- hearing intact Oropharynx- clear Neck- supple Lungs- Clear to ausculation bilaterally, normal work of breathing Heart- Irregularly irregular rate and rhythm, no murmurs, rubs or gallops GI- soft, NT, ND, + BS Extremities- no clubbing,  cyanosis, or edema Skin- no rash or lesion Psych- euthymic mood, full affect Neuro- strength and sensation are intact  Labs    CBC No results for input(s): "WBC", "NEUTROABS", "HGB", "HCT", "MCV", "PLT" in the last 72 hours. Basic Metabolic Panel Recent Labs    78/29/56 0206 09/13/21 0212  NA 137 136  K 4.0 4.8  CL 102 102  CO2 26 28  GLUCOSE 112* 122*  BUN 25* 24*  CREATININE 1.08* 1.14*  CALCIUM 9.1 9.8  MG 2.3 2.3    Potassium  Date/Time Value Ref Range Status  09/13/2021 02:12 AM 4.8 3.5 - 5.1 mmol/L Final    Comment:    DELTA CHECK NOTED   Magnesium  Date/Time Value Ref Range Status  09/13/2021 02:12 AM 2.3 1.7 - 2.4 mg/dL Final    Comment:    Performed at [facility], [address], [place], [place] [address]    Telemetry    AF 100-120s (personally reviewed)  Radiology    No results found.   Patient Profile     Erin [name] is a 76 y.o. female with a past medical history significant for persistent atrial fibrillation.  They were admitted for tikosyn load.   Assessment & Plan    Persistent atrial fibrillation Pt  remains in afib on Tikosyn 250 mcg BID  Continue Eliquis Creatinine, ser  1.14* (07/26 3244) Magnesium  2.3 (07/26 0102) Potassium4.8 (07/26 7253) No electrolyte supplementation needed  Scheduled for [facility] this am.  Plan for home tomorrow if QTc remains stable in sinus.    For questions or updates, please contact [facility] Please consult [email or web address] for contact info under Cardiology/STEMI.  Signed, Graciella Freer, PA-C  09/13/2021, 7:13 AM

## 2021-09-13 NOTE — Progress Notes (Signed)
Morning EKG reviewed    Shows is in NSR s/p DCC with stable QTc at ~460 ms.  Continue  Tikosyn 250 mcg BID.   Potassium4.8 (07/26 0881) Magnesium  2.3 (07/26 1031) Creatinine, ser  1.14* (07/26 5945)  Plan for home Thursday if QTc remains stable   Luane School  Pager: [phone]  09/13/2021 11:37 AM

## 2021-09-14 ENCOUNTER — Other Ambulatory Visit (HOSPITAL_COMMUNITY): Payer: Self-pay

## 2021-09-14 DIAGNOSIS — I4819 Other persistent atrial fibrillation: Secondary | ICD-10-CM | POA: Diagnosis not present

## 2021-09-14 LAB — BASIC METABOLIC PANEL
Anion gap: 8 (ref 5–15)
BUN: 23 mg/dL (ref 8–23)
CO2: 28 mmol/L (ref 22–32)
Calcium: 9.7 mg/dL (ref 8.9–10.3)
Chloride: 100 mmol/L (ref 98–111)
Creatinine, Ser: 1.23 mg/dL — ABNORMAL HIGH (ref 0.44–1.00)
GFR, Estimated: 45 mL/min — ABNORMAL LOW (ref >60)
Glucose, Bld: 126 mg/dL — ABNORMAL HIGH (ref 70–99)
Potassium: 4.6 mmol/L (ref 3.5–5.1)
Sodium: 136 mmol/L (ref 135–145)

## 2021-09-14 LAB — MAGNESIUM: Magnesium: 2.2 mg/dL (ref 1.7–2.4)

## 2021-09-14 MED ORDER — DOFETILIDE 250 MCG PO CAPS
250.0000 ug | ORAL_CAPSULE | Freq: Two times a day (BID) | ORAL | 6 refills | Status: DC
  Filled 2021-09-14: qty 60, 30d supply, fill #0

## 2021-09-14 NOTE — Discharge Summary (Signed)
ELECTROPHYSIOLOGY PROCEDURE DISCHARGE SUMMARY    Patient ID: Erin [name],  MRN: [id number], DOB/AGE: Mar 30, 1945 76 y.o.  Admit date: 09/11/2021 Discharge date: 09/14/2021  Primary Care Physician: Caffie Damme, MD  Primary Cardiologist: Charlton Haws, MD  Electrophysiologist: None   Primary Discharge Diagnosis:  1.  Persistent atrial fibrillation status post Tikosyn loading this admission  Allergies  Allergen Reactions   Ace Inhibitors Swelling and Other (See Comments)    ACE stopped after pt seen in ED with facial swelling- allergy testing pending   Fentanyl Nausea And Vomiting and Other (See Comments)    PATCH      Morphine And Related Other (See Comments)    Unknown   Codeine Nausea And Vomiting     Procedures This Admission:  1.  Tikosyn loading  2.  Direct current cardioversion on Wednesday September 13, 2021 by Dr Royann Shivers which successfully restored SR.  There were no early apparent complications.   Brief HPI: Erin [name] is a 76 y.o. female with a past medical history as noted above.  They were referred to EP in the outpatient setting for treatment options of atrial fibrillation.  Risks, benefits, and alternatives to Tikosyn were reviewed with the patient who wished to proceed.    Hospital Course:  The patient was admitted and Tikosyn was initiated.  Renal function and electrolytes were followed during the hospitalization.  Their QTc remained stable. On 09/13/2021 they underwent direct current cardioversion which restored sinus rhythm They were monitored on telemetry up to discharge. On the day of discharge, they were examined by Dr. Lalla Brothers  who considered them stable for discharge to home.  Follow-up has been arranged with the Atrial Fibrillation clinic in approximately 1 week and with EP APP  in 4 weeks.   Physical Exam: Vitals:   09/13/21 1400 09/13/21 2015 09/14/21 0508 09/14/21 1026  BP: 123/79 134/87 (!) 129/98 129/83  Pulse: 79  91   Resp: 16  17   Temp:  97.8 F (36.6 C) (!) 97.3 F (36.3 C) 98.2 F (36.8 C)   TempSrc: Oral Oral Oral   SpO2: 95%  97%     GEN- The patient is well appearing, alert and oriented x 3 today.   HEENT: normocephalic, atraumatic; sclera clear, conjunctiva pink; hearing intact; oropharynx clear; neck supple, no JVP Lymph- no cervical lymphadenopathy Lungs- Clear to ausculation bilaterally, normal work of breathing.  No wheezes, rales, rhonchi Heart- Regular rate and rhythm, no murmurs, rubs or gallops, PMI not laterally displaced GI- soft, non-tender, non-distended, bowel sounds present, no hepatosplenomegaly Extremities- no clubbing, cyanosis, or edema; DP/PT/radial pulses 2+ bilaterally MS- no significant deformity or atrophy Skin- warm and dry, no rash or lesion Psych- euthymic mood, full affect Neuro- strength and sensation are intact  Labs:   Lab Results  Component Value Date   WBC 5.5 11/26/2017   HGB 13.9 11/26/2017   HCT 39.8 11/26/2017   MCV 93 11/26/2017   PLT 169 11/26/2017    Recent Labs  Lab 09/14/21 0149  NA 136  K 4.6  CL 100  CO2 28  BUN 23  CREATININE 1.23*  CALCIUM 9.7  GLUCOSE 126*    Discharge Medications:  Allergies as of 09/14/2021       Reactions   Ace Inhibitors Swelling, Other (See Comments)   ACE stopped after pt seen in ED with facial swelling- allergy testing pending   Fentanyl Nausea And Vomiting, Other (See Comments)   PATCH  Morphine And Related Other (See Comments)   Unknown   Codeine Nausea And Vomiting        Medication List     TAKE these medications    acetaminophen 325 MG tablet Commonly known as: TYLENOL Take 650 mg by mouth every 4 (four) hours as needed for mild pain or fever.   albuterol 108 (90 Base) MCG/ACT inhaler Commonly known as: VENTOLIN HFA SMARTSIG:2 Puff(s) Via Inhaler 4 Times Daily PRN   apixaban 5 MG Tabs tablet Commonly known as: Eliquis Take 1 tablet (5 mg total) by mouth 2 (two) times daily.   atorvastatin 80  MG tablet Commonly known as: Lipitor Take 1 tablet (80 mg total) by mouth daily.   Biotin 5 MG Tabs Take 5 mg by mouth every morning.   butalbital-acetaminophen-caffeine 50-325-40 MG tablet Commonly known as: FIORICET Take 1 tablet by mouth as needed.   cetirizine 10 MG tablet Commonly known as: ZYRTEC Take 10 mg by mouth daily.   cyclobenzaprine 10 MG tablet Commonly known as: FLEXERIL Take 1 tablet (10 mg total) by mouth at bedtime.   cycloSPORINE 0.05 % ophthalmic emulsion Commonly known as: RESTASIS Place 1 drop into both eyes 2 (two) times daily.   diltiazem 300 MG 24 hr capsule Commonly known as: Cardizem CD Take 1 capsule (300 mg total) by mouth daily.   dofetilide 250 MCG capsule Commonly known as: TIKOSYN Take 1 capsule (250 mcg total) by mouth 2 (two) times daily.   ELDERBERRY PO Take by mouth. Taking one chewable by mouth daily   furosemide 40 MG tablet Commonly known as: LASIX Take 40 mg by mouth daily.   hydrALAZINE 25 MG tablet Commonly known as: APRESOLINE Take 25 mg by mouth 2 (two) times daily.   levothyroxine 88 MCG tablet Commonly known as: SYNTHROID Take 88 mcg by mouth daily.   Magnesium Oxide (Antacid) 400 MG Tabs Take 1 tablet by mouth daily.   Multivitamin Adult Chew Taking one chewable by mouth daily   pantoprazole 40 MG tablet Commonly known as: PROTONIX Take 1 tablet (40 mg total) by mouth at bedtime.   spironolactone 25 MG tablet Commonly known as: ALDACTONE Take 1 tablet by mouth once daily   zolpidem 6.25 MG CR tablet Commonly known as: AMBIEN CR Take 6.25 mg by mouth at bedtime as needed.        Disposition:    Follow-up Information     Newman Nip, NP Follow up.   Specialties: Nurse Practitioner, Cardiology Why: on 8/3 at 130 pm for post hospital tikosyn check Contact information: [address] [place] [address] [phone]                 Duration of Discharge Encounter: Greater than 30  minutes including physician time.  Dustin Flock, PA-C  09/14/2021 10:52 AM

## 2021-09-14 NOTE — Plan of Care (Signed)
  Problem: Education: Goal: Knowledge of General Education information will improve Description: Including pain rating scale, medication(s)/side effects and non-pharmacologic comfort measures Outcome: Adequate for Discharge   Problem: Activity: Goal: Risk for activity intolerance will decrease Outcome: Adequate for Discharge   Problem: Activity: Goal: Risk for activity intolerance will decrease Outcome: Adequate for Discharge

## 2021-09-14 NOTE — Progress Notes (Signed)
Pharmacy: Dofetilide (Tikosyn) - Follow Up Assessment and Electrolyte Replacement  Pharmacy consulted to assist in monitoring and replacing electrolytes in this 76 y.o. female admitted on 09/11/2021 undergoing dofetilide initiation. First dofetilide dose: 7/24 2000 and most recent dose on 7/27 0800. Underwent successful electric cardioversion with NSR on 7/26 and stable renal function.  Labs:    Component Value Date/Time   K 4.6 09/14/2021 0149   MG 2.2 09/14/2021 0149     Plan:  Potassium: K >/= 4: No additional supplementation needed  Magnesium: Mg > 2: No additional supplementation needed   As patient has required a one time dose of potassium on 7/25 ( ) during tikosyn initiation. Recommend no potassium prescription and continue to monitor outpatient.   Thank you for allowing pharmacy to participate in this patient's care   Arabella Merles, PharmD. [facility] Acute Care PGY-1  09/14/2021 8:24 AM

## 2021-09-14 NOTE — Care Management Important Message (Signed)
Important Message  Patient Details  Name: Erin [name] MRN: [id number] Date of Birth: 1946/01/06   Medicare Important Message Given:  Yes     Renie Ora 09/14/2021, 1:04 PM

## 2021-09-14 NOTE — Progress Notes (Signed)
EKG from yesterday evening 09/13/2021 reviewed    Shows is in NSR s/p DCC at 80 bpm with stable QTc at ~460 ms ms.  Continue  Tikosyn 250 mcg BID.   Potassium4.6 (07/27 0149) Magnesium  2.2 (07/27 0149) Creatinine, ser  1.23* (07/27 0149)  Plan for home this afternoon if QTc remains stable.   Graciella Freer, PA-C  Pager: [phone]  09/14/2021 6:58 AM

## 2021-09-21 ENCOUNTER — Ambulatory Visit (HOSPITAL_COMMUNITY)
Admit: 2021-09-21 | Discharge: 2021-09-21 | Disposition: A | Discharge status: Home / Self Care | Payer: Medicare HMO | Attending: Nurse Practitioner | Admitting: Nurse Practitioner

## 2021-09-21 ENCOUNTER — Encounter (HOSPITAL_COMMUNITY): Payer: Self-pay | Admitting: Nurse Practitioner

## 2021-09-21 VITALS — BP 132/82 | HR 77 | Ht 65.0 in | Wt 148.2 lb

## 2021-09-21 DIAGNOSIS — Z79899 Other long term (current) drug therapy: Secondary | ICD-10-CM | POA: Insufficient documentation

## 2021-09-21 DIAGNOSIS — I4819 Other persistent atrial fibrillation: Secondary | ICD-10-CM | POA: Insufficient documentation

## 2021-09-21 DIAGNOSIS — I2510 Atherosclerotic heart disease of native coronary artery without angina pectoris: Secondary | ICD-10-CM | POA: Diagnosis not present

## 2021-09-21 DIAGNOSIS — I110 Hypertensive heart disease with heart failure: Secondary | ICD-10-CM | POA: Insufficient documentation

## 2021-09-21 DIAGNOSIS — Z951 Presence of aortocoronary bypass graft: Secondary | ICD-10-CM | POA: Diagnosis not present

## 2021-09-21 DIAGNOSIS — R9431 Abnormal electrocardiogram [ECG] [EKG]: Secondary | ICD-10-CM | POA: Insufficient documentation

## 2021-09-21 DIAGNOSIS — I480 Paroxysmal atrial fibrillation: Secondary | ICD-10-CM | POA: Diagnosis present

## 2021-09-21 DIAGNOSIS — D6869 Other thrombophilia: Secondary | ICD-10-CM | POA: Diagnosis not present

## 2021-09-21 DIAGNOSIS — I509 Heart failure, unspecified: Secondary | ICD-10-CM | POA: Diagnosis not present

## 2021-09-21 LAB — BASIC METABOLIC PANEL
Anion gap: 9 (ref 5–15)
BUN: 25 mg/dL — ABNORMAL HIGH (ref 8–23)
CO2: 26 mmol/L (ref 22–32)
Calcium: 9.6 mg/dL (ref 8.9–10.3)
Chloride: 101 mmol/L (ref 98–111)
Creatinine, Ser: 1.21 mg/dL — ABNORMAL HIGH (ref 0.44–1.00)
GFR, Estimated: 46 mL/min — ABNORMAL LOW (ref >60)
Glucose, Bld: 107 mg/dL — ABNORMAL HIGH (ref 70–99)
Potassium: 5.1 mmol/L (ref 3.5–5.1)
Sodium: 136 mmol/L (ref 135–145)

## 2021-09-21 LAB — MAGNESIUM: Magnesium: 2.3 mg/dL (ref 1.7–2.4)

## 2021-09-21 MED ORDER — DOFETILIDE 250 MCG PO CAPS: 250.0000 ug | ORAL_CAPSULE | Freq: Two times a day (BID) | ORAL | 6 refills | Status: DC

## 2021-09-21 NOTE — Progress Notes (Signed)
Primary Care Physician: Glendon Axe, MD Referring Physician: Dr. Rondell Reams Erin [name] is a 75 y.o. female with a h/o CAD,CHF,  afib that has been persistent since March, recently confirmed by monitor. She is tired with this and can often feel palpitations. Dr. Johnsie Cancel wanted her to come in today to discuss Tikosyn. She took amiodarone back in 2018 and stopped due to lung concerns.   She is not overly excited re the hospitalization for afib. We discussed ablation as well. She wants me to discuss with her son before she makes a final decision.   F/u in the afib clinic, 7/24 for [place] admission. She reports that she is now getting over bronchitis, finishing prednisone on Saturday and doxycycline this am. The cough has stopped, just feels weak from the previous coughing. Denies any benadryl or missed eliquis. Ekg shows afib at 120 bpm with qt at  364 ms.  F/u Tikosyn admit, 09/20/21.She successfully started Tikosyn 250 mcg bid and underwent successful cardioversion 09/13/21. In the afib clinic, ekg shows SR with Pac's with stable qt. She has h/o of migraine's and says she has had daily H/A's  since starting Tikosyn. She does feel improved in SR.   Today, she denies symptoms of palpitations, chest pain, shortness of breath, orthopnea, PND, lower extremity edema, dizziness, presyncope, syncope, or neurologic sequela. The patient is tolerating medications without difficulties and is otherwise without complaint today.   Past Medical History:  Diagnosis Date   A-fib [facility])    Anxiety    Arthritis    "all my back is eat up w/it; knees too" (05/24/2016)   CAD (coronary artery disease)    s/p CABG   Chronic bronchitis (HCC)    Chronic lower back pain    Depression    Dyspnea    "since OR 04/2016" (05/24/2016)   Dysrhythmia    Family history of adverse reaction to anesthesia    "daughter gets PONV" (05/24/2016)   GERD (gastroesophageal reflux disease)    occ   High cholesterol    History of stomach  ulcers    Hypertension    Hypothyroidism    Migraine    "usually have one monthly; nothing since 04/25/2016)   Pneumonia ~ 2002   Past Surgical History:  Procedure Laterality Date   ANTERIOR CERVICAL DECOMP/DISCECTOMY FUSION  ~ 2009   BACK SURGERY     BACK SURGERY     JUNE  2018   BLEPHAROPLASTY     CARDIOVERSION N/A 07/04/2016   Procedure: CARDIOVERSION;  Surgeon: Josue Hector, MD;  Location: [place];  Service: Cardiovascular;  Laterality: N/A;   CARDIOVERSION N/A 07/30/2016   Procedure: CARDIOVERSION;  Surgeon: Josue Hector, MD;  Location: [place];  Service: Cardiovascular;  Laterality: N/A;   CARDIOVERSION N/A 09/13/2021   Procedure: CARDIOVERSION;  Surgeon: Sanda Klein, MD;  Location: [place];  Service: Cardiovascular;  Laterality: N/A;   CARPAL TUNNEL RELEASE Bilateral 80's   CATARACT EXTRACTION, BILATERAL     CORONARY ARTERY BYPASS GRAFT N/A 05/30/2016   Procedure: CORONARY ARTERY BYPASS GRAFTING (CABG), ON PUMP, TIMES FOUR, USING LEFT INTERNAL MAMMARY ARTERY AND ENDOSCOPICALLY HARVESTED BILATERAL GREATER SAPHENOUS VEINS WITH CLIPPING OF LEFT ATRIAL APPENDAGE;  Surgeon: Melrose Nakayama, MD;  Location: [place];  Service: Open Heart Surgery;  Laterality: N/A;  LIMA to LAD, SVG to RAMUS INTERMEDIATE, and SVG SEQUENTIALLY to CIRCUMFLEX and PDA   IR FLUORO GUIDED NEEDLE PLC ASPIRATION/INJECTION LOC  07/12/2016   KNEE ARTHROSCOPY Bilateral  2000s   LEFT HEART CATH AND CORONARY ANGIOGRAPHY N/A 05/29/2016   Procedure: Left Heart Cath and Coronary Angiography;  Surgeon: Lennette Bihari, MD;  Location: [facility] INVASIVE CV LAB;  Service: Cardiovascular;  Laterality: N/A;   LUMBAR DISC SURGERY  2006; 07/2005   LUMBAR WOUND DEBRIDEMENT N/A 06/26/2016   Procedure: Incision and Drainage of LUMBAR WOUND;  Surgeon: Donalee Citrin, MD;  Location: [facility] OR;  Service: Neurosurgery;  Laterality: N/A;   POSTERIOR LUMBAR FUSION  04/2016   TEE WITHOUT CARDIOVERSION N/A 07/04/2016   Procedure:  TRANSESOPHAGEAL ECHOCARDIOGRAM (TEE);  Surgeon: Wendall Stade, MD;  Location: [facility] ENDOSCOPY;  Service: Cardiovascular;  Laterality: N/A;   TEE WITHOUT CARDIOVERSION N/A 07/30/2016   Procedure: TRANSESOPHAGEAL ECHOCARDIOGRAM (TEE);  Surgeon: Wendall Stade, MD;  Location: [facility] ENDOSCOPY;  Service: Cardiovascular;  Laterality: N/A;   TONSILLECTOMY     TUBAL LIGATION      Current Outpatient Medications  Medication Sig Dispense Refill   acetaminophen (TYLENOL) 325 MG tablet Take 650 mg by mouth every 4 (four) hours as needed for mild pain or fever.     albuterol (VENTOLIN HFA) 108 (90 Base) MCG/ACT inhaler SMARTSIG:2 Puff(s) Via Inhaler 4 Times Daily PRN     apixaban (ELIQUIS) 5 MG TABS tablet Take 1 tablet (5 mg total) by mouth 2 (two) times daily. 180 tablet 1   atorvastatin (LIPITOR) 80 MG tablet Take 1 tablet (80 mg total) by mouth daily. 90 tablet 3   Biotin 5 MG TABS Take 5 mg by mouth every morning.     butalbital-acetaminophen-caffeine (FIORICET) 50-325-40 MG tablet Take 1 tablet by mouth as needed.     cetirizine (ZYRTEC) 10 MG tablet Take 10 mg by mouth daily.     cyclobenzaprine (FLEXERIL) 10 MG tablet Take 1 tablet (10 mg total) by mouth at bedtime.     cycloSPORINE (RESTASIS) 0.05 % ophthalmic emulsion Place 1 drop into both eyes 2 (two) times daily.     diltiazem (CARDIZEM CD) 300 MG 24 hr capsule Take 1 capsule (300 mg total) by mouth daily. 90 capsule 3   dofetilide (TIKOSYN) 250 MCG capsule Take 1 capsule (250 mcg total) by mouth 2 (two) times daily. 60 capsule 6   ELDERBERRY PO Take by mouth. Taking one chewable by mouth daily     furosemide (LASIX) 40 MG tablet Take 40 mg by mouth daily.     hydrALAZINE (APRESOLINE) 25 MG tablet Take 25 mg by mouth 2 (two) times daily.     levothyroxine (SYNTHROID) 88 MCG tablet Take 88 mcg by mouth daily.     Magnesium Oxide, Antacid, 400 MG TABS Take 1 tablet by mouth daily.     Multiple Vitamins-Minerals (MULTIVITAMIN ADULT) CHEW Taking one  chewable by mouth daily     pantoprazole (PROTONIX) 40 MG tablet Take 1 tablet (40 mg total) by mouth at bedtime. 30 tablet 0   spironolactone (ALDACTONE) 25 MG tablet Take 1 tablet by mouth once daily 90 tablet 3   zolpidem (AMBIEN CR) 6.25 MG CR tablet Take 6.25 mg by mouth at bedtime as needed.     No current facility-administered medications for this encounter.    Allergies  Allergen Reactions   Ace Inhibitors Swelling and Other (See Comments)    ACE stopped after pt seen in ED with facial swelling- allergy testing pending   Fentanyl Nausea And Vomiting and Other (See Comments)    PATCH      Morphine And Related Other (See Comments)  Unknown   Codeine Nausea And Vomiting    Social History   Socioeconomic History   Marital status: Divorced    Spouse name: Not on file   Number of children: Not on file   Years of education: Not on file   Highest education level: Not on file  Occupational History   Not on file  Tobacco Use   Smoking status: Never   Smokeless tobacco: Never  Vaping Use   Vaping Use: Never used  Substance and Sexual Activity   Alcohol use: No   Drug use: No   Sexual activity: Not Currently  Other Topics Concern   Not on file  Social History Narrative   Not on file   Social Determinants of Health   Financial Resource Strain: Not on file  Food Insecurity: Not on file  Transportation Needs: Not on file  Physical Activity: Not on file  Stress: Not on file  Social Connections: Not on file  Intimate Partner Violence: Not on file    Family History  Problem Relation Age of Onset   CAD Sister        hx of CABG   CAD Son        hx of CABG    ROS- All systems are reviewed and negative except as per the HPI above  Physical Exam: There were no vitals filed for this visit.  Wt Readings from Last 3 Encounters:  09/11/21 67.2 kg  08/15/21 67.5 kg  07/05/21 69.9 kg    Labs: Lab Results  Component Value Date   NA 136 09/14/2021   K 4.6  09/14/2021   CL 100 09/14/2021   CO2 28 09/14/2021   GLUCOSE 126 (H) 09/14/2021   BUN 23 09/14/2021   CREATININE 1.23 (H) 09/14/2021   CALCIUM 9.7 09/14/2021   PHOS 2.6 07/24/2016   MG 2.2 09/14/2021   Lab Results  Component Value Date   INR 1.2 09/13/2021   No results found for: "CHOL", "HDL", "LDLCALC", "TRIG"   GEN- The patient is well appearing, alert and oriented x 3 today.   Head- normocephalic, atraumatic Eyes-  Sclera clear, conjunctiva pink Ears- hearing intact Oropharynx- clear Neck- supple, no JVP Lymph- no cervical lymphadenopathy Lungs- Clear to ausculation bilaterally, normal work of breathing Heart- regular rate and rhythm, no murmurs, rubs or gallops, PMI not laterally displaced GI- soft, NT, ND, + BS Extremities- no clubbing, cyanosis, or edema MS- no significant deformity or atrophy Skin- no rash or lesion Psych- euthymic mood, full affect Neuro- strength and sensation are intact  EKG- Vent. rate 77 BPM PR interval 176 ms QRS duration 68 ms QT/QTcB 378/427 ms P-R-T axes 75 47 40 Sinus rhythm with marked sinus arrhythmia Low voltage QRS Septal infarct , age undetermined Abnormal ECG When compared with ECG of 14-Sep-2021 10:15, PREVIOUS ECG IS PRESENT  Echo-  11/2019 1. Left ventricular ejection fraction, by estimation, is 60 to 65%. The  left ventricle has normal function. The left ventricle has no regional  wall motion abnormalities. There is moderate asymmetric left ventricular  hypertrophy of the basal-septal  segment. Left ventricular diastolic parameters are consistent with Grade I  diastolic dysfunction (impaired relaxation).   2. Right ventricular systolic function is normal. The right ventricular  size is normal. There is normal pulmonary artery systolic pressure. The  estimated right ventricular systolic pressure is 22.9 mmHg.   3. The mitral valve is grossly normal. No evidence of mitral valve  regurgitation. No evidence of mitral  stenosis.   4. The aortic valve is tricuspid. There is moderate calcification of the  aortic valve. There is moderate thickening of the aortic valve. Aortic  valve regurgitation is not visualized. Mild to moderate aortic valve  sclerosis/calcification is present,  without any evidence of aortic stenosis.   5. The inferior vena cava is normal in size with greater than 50%  respiratory variability, suggesting right atrial pressure of 3 mmHg.   Assessment and Plan:  1. Afib Persistent since March 2023 She is here s/p one week after tikosyn admit  In SR with PAc's, qt stable  Continue diltiazem 300 mg daily  Continue dofetilide 250 mcg bid, precautions reviewed  H/a's for the last week will hopefully resolve soon, sometimes I do hear of the c/o of H/A for the first 7-10 days with tikosyn , pt with h/o of H/A Bmet/mag  2. CHA2DS2VASc  score of at least 4  Continue  eliquis 5 mg bid   F/u with Tommye Standard, PA, 9/5, we ill see back in 3 months    Butch Penny C. Arista Kettlewell, [facility] [address] [place], [place] [address] [phone]

## 2021-10-22 NOTE — Progress Notes (Unsigned)
Cardiology Office Note Date:  10/24/2021  Patient ID:  Erin [name], Erin [name] 1945-08-26, MRN [id number] PCP:  Glendon Axe, MD  Cardiologist:  Dr. Johnsie Cancel Electrophysiologist: Dr. Quentin Ore    Chief Complaint:  post [name]  History of Present Illness: Erin [name] is a 76 y.o. female with history of CAD (CABG and LAA clip), HTN, Afib, chronic CHF (systolic)  CABG by Dr Roxan Hockey 05/30/16 with LAA clipping.  Her course was complicated by [facility] sepsis and paravertebral abscess post lumbar laminectomy from March os 2018 with revision by Dr Saintclair Halsted 06/26/16.   She was readmitted towards the end of July by Dr. Saintclair Halsted and had  "exploration of fusion removal of hardware L2-S1 with removal of bilateral loose L2 pedicle screws which were sent for culture and removal of rods and all top tightening nuts and cross-link  2018, during her CABG hospitalization, she had recurrent PAF - was placed back on Eliquis and was loaded with amiodarone and has had TEE cardioversion 07/04/16 and there was thrombus in her LAA but clip was intact with no communication to her LA and no other thrombus.  PFT;s 12/17/17 with restriction and DLCO corrects for alveolar volume CT with small vessel disease Elevated right hemi diaphragm and no ILD  Dr Lamonte Sakai saw on 12/20/17 and felt dyspnea from deconditioning and presumed new right hemidiaphragm elevation. Sniff test was not diagnostic of paralysis but had decreased inferior excursion Referred to [facility] for pulmonary rehab   She was loaded on Tikosyn July 2023, discharged 09/14/21. She saw the AFib clinic 09/20/21, c/o daily HAs since on Tikosyn but no AFib and feeling improved from that perspective.  QTc was OK, planned for labs  TODAY Her HAs and Afib have settled significantly She has long hx of Migraine HA's, though the HA with Tikosyn was different, and has improved. She is having much less Afib, initially felt like she was in/out frequently. No CP No near syncope or syncope. No rest  SOB has baseline DOE since her CABG and asks about going to a formal rehab. Back n 2019 she did pulmonary rehab and she would like to try something formal like that again to try and get her back on track and improve her stamina  No bleeding, signs of bleeding Significant b/l forearm bruising lower legs as well   AFib/AAD hx Amiodarone stopped remotely with pulm concerns Tikosyn started July 2023   Past Medical History:  Diagnosis Date   A-fib [facility])    Anxiety    Arthritis    "all my back is eat up w/it; knees too" (05/24/2016)   CAD (coronary artery disease)    s/p CABG   Chronic bronchitis ([place])    Chronic lower back pain    Depression    Dyspnea    "since OR 04/2016" (05/24/2016)   Dysrhythmia    Family history of adverse reaction to anesthesia    "daughter gets PONV" (05/24/2016)   GERD (gastroesophageal reflux disease)    occ   High cholesterol    History of stomach ulcers    Hypertension    Hypothyroidism    Migraine    "usually have one monthly; nothing since 04/25/2016)   Pneumonia ~ 2002    Past Surgical History:  Procedure Laterality Date   ANTERIOR CERVICAL DECOMP/DISCECTOMY FUSION  ~ 2009   [place]  2018   BLEPHAROPLASTY     CARDIOVERSION N/A 07/04/2016  Procedure: CARDIOVERSION;  Surgeon: Josue Hector, MD;  Location: [place];  Service: Cardiovascular;  Laterality: N/A;   CARDIOVERSION N/A 07/30/2016   Procedure: CARDIOVERSION;  Surgeon: Josue Hector, MD;  Location: [place];  Service: Cardiovascular;  Laterality: N/A;   CARDIOVERSION N/A 09/13/2021   Procedure: CARDIOVERSION;  Surgeon: Sanda Klein, MD;  Location: [place];  Service: Cardiovascular;  Laterality: N/A;   CARPAL TUNNEL RELEASE Bilateral 80's   CATARACT EXTRACTION, BILATERAL     CORONARY ARTERY BYPASS GRAFT N/A 05/30/2016   Procedure: CORONARY ARTERY BYPASS GRAFTING (CABG), ON PUMP, TIMES FOUR, USING LEFT INTERNAL MAMMARY ARTERY AND ENDOSCOPICALLY  HARVESTED BILATERAL GREATER SAPHENOUS VEINS WITH CLIPPING OF LEFT ATRIAL APPENDAGE;  Surgeon: Melrose Nakayama, MD;  Location: [place];  Service: Open Heart Surgery;  Laterality: N/A;  LIMA to LAD, SVG to RAMUS INTERMEDIATE, and SVG SEQUENTIALLY to CIRCUMFLEX and PDA   IR FLUORO GUIDED NEEDLE PLC ASPIRATION/INJECTION LOC  07/12/2016   KNEE ARTHROSCOPY Bilateral 2000s   LEFT HEART CATH AND CORONARY ANGIOGRAPHY N/A 05/29/2016   Procedure: Left Heart Cath and Coronary Angiography;  Surgeon: Troy Sine, MD;  Location: [place] CV LAB;  Service: Cardiovascular;  Laterality: N/A;   LUMBAR [place] SURGERY  2006; 07/2005   LUMBAR WOUND DEBRIDEMENT N/A 06/26/2016   Procedure: Incision and Drainage of LUMBAR WOUND;  Surgeon: Kary Kos, MD;  Location: [place];  Service: Neurosurgery;  Laterality: N/A;   POSTERIOR LUMBAR FUSION  04/2016   TEE WITHOUT CARDIOVERSION N/A 07/04/2016   Procedure: TRANSESOPHAGEAL ECHOCARDIOGRAM (TEE);  Surgeon: Josue Hector, MD;  Location: [facility] ENDOSCOPY;  Service: Cardiovascular;  Laterality: N/A;   TEE WITHOUT CARDIOVERSION N/A 07/30/2016   Procedure: TRANSESOPHAGEAL ECHOCARDIOGRAM (TEE);  Surgeon: Josue Hector, MD;  [facility] ENDOSCOPY;  Service: Cardiovascular;  Laterality: N/A;   TONSILLECTOMY     TUBAL LIGATION      Current Outpatient Medications  Medication Sig Dispense Refill   apixaban (ELIQUIS) 5 MG TABS tablet Take 1 tablet (5 mg total) by mouth 2 (two) times daily. 180 tablet 1   atorvastatin (LIPITOR) 80 MG tablet Take 1 tablet (80 mg total) by mouth daily. 90 tablet 3   Biotin 5 MG TABS Take 5 mg by mouth every morning.     butalbital-acetaminophen-caffeine (FIORICET) 50-325-40 MG tablet Take 1 tablet by mouth as needed.     cetirizine (ZYRTEC) 10 MG tablet Take 10 mg by mouth daily.     cyclobenzaprine (FLEXERIL) 10 MG tablet Take 1 tablet (10 mg total) by mouth at bedtime.     cycloSPORINE (RESTASIS) 0.05 % ophthalmic emulsion Place 1 drop into both eyes  2 (two) times daily.     diltiazem (CARDIZEM CD) 300 MG 24 hr capsule Take 1 capsule (300 mg total) by mouth daily. 90 capsule 3   dofetilide (TIKOSYN) 250 MCG capsule Take 1 capsule (250 mcg total) by mouth 2 (two) times daily. 60 capsule 6   ELDERBERRY PO Take by mouth. Taking one chewable by mouth daily     furosemide (LASIX) 40 MG tablet Take 40 mg by mouth daily.     hydrALAZINE (APRESOLINE) 25 MG tablet Take 25 mg by mouth 2 (two) times daily.     levothyroxine (SYNTHROID) 88 MCG tablet Take 88 mcg by mouth daily.     Magnesium Oxide, Antacid, 400 MG TABS Take 1 tablet by mouth daily.     Multiple Vitamins-Minerals (MULTIVITAMIN ADULT) CHEW Taking one chewable by mouth daily  pantoprazole (PROTONIX) 40 MG tablet Take 1 tablet (40 mg total) by mouth at bedtime. 30 tablet 0   predniSONE (STERAPRED UNI-PAK 21 TAB) 10 MG (21) TBPK tablet Take 10 mg by mouth as directed.     spironolactone (ALDACTONE) 25 MG tablet Take 1 tablet by mouth once daily 90 tablet 3   zolpidem (AMBIEN CR) 6.25 MG CR tablet Take 6.25 mg by mouth at bedtime as needed.     No current facility-administered medications for this visit.    Allergies:   Ace inhibitors, Fentanyl, Morphine and related, and Codeine   Social History:  The patient  reports that she has never smoked. She has never used smokeless tobacco. She reports that she does not drink alcohol and does not use drugs.   Family History:  The patient's family history includes CAD in her sister and son.  ROS:  Please see the history of present illness.    All other systems are reviewed and otherwise negative.   PHYSICAL EXAM:  VS:  BP 126/64   Pulse 90   Ht 5\' 5"  (1.651 m)   Wt 147 lb (66.7 kg)   LMP  (LMP Unknown)   SpO2 96%   BMI 24.46 kg/m  BMI: Body mass index is 24.46 kg/m. Well nourished, well developed, in no acute distress HEENT: normocephalic, atraumatic Neck: no JVD, carotid bruits or masses Cardiac:  RRR; extrasystolesno significant  murmurs, no rubs, or gallops Lungs:  CTA b/l, no wheezing, rhonchi or rales Abd: soft, nontender MS: no deformity or atrophy Ext: no edema Skin: warm and dry, no rash Neuro:  No gross deficits appreciated Psych: euthymic mood, full affect   EKG:  Done today and reviewed by myself shows  SR. PACs, QTc   12/17/2019: TTE  1. Left ventricular ejection fraction, by estimation, is 60 to 65%. The  left ventricle has normal function. The left ventricle has no regional  wall motion abnormalities. There is moderate asymmetric left ventricular  hypertrophy of the basal-septal  segment. Left ventricular diastolic parameters are consistent with Grade I  diastolic dysfunction (impaired relaxation).   2. Right ventricular systolic function is normal. The right ventricular  size is normal. There is normal pulmonary artery systolic pressure. The  estimated right ventricular systolic pressure is 22.9 mmHg.   3. The mitral valve is grossly normal. No evidence of mitral valve  regurgitation. No evidence of mitral stenosis.   4. The aortic valve is tricuspid. There is moderate calcification of the  aortic valve. There is moderate thickening of the aortic valve. Aortic  valve regurgitation is not visualized. Mild to moderate aortic valve  sclerosis/calcification is present,  without any evidence of aortic stenosis.   5. The inferior vena cava is normal in size with greater than 50%  respiratory variability, suggesting right atrial pressure of 3 mmHg.   Comparison(s): No significant change from prior study.   Recent Labs: 09/21/2021: BUN 25; Creatinine, Ser 1.21; Magnesium 2.3; Potassium 5.1; Sodium 136  No results found for requested labs within last 365 days.   CrCl cannot be calculated (Patient's most recent lab result is older than the maximum 21 days allowed.).   Wt Readings from Last 3 Encounters:  10/24/21 147 lb (66.7 kg)  09/21/21 148 lb 3.2 oz (67.2 kg)  09/11/21 148 lb 3.2 oz  (67.2 kg)     Other studies reviewed: Additional studies/records reviewed today include: summarized above  ASSESSMENT AND PLAN:  Persistent AFib Has LAA clipped CH2A2DS2Vasc is  5, on Eliquis, appropriately dosed minimal burden by symptoms Tikosyn w/stable QTc Reviewed meds list, not on her list but she reports taking an OTC Klerx (for bowel health), I have sent a message to pharmacy team to check on it tikosyn teaching re-enforced today Labs today  CAD No anginal symptoms C/w Dr. Eden Emms    Chronic CHF (systolic) Recovered LVEF No exam findings of volume OL Chronic unchanged DOE, will refer her to cardiac rehab, not sure if she will qualify.  I have encouraged her to start to walk for exercise to her ability. Offered to refer her back to pulmonary but se declined.  Disposition: F/u with EP in 64mo, sooner if needed  Current medicines are reviewed at length with the patient today.  The patient did not have any concerns regarding medicines.  Norma Fredrickson, PA-C 10/24/2021 1:52 PM     [facility] [address] [place] [address] [phone] (office)  [phone] (fax)

## 2021-10-24 ENCOUNTER — Ambulatory Visit: Payer: Medicare HMO | Attending: Physician Assistant | Admitting: Physician Assistant

## 2021-10-24 ENCOUNTER — Encounter: Payer: Self-pay | Admitting: Physician Assistant

## 2021-10-24 VITALS — BP 126/64 | HR 90 | Ht 65.0 in | Wt 147.0 lb

## 2021-10-24 DIAGNOSIS — I4819 Other persistent atrial fibrillation: Secondary | ICD-10-CM | POA: Diagnosis not present

## 2021-10-24 DIAGNOSIS — Z5181 Encounter for therapeutic drug level monitoring: Secondary | ICD-10-CM | POA: Diagnosis not present

## 2021-10-24 DIAGNOSIS — I2510 Atherosclerotic heart disease of native coronary artery without angina pectoris: Secondary | ICD-10-CM

## 2021-10-24 DIAGNOSIS — Z79899 Other long term (current) drug therapy: Secondary | ICD-10-CM

## 2021-10-24 DIAGNOSIS — I5022 Chronic systolic (congestive) heart failure: Secondary | ICD-10-CM

## 2021-10-24 NOTE — Patient Instructions (Addendum)
Medication Instructions:   Your physician recommends that you continue on your current medications as directed. Please refer to the Current Medication list given to you today.  *If you need a refill on your cardiac medications before your next appointment, please call your pharmacy*   Lab Work: BMET AND MAG TODAY   If you have labs (blood work) drawn today and your tests are completely normal, you will receive your results only by: MyChart Message (if you have MyChart) OR A paper copy in the mail If you have any lab test that is abnormal or we need to change your treatment, we will call you to review the results.   Testing/Procedures: NONE ORDERED  TODAY   Follow-Up: At [facility] you and your health needs are our priority.  As part of our continuing mission to provide you with exceptional heart care, we have created designated Provider Care Teams.  These Care Teams include your primary Cardiologist (physician) and Advanced Practice Providers (APPs -  Physician Assistants and Nurse Practitioners) who all work together to provide you with the care you need, when you need it.  We recommend signing up for the patient portal called "MyChart".  Sign up information is provided on this After Visit Summary.  MyChart is used to connect with patients for Virtual Visits (Telemedicine).  Patients are able to view lab/test results, encounter notes, upcoming appointments, etc.  Non-urgent messages can be sent to your provider as well.   To learn more about what you can do with MyChart, go to [email or web address].    Your next appointment:    4 month(s)  The format for your next appointment:   In Person  Provider:   You may see Dr.Cameron Lalla Brothers or one of the following Advanced Practice Providers on your designated Care Team:   Francis Dowse, PA-C   You have been referred to Cardiac Rehab and someone will contact you back form this department.   Other  Instructions:     Important Information About Sugar

## 2021-10-25 LAB — MAGNESIUM: Magnesium: 2.2 mg/dL (ref 1.6–2.3)

## 2021-10-25 LAB — BASIC METABOLIC PANEL
BUN/Creatinine Ratio: 9 — ABNORMAL LOW (ref 12–28)
BUN: 9 mg/dL (ref 8–27)
CO2: 24 mmol/L (ref 20–29)
Calcium: 9.7 mg/dL (ref 8.7–10.3)
Chloride: 102 mmol/L (ref 96–106)
Creatinine, Ser: 0.98 mg/dL (ref 0.57–1.00)
Glucose: 94 mg/dL (ref 70–99)
Potassium: 4.2 mmol/L (ref 3.5–5.2)
Sodium: 141 mmol/L (ref 134–144)
eGFR: 59 mL/min/{1.73_m2} — ABNORMAL LOW (ref >59)

## 2021-10-26 ENCOUNTER — Telehealth: Payer: Self-pay | Admitting: *Deleted

## 2021-10-26 NOTE — Telephone Encounter (Signed)
Lvm to call back about resutls and inquire about OTC medications

## 2021-10-26 NOTE — Telephone Encounter (Signed)
-----   Message from Sheilah Pigeon, [place] sent at 10/25/2021  6:42 PM EDT ----- Labs look good.  I was unable to find the OTC medication she was taking for her bowels.  Can you please get the name of it again to make sure no issues with the Tikosyn.   Thanks

## 2021-10-27 ENCOUNTER — Telehealth: Payer: Self-pay | Admitting: *Deleted

## 2021-10-27 NOTE — Telephone Encounter (Signed)
Spoke with patient and aware of not  able to qualify for cardiac rehab but Pulmonary rehab  Per Luster Landsberg if able  to walk for exercise as she is able and slowly increase pace and or duration to her comfort/ability. Patient stated she still having shortness of breath unable to walk with a pace  foe exercise and agrees she need pulmonary. Patient will contact primary for referral.

## 2021-11-08 ENCOUNTER — Telehealth: Payer: Self-pay | Admitting: Cardiovascular Disease

## 2021-11-08 NOTE — Telephone Encounter (Signed)
Left message for [facility] with [facility].

## 2021-11-08 NOTE — Telephone Encounter (Signed)
Caller stated patient is signing up for Cardiac Rehab and would need a referral from Dr. Johnsie Cancel.  Fax# [phone].

## 2021-11-09 NOTE — Telephone Encounter (Signed)
Received fax. Dr. Johnsie Cancel signed referral and faxed back to number provided.

## 2021-12-27 ENCOUNTER — Telehealth: Payer: Self-pay | Admitting: Cardiology

## 2021-12-27 NOTE — Telephone Encounter (Signed)
Patient states that she has experienced dizziness "off and on" since she's started Tikosyn in July. She condones that the dizziness occurs all throughout the day and always in conjunction with position changes. She says that sometimes at night it's so bad that she feels like "I'm just going to pass out." Advised her that it could be orthostatic hypotension and not related to her A-fib and she states that she never felt this prior to being placed on Tikosyn. She says that "it just feels awful and I don't want to feel like this." Advised her that I would send this message to the A-fib clinic and ask someone to reach out to her.

## 2021-12-27 NOTE — Telephone Encounter (Signed)
Pt c/o medication issue:  1. Name of Medication: dofetilide (TIKOSYN) 250 MCG capsule   2. How are you currently taking this medication (dosage and times per day)? Take 1 capsule (250 mcg total) by mouth 2 (two) times daily. - Oral   3. Are you having a reaction (difficulty breathing--STAT)?   4. What is your medication issue? Pt states this medication is making her dizzy and would like to speak to someone about it

## 2021-12-28 NOTE — Telephone Encounter (Signed)
Per Rudi Coco NP will bring in for assessment.

## 2022-01-01 ENCOUNTER — Telehealth (HOSPITAL_BASED_OUTPATIENT_CLINIC_OR_DEPARTMENT_OTHER): Payer: Self-pay

## 2022-01-01 ENCOUNTER — Other Ambulatory Visit (HOSPITAL_BASED_OUTPATIENT_CLINIC_OR_DEPARTMENT_OTHER): Payer: Self-pay | Admitting: Neurosurgery

## 2022-01-01 DIAGNOSIS — R42 Dizziness and giddiness: Secondary | ICD-10-CM

## 2022-01-01 DIAGNOSIS — M546 Pain in thoracic spine: Secondary | ICD-10-CM

## 2022-01-04 ENCOUNTER — Ambulatory Visit (HOSPITAL_BASED_OUTPATIENT_CLINIC_OR_DEPARTMENT_OTHER)
Admission: RE | Admit: 2022-01-04 | Discharge: 2022-01-04 | Disposition: A | Discharge status: Home / Self Care | Payer: Medicare HMO | Source: Ambulatory Visit | Attending: Neurosurgery | Admitting: Neurosurgery

## 2022-01-04 DIAGNOSIS — M546 Pain in thoracic spine: Secondary | ICD-10-CM | POA: Insufficient documentation

## 2022-01-04 DIAGNOSIS — R42 Dizziness and giddiness: Secondary | ICD-10-CM | POA: Insufficient documentation

## 2022-01-04 MED ORDER — IOHEXOL 350 MG/ML SOLN
75.0000 mL | Freq: Once | INTRAVENOUS | Status: AC | PRN
  Administered 2022-01-04: 75 mL via INTRAVENOUS

## 2022-01-09 ENCOUNTER — Encounter (HOSPITAL_COMMUNITY): Payer: Self-pay | Admitting: Nurse Practitioner

## 2022-01-09 ENCOUNTER — Inpatient Hospital Stay (HOSPITAL_COMMUNITY)
Admission: RE | Admit: 2022-01-09 | Discharge: 2022-01-09 | Disposition: A | Discharge status: Home / Self Care | Payer: Medicare HMO | Source: Ambulatory Visit | Attending: Nurse Practitioner | Admitting: Nurse Practitioner

## 2022-01-09 ENCOUNTER — Ambulatory Visit (HOSPITAL_COMMUNITY)
Admission: RE | Admit: 2022-01-09 | Discharge: 2022-01-09 | Disposition: A | Discharge status: Home / Self Care | Payer: Medicare HMO | Source: Ambulatory Visit | Attending: Nurse Practitioner | Admitting: Nurse Practitioner

## 2022-01-09 VITALS — BP 134/76 | HR 83 | Ht 65.0 in | Wt 148.2 lb

## 2022-01-09 DIAGNOSIS — R42 Dizziness and giddiness: Secondary | ICD-10-CM | POA: Insufficient documentation

## 2022-01-09 DIAGNOSIS — I110 Hypertensive heart disease with heart failure: Secondary | ICD-10-CM | POA: Insufficient documentation

## 2022-01-09 DIAGNOSIS — Z7901 Long term (current) use of anticoagulants: Secondary | ICD-10-CM | POA: Insufficient documentation

## 2022-01-09 DIAGNOSIS — D6869 Other thrombophilia: Secondary | ICD-10-CM

## 2022-01-09 DIAGNOSIS — I4819 Other persistent atrial fibrillation: Secondary | ICD-10-CM | POA: Insufficient documentation

## 2022-01-09 DIAGNOSIS — I501 Left ventricular failure: Secondary | ICD-10-CM | POA: Diagnosis not present

## 2022-01-09 DIAGNOSIS — I2510 Atherosclerotic heart disease of native coronary artery without angina pectoris: Secondary | ICD-10-CM | POA: Diagnosis not present

## 2022-01-09 DIAGNOSIS — I4891 Unspecified atrial fibrillation: Secondary | ICD-10-CM | POA: Diagnosis not present

## 2022-01-09 LAB — BASIC METABOLIC PANEL
Anion gap: 9 (ref 5–15)
BUN: 11 mg/dL (ref 8–23)
CO2: 26 mmol/L (ref 22–32)
Calcium: 9.7 mg/dL (ref 8.9–10.3)
Chloride: 103 mmol/L (ref 98–111)
Creatinine, Ser: 1 mg/dL (ref 0.44–1.00)
GFR, Estimated: 58 mL/min — ABNORMAL LOW (ref >60)
Glucose, Bld: 93 mg/dL (ref 70–99)
Potassium: 4.4 mmol/L (ref 3.5–5.1)
Sodium: 138 mmol/L (ref 135–145)

## 2022-01-09 LAB — MAGNESIUM: Magnesium: 1.9 mg/dL (ref 1.7–2.4)

## 2022-01-09 NOTE — Progress Notes (Signed)
Primary Care Physician: Caffie Damme, MD Referring Physician: Dr. Neill Loft Erin [name] is a 76 y.o. female with a h/o CAD,CHF,  afib that has been persistent since March, recently confirmed by monitor. She is tired with this and can often feel palpitations. Dr. Eden Emms wanted her to come in today to discuss Tikosyn. She took amiodarone back in 2018 and stopped due to lung concerns.   She is not overly excited re the hospitalization for afib. We discussed ablation as well. She wants me to discuss with her son before she makes a final decision.   F/u in the afib clinic, 7/24 for Tikosyn admission. She reports that she is now getting over bronchitis, finishing prednisone on Saturday and doxycycline this am. The cough has stopped, just feels weak from the previous coughing. Denies any benadryl or missed eliquis. Ekg shows afib at 120 bpm with qt at  364 ms.  F/u Tikosyn admit, 09/20/21.She successfully started Tikosyn 250 mcg bid and underwent successful cardioversion 09/13/21. In the afib clinic, ekg shows SR with Pac's with stable qt. She has h/o of migraine's and says she has had daily H/A's  since starting Tikosyn. She does feel improved in SR.   F/u in afib clinic 01/09/22 to discuss dizziness. She states that soon after starting Tikosyn, she started noticing dizziness. She describes this with position change and when she lies down in bed and turing over will feel the room spin. Sometimes this will make her nauseated. She states ten years ago when she felt this way, she had a pinched nerve in her neck and had to have surgery. She saw Dr. Wynetta Emery for this dizziness and is pending getting results next week. She has not seen her PCP for this. Explained to pt that I don't see dizziness to this degree caused by tikosyn. I could stop Tikosyn but if dizziness did not improve and she went back into afib, then she would need to be readmitted for tikosyn or undergo an ablation. Had to stop amiodarone in the past  for lung issues.  Pt wants to hold off for now until results are in from  Dr. Wynetta Emery and she talks to her PCP. Will place a one week Zio patch to assess for arrhythmia. She sees Dr. Lalla Brothers in f/u in January. She  has not been aware of any afib. Sitting in the chair in the clinic today, she is not dizzy.  Today, she denies symptoms of palpitations, chest pain, shortness of breath, orthopnea, PND, lower extremity edema, dizziness, presyncope, syncope, or neurologic sequela. The patient is tolerating medications without difficulties and is otherwise without complaint today.   Past Medical History:  Diagnosis Date   A-fib [facility])    Anxiety    Arthritis    "all my back is eat up w/it; knees too" (05/24/2016)   CAD (coronary artery disease)    s/p CABG   Chronic bronchitis (HCC)    Chronic lower back pain    Depression    Dyspnea    "since OR 04/2016" (05/24/2016)   Dysrhythmia    Family history of adverse reaction to anesthesia    "daughter gets PONV" (05/24/2016)   GERD (gastroesophageal reflux disease)    occ   High cholesterol    History of stomach ulcers    Hypertension    Hypothyroidism    Migraine    "usually have one monthly; nothing since 04/25/2016)   Pneumonia ~ 2002   Past Surgical History:  Procedure Laterality  Date   ANTERIOR CERVICAL DECOMP/DISCECTOMY FUSION  ~ 2009   BACK SURGERY     BACK SURGERY     JUNE  2018   BLEPHAROPLASTY     CARDIOVERSION N/A 07/04/2016   Procedure: CARDIOVERSION;  Surgeon: Wendall Stade, MD;  Location: [facility] ENDOSCOPY;  Service: Cardiovascular;  Laterality: N/A;   CARDIOVERSION N/A 07/30/2016   Procedure: CARDIOVERSION;  Surgeon: Wendall Stade, MD;  Location: [facility] ENDOSCOPY;  Service: Cardiovascular;  Laterality: N/A;   CARDIOVERSION N/A 09/13/2021   Procedure: CARDIOVERSION;  Surgeon: Thurmon Fair, MD;  Location: [facility] ENDOSCOPY;  Service: Cardiovascular;  Laterality: N/A;   CARPAL TUNNEL RELEASE Bilateral 80's   CATARACT EXTRACTION, BILATERAL      CORONARY ARTERY BYPASS GRAFT N/A 05/30/2016   Procedure: CORONARY ARTERY BYPASS GRAFTING (CABG), ON PUMP, TIMES FOUR, USING LEFT INTERNAL MAMMARY ARTERY AND ENDOSCOPICALLY HARVESTED BILATERAL GREATER SAPHENOUS VEINS WITH CLIPPING OF LEFT ATRIAL APPENDAGE;  Surgeon: Loreli Slot, MD;  Location: [facility] OR;  Service: Open Heart Surgery;  Laterality: N/A;  LIMA to LAD, SVG to RAMUS INTERMEDIATE, and SVG SEQUENTIALLY to CIRCUMFLEX and PDA   IR FLUORO GUIDED NEEDLE PLC ASPIRATION/INJECTION LOC  07/12/2016   KNEE ARTHROSCOPY Bilateral 2000s   LEFT HEART CATH AND CORONARY ANGIOGRAPHY N/A 05/29/2016   Procedure: Left Heart Cath and Coronary Angiography;  Surgeon: Lennette Bihari, MD;  Location: [facility] INVASIVE CV LAB;  Service: Cardiovascular;  Laterality: N/A;   LUMBAR DISC SURGERY  2006; 07/2005   LUMBAR WOUND DEBRIDEMENT N/A 06/26/2016   Procedure: Incision and Drainage of LUMBAR WOUND;  Surgeon: Donalee Citrin, MD;  Location: [facility] OR;  Service: Neurosurgery;  Laterality: N/A;   POSTERIOR LUMBAR FUSION  04/2016   TEE WITHOUT CARDIOVERSION N/A 07/04/2016   Procedure: TRANSESOPHAGEAL ECHOCARDIOGRAM (TEE);  Surgeon: Wendall Stade, MD;  Location: [facility] ENDOSCOPY;  Service: Cardiovascular;  Laterality: N/A;   TEE WITHOUT CARDIOVERSION N/A 07/30/2016   Procedure: TRANSESOPHAGEAL ECHOCARDIOGRAM (TEE);  Surgeon: Wendall Stade, MD;  Location: [facility] ENDOSCOPY;  Service: Cardiovascular;  Laterality: N/A;   TONSILLECTOMY     TUBAL LIGATION      Current Outpatient Medications  Medication Sig Dispense Refill   apixaban (ELIQUIS) 5 MG TABS tablet Take 1 tablet (5 mg total) by mouth 2 (two) times daily. 180 tablet 1   atorvastatin (LIPITOR) 80 MG tablet Take 1 tablet (80 mg total) by mouth daily. 90 tablet 3   Biotin 5 MG TABS Take 5 mg by mouth every morning.     butalbital-acetaminophen-caffeine (FIORICET) 50-325-40 MG tablet Take 1 tablet by mouth as needed.     cetirizine (ZYRTEC) 10 MG tablet Take 10 mg by mouth daily.      cyclobenzaprine (FLEXERIL) 10 MG tablet Take 1 tablet (10 mg total) by mouth at bedtime.     cycloSPORINE (RESTASIS) 0.05 % ophthalmic emulsion Place 1 drop into both eyes 2 (two) times daily.     diltiazem (CARDIZEM CD) 300 MG 24 hr capsule Take 1 capsule (300 mg total) by mouth daily. 90 capsule 3   dofetilide (TIKOSYN) 250 MCG capsule Take 1 capsule (250 mcg total) by mouth 2 (two) times daily. 60 capsule 6   ELDERBERRY PO Take by mouth. Taking one chewable by mouth daily     fluticasone (FLONASE) 50 MCG/ACT nasal spray Place 2 sprays into both nostrils daily.     furosemide (LASIX) 40 MG tablet Take 40 mg by mouth daily.     hydrALAZINE (APRESOLINE) 25 MG tablet Take 25  mg by mouth 2 (two) times daily.     levothyroxine (SYNTHROID) 88 MCG tablet Take 88 mcg by mouth daily.     Magnesium Oxide, Antacid, 400 MG TABS Take 1 tablet by mouth daily.     Multiple Vitamins-Minerals (MULTIVITAMIN ADULT) CHEW Taking one chewable by mouth daily     pantoprazole (PROTONIX) 40 MG tablet Take 1 tablet (40 mg total) by mouth at bedtime. 30 tablet 0   spironolactone (ALDACTONE) 25 MG tablet Take 1 tablet by mouth once daily 90 tablet 3   zolpidem (AMBIEN CR) 6.25 MG CR tablet Take 6.25 mg by mouth at bedtime as needed.     No current facility-administered medications for this encounter.    Allergies  Allergen Reactions   Ace Inhibitors Swelling and Other (See Comments)    ACE stopped after pt seen in ED with facial swelling- allergy testing pending   Fentanyl Nausea And Vomiting and Other (See Comments)    PATCH      Morphine And Related Other (See Comments)    Unknown   Codeine Nausea And Vomiting    Social History   Socioeconomic History   Marital status: Divorced    Spouse name: Not on file   Number of children: Not on file   Years of education: Not on file   Highest education level: Not on file  Occupational History   Not on file  Tobacco Use   Smoking status: Never   Smokeless  tobacco: Never   Tobacco comments:    Never smoke 09/21/21  Vaping Use   Vaping Use: Never used  Substance and Sexual Activity   Alcohol use: No   Drug use: No   Sexual activity: Not Currently  Other Topics Concern   Not on file  Social History Narrative   Not on file   Social Determinants of Health   Financial Resource Strain: Not on file  Food Insecurity: Not on file  Transportation Needs: Not on file  Physical Activity: Not on file  Stress: Not on file  Social Connections: Not on file  Intimate Partner Violence: Not on file    Family History  Problem Relation Age of Onset   CAD Sister        hx of CABG   CAD Son        hx of CABG    ROS- All systems are reviewed and negative except as per the HPI above  Physical Exam: Vitals:   01/09/22 0941  Pulse: 83  Weight: 67.2 kg  Height: 5\' 5"  (1.651 m)    Wt Readings from Last 3 Encounters:  01/09/22 67.2 kg  10/24/21 66.7 kg  09/21/21 67.2 kg    Labs: Lab Results  Component Value Date   NA 141 10/24/2021   K 4.2 10/24/2021   CL 102 10/24/2021   CO2 24 10/24/2021   GLUCOSE 94 10/24/2021   BUN 9 10/24/2021   CREATININE 0.98 10/24/2021   CALCIUM 9.7 10/24/2021   PHOS 2.6 07/24/2016   MG 2.2 10/24/2021   Lab Results  Component Value Date   INR 1.2 09/13/2021   No results found for: "CHOL", "HDL", "LDLCALC", "TRIG"   GEN- The patient is well appearing, alert and oriented x 3 today.   Head- normocephalic, atraumatic Eyes-  Sclera clear, conjunctiva pink Ears- hearing intact Oropharynx- clear Neck- supple, no JVP, no carotid bruits Lymph- no cervical lymphadenopathy Lungs- Clear to ausculation bilaterally, normal work of breathing Heart- regular rate and rhythm, no  murmurs, rubs or gallops, PMI not laterally displaced GI- soft, NT, ND, + BS Extremities- no clubbing, cyanosis, or edema MS- no significant deformity or atrophy Skin- no rash or lesion Psych- euthymic mood, full affect Neuro-  strength and sensation are intact  EKG-  Vent. rate 83 BPM PR interval 192 ms QRS duration 62 ms QT/QTcB 352/413 ms P-R-T axes 65 63 39 Normal sinus rhythm Low voltage QRS Borderline ECG When compared with ECG of 21-Sep-2021 13:31, PREVIOUS ECG IS PRESENT  Echo-  11/2019 1. Left ventricular ejection fraction, by estimation, is 60 to 65%. The  left ventricle has normal function. The left ventricle has no regional  wall motion abnormalities. There is moderate asymmetric left ventricular  hypertrophy of the basal-septal  segment. Left ventricular diastolic parameters are consistent with Grade I  diastolic dysfunction (impaired relaxation).   2. Right ventricular systolic function is normal. The right ventricular  size is normal. There is normal pulmonary artery systolic pressure. The  estimated right ventricular systolic pressure is 22.9 mmHg.   3. The mitral valve is grossly normal. No evidence of mitral valve  regurgitation. No evidence of mitral stenosis.   4. The aortic valve is tricuspid. There is moderate calcification of the  aortic valve. There is moderate thickening of the aortic valve. Aortic  valve regurgitation is not visualized. Mild to moderate aortic valve  sclerosis/calcification is present,  without any evidence of aortic stenosis.   5. The inferior vena cava is normal in size with greater than 50%  respiratory variability, suggesting right atrial pressure of 3 mmHg.   Assessment and Plan:  1. Afib Persistent since March 2023 Admitted for Tikosyn August of this year  In SR qt stable, no afib noted  Continue diltiazem 300 mg daily  Continue dofetilide 250 mcg bid  H/a's for the first week of Tikosyn now resolved Bmet/mag  2. CHA2DS2VASc  score of at least 4  Continue  eliquis 5 mg bid   3. Dizziness Describes as room spinning with position change and in bed with turning over  I do not think this is from Tikosyn  We discussed stopping drug but if the  dizziness does not improve and afib returns may require repeat hospitalization or ablation She will hold off for now  She has not mentioned this to PCP, I asked for her to make an appointment to discuss to cover all the bases    She just had imaging of her neck with Dr. 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 Lalla Brothers on f/u 03/05/22   Elvina Sidle. [facility] [address] [place], [place] [address] [phone]

## 2022-01-25 ENCOUNTER — Ambulatory Visit (HOSPITAL_COMMUNITY): Payer: Medicare HMO | Admitting: Nurse Practitioner

## 2022-03-04 NOTE — Progress Notes (Unsigned)
Electrophysiology Office Follow up Visit Note:    Date:  03/04/2022   ID:  Erin [name], Erin [name] 12-31-1945, MRN [id number]  PCP:  Glendon Axe, MD  [facility] Cardiologist:  Jenkins Rouge, MD  [facility] Electrophysiologist:  Vickie Epley, MD    Interval History:    Erin [name] is a 77 y.o. female who presents for a follow up visit.  I met the patient in July 2023 when she was hospitalized for Tikosyn loading.  She was originally referred for Tikosyn by Dr. Johnsie Cancel. She was last seen by Butch Penny on January 09, 2022.  At that appointment she reported intermittent dizziness since starting Tikosyn.  Her episodes were vertiginous in nature.  She takes Eliquis for stroke prophylaxis.  She was maintaining sinus rhythm on Tikosyn.       Past Medical History:  Diagnosis Date   A-fib [facility])    Anxiety    Arthritis    "all my back is eat up w/it; knees too" (05/24/2016)   CAD (coronary artery disease)    s/p CABG   Chronic bronchitis (HCC)    Chronic lower back pain    Depression    Dyspnea    "since OR 04/2016" (05/24/2016)   Dysrhythmia    Family history of adverse reaction to anesthesia    "daughter gets PONV" (05/24/2016)   GERD (gastroesophageal reflux disease)    occ   High cholesterol    History of stomach ulcers    Hypertension    Hypothyroidism    Migraine    "usually have one monthly; nothing since 04/25/2016)   Pneumonia ~ 2002    Past Surgical History:  Procedure Laterality Date   ANTERIOR CERVICAL DECOMP/DISCECTOMY FUSION  ~ 2009   BACK SURGERY     BACK SURGERY     JUNE  2018   BLEPHAROPLASTY     CARDIOVERSION N/A 07/04/2016   Procedure: CARDIOVERSION;  Surgeon: Josue Hector, MD;  Location: [place];  Service: Cardiovascular;  Laterality: N/A;   CARDIOVERSION N/A 07/30/2016   Procedure: CARDIOVERSION;  Surgeon: Josue Hector, MD;  Location: [place];  Service: Cardiovascular;  Laterality: N/A;   CARDIOVERSION N/A 09/13/2021   Procedure:  CARDIOVERSION;  Surgeon: Sanda Klein, MD;  Location: [place];  Service: Cardiovascular;  Laterality: N/A;   CARPAL TUNNEL RELEASE Bilateral 80's   CATARACT EXTRACTION, BILATERAL     CORONARY ARTERY BYPASS GRAFT N/A 05/30/2016   Procedure: CORONARY ARTERY BYPASS GRAFTING (CABG), ON PUMP, TIMES FOUR, USING LEFT INTERNAL MAMMARY ARTERY AND ENDOSCOPICALLY HARVESTED BILATERAL GREATER SAPHENOUS VEINS WITH CLIPPING OF LEFT ATRIAL APPENDAGE;  Surgeon: Melrose Nakayama, MD;  Location: [place];  Service: Open Heart Surgery;  Laterality: N/A;  LIMA to LAD, SVG to RAMUS INTERMEDIATE, and SVG SEQUENTIALLY to CIRCUMFLEX and PDA   IR FLUORO GUIDED NEEDLE PLC ASPIRATION/INJECTION LOC  07/12/2016   KNEE ARTHROSCOPY Bilateral 2000s   LEFT HEART CATH AND CORONARY ANGIOGRAPHY N/A 05/29/2016   Procedure: Left Heart Cath and Coronary Angiography;  Surgeon: Troy Sine, MD;  Location: [place] CV LAB;  Service: Cardiovascular;  Laterality: N/A;   LUMBAR [place] SURGERY  2006; 07/2005   LUMBAR WOUND DEBRIDEMENT N/A 06/26/2016   Procedure: Incision and Drainage of LUMBAR WOUND;  Surgeon: Kary Kos, MD;  Location: [place];  Service: Neurosurgery;  Laterality: N/A;   POSTERIOR LUMBAR FUSION  04/2016   TEE WITHOUT CARDIOVERSION N/A 07/04/2016   Procedure: TRANSESOPHAGEAL ECHOCARDIOGRAM (TEE);  Surgeon: Josue Hector, MD;  Location: [facility] ENDOSCOPY;  Service: Cardiovascular;  Laterality: N/A;   TEE WITHOUT CARDIOVERSION N/A 07/30/2016   Procedure: TRANSESOPHAGEAL ECHOCARDIOGRAM (TEE);  Surgeon: Josue Hector, MD;  Location: [facility] ENDOSCOPY;  Service: Cardiovascular;  Laterality: N/A;   TONSILLECTOMY     TUBAL LIGATION      Current Medications: No outpatient medications have been marked as taking for the 03/05/22 encounter (Appointment) with Vickie Epley, MD.     Allergies:   Ace inhibitors, Fentanyl, Morphine and related, and Codeine   Social History   Socioeconomic History   Marital status: Divorced     Spouse name: Not on file   Number of children: Not on file   Years of education: Not on file   Highest education level: Not on file  Occupational History   Not on file  Tobacco Use   Smoking status: Never   Smokeless tobacco: Never   Tobacco comments:    Never smoke 09/21/21  Vaping Use   Vaping Use: Never used  Substance and Sexual Activity   Alcohol use: No   Drug use: No   Sexual activity: Not Currently  Other Topics Concern   Not on file  Social History Narrative   Not on file   Social Determinants of Health   Financial Resource Strain: Not on file  Food Insecurity: Not on file  Transportation Needs: Not on file  Physical Activity: Not on file  Stress: Not on file  Social Connections: Not on file     Family History: The patient's family history includes CAD in her sister and son.  ROS:   Please see the history of present illness.    All other systems reviewed and are negative.  EKGs/Labs/Other Studies Reviewed:    The following studies were reviewed today:   EKG:  The ekg ordered today demonstrates ***  Recent Labs: 01/09/2022: BUN 11; Creatinine, Ser 1.00; Magnesium 1.9; Potassium 4.4; Sodium 138  Recent Lipid Panel No results found for: "CHOL", "TRIG", "HDL", "CHOLHDL", "VLDL", "LDLCALC", "LDLDIRECT"  Physical Exam:    VS:  LMP  (LMP Unknown)     Wt Readings from Last 3 Encounters:  01/09/22 148 lb 3.2 oz (67.2 kg)  10/24/21 147 lb (66.7 kg)  09/21/21 148 lb 3.2 oz (67.2 kg)     GEN: *** Well nourished, well developed in no acute distress CARDIAC: ***RRR, no murmurs, rubs, gallops RESPIRATORY:  Clear to auscultation without rales, wheezing or rhonchi  PSYCHIATRIC:  Normal affect        ASSESSMENT:    1. Persistent atrial fibrillation ([place])   2. Chronic systolic congestive heart failure ([place])   3. Encounter for long-term (current) use of high-risk medication    PLAN:    In order of problems listed above:   #Persistent AF Doing  well on dofetilide. Continue current dose. QTc stable for ongoing use. Last Cr OK 01/09/2022.  #Chronic diastolic HF NYHA II. Warm and dry on exam.    Follow up 3 months with APP with blood work at that visit.   Total time spent with patient today *** minutes. This includes reviewing records, evaluating the patient and coordinating care.   Medication Adjustments/Labs and Tests Ordered: Current medicines are reviewed at length with the patient today.  Concerns regarding medicines are outlined above.  No orders of the defined types were placed in this encounter.  No orders of the defined types were placed in this encounter.    Signed, Lars Mage, MD, [facility], [facility] 03/04/2022 7:00  PM    Electrophysiology [facility]

## 2022-03-05 ENCOUNTER — Ambulatory Visit: Payer: Medicare HMO | Attending: Cardiology | Admitting: Cardiology

## 2022-03-05 ENCOUNTER — Encounter: Payer: Self-pay | Admitting: Cardiology

## 2022-03-05 VITALS — BP 130/74 | HR 74 | Ht 65.0 in | Wt 149.0 lb

## 2022-03-05 DIAGNOSIS — I5022 Chronic systolic (congestive) heart failure: Secondary | ICD-10-CM

## 2022-03-05 DIAGNOSIS — Z79899 Other long term (current) drug therapy: Secondary | ICD-10-CM

## 2022-03-05 DIAGNOSIS — I4819 Other persistent atrial fibrillation: Secondary | ICD-10-CM

## 2022-03-05 NOTE — Progress Notes (Signed)
Electrophysiology Office Follow up Visit Note:    Date:  03/05/2022   ID:  Erin [name], Erin [name] [birth year], MRN [id number]  PCP:  Glendon Axe, MD  [facility] Cardiologist:  Jenkins Rouge, MD  [facility] Electrophysiologist:  Vickie Epley, MD    Interval History:    Erin [name] is a 77 y.o. female who presents for a follow up visit.  I met the patient in July 2023 when she was hospitalized for Tikosyn loading.  She was originally referred for Tikosyn by Dr. Johnsie Cancel. She was last seen by Butch Penny on January 09, 2022.  At that appointment she reported intermittent dizziness since starting Tikosyn.  Her episodes were vertiginous in nature.  She takes Eliquis for stroke prophylaxis.  She was maintaining sinus rhythm on Tikosyn.  Today, she reports that her heart rhythm has been normal recently. She had a normal EKG today. She reports dizziness and near syncope when lying down on while on Tikosyn 250 MCG. She describes sensations of the room spinning. She also complains of lightheadedness when sitting down for long periods. She had previously been checked for vertigo, which showed negative results.   She denies any chest pain, shortness of breath, or peripheral edema. No headaches,  orthopnea, or PND.      Past Medical History:  Diagnosis Date   A-fib [facility])    Anxiety    Arthritis    "all my back is eat up w/it; knees too" (05/24/2016)   CAD (coronary artery disease)    s/p CABG   Chronic bronchitis (HCC)    Chronic lower back pain    Depression    Dyspnea    "since OR 04/2016" (05/24/2016)   Dysrhythmia    Family history of adverse reaction to anesthesia    "daughter gets PONV" (05/24/2016)   GERD (gastroesophageal reflux disease)    occ   High cholesterol    History of stomach ulcers    Hypertension    Hypothyroidism    Migraine    "usually have one monthly; nothing since 04/25/2016)   Pneumonia ~ 2002    Past Surgical History:  Procedure Laterality Date   ANTERIOR  CERVICAL DECOMP/DISCECTOMY FUSION  ~ 2009   BACK SURGERY     BACK SURGERY     JUNE  2018   BLEPHAROPLASTY     CARDIOVERSION N/A 07/04/2016   Procedure: CARDIOVERSION;  Surgeon: Josue Hector, MD;  Location: [place];  Service: Cardiovascular;  Laterality: N/A;   CARDIOVERSION N/A 07/30/2016   Procedure: CARDIOVERSION;  Surgeon: Josue Hector, MD;  Location: [employer];  Service: Cardiovascular;  Laterality: N/A;   CARDIOVERSION N/A 09/13/2021   Procedure: CARDIOVERSION;  Surgeon: Sanda Klein, MD;  Location: [place];  Service: Cardiovascular;  Laterality: N/A;   CARPAL TUNNEL RELEASE Bilateral 80's   CATARACT EXTRACTION, BILATERAL     CORONARY ARTERY BYPASS GRAFT N/A 05/30/2016   Procedure: CORONARY ARTERY BYPASS GRAFTING (CABG), ON PUMP, TIMES FOUR, USING LEFT INTERNAL MAMMARY ARTERY AND ENDOSCOPICALLY HARVESTED BILATERAL GREATER SAPHENOUS VEINS WITH CLIPPING OF LEFT ATRIAL APPENDAGE;  Surgeon: Melrose Nakayama, MD;  Location: [place];  Service: Open Heart Surgery;  Laterality: N/A;  LIMA to LAD, SVG to RAMUS INTERMEDIATE, and SVG SEQUENTIALLY to CIRCUMFLEX and PDA   IR FLUORO GUIDED NEEDLE PLC ASPIRATION/INJECTION LOC  07/12/2016   KNEE ARTHROSCOPY Bilateral 2000s   LEFT HEART CATH AND CORONARY ANGIOGRAPHY N/A 05/29/2016   Procedure: Left Heart Cath and Coronary Angiography;  Surgeon: Marcello Moores  Floyce Stakes, MD;  Location: [place] CV LAB;  Service: Cardiovascular;  Laterality: N/A;   LUMBAR [place] SURGERY  2006; 07/2005   LUMBAR WOUND DEBRIDEMENT N/A 06/26/2016   Procedure: Incision and Drainage of LUMBAR WOUND;  Surgeon: Kary Kos, MD;  Location: [place];  Service: Neurosurgery;  Laterality: N/A;   POSTERIOR LUMBAR FUSION  04/2016   TEE WITHOUT CARDIOVERSION N/A 07/04/2016   Procedure: TRANSESOPHAGEAL ECHOCARDIOGRAM (TEE);  Surgeon: Josue Hector, MD;  Location: [facility] ENDOSCOPY;  Service: Cardiovascular;  Laterality: N/A;   TEE WITHOUT CARDIOVERSION N/A 07/30/2016   Procedure:  TRANSESOPHAGEAL ECHOCARDIOGRAM (TEE);  Surgeon: Josue Hector, MD;  Location: [facility] ENDOSCOPY;  Service: Cardiovascular;  Laterality: N/A;   TONSILLECTOMY     TUBAL LIGATION      Current Medications: Current Meds  Medication Sig   apixaban (ELIQUIS) 5 MG TABS tablet Take 1 tablet (5 mg total) by mouth 2 (two) times daily.   atorvastatin (LIPITOR) 80 MG tablet Take 1 tablet (80 mg total) by mouth daily.   Biotin 5 MG TABS Take 5 mg by mouth every morning.   butalbital-acetaminophen-caffeine (FIORICET) 50-325-40 MG tablet Take 1 tablet by mouth as needed.   cetirizine (ZYRTEC) 10 MG tablet Take 10 mg by mouth daily.   cyclobenzaprine (FLEXERIL) 10 MG tablet Take 1 tablet (10 mg total) by mouth at bedtime.   cycloSPORINE (RESTASIS) 0.05 % ophthalmic emulsion Place 1 drop into both eyes 2 (two) times daily.   diltiazem (CARDIZEM CD) 300 MG 24 hr capsule Take 1 capsule (300 mg total) by mouth daily.   dofetilide (TIKOSYN) 250 MCG capsule Take 1 capsule (250 mcg total) by mouth 2 (two) times daily.   ELDERBERRY PO Take by mouth. Taking one chewable by mouth daily   fluticasone (FLONASE) 50 MCG/ACT nasal spray Place 2 sprays into both nostrils daily.   furosemide (LASIX) 40 MG tablet Take 40 mg by mouth daily.   hydrALAZINE (APRESOLINE) 25 MG tablet Take 25 mg by mouth 2 (two) times daily.   levothyroxine (SYNTHROID) 88 MCG tablet Take 88 mcg by mouth daily.   Magnesium Oxide, Antacid, 400 MG TABS Take 1 tablet by mouth daily.   Multiple Vitamins-Minerals (MULTIVITAMIN ADULT) CHEW Taking one chewable by mouth daily   pantoprazole (PROTONIX) 40 MG tablet Take 1 tablet (40 mg total) by mouth at bedtime.   spironolactone (ALDACTONE) 25 MG tablet Take 1 tablet by mouth once daily   zolpidem (AMBIEN CR) 6.25 MG CR tablet Take 6.25 mg by mouth at bedtime as needed.     Allergies:   Ace inhibitors, Fentanyl, Morphine and related, and Codeine   Social History   Socioeconomic History   Marital  status: Divorced    Spouse name: Not on file   Number of children: Not on file   Years of education: Not on file   Highest education level: Not on file  Occupational History   Not on file  Tobacco Use   Smoking status: Never   Smokeless tobacco: Never   Tobacco comments:    Never smoke 09/21/21  Vaping Use   Vaping Use: Never used  Substance and Sexual Activity   Alcohol use: No   Drug use: No   Sexual activity: Not Currently  Other Topics Concern   Not on file  Social History Narrative   Not on file   Social Determinants of Health   Financial Resource Strain: Not on file  Food Insecurity: Not on file  Transportation Needs: Not  on file  Physical Activity: Not on file  Stress: Not on file  Social Connections: Not on file     Family History: The patient's family history includes CAD in her sister and son.  ROS:   Please see the history of present illness.    (+) lightheadedness/dizziness All other systems reviewed and are negative.  EKGs/Labs/Other Studies Reviewed:    The following studies were reviewed today:   EKG:  EKG is personally reviewed. 03/05/2022: Sinus rhythm.  QTc is 455 ms.  Recent Labs: 01/09/2022: BUN 11; Creatinine, Ser 1.00; Magnesium 1.9; Potassium 4.4; Sodium 138  Recent Lipid Panel No results found for: "CHOL", "TRIG", "HDL", "CHOLHDL", "VLDL", "LDLCALC", "LDLDIRECT"  Physical Exam:    VS:  BP 130/74   Pulse 74   Ht 5\' 5"  (1.651 m)   Wt 149 lb (67.6 kg)   LMP  (LMP Unknown)   SpO2 97%   BMI 24.79 kg/m     Wt Readings from Last 3 Encounters:  03/05/22 149 lb (67.6 kg)  01/09/22 148 lb 3.2 oz (67.2 kg)  10/24/21 147 lb (66.7 kg)     GEN:  Well nourished, well developed in no acute distress.  Elderly CARDIAC: RRR, no murmurs, rubs, gallops RESPIRATORY:  Clear to auscultation without rales, wheezing or rhonchi  PSYCHIATRIC:  Normal affect        ASSESSMENT:    1. Persistent atrial fibrillation (HCC)   2. Chronic systolic  congestive heart failure (HCC)   3. Encounter for long-term (current) use of high-risk medication    PLAN:    In order of problems listed above:   #Persistent AF Doing well on dofetilide. Continue current dose. QTc stable for ongoing use. Last Cr OK 01/09/2022.  #Vertigo symptoms Patient describes room spinning with laying on bed on her right side/ear.  She says it is reproducible with that exact head motion.  This seems more consistent with a diagnosis of vertigo rather than lightheadedness and dizziness associated with the Tikosyn.  I recommended discussion with primary care physician regarding possibility of physical therapy referral for Epley maneuvers.  If the episodes of vertigo become more severe/frequent, could consider stopping the decreasing although she understands that that would require repeat hospitalization to reinitiate.  #Chronic diastolic HF NYHA II. Warm and dry on exam.    Follow up: 3 months with APP.  Blood work at that visit.   Medication Adjustments/Labs and Tests Ordered: Current medicines are reviewed at length with the patient today.  Concerns regarding medicines are outlined above.  No orders of the defined types were placed in this encounter.  No orders of the defined types were placed in this encounter.   I,Mitra Faeizi,acting as a 01/11/2022 for Neurosurgeon, MD.,have documented all relevant documentation on the behalf of Lanier Prude, MD,as directed by  Lanier Prude, MD while in the presence of Lanier Prude, MD.  I, Lanier Prude, MD, have reviewed all documentation for this visit. The documentation on 03/05/22 for the exam, diagnosis, procedures, and orders are all accurate and complete.   Signed, 03/07/22, MD, [facility], [facility] 03/05/2022 10:47 AM    Electrophysiology [facility]

## 2022-03-05 NOTE — Patient Instructions (Signed)
Medication Instructions:  Your physician recommends that you continue on your current medications as directed. Please refer to the Current Medication list given to you today.  *If you need a refill on your cardiac medications before your next appointment, please call your pharmacy*   Lab Work: None ordered  Testing/Procedures: None ordered   Follow-Up: At [facility], you and your health needs are our priority.  As part of our continuing mission to provide you with exceptional heart care, we have created designated Provider Care Teams.  These Care Teams include your primary Cardiologist (physician) and Advanced Practice Providers (APPs -  Physician Assistants and Nurse Practitioners) who all work together to provide you with the care you need, when you need it.  We recommend signing up for the patient portal called "MyChart".  Sign up information is provided on this After Visit Summary.  MyChart is used to connect with patients for Virtual Visits (Telemedicine).  Patients are able to view lab/test results, encounter notes, upcoming appointments, etc.  Non-urgent messages can be sent to your provider as well.   To learn more about what you can do with MyChart, go to [email or web address].    Your next appointment:   3 month(s)  The format for your next appointment:   In Person  Provider:   You will see one of the following Advanced Practice Providers on your designated Care Team:   Tommye Standard, [place] Legrand Como "Jonni Sanger" Chalmers Cater, [place]   Thank you for choosing [facility]!!   [phone]  Other Instructions

## 2022-03-29 ENCOUNTER — Encounter (HOSPITAL_COMMUNITY): Payer: Self-pay | Admitting: *Deleted

## 2022-05-01 ENCOUNTER — Other Ambulatory Visit (HOSPITAL_COMMUNITY): Payer: Self-pay | Admitting: Nurse Practitioner

## 2022-06-04 NOTE — Progress Notes (Unsigned)
  Electrophysiology Office Note:   Date:  06/05/2022  ID:  [name], [name] 08/23/1945, MRN [id number]  Primary Cardiologist: Charlton Haws, MD Electrophysiologist: Lanier Prude, MD   History of Present Illness:   Erin [name] is a 77 y.o. female with h/o AF, vertigo, and tikosyn management seen today for routine electrophysiology followup. Since last being seen in our clinic the patient reports doing very well.  she denies chest pain, palpitations, dyspnea, PND, orthopnea, nausea, vomiting, syncope, edema, weight gain, or early satiety.   She does still have vertigo and follows up with PT next week to see if they offer vestibular therapies.   Review of systems complete and found to be negative unless listed in HPI.   Studies Reviewed:    EKG is ordered today. Personal review shows NSR at 30 with ? PAC and stable QTc at ~440-450 ms   Risk Assessment/Calculations:      Physical Exam:   VS:  BP (!) 140/66   Pulse 70   Ht 5\' 5"  (1.651 m)   Wt 148 lb 6.4 oz (67.3 kg)   LMP  (LMP Unknown)   SpO2 98%   BMI 24.70 kg/m    Wt Readings from Last 3 Encounters:  06/05/22 148 lb 6.4 oz (67.3 kg)  03/05/22 149 lb (67.6 kg)  01/09/22 148 lb 3.2 oz (67.2 kg)     GEN: Well nourished, well developed in no acute distress NECK: No JVD; No carotid bruits CARDIAC: Regular rate and rhythm, no murmurs, rubs, gallops RESPIRATORY:  Clear to auscultation without rales, wheezing or rhonchi  ABDOMEN: Soft, non-tender, non-distended EXTREMITIES:  No edema; No deformity   ASSESSMENT AND PLAN:    Persistent AF EKG today shows NSR and stable QT Continue tikosyn 250 mcg BID. Labs today.  Continue eliquis 5 mg BID for CHA2DS2VASc of at least 6.   Vertigo symptoms She follows with physical therapy and sees them next week.  She will ask about Epley maneuvers/vestibular therapies and if a referral is needed can come from me or PCP.    Chronic diastolic HF NYHA I-II symptoms Volume status  stable on exam.   CAD Denies s/s ischemia  Follow up with Dr. Lalla Brothers in 3 months  Signed, Graciella Freer, PA-C

## 2022-06-05 ENCOUNTER — Encounter: Payer: Self-pay | Admitting: Student

## 2022-06-05 ENCOUNTER — Ambulatory Visit: Payer: Medicare HMO | Attending: Student | Admitting: Student

## 2022-06-05 VITALS — BP 140/66 | HR 70 | Ht 65.0 in | Wt 148.4 lb

## 2022-06-05 DIAGNOSIS — I4819 Other persistent atrial fibrillation: Secondary | ICD-10-CM | POA: Diagnosis not present

## 2022-06-05 DIAGNOSIS — I2510 Atherosclerotic heart disease of native coronary artery without angina pectoris: Secondary | ICD-10-CM | POA: Diagnosis not present

## 2022-06-05 NOTE — Patient Instructions (Signed)
Medication Instructions:  Your physician recommends that you continue on your current medications as directed. Please refer to the Current Medication list given to you today.  *If you need a refill on your cardiac medications before your next appointment, please call your pharmacy*  Lab Work: BMET, MAG--TODAY If you have labs (blood work) drawn today and your tests are completely normal, you will receive your results only by: MyChart Message (if you have MyChart) OR A paper copy in the mail If you have any lab test that is abnormal or we need to change your treatment, we will call you to review the results.  Follow-Up: At [facility], you and your health needs are our priority.  As part of our continuing mission to provide you with exceptional heart care, we have created designated Provider Care Teams.  These Care Teams include your primary Cardiologist (physician) and Advanced Practice Providers (APPs -  Physician Assistants and Nurse Practitioners) who all work together to provide you with the care you need, when you need it.  We recommend signing up for the patient portal called "MyChart".  Sign up information is provided on this After Visit Summary.  MyChart is used to connect with patients for Virtual Visits (Telemedicine).  Patients are able to view lab/test results, encounter notes, upcoming appointments, etc.  Non-urgent messages can be sent to your provider as well.   To learn more about what you can do with MyChart, go to [email or web address].    Your next appointment:   3 month(s)  Provider:   Steffanie Dunn, MD

## 2022-06-06 LAB — BASIC METABOLIC PANEL
BUN/Creatinine Ratio: 8 — ABNORMAL LOW (ref 12–28)
BUN: 10 mg/dL (ref 8–27)
CO2: 24 mmol/L (ref 20–29)
Calcium: 9.9 mg/dL (ref 8.7–10.3)
Chloride: 100 mmol/L (ref 96–106)
Creatinine, Ser: 1.3 mg/dL — ABNORMAL HIGH (ref 0.57–1.00)
Glucose: 86 mg/dL (ref 70–99)
Potassium: 4.5 mmol/L (ref 3.5–5.2)
Sodium: 140 mmol/L (ref 134–144)
eGFR: 42 mL/min/{1.73_m2} — ABNORMAL LOW (ref >59)

## 2022-06-06 LAB — MAGNESIUM: Magnesium: 2.2 mg/dL (ref 1.6–2.3)

## 2022-08-06 ENCOUNTER — Telehealth: Payer: Self-pay | Admitting: Cardiovascular Disease

## 2022-08-06 NOTE — Telephone Encounter (Signed)
She will need to talk to her pharmacy. It sounds like they processed under her medicare plan and not her states health plan. She needs to ask them to process under [employer] state health plan and then secondary to Eliquis copay card which they should have on file.

## 2022-08-06 NOTE — Telephone Encounter (Signed)
Patient stated her state plan does not cover Eliquis at all and medicare is saying the cost is $392 she can not get a reduced priced. Medicare told her to reach out to her ordering provider.  She would like to know if there are anymore assistance we can offer.

## 2022-08-06 NOTE — Telephone Encounter (Signed)
Pt c/o medication issue:  1. Name of Medication: apixaban (ELIQUIS) 5 MG TABS tablet   2. How are you currently taking this medication (dosage and times per day)?   Take 1 tablet (5 mg total) by mouth 2 (two) times daily.    3. Are you having a reaction (difficulty breathing--STAT)?   4. What is your medication issue? Patient states had to pay $392 for a 90 day supply.  She states in the past she had a discount card for $30 for a 30 day supply.  She states she can't afford to pay $392 that she paid this last time.  She is wondering if she can get another card or if there is some other medication she can be prescribed.

## 2022-08-07 NOTE — Telephone Encounter (Addendum)
She has 2 insurances. The pharmacy needs to run her rx through her state plan and use the copay card.   Called her pharmacy, they've been running rx through her [employer] Part D plan and apparently don't have her state plan on file. I provided them with this info, they stated a 3 month supply of Eliquis is $141 under that plan. Retail pharmacist then says she can't use copay card because their system makes them check that she does also have [employer].   Have had some pts with state plan who changed to a Medicare version of it, will need to confirm it pt did this as it would explain change in cost. Her 2024 card isn't scanned in Epic to check.  Pt will likely need to call in to get updated copay card info at [phone]. Tried calling pt to let her know this, call went straight to voicemail which is full.

## 2022-08-07 NOTE — Telephone Encounter (Signed)
Patient says her pharmacy stated that with the state plan the Eliquis was $1000 it was cheaper through medicare. She stated she also asked them about good RX and the pharmacist stated it was still cheaper with her medicare plan.

## 2022-08-07 NOTE — Telephone Encounter (Signed)
The state plan covers Eliquis and lets her use the $10 copay card. Did she do what Melissa's message said? This will correct the issue.

## 2022-08-08 NOTE — Telephone Encounter (Signed)
Called pt again, no answer, VM still full.

## 2022-10-17 ENCOUNTER — Encounter: Payer: Self-pay | Admitting: Cardiology

## 2022-10-17 ENCOUNTER — Ambulatory Visit: Payer: Medicare HMO | Admitting: Cardiology

## 2022-10-17 VITALS — BP 124/76 | HR 62 | Ht 65.0 in | Wt 146.0 lb

## 2022-10-17 DIAGNOSIS — I4819 Other persistent atrial fibrillation: Secondary | ICD-10-CM

## 2022-10-17 DIAGNOSIS — Z79899 Other long term (current) drug therapy: Secondary | ICD-10-CM

## 2022-10-17 DIAGNOSIS — I5022 Chronic systolic (congestive) heart failure: Secondary | ICD-10-CM

## 2022-10-17 NOTE — Progress Notes (Signed)
  Electrophysiology Office Follow up Visit Note:    Date:  10/17/2022   ID:  Erin, [name] 12-29-1945, MRN [id number]  PCP:  Erin Damme, MD  [facility] Cardiologist:  Erin Haws, MD  [facility] Electrophysiologist:  Erin Prude, MD    Interval History:    Erin [name] is a 77 y.o. female who presents for a follow up visit.   Last seen June 05, 2022 by Erin [name].  She has atrial fibrillation managed with Tikosyn.  She takes Eliquis for stroke prophylaxis.  She reports intermittent dizziness.  It occurs when standing or sitting.  No syncope.  She continues to take her Tikosyn and Eliquis without missed doses.  She tells me that her allergies are currently acting up and may be causing some wheezing.     Past medical, surgical, social and family history were reviewed.  ROS:   Please see the history of present illness.    All other systems reviewed and are negative.  EKGs/Labs/Other Studies Reviewed:    The following studies were reviewed today:    EKG Interpretation Date/Time:  Wednesday October 17 2022 15:10:23 EDT Ventricular Rate:  62 PR Interval:  162 QRS Duration:  78 QT Interval:  422 QTC Calculation: 428 R Axis:   23  Text Interpretation: Sinus rhythm with marked sinus arrhythmia with occasional Premature ventricular complexes Low voltage QRS Confirmed by Erin [name] [phone]) on 10/17/2022 3:44:29 PM    Physical Exam:    VS:  BP 124/76   Pulse 62   Ht 5\' 5"  (1.651 m)   Wt 146 lb (66.2 kg)   LMP  (LMP Unknown)   SpO2 95%   BMI 24.30 kg/m     Wt Readings from Last 3 Encounters:  10/17/22 146 lb (66.2 kg)  06/05/22 148 lb 6.4 oz (67.3 kg)  03/05/22 149 lb (67.6 kg)     GEN:  Well nourished, well developed in no acute distress CARDIAC: RRR, no murmurs, rubs, gallops RESPIRATORY: Scattered bilateral expiratory wheezes.     ASSESSMENT:    1. Persistent atrial fibrillation (HCC)   2. Encounter for long-term (current) use of  high-risk medication   3. Chronic systolic congestive heart failure (HCC)    PLAN:    In order of problems listed above:  #Persistent atrial fibrillation #High risk medication use-Tikosyn Maintaining sinus rhythm on Tikosyn. QTc acceptable for ongoing Tikosyn use on today's EKG Continue Eliquis  Check BMP and mag today.  #Wheezing I have encouraged her to take a dose of her albuterol inhaler when she gets home.  She is to monitor her symptoms closely.  Should she experience worsening of her respiratory status in the next 24 to 48 hours, she is to reach out to her primary care physician for an urgent appointment.  #Chronic systolic heart failure NYHA class II.  Warm and dry on exam.  Recovered ejection fraction on most recent echocardiogram.  Follows with Erin [name].  History of CABG.  Continue spironolactone, hydralazine, Lasix, diltiazem.  Follow-up 4 months with APP.   Signed, Erin Dunn, MD, [facility], [facility] 10/17/2022 8:09 PM    Electrophysiology [facility]

## 2022-10-17 NOTE — Patient Instructions (Signed)
Medication Instructions:  Your physician recommends that you continue on your current medications as directed. Please refer to the Current Medication list given to you today.  *If you need a refill on your cardiac medications before your next appointment, please call your pharmacy*   Lab Work: TODAY: BMET and Mag  If you have labs (blood work) drawn today and your tests are completely normal, you will receive your results only by: MyChart Message (if you have MyChart) OR A paper copy in the mail If you have any lab test that is abnormal or we need to change your treatment, we will call you to review the results.  Follow-Up: At [facility], you and your health needs are our priority.  As part of our continuing mission to provide you with exceptional heart care, we have created designated Provider Care Teams.  These Care Teams include your primary Cardiologist (physician) and Advanced Practice Providers (APPs -  Physician Assistants and Nurse Practitioners) who all work together to provide you with the care you need, when you need it.  Your next appointment:   4 months  Provider:   You will see one of the following Advanced Practice Providers on your designated Care Team:   Francis Dowse, Charlott Holler [address] [place], [place] Sherie Don, NP Canary Brim, NP   {If

## 2022-10-18 LAB — BASIC METABOLIC PANEL
BUN/Creatinine Ratio: 9 — ABNORMAL LOW (ref 12–28)
BUN: 9 mg/dL (ref 8–27)
CO2: 26 mmol/L (ref 20–29)
Calcium: 9.9 mg/dL (ref 8.7–10.3)
Chloride: 97 mmol/L (ref 96–106)
Creatinine, Ser: 1.03 mg/dL — ABNORMAL HIGH (ref 0.57–1.00)
Glucose: 84 mg/dL (ref 70–99)
Potassium: 3.9 mmol/L (ref 3.5–5.2)
Sodium: 138 mmol/L (ref 134–144)
eGFR: 55 mL/min/{1.73_m2} — ABNORMAL LOW (ref >59)

## 2022-10-18 LAB — MAGNESIUM: Magnesium: 2.1 mg/dL (ref 1.6–2.3)

## 2023-02-18 ENCOUNTER — Ambulatory Visit: Payer: Medicare HMO | Admitting: Pulmonary Disease

## 2023-02-18 NOTE — Progress Notes (Signed)
  Electrophysiology Office Note:   Date:  02/19/2023  ID:  Erin, [name] 08/24/1945, MRN [id number]  Primary Cardiologist: Maude Emmer, MD Electrophysiologist: OLE ONEIDA HOLTS, MD      History of Present Illness:   Erin [name] is a 77 y.o. female with h/o AF, vertigo and tikosyn  management seen today for routine electrophysiology followup.   Since last being seen in our clinic the patient reports doing well from an AF perspective. She feels like she has had more the past month, but has been fighting sinusitis/bronchitis. Currently on cefdinir. Not taking decongestants. Otherwise,  she denies chest pain,  dyspnea, PND, orthopnea, nausea, vomiting, dizziness, syncope, edema, weight gain, or early satiety.   Review of systems complete and found to be negative unless listed in HPI.   EP Information / Studies Reviewed:    EKG is ordered today. Personal review as below.  EKG Interpretation Date/Time:  Tuesday February 19 2023 09:09:22 EST Ventricular Rate:  98 PR Interval:  168 QRS Duration:  78 QT Interval:  364 QTC Calculation: 464 R Axis:   27  Text Interpretation: Sinus rhythm with Premature supraventricular complexes Low voltage QRS Confirmed by Lesia Sharper [phone]) on 02/19/2023 9:16:33 AM     Physical Exam:   VS:  BP 136/70   Pulse 96   Ht 5' 5 (1.651 m)   Wt 142 lb 6.4 oz (64.6 kg)   LMP  (LMP Unknown)   SpO2 96%   BMI 23.70 kg/m    Wt Readings from Last 3 Encounters:  02/19/23 142 lb 6.4 oz (64.6 kg)  10/17/22 146 lb (66.2 kg)  06/05/22 148 lb 6.4 oz (67.3 kg)     GEN: Well nourished, well developed in no acute distress NECK: No JVD; No carotid bruits CARDIAC: Regular rate and rhythm, no murmurs, rubs, gallops RESPIRATORY:  Clear to auscultation without rales, wheezing or rhonchi  ABDOMEN: Soft, non-tender, non-distended EXTREMITIES:  No edema; No deformity   ASSESSMENT AND PLAN:    Persistent AF EKG today shows NSR with stable intervals.   Continue tikosyn  250 mcg BID Continue eliquis  5 mg BID fo CHA2DS2VASc of at least 6  Continue diltiazem  300 mg daily. Declined uptitration  Chronic diastolic HF NYHA II symptoms Volume status stable on exam  CAD Denies s/s ischemia   Follow up with EP APP in 6 months  Signed, Sharper Prentice Lesia, PA-C

## 2023-02-19 ENCOUNTER — Encounter: Payer: Self-pay | Admitting: Student

## 2023-02-19 ENCOUNTER — Ambulatory Visit: Payer: Medicare HMO | Attending: Student | Admitting: Student

## 2023-02-19 VITALS — BP 136/70 | HR 96 | Ht 65.0 in | Wt 142.4 lb

## 2023-02-19 DIAGNOSIS — Z79899 Other long term (current) drug therapy: Secondary | ICD-10-CM | POA: Diagnosis not present

## 2023-02-19 DIAGNOSIS — Z5181 Encounter for therapeutic drug level monitoring: Secondary | ICD-10-CM

## 2023-02-19 DIAGNOSIS — I4819 Other persistent atrial fibrillation: Secondary | ICD-10-CM | POA: Diagnosis not present

## 2023-02-19 LAB — CBC

## 2023-02-19 NOTE — Patient Instructions (Signed)
 Medication Instructions:   Your physician recommends that you continue on your current medications as directed. Please refer to the Current Medication list given to you today.   *If you need a refill on your cardiac medications before your next appointment, please call your pharmacy*   Lab Work:  BMET CBC AND MAG TODAY   If you have labs (blood work) drawn today and your tests are completely normal, you will receive your results only by: MyChart Message (if you have MyChart) OR A paper copy in the mail If you have any lab test that is abnormal or we need to change your treatment, we will call you to review the results.   Testing/Procedures: NONE ORDERED  TODAY      Follow-Up: At [facility], you and your health needs are our priority.  As part of our continuing mission to provide you with exceptional heart care, we have created designated Provider Care Teams.  These Care Teams include your primary Cardiologist (physician) and Advanced Practice Providers (APPs -  Physician Assistants and Nurse Practitioners) who all work together to provide you with the care you need, when you need it.  We recommend signing up for the patient portal called MyChart.  Sign up information is provided on this After Visit Summary.  MyChart is used to connect with patients for Virtual Visits (Telemedicine).  Patients are able to view lab/test results, encounter notes, upcoming appointments, etc.  Non-urgent messages can be sent to your provider as well.   To learn more about what you can do with MyChart, go to [email or web address].    Your next appointment:    6 month(s)  Provider:    You may see one of the following Advanced Practice Providers on your designated Care Team:    Ozell Jodie Passey, [place]  Other Instructions

## 2023-02-20 LAB — BASIC METABOLIC PANEL
BUN/Creatinine Ratio: 13 (ref 12–28)
BUN: 10 mg/dL (ref 8–27)
CO2: 22 mmol/L (ref 20–29)
Calcium: 9.3 mg/dL (ref 8.7–10.3)
Chloride: 100 mmol/L (ref 96–106)
Creatinine, Ser: 0.8 mg/dL (ref 0.57–1.00)
Glucose: 107 mg/dL — ABNORMAL HIGH (ref 70–99)
Potassium: 4 mmol/L (ref 3.5–5.2)
Sodium: 140 mmol/L (ref 134–144)
eGFR: 75 mL/min/{1.73_m2} (ref >59)

## 2023-02-20 LAB — CBC
Hematocrit: 38.9 % (ref 34.0–46.6)
Hemoglobin: 13 g/dL (ref 11.1–15.9)
MCH: 30.2 pg (ref 26.6–33.0)
MCHC: 33.4 g/dL (ref 31.5–35.7)
MCV: 90 fL (ref 79–97)
Platelets: 251 10*3/uL (ref 150–450)
RBC: 4.31 x10E6/uL (ref 3.77–5.28)
RDW: 14.5 % (ref 11.7–15.4)
WBC: 6.2 10*3/uL (ref 3.4–10.8)

## 2023-02-20 LAB — MAGNESIUM: Magnesium: 2 mg/dL (ref 1.6–2.3)

## 2023-02-21 ENCOUNTER — Ambulatory Visit: Payer: Medicare HMO | Admitting: Student

## 2023-05-06 ENCOUNTER — Other Ambulatory Visit (HOSPITAL_COMMUNITY): Payer: Self-pay | Admitting: Cardiology

## 2023-08-07 ENCOUNTER — Other Ambulatory Visit (HOSPITAL_BASED_OUTPATIENT_CLINIC_OR_DEPARTMENT_OTHER): Payer: Self-pay | Admitting: Neurosurgery

## 2023-08-07 DIAGNOSIS — R52 Pain, unspecified: Secondary | ICD-10-CM

## 2023-08-16 ENCOUNTER — Ambulatory Visit (HOSPITAL_BASED_OUTPATIENT_CLINIC_OR_DEPARTMENT_OTHER)
Admission: RE | Admit: 2023-08-16 | Discharge: 2023-08-16 | Disposition: A | Discharge status: Home / Self Care | Source: Ambulatory Visit | Attending: Neurosurgery | Admitting: Neurosurgery

## 2023-08-16 DIAGNOSIS — R52 Pain, unspecified: Secondary | ICD-10-CM | POA: Insufficient documentation

## 2023-09-25 NOTE — Progress Notes (Unsigned)
 Cardiology Office Note Date:  09/25/2023  Patient ID:  Erin [name], Erin [name] January 13, 1946, MRN [id number] PCP:  Erin Round, MD  Cardiologist:  Dr. Delford [name]: Dr. Cindie    Chief Complaint:  *** 55mo visit  History of Present Illness: Erin [name] is a 78 y.o. female with history of  CAD (CABG and LAA clip),  HTN,  Afib,  chronic CHF (systolic)  CABG by Dr Erin [name] 05/30/16 with LAA clipping.  Her course was complicated by [facility] sepsis and paravertebral abscess post lumbar laminectomy from March os 2018 with revision by Dr Erin [name] 06/26/16.   She was readmitted towards the end of July by Dr. Onetha and had  exploration of fusion removal of hardware L2-S1 with removal of bilateral loose L2 pedicle screws which were sent for culture and removal of rods and all top tightening nuts and cross-link  2018, during her CABG hospitalization, she had recurrent PAF - was placed back on Eliquis  and was loaded with amiodarone  and has had TEE cardioversion 07/04/16 and there was thrombus in her LAA but clip was intact with no communication to her LA and no other thrombus.  PFT;s 12/17/17 with restriction and DLCO corrects for alveolar volume CT with small vessel disease Elevated right hemi diaphragm and no ILD  Dr Erin [name] saw on 12/20/17 and felt dyspnea from deconditioning and presumed new right hemidiaphragm elevation. Sniff test was not diagnostic of paralysis but had decreased inferior excursion Referred to [facility] for pulmonary rehab   For EP: seeing myself, Dr. Cindie and Erin [name] over the last couple years  Dr. Cindie last on 10/17/22 Reported some orthostatic dizziness, otherwise doing well, seasonal allergies/wheezing on occasion  QTc was OK Advised close management of her respiratory status/asthma, > PMD Not felt to be volume OL  She saw Erin [name] 02/19/23, some increased symptoms of AFib with recent illness/sinusitis/bronchitis ATc OK No changes made  TODAY  *** cards??? [facility]> Dr.  Maralee? C.rehab?  Erin [name]? *** Tikosyn  <EKG, meds, labs *** symptoms, burden *** needs cards    AFib/AAD hx Amiodarone  stopped remotely with pulm concerns Tikosyn  started July 2023   Past Medical History:  Diagnosis Date   A-fib [facility])    Anxiety    Arthritis    all my back is eat up w/it; knees too (05/24/2016)   CAD (coronary artery disease)    s/p CABG   Chronic bronchitis (HCC)    Chronic lower back pain    Depression    Dyspnea    since OR 04/2016 (05/24/2016)   Dysrhythmia    Family history of adverse reaction to anesthesia    daughter gets PONV (05/24/2016)   GERD (gastroesophageal reflux disease)    occ   High cholesterol    History of stomach ulcers    Hypertension    Hypothyroidism    Migraine    usually have one monthly; nothing since 04/25/2016)   Pneumonia ~ 2002    Past Surgical History:  Procedure Laterality Date   ANTERIOR CERVICAL DECOMP/DISCECTOMY FUSION  ~ 2009   BACK SURGERY     BACK SURGERY     JUNE  2018   BLEPHAROPLASTY     CARDIOVERSION N/A 07/04/2016   Procedure: CARDIOVERSION;  Surgeon: Erin Maude BROCKS, MD;  Location: [facility] ENDOSCOPY;  Service: Cardiovascular;  Laterality: N/A;   CARDIOVERSION N/A 07/30/2016   Procedure: CARDIOVERSION;  Surgeon: Erin Maude BROCKS, MD;  Location: [facility] ENDOSCOPY;  Service: Cardiovascular;  Laterality: N/A;   CARDIOVERSION N/A  09/13/2021   Procedure: CARDIOVERSION;  Surgeon: Francyne Headland, MD;  Location: [facility] ENDOSCOPY;  Service: Cardiovascular;  Laterality: N/A;   CARPAL TUNNEL RELEASE Bilateral 80's   CATARACT EXTRACTION, BILATERAL     CORONARY ARTERY BYPASS GRAFT N/A 05/30/2016   Procedure: CORONARY ARTERY BYPASS GRAFTING (CABG), ON PUMP, TIMES FOUR, USING LEFT INTERNAL MAMMARY ARTERY AND ENDOSCOPICALLY HARVESTED BILATERAL GREATER SAPHENOUS VEINS WITH CLIPPING OF LEFT ATRIAL APPENDAGE;  Surgeon: Elspeth JAYSON Millers, MD;  Location: [facility] OR;  Service: Open Heart Surgery;  Laterality: N/A;  LIMA to LAD, SVG to RAMUS  INTERMEDIATE, and SVG SEQUENTIALLY to CIRCUMFLEX and PDA   IR FLUORO GUIDED NEEDLE PLC ASPIRATION/INJECTION LOC  07/12/2016   KNEE ARTHROSCOPY Bilateral 2000s   LEFT HEART CATH AND CORONARY ANGIOGRAPHY N/A 05/29/2016   Procedure: Left Heart Cath and Coronary Angiography;  Surgeon: Debby DELENA Sor, MD;  Location: [facility] INVASIVE CV LAB;  Service: Cardiovascular;  Laterality: N/A;   LUMBAR DISC SURGERY  2006; 07/2005   LUMBAR WOUND DEBRIDEMENT N/A 06/26/2016   Procedure: Incision and Drainage of LUMBAR WOUND;  Surgeon: Erin [name] Kuba, MD;  Location: [facility] OR;  Service: Neurosurgery;  Laterality: N/A;   POSTERIOR LUMBAR FUSION  04/2016   TEE WITHOUT CARDIOVERSION N/A 07/04/2016   Procedure: TRANSESOPHAGEAL ECHOCARDIOGRAM (TEE);  Surgeon: Erin Maude JAYSON, MD;  Location: [facility] ENDOSCOPY;  Service: Cardiovascular;  Laterality: N/A;   TEE WITHOUT CARDIOVERSION N/A 07/30/2016   Procedure: TRANSESOPHAGEAL ECHOCARDIOGRAM (TEE);  Surgeon: Erin Maude JAYSON, MD;  Location: [facility] ENDOSCOPY;  Service: Cardiovascular;  Laterality: N/A;   TONSILLECTOMY     TUBAL LIGATION      Current Outpatient Medications  Medication Sig Dispense Refill   apixaban  (ELIQUIS ) 5 MG TABS tablet Take 1 tablet (5 mg total) by mouth 2 (two) times daily. 180 tablet 1   atorvastatin  (LIPITOR ) 80 MG tablet Take 1 tablet (80 mg total) by mouth daily. 90 tablet 3   Biotin  5 MG TABS Take 5 mg by mouth every morning.     butalbital -acetaminophen -caffeine  (FIORICET) 50-325-40 MG tablet Take 1 tablet by mouth as needed.     cetirizine (ZYRTEC) 10 MG tablet Take 10 mg by mouth daily.     cyclobenzaprine  (FLEXERIL ) 10 MG tablet Take 1 tablet (10 mg total) by mouth at bedtime.     cycloSPORINE  (RESTASIS ) 0.05 % ophthalmic emulsion Place 1 drop into both eyes 2 (two) times daily.     diltiazem  (CARDIZEM  CD) 300 MG 24 hr capsule Take 1 capsule (300 mg total) by mouth daily. 90 capsule 3   dofetilide  (TIKOSYN ) 250 MCG capsule Take 1 capsule by mouth twice daily 180  capsule 2   ELDERBERRY PO Take by mouth. Taking one chewable by mouth daily     fluticasone (FLONASE) 50 MCG/ACT nasal spray Place 2 sprays into both nostrils daily.     furosemide  (LASIX ) 40 MG tablet Take 40 mg by mouth daily.     hydrALAZINE  (APRESOLINE ) 25 MG tablet Take 25 mg by mouth 2 (two) times daily.     levothyroxine  (SYNTHROID ) 88 MCG tablet Take 88 mcg by mouth daily.     Magnesium  Oxide, Antacid, 400 MG TABS Take 1 tablet by mouth daily.     Multiple Vitamins-Minerals (MULTIVITAMIN ADULT) CHEW Taking one chewable by mouth daily     pantoprazole  (PROTONIX ) 40 MG tablet Take 1 tablet (40 mg total) by mouth at bedtime. 30 tablet 0   spironolactone  (ALDACTONE ) 25 MG tablet Take 1 tablet by mouth once daily 90 tablet  3   zolpidem  (AMBIEN  CR) 6.25 MG CR tablet Take 6.25 mg by mouth at bedtime as needed.     No current facility-administered medications for this visit.    Allergies:   Ace inhibitors, Fentanyl , Morphine  and codeine, and Codeine   Social History:  The patient  reports that she has never smoked. She has never used smokeless tobacco. She reports that she does not drink alcohol and does not use drugs.   Family History:  The patient's family history includes CAD in her sister and son.  ROS:  Please see the history of present illness.    All other systems are reviewed and otherwise negative.   PHYSICAL EXAM:  VS:  LMP  (LMP Unknown)  BMI: There is no height or weight on file to calculate BMI. Well nourished, well developed, in no acute distress HEENT: normocephalic, atraumatic Neck: no JVD, carotid bruits or masses Cardiac: *** RRR; extrasystolesno significant murmurs, no rubs, or gallops Lungs:*** CTA b/l, no wheezing, rhonchi or rales Abd: soft, nontender MS: no deformity or atrophy Ext: *** no edema Skin: warm and dry, no rash Neuro:  No gross deficits appreciated Psych: euthymic mood, full affect   EKG:  Done today and reviewed by myself shows   ***   12/17/2019: TTE  1. Left ventricular ejection fraction, by estimation, is 60 to 65%. The  left ventricle has normal function. The left ventricle has no regional  wall motion abnormalities. There is moderate asymmetric left ventricular  hypertrophy of the basal-septal  segment. Left ventricular diastolic parameters are consistent with Grade I  diastolic dysfunction (impaired relaxation).   2. Right ventricular systolic function is normal. The right ventricular  size is normal. There is normal pulmonary artery systolic pressure. The  estimated right ventricular systolic pressure is 22.9 mmHg.   3. The mitral valve is grossly normal. No evidence of mitral valve  regurgitation. No evidence of mitral stenosis.   4. The aortic valve is tricuspid. There is moderate calcification of the  aortic valve. There is moderate thickening of the aortic valve. Aortic  valve regurgitation is not visualized. Mild to moderate aortic valve  sclerosis/calcification is present,  without any evidence of aortic stenosis.   5. The inferior vena cava is normal in size with greater than 50%  respiratory variability, suggesting right atrial pressure of 3 mmHg.   Comparison(s): No significant change from prior study.   Recent Labs: 02/19/2023: BUN 10; Creatinine, Ser 0.80; Hemoglobin 13.0; Magnesium  2.0; Platelets 251; Potassium 4.0; Sodium 140  No results found for requested labs within last 365 days.   CrCl cannot be calculated (Patient's most recent lab result is older than the maximum 21 days allowed.).   Wt Readings from Last 3 Encounters:  02/19/23 142 lb 6.4 oz (64.6 kg)  10/17/22 146 lb (66.2 kg)  06/05/22 148 lb 6.4 oz (67.3 kg)     Other studies reviewed: Additional studies/records reviewed today include: summarized above  ASSESSMENT AND PLAN:  Persistent AFib Has LAA clipped CH2A2DS2Vasc is 5, on Eliquis , *** appropriately dosed minimal burden by symptoms Tikosyn  w/*** stable  QTc ***   CAD *** No anginal symptoms C/w Dr. PIERRETTE Emmer    Chronic CHF (systolic) Recovered LVEF by her last echo 2021 *** No exam findings of volume OL Chronic unchanged DOE, ***    Disposition: F/u with EP in ***, sooner if needed  Current medicines are reviewed at length with the patient today.  The patient did not have any  concerns regarding medicines.  Erin Charlies Arthur, PA-C 09/25/2023 7:46 AM     [facility] [address] [place] [address] [phone] (office)  [phone] (fax)

## 2023-09-26 ENCOUNTER — Encounter: Payer: Self-pay | Admitting: Physician Assistant

## 2023-09-26 ENCOUNTER — Ambulatory Visit: Attending: Physician Assistant | Admitting: Physician Assistant

## 2023-09-26 VITALS — BP 100/58 | HR 65 | Ht 65.0 in | Wt 139.0 lb

## 2023-09-26 DIAGNOSIS — D6869 Other thrombophilia: Secondary | ICD-10-CM

## 2023-09-26 DIAGNOSIS — I2510 Atherosclerotic heart disease of native coronary artery without angina pectoris: Secondary | ICD-10-CM

## 2023-09-26 DIAGNOSIS — Z79899 Other long term (current) drug therapy: Secondary | ICD-10-CM

## 2023-09-26 DIAGNOSIS — Z5181 Encounter for therapeutic drug level monitoring: Secondary | ICD-10-CM | POA: Diagnosis not present

## 2023-09-26 DIAGNOSIS — I4819 Other persistent atrial fibrillation: Secondary | ICD-10-CM | POA: Diagnosis not present

## 2023-09-26 NOTE — Patient Instructions (Signed)
 Medication Instructions:   Your physician recommends that you continue on your current medications as directed. Please refer to the Current Medication list given to you today.   *If you need a refill on your cardiac medications before your next appointment, please call your pharmacy*   Lab Work:  PLEASE GO DOWN STAIRS  [facility]  FIRST FLOOR   ( GET OFF ELEVATORS WALK TOWARDS WAITING AREA LAB LOCATED BY PHARMACY):  BMET MAG AND TSH  CBC TODAY     If you have labs (blood work) drawn today and your tests are completely normal, you will receive your results only by: MyChart Message (if you have MyChart) OR A paper copy in the mail If you have any lab test that is abnormal or we need to change your treatment, we will call you to review the results.    Testing/Procedures: NONE ORDERED  TODAY    Follow-Up: At [facility], you and your health needs are our priority.  As part of our continuing mission to provide you with exceptional heart care, our providers are all part of one team.  This team includes your primary Cardiologist (physician) and Advanced Practice Providers or APPs (Physician Assistants and Nurse Practitioners) who all work together to provide you with the care you need, when you need it.  Your next appointment:    6 month(s) .  Provider:    You may see OLE ONEIDA HOLTS, MD  or one of the following Advanced Practice Providers on your designated Care Team:   Charlies Arthur, [place]   We recommend signing up for the patient portal called MyChart.  Sign up information is provided on this After Visit Summary.  MyChart is used to connect with patients for Virtual Visits (Telemedicine).  Patients are able to view lab/test results, encounter notes, upcoming appointments, etc.  Non-urgent messages can be sent to your provider as well.   To learn more about what you can do with MyChart, go to [email or web address].   Other Instructions

## 2023-09-27 LAB — CBC
Hematocrit: 38.9 % (ref 34.0–46.6)
Hemoglobin: 12.5 g/dL (ref 11.1–15.9)
MCH: 28.5 pg (ref 26.6–33.0)
MCHC: 32.1 g/dL (ref 31.5–35.7)
MCV: 89 fL (ref 79–97)
Platelets: 155 x10E3/uL (ref 150–450)
RBC: 4.38 x10E6/uL (ref 3.77–5.28)
RDW: 16.6 % — ABNORMAL HIGH (ref 11.7–15.4)
WBC: 6.9 x10E3/uL (ref 3.4–10.8)

## 2023-09-27 LAB — BASIC METABOLIC PANEL WITH GFR
BUN/Creatinine Ratio: 11 — AB (ref 12–28)
BUN: 12 mg/dL (ref 8–27)
CO2: 22 mmol/L (ref 20–29)
Calcium: 10.2 mg/dL (ref 8.7–10.3)
Chloride: 99 mmol/L (ref 96–106)
Creatinine, Ser: 1.08 mg/dL — AB (ref 0.57–1.00)
Glucose: 103 mg/dL — AB (ref 70–99)
Potassium: 5.2 mmol/L (ref 3.5–5.2)
Sodium: 138 mmol/L (ref 134–144)
eGFR: 52 mL/min/1.73 — AB (ref >59)

## 2023-09-27 LAB — MAGNESIUM: Magnesium: 2 mg/dL (ref 1.6–2.3)

## 2023-09-30 ENCOUNTER — Ambulatory Visit: Payer: Self-pay | Admitting: Physician Assistant

## 2023-11-25 NOTE — Progress Notes (Signed)
 Cardiology Office Note Date:  12/03/2023  Patient ID:  Erin [name], Erin [name] May 17, 1945, MRN [id number] PCP:  Claudene Round, MD  Cardiologist:  Dr. Delford Electrophysiologist: Dr. Cindie    Chief Complaint:  PAF   History of Present Illness: Erin [name] is a 78 y.o. female with history of  CAD (CABG and LAA clip),  HTN,  Afib,  chronic CHF (systolic)  I have not seen her in 2.5 years. Seen mostly by EP.  CABG by Dr Kerrin 05/30/16 with LAA clipping.  Her course was complicated by [facility] sepsis and paravertebral abscess post lumbar laminectomy from March os 2018 with revision by Dr Onetha 06/26/16.   She was readmitted towards the end of July by Dr. Onetha and had  exploration of fusion removal of hardware L2-S1 with removal of bilateral loose L2 pedicle screws which were sent for culture and removal of rods and all top tightening nuts and cross-link  2018, during her CABG hospitalization, she had recurrent PAF - was placed back on Eliquis  and was loaded with amiodarone  and has had TEE cardioversion 07/04/16 and there was thrombus in her LAA but clip was intact with no communication to her LA and no other thrombus.  PFT;s 12/17/17 with restriction and DLCO corrects for alveolar volume CT with small vessel disease Elevated right hemi diaphragm and no ILD  Dr Shelah saw on 12/20/17 and felt dyspnea from deconditioning and presumed new right hemidiaphragm elevation. Sniff test was not diagnostic of paralysis but had decreased inferior excursion Referred to [facility] for pulmonary rehab   With dyspnea and lung dx amiodarone  d/c Has been on eliquis  CHADVASC 5 and LAA clip intact. Monitor 01/24/22 no PAF. She was supposed to have updated TTE not done Started on Tikosyn  July 2023 QT/QRS have been fine   Son is a big [employer] with [employer] [place] [id number] [facility]    AFib/AAD hx Amiodarone  stopped remotely with pulm concerns Tikosyn  2023    Past Medical History:  Diagnosis Date   A-fib [facility])     Anxiety    Arthritis    all my back is eat up w/it; knees too (05/24/2016)   CAD (coronary artery disease)    s/p CABG   Chronic bronchitis (HCC)    Chronic lower back pain    Depression    Dyspnea    since OR 04/2016 (05/24/2016)   Dysrhythmia    Family history of adverse reaction to anesthesia    daughter gets PONV (05/24/2016)   GERD (gastroesophageal reflux disease)    occ   High cholesterol    History of stomach ulcers    Hypertension    Hypothyroidism    Migraine    usually have one monthly; nothing since 04/25/2016)   Pneumonia ~ 2002    Past Surgical History:  Procedure Laterality Date   ANTERIOR CERVICAL DECOMP/DISCECTOMY FUSION  ~ 2009   BACK SURGERY     BACK SURGERY     JUNE  2018   BLEPHAROPLASTY     CARDIOVERSION N/A 07/04/2016   Procedure: CARDIOVERSION;  Surgeon: Delford Maude BROCKS, MD;  Location: [facility] ENDOSCOPY;  Service: Cardiovascular;  Laterality: N/A;   CARDIOVERSION N/A 07/30/2016   Procedure: CARDIOVERSION;  Surgeon: Delford Maude BROCKS, MD;  Location: [facility] ENDOSCOPY;  Service: Cardiovascular;  Laterality: N/A;   CARDIOVERSION N/A 09/13/2021   Procedure: CARDIOVERSION;  Surgeon: Francyne Headland, MD;  Location: [facility] ENDOSCOPY;  Service: Cardiovascular;  Laterality: N/A;   CARPAL TUNNEL  RELEASE Bilateral 80's   CATARACT EXTRACTION, BILATERAL     CORONARY ARTERY BYPASS GRAFT N/A 05/30/2016   Procedure: CORONARY ARTERY BYPASS GRAFTING (CABG), ON PUMP, TIMES FOUR, USING LEFT INTERNAL MAMMARY ARTERY AND ENDOSCOPICALLY HARVESTED BILATERAL GREATER SAPHENOUS VEINS WITH CLIPPING OF LEFT ATRIAL APPENDAGE;  Surgeon: Elspeth JAYSON Millers, MD;  Location: [facility] OR;  Service: Open Heart Surgery;  Laterality: N/A;  LIMA to LAD, SVG to RAMUS INTERMEDIATE, and SVG SEQUENTIALLY to CIRCUMFLEX and PDA   IR FLUORO GUIDED NEEDLE PLC ASPIRATION/INJECTION LOC  07/12/2016   KNEE ARTHROSCOPY Bilateral 2000s   LEFT HEART CATH AND CORONARY ANGIOGRAPHY N/A 05/29/2016   Procedure: Left Heart Cath and  Coronary Angiography;  Surgeon: Debby DELENA Sor, MD;  Location: [facility] INVASIVE CV LAB;  Service: Cardiovascular;  Laterality: N/A;   LUMBAR DISC SURGERY  2006; 07/2005   LUMBAR WOUND DEBRIDEMENT N/A 06/26/2016   Procedure: Incision and Drainage of LUMBAR WOUND;  Surgeon: Onetha Kuba, MD;  Location: [facility] OR;  Service: Neurosurgery;  Laterality: N/A;   POSTERIOR LUMBAR FUSION  04/2016   TEE WITHOUT CARDIOVERSION N/A 07/04/2016   Procedure: TRANSESOPHAGEAL ECHOCARDIOGRAM (TEE);  Surgeon: Delford Maude JAYSON, MD;  Location: [facility] ENDOSCOPY;  Service: Cardiovascular;  Laterality: N/A;   TEE WITHOUT CARDIOVERSION N/A 07/30/2016   Procedure: TRANSESOPHAGEAL ECHOCARDIOGRAM (TEE);  Surgeon: Delford Maude JAYSON, MD;  Location: [facility] ENDOSCOPY;  Service: Cardiovascular;  Laterality: N/A;   TONSILLECTOMY     TUBAL LIGATION      Current Outpatient Medications  Medication Sig Dispense Refill   apixaban  (ELIQUIS ) 5 MG TABS tablet Take 1 tablet (5 mg total) by mouth 2 (two) times daily. 180 tablet 1   atorvastatin  (LIPITOR ) 80 MG tablet Take 1 tablet (80 mg total) by mouth daily. 90 tablet 3   Biotin  5 MG TABS Take 5 mg by mouth every morning.     butalbital -acetaminophen -caffeine  (FIORICET) 50-325-40 MG tablet Take 1 tablet by mouth as needed.     CALCIUM  PO Take 40 mg by mouth daily at 2 am.     cetirizine (ZYRTEC) 10 MG tablet Take 10 mg by mouth daily.     cyclobenzaprine  (FLEXERIL ) 10 MG tablet Take 1 tablet (10 mg total) by mouth at bedtime.     cycloSPORINE  (RESTASIS ) 0.05 % ophthalmic emulsion Place 1 drop into both eyes 2 (two) times daily.     diltiazem  (CARDIZEM  CD) 300 MG 24 hr capsule Take 1 capsule (300 mg total) by mouth daily. 90 capsule 3   dofetilide  (TIKOSYN ) 250 MCG capsule Take 1 capsule by mouth twice daily 180 capsule 2   ELDERBERRY PO Take by mouth. Taking one chewable by mouth daily     fluticasone (FLONASE) 50 MCG/ACT nasal spray Place 2 sprays into both nostrils daily. (Patient taking differently: Place  2 sprays into both nostrils daily as needed.)     furosemide  (LASIX ) 40 MG tablet Take 40 mg by mouth daily.     hydrALAZINE  (APRESOLINE ) 25 MG tablet Take 25 mg by mouth 2 (two) times daily.     levothyroxine  (SYNTHROID ) 88 MCG tablet Take 88 mcg by mouth daily.     Magnesium  Oxide, Antacid, 400 MG TABS Take 1 tablet by mouth daily.     Multiple Vitamins-Minerals (MULTIVITAMIN ADULT) CHEW Taking one chewable by mouth daily     pantoprazole  (PROTONIX ) 40 MG tablet Take 1 tablet (40 mg total) by mouth at bedtime. 30 tablet 0   spironolactone  (ALDACTONE ) 25 MG tablet Take 1 tablet by mouth once  daily 90 tablet 3   zolpidem  (AMBIEN  CR) 6.25 MG CR tablet Take 6.25 mg by mouth at bedtime as needed.     No current facility-administered medications for this visit.    Allergies:   Ace inhibitors, Fentanyl , Morphine  and codeine, and Codeine   Social History:  The patient  reports that she has never smoked. She has never used smokeless tobacco. She reports that she does not drink alcohol and does not use drugs.   Family History:  The patient's family history includes CAD in her sister and son.  ROS:  Please see the history of present illness.    All other systems are reviewed and otherwise negative.   PHYSICAL EXAM:  VS:  BP (!) 145/82 (BP Location: Left Arm, Patient Position: Sitting)   Pulse 90   Ht 5' 6 (1.676 m)   Wt 137 lb (62.1 kg)   LMP  (LMP Unknown)   SpO2 97%   BMI 22.11 kg/m  BMI: Body mass index is 22.11 kg/m. Well nourished, well developed, in no acute distress HEENT: normocephalic, atraumatic Neck: no JVD, carotid bruits or masses Cardiac: RRR; extrasystolesno significant murmurs, no rubs, or gallops Lungs: CTA b/l, no wheezing, rhonchi or rales Abd: soft, nontender MS: no deformity or atrophy Ext: no edema Skin: warm and dry, no rash Neuro:  No gross deficits appreciated Psych: euthymic mood, full affect   EKG:  Done today and reviewed by myself shows  SR/sinus  arrhythmia 65bpm, QTc is 422   12/17/2019: TTE  1. Left ventricular ejection fraction, by estimation, is 60 to 65%. The  left ventricle has normal function. The left ventricle has no regional  wall motion abnormalities. There is moderate asymmetric left ventricular  hypertrophy of the basal-septal  segment. Left ventricular diastolic parameters are consistent with Grade I  diastolic dysfunction (impaired relaxation).   2. Right ventricular systolic function is normal. The right ventricular  size is normal. There is normal pulmonary artery systolic pressure. The  estimated right ventricular systolic pressure is 22.9 mmHg.   3. The mitral valve is grossly normal. No evidence of mitral valve  regurgitation. No evidence of mitral stenosis.   4. The aortic valve is tricuspid. There is moderate calcification of the  aortic valve. There is moderate thickening of the aortic valve. Aortic  valve regurgitation is not visualized. Mild to moderate aortic valve  sclerosis/calcification is present,  without any evidence of aortic stenosis.   5. The inferior vena cava is normal in size with greater than 50%  respiratory variability, suggesting right atrial pressure of 3 mmHg.   Comparison(s): No significant change from prior study.   Recent Labs: 09/26/2023: BUN 12; Creatinine, Ser 1.08; Hemoglobin 12.5; Magnesium  2.0; Platelets 155; Potassium 5.2; Sodium 138  No results found for requested labs within last 365 days.   CrCl cannot be calculated (Patient's most recent lab result is older than the maximum 21 days allowed.).   Wt Readings from Last 3 Encounters:  12/03/23 137 lb (62.1 kg)  09/26/23 139 lb (63 kg)  02/19/23 142 lb 6.4 oz (64.6 kg)     Other studies reviewed: Additional studies/records reviewed today include: summarized above  ASSESSMENT AND PLAN:  Persistent AFib Has LAA clipped CH2A2DS2Vasc is 5, on Eliquis , appropriately dosed minimal burden by symptoms Tikosyn  w/stable  QTc Very good rhythm control   CAD CABG 2018  No anginal symptoms LIMA to LAD, SVG Ramus, SVG sequential to LCX/PDA   Chronic CHF (systolic) Recovered LVEF by  her last echo 2021 No exam findings of volume OL No symptoms or exam findings of volume OL Update TTE   4.  Secondary hypercoagulable state  Echo for EF PAF post CABG  Disposition: F/u with EP in 80mo, and me in a year   Current medicines are reviewed at length with the patient today.  The patient did not have any concerns regarding medicines.  Signed, Maude Emmer MD [facility]

## 2023-12-03 ENCOUNTER — Ambulatory Visit: Attending: Cardiovascular Disease | Admitting: Cardiovascular Disease

## 2023-12-03 ENCOUNTER — Encounter: Payer: Self-pay | Admitting: Cardiovascular Disease

## 2023-12-03 VITALS — BP 145/82 | HR 90 | Ht 66.0 in | Wt 137.0 lb

## 2023-12-03 DIAGNOSIS — I5022 Chronic systolic (congestive) heart failure: Secondary | ICD-10-CM | POA: Diagnosis not present

## 2023-12-03 DIAGNOSIS — I4819 Other persistent atrial fibrillation: Secondary | ICD-10-CM

## 2023-12-03 DIAGNOSIS — I2510 Atherosclerotic heart disease of native coronary artery without angina pectoris: Secondary | ICD-10-CM

## 2023-12-03 NOTE — Patient Instructions (Signed)
 Medication Instructions:  Your physician recommends that you continue on your current medications as directed. Please refer to the Current Medication list given to you today.  *If you need a refill on your cardiac medications before your next appointment, please call your pharmacy*  Lab Work: NONE  If you have labs (blood work) drawn today and your tests are completely normal, you will receive your results only by: MyChart Message (if you have MyChart) OR A paper copy in the mail If you have any lab test that is abnormal or we need to change your treatment, we will call you to review the results.  Testing/Procedures: Your physician has requested that you have an echocardiogram. Echocardiography is a painless test that uses sound waves to create images of your heart. It provides your doctor with information about the size and shape of your heart and how well your heart's chambers and valves are working. This procedure takes approximately one hour. There are no restrictions for this procedure. Please do NOT wear cologne, perfume, aftershave, or lotions (deodorant is allowed). Please arrive 15 minutes prior to your appointment time.  Please note: We ask at that you not bring children with you during ultrasound (echo/ vascular) testing. Due to room size and safety concerns, children are not allowed in the ultrasound rooms during exams. Our front office staff cannot provide observation of children in our lobby area while testing is being conducted. An adult accompanying a patient to their appointment will only be allowed in the ultrasound room at the discretion of the ultrasound technician under special circumstances. We apologize for any inconvenience.   Follow-Up: At [facility], you and your health needs are our priority.  As part of our continuing mission to provide you with exceptional heart care, our providers are all part of one team.  This team includes your primary Cardiologist  (physician) and Advanced Practice Providers or APPs (Physician Assistants and Nurse Practitioners) who all work together to provide you with the care you need, when you need it.  Your next appointment:   6 month(s)  Provider:   One of our Advanced Practice Providers (APPs): Morse Clause, PA-C  Lamarr Satterfield, NP Miriam Shams, NP  Olivia Pavy, PA-C Josefa Beauvais, NP  Leontine Salen, PA-C Orren Fabry, PA-C  [place], PA-C Ernest Dick, NP  Damien Braver, NP Jon Hails, PA-C  Waddell Donath, PA-C    Dayna Dunn, PA-C  Scott Weaver, PA-C Lum Louis, NP Katlyn West, NP Callie Goodrich, PA-C  Xika Zhao, NP Sheng Haley, PA-C    Kathleen Johnson, PA-C   Then, Maude Emmer, MD will plan to see you again in 1 year(s).    We recommend signing up for the patient portal called MyChart.  Sign up information is provided on this After Visit Summary.  MyChart is used to connect with patients for Virtual Visits (Telemedicine).  Patients are able to view lab/test results, encounter notes, upcoming appointments, etc.  Non-urgent messages can be sent to your provider as well.   To learn more about what you can do with MyChart, go to [email or web address].

## 2024-01-14 ENCOUNTER — Ambulatory Visit (HOSPITAL_COMMUNITY)
Admission: RE | Admit: 2024-01-14 | Discharge: 2024-01-14 | Disposition: A | Discharge status: Home / Self Care | Source: Ambulatory Visit | Attending: Internal Medicine | Admitting: Internal Medicine

## 2024-01-14 ENCOUNTER — Ambulatory Visit: Payer: Self-pay | Admitting: Cardiovascular Disease

## 2024-01-14 DIAGNOSIS — I5022 Chronic systolic (congestive) heart failure: Secondary | ICD-10-CM | POA: Insufficient documentation

## 2024-01-14 LAB — ECHOCARDIOGRAM COMPLETE
Area-P 1/2: 4.12 cm2
S' Lateral: 2.5 cm

## 2024-01-30 ENCOUNTER — Encounter (HOSPITAL_BASED_OUTPATIENT_CLINIC_OR_DEPARTMENT_OTHER): Payer: Self-pay

## 2024-01-30 ENCOUNTER — Emergency Department (HOSPITAL_BASED_OUTPATIENT_CLINIC_OR_DEPARTMENT_OTHER)

## 2024-01-30 ENCOUNTER — Emergency Department (HOSPITAL_BASED_OUTPATIENT_CLINIC_OR_DEPARTMENT_OTHER)
Admission: EM | Admit: 2024-01-30 | Discharge: 2024-01-30 | Disposition: A | Discharge status: Home / Self Care | Attending: Emergency Medicine | Admitting: Emergency Medicine

## 2024-01-30 ENCOUNTER — Other Ambulatory Visit: Payer: Self-pay

## 2024-01-30 DIAGNOSIS — I4891 Unspecified atrial fibrillation: Secondary | ICD-10-CM | POA: Insufficient documentation

## 2024-01-30 DIAGNOSIS — S29002A Unspecified injury of muscle and tendon of back wall of thorax, initial encounter: Secondary | ICD-10-CM | POA: Diagnosis present

## 2024-01-30 DIAGNOSIS — S51811A Laceration without foreign body of right forearm, initial encounter: Secondary | ICD-10-CM | POA: Diagnosis not present

## 2024-01-30 DIAGNOSIS — S22080A Wedge compression fracture of T11-T12 vertebra, initial encounter for closed fracture: Secondary | ICD-10-CM

## 2024-01-30 DIAGNOSIS — S0003XA Contusion of scalp, initial encounter: Secondary | ICD-10-CM | POA: Insufficient documentation

## 2024-01-30 DIAGNOSIS — I10 Essential (primary) hypertension: Secondary | ICD-10-CM | POA: Insufficient documentation

## 2024-01-30 DIAGNOSIS — S22089A Unspecified fracture of T11-T12 vertebra, initial encounter for closed fracture: Secondary | ICD-10-CM | POA: Diagnosis not present

## 2024-01-30 DIAGNOSIS — E039 Hypothyroidism, unspecified: Secondary | ICD-10-CM | POA: Diagnosis not present

## 2024-01-30 DIAGNOSIS — Z7901 Long term (current) use of anticoagulants: Secondary | ICD-10-CM | POA: Diagnosis not present

## 2024-01-30 DIAGNOSIS — S40011A Contusion of right shoulder, initial encounter: Secondary | ICD-10-CM | POA: Insufficient documentation

## 2024-01-30 DIAGNOSIS — W19XXXA Unspecified fall, initial encounter: Secondary | ICD-10-CM | POA: Insufficient documentation

## 2024-01-30 DIAGNOSIS — Z79899 Other long term (current) drug therapy: Secondary | ICD-10-CM | POA: Diagnosis not present

## 2024-01-30 MED ORDER — TRAMADOL HCL 50 MG PO TABS
50.0000 mg | ORAL_TABLET | Freq: Once | ORAL | Status: AC
  Administered 2024-01-30: 50 mg via ORAL
  Filled 2024-01-30: qty 1

## 2024-01-30 NOTE — ED Triage Notes (Signed)
 Pt states that she had fall last night. States that she hit her head and is having back pain. States that she got tangled up in her feet and she fell. States that she is on Eliquis .

## 2024-01-30 NOTE — ED Notes (Signed)
 Clean Xerform dressing applied to pt's left arm.

## 2024-01-30 NOTE — ED Notes (Signed)
 Pt. Has a knot with laceration on the top of her head from a fall that happened a day ago.

## 2024-01-30 NOTE — ED Provider Notes (Signed)
 [place] [facility] Provider Note   CSN: [id number] Arrival date & time: 01/30/24  1416     Patient presents with: Erin [name] is a 78 y.o. female with history of A-fib on Eliquis , hypertension, hypothyroidism, previous reported back surgeries, presents with concern for a mechanical fall that occurred yesterday evening.  She reports that she was getting up to get her medications, and got her foot stuck in the chair that she was in.  This caused her to fall onto her back and she hit the back of her head.  She denies any loss of consciousness.  Was able to get herself up without difficulty.  Denies any feelings of presyncope, chest pain, shortness of breath, or dizziness before the fall.  However, she reports ongoing pain to the back of her head and her mid to lower back.  Denies any vision changes, nausea or vomiting.  She denies any numbness or tingling in her upper or lower extremities.  Has been able to ambulate without difficulty today.    Fall Associated symptoms include headaches.       Prior to Admission medications  Medication Sig Start Date End Date Taking? Authorizing Provider  apixaban  (ELIQUIS ) 5 MG TABS tablet Take 1 tablet (5 mg total) by mouth 2 (two) times daily. 01/19/20   Delford Maude BROCKS, MD  atorvastatin  (LIPITOR ) 80 MG tablet Take 1 tablet (80 mg total) by mouth daily. 12/09/17   Army Katheryn BROCKS, NP  Biotin  5 MG TABS Take 5 mg by mouth every morning.    [provider]  butalbital -acetaminophen -caffeine  (FIORICET) 50-325-40 MG tablet Take 1 tablet by mouth as needed. 07/31/21   [provider]  CALCIUM  PO Take 40 mg by mouth daily at 2 am.    [provider]  cetirizine (ZYRTEC) 10 MG tablet Take 10 mg by mouth daily.    [provider]  cyclobenzaprine  (FLEXERIL ) 10 MG tablet Take 1 tablet (10 mg total) by mouth at bedtime. 09/11/21   Dow Arland BROCKS, NP  cycloSPORINE  (RESTASIS ) 0.05  % ophthalmic emulsion Place 1 drop into both eyes 2 (two) times daily.    [provider]  diltiazem  (CARDIZEM  CD) 300 MG 24 hr capsule Take 1 capsule (300 mg total) by mouth daily. 07/05/21   Nishan, Peter C, MD  dofetilide  (TIKOSYN ) 250 MCG capsule Take 1 capsule by mouth twice daily 05/07/23   Lesia Ozell Barter, PA-C  ELDERBERRY PO Take by mouth. Taking one chewable by mouth daily    [provider]  fluticasone (FLONASE) 50 MCG/ACT nasal spray Place 2 sprays into both nostrils daily. Patient taking differently: Place 2 sprays into both nostrils daily as needed.    [provider]  furosemide  (LASIX ) 40 MG tablet Take 40 mg by mouth daily.    [provider]  hydrALAZINE  (APRESOLINE ) 25 MG tablet Take 25 mg by mouth 2 (two) times daily.    [provider]  levothyroxine  (SYNTHROID ) 88 MCG tablet Take 88 mcg by mouth daily. 05/30/21   [provider]  Magnesium  Oxide, Antacid, 400 MG TABS Take 1 tablet by mouth daily. 08/08/21   [provider]  Multiple Vitamins-Minerals (MULTIVITAMIN ADULT) CHEW Taking one chewable by mouth daily    [provider]  pantoprazole  (PROTONIX ) 40 MG tablet Take 1 tablet (40 mg total) by mouth at bedtime. 07/31/16   Guido Hire, MD  spironolactone  (ALDACTONE ) 25 MG tablet Take 1 tablet by  mouth once daily 11/25/19   Gerhardt, Lori C, NP  zolpidem  (AMBIEN  CR) 6.25 MG CR tablet Take 6.25 mg by mouth at bedtime as needed. 08/03/21   [provider]    Allergies: Ace inhibitors, Fentanyl , Morphine  and codeine, and Codeine    Review of Systems  Musculoskeletal:  Positive for back pain.  Neurological:  Positive for headaches.    Updated Vital Signs BP (!) 147/79   Pulse 84   Temp 98 F (36.7 C)   Resp 17   Ht 5' 5 (1.651 m)   Wt 59 kg   LMP  (LMP Unknown)   SpO2 98%   BMI 21.63 kg/m   Physical Exam Vitals and nursing note reviewed.  Constitutional:      General: She is  not in acute distress.    Appearance: She is well-developed.  HENT:     Head: Normocephalic.     Comments: Hematoma to the posterior scalp.  No active bleeding from the site.  No lacerations Eyes:     Extraocular Movements: Extraocular movements intact.     Conjunctiva/sclera: Conjunctivae normal.     Pupils: Pupils are equal, round, and reactive to light.  Cardiovascular:     Rate and Rhythm: Normal rate and regular rhythm.     Heart sounds: No murmur heard.    Comments: 2+ radial and pedal pulses bilaterally Pulmonary:     Effort: Pulmonary effort is normal. No respiratory distress.     Breath sounds: Normal breath sounds.  Abdominal:     Palpations: Abdomen is soft.     Tenderness: There is no abdominal tenderness.  Musculoskeletal:        General: No swelling.     Cervical back: Neck supple.     Comments: General No obvious deformity.   Palpation Non-tender to palpation of the clavicles,humerus, radius and ulna, carpal bones, 1st-5th metacarpals and phalanges bilaterally Non tender over the femur, patella, tibia or fibula bilaterally  Nontender over the cervical spine.  Mild tenderness over the lower thoracic spine/upper lumbar spine.    Non-tender to palpation of the paraspinal region of the back or chest wall diffusely.  No tenderness over the rib cage diffusely  Tender over the left side of the pelvis.  ROM Full ROM of shoulders bilaterally Full elbow, wrist, knee flexion and extension bilaterally Intact plantarflexion and dorsiflexion, hip flexion bilaterally Ambulates without difficulty    Skin:    General: Skin is warm and dry.     Capillary Refill: Capillary refill takes less than 2 seconds.     Comments: Bruising to the posterior right shoulder  3 cm diameter skin tear to the left forearm.  Mild active bleeding.  No surrounding erythema, edema, or purulent drainage  Neurological:     General: No focal deficit present.     Mental Status: She is alert and  oriented to person, place, and time.     Comments: Intact sensation to the left and right upper and lower extremities  5/5 strength throughout the upper and lower extremities bilaterally  Psychiatric:        Mood and Affect: Mood normal.     (all labs ordered are listed, but only abnormal results are displayed) Labs Reviewed - No data to display  EKG: None  Procedures   Medications Ordered in the ED  traMADol  (ULTRAM ) tablet 50 mg (50 mg Oral Given 01/30/24 1800)    Clinical Course as of 01/30/24 1917  Thu Jan 30, 2024  1737 I was notified by radiology of the following finding on patient's head CT: IMPRESSION: 1. Approximately 3 mm thick acute extra-axial hemorrhage along the left parietal/occipital convexity. No mass effect.   [AF]    Clinical Course User Index [AF] Veta Palma, PA-C                                 Medical Decision Making Amount and/or Complexity of Data Reviewed Radiology: ordered.  Risk Prescription drug management.     Differential diagnosis includes but is not limited to intracranial hemorrhage, skull fracture, spinal injury, compression fracture, laceration, hematoma   ED Course:  Upon initial evaluation, patient is well-appearing, no acute distress.  Normal vital signs aside from a blood pressure of 148/90.  Reporting pain to the posterior aspect of her head.  On exam, she does have a hematoma noted to the left posterior aspect of her scalp.  No active bleeding from the site.  No overlying lacerations or abrasions.  She also has a skin tear to the right arm with mild bleeding. Nursing dressed this area with nonstick dressing, no skin in this area to suture back together.  Patient without any cervical spinal tenderness.  Does have lower thoracic spinal tenderness as well as upper lumbar tenderness on exam.  She is tender to the left pelvis.  Non-tender to the upper or lower extremities bilaterally.  Patient has good story for mechanical  fall that she got her foot stuck in the chair.  No indication for other labs at this time  Imaging Studies ordered: I ordered imaging studies including CT head, CT cervical spine, CT lumbar spine, x-ray pelvis I independently visualized the imaging with scope of interpretation limited to determining acute life threatening conditions related to emergency care. Imaging showed  CT head and cervical spine: IMPRESSION:  1. Approximately 3 mm thick acute extra-axial hemorrhage along the left  parietal/occipital convexity. No mass effect.  2. Right posterior scalp contusion.  3. No acute fracture or traumatic malalignment of the cervical spine.   CT lumbar spine: IMPRESSION:  1. Mild superior endplate compression fracture at T12, new from recent T-spine  CT.  2. Additional mild to moderate compression fractures, as above, favored to be  chronic.  3. T10-S1 posterior fusion hardware. Lucency around both S1 pedicle screws, new  from remote prior.  4. Infrarenal abdominal aortic aneurysm measuring 3.1 cm; recommend ultrasound  follow-up in 3 years.   X-ray pelvis without acute abnormality  I agree with the radiologist interpretation   Medications Given: Tramadol   Upon re-evaluation, patient remains well-appearing with stable vitals.  Reports pain better controlled with the tramadol  given earlier.  I discussed with patient that her lumbar spine CT showed a compression fracture at T12.  This does correspond with patient's area of pain on exam.  Patient does not have any neurologic deficits.  Pain well-controlled with tramadol . No indication for neurosurgery consult at this time. I discussed that we could obtain a brace for her, but she states she already has a brace at home from her previous spinal surgeries.  She feels that she can manage his pain at home.  Able to ambulate without difficulty. Patient's head CT did show an extra-axial hemorrhage along the left parietal/occipital scalp.  On exam,  this corresponds to the patient's hematoma noted.  There is no active bleeding from the site.  Does not appear to be rapidly expanding.  No  overlying lacerations.  Discussed this finding with my attending who does not recommend any interventions or further monitoring at this time. Discussed this with patient that this hematoma should start to resorb on its own within the next couple of weeks.  Encouraged her to use ice the area to help with pain and swelling.  Patient is stable and appropriate for discharge home  Impression: Mechanical fall Hematoma T12 compression fracture  Disposition:  The patient was discharged home with instructions to follow-up with her, or the spinal surgeons office listed in the discharge paperwork within the next 2 weeks for follow-up of her T12 compression fracture.  She is currently prescribed tramadol  at home from her pain clinic.  We discussed that she may continue to take the tramadol  as prescribed.  May use Tylenol  as needed for pain.  Ice the hematoma on her head to help with pain and swelling. Return precautions given and patient verbalized understanding.   This chart was dictated using voice recognition software, Dragon. Despite the best efforts of this provider to proofread and correct errors, errors may still occur which can change documentation meaning.       Final diagnoses:  Scalp hematoma, initial encounter  T12 compression fracture, initial encounter [facility])  Fall, initial encounter    ED Discharge Orders     None          Veta Palma, DEVONNA 01/30/24 1917    Darra Fonda MATSU, MD 01/31/24 1635

## 2024-01-30 NOTE — Discharge Instructions (Addendum)
 You have a collection of blood under the skin of the back of your head.  This should start to reabsorb on its own with time.  You may apply ice to the area to help with pain and swelling.  You are found to have a compression fracture of T12 today.  As discussed, you may continue to use the brace he already have at home if you find it is helpful for pain.  You may continue to take the tramadol  that is prescribed by her pain clinic for pain.  You may take up to 1000mg  of tylenol  every 6 hours as needed for pain.  Do not take more then 4g per day.  Please follow-up with your spine doctor, or the spine specialist listed below, for follow-up of your T12 compression fracture seen today within the next 2 weeks.  No changes noted on the imaging of your cervical spine or fractures noted in your pelvis.  Please return to the ER for any severe or sudden onset headaches, vision changes, persistent vomiting, numbness in your lower extremities, any other new or concerning symptoms

## 2024-02-08 ENCOUNTER — Other Ambulatory Visit: Payer: Self-pay | Admitting: Student
